# Patient Record
Sex: Female | Born: 1965
Health system: Southern US, Community
[De-identification: ages and names within clinical notes are randomized; demographics above are authoritative.]

## PROBLEM LIST (undated history)

## (undated) DIAGNOSIS — N1831 Chronic kidney disease, stage 3a: Secondary | ICD-10-CM

## (undated) DIAGNOSIS — I10 Essential (primary) hypertension: Secondary | ICD-10-CM

## (undated) DIAGNOSIS — R112 Nausea with vomiting, unspecified: Secondary | ICD-10-CM

## (undated) DIAGNOSIS — I480 Paroxysmal atrial fibrillation: Secondary | ICD-10-CM

## (undated) DIAGNOSIS — T8859XA Other complications of anesthesia, initial encounter: Secondary | ICD-10-CM

## (undated) DIAGNOSIS — J45909 Unspecified asthma, uncomplicated: Secondary | ICD-10-CM

## (undated) DIAGNOSIS — R809 Proteinuria, unspecified: Secondary | ICD-10-CM

## (undated) DIAGNOSIS — T4145XA Adverse effect of unspecified anesthetic, initial encounter: Secondary | ICD-10-CM

## (undated) DIAGNOSIS — E1129 Type 2 diabetes mellitus with other diabetic kidney complication: Secondary | ICD-10-CM

## (undated) DIAGNOSIS — E119 Type 2 diabetes mellitus without complications: Secondary | ICD-10-CM

## (undated) HISTORY — DX: Type 2 diabetes mellitus with other diabetic kidney complication: R80.9

## (undated) HISTORY — PX: EYE SURGERY: SHX253

## (undated) HISTORY — DX: Type 2 diabetes mellitus with other diabetic kidney complication: E11.29

## (undated) HISTORY — PX: KNEE ARTHROSCOPY: SUR90

## (undated) HISTORY — DX: Chronic kidney disease, stage 3a: N18.31

---

## 1999-02-26 ENCOUNTER — Emergency Department (HOSPITAL_COMMUNITY): Admission: EM | Admit: 1999-02-26 | Discharge: 1999-02-26 | Payer: Self-pay | Admitting: Emergency Medicine

## 1999-08-05 ENCOUNTER — Encounter: Payer: Self-pay | Admitting: Emergency Medicine

## 1999-08-05 ENCOUNTER — Emergency Department (HOSPITAL_COMMUNITY): Admission: EM | Admit: 1999-08-05 | Discharge: 1999-08-05 | Payer: Self-pay | Admitting: Emergency Medicine

## 1999-11-09 ENCOUNTER — Encounter: Payer: Self-pay | Admitting: Internal Medicine

## 1999-11-09 ENCOUNTER — Inpatient Hospital Stay (HOSPITAL_COMMUNITY): Admission: EM | Admit: 1999-11-09 | Discharge: 1999-11-10 | Payer: Self-pay | Admitting: Emergency Medicine

## 1999-11-09 ENCOUNTER — Encounter: Payer: Self-pay | Admitting: Emergency Medicine

## 2000-09-23 ENCOUNTER — Other Ambulatory Visit: Admission: RE | Admit: 2000-09-23 | Discharge: 2000-09-23 | Payer: Self-pay | Admitting: Obstetrics and Gynecology

## 2001-02-21 ENCOUNTER — Inpatient Hospital Stay (HOSPITAL_COMMUNITY): Admission: AD | Admit: 2001-02-21 | Discharge: 2001-02-21 | Payer: Self-pay | Admitting: Obstetrics and Gynecology

## 2001-04-06 HISTORY — PX: TUBAL LIGATION: SHX77

## 2001-06-22 ENCOUNTER — Inpatient Hospital Stay (HOSPITAL_COMMUNITY): Admission: AD | Admit: 2001-06-22 | Discharge: 2001-06-25 | Payer: Self-pay | Admitting: Obstetrics and Gynecology

## 2001-06-23 ENCOUNTER — Encounter: Payer: Self-pay | Admitting: Obstetrics and Gynecology

## 2001-07-12 ENCOUNTER — Encounter: Payer: Self-pay | Admitting: Obstetrics and Gynecology

## 2001-07-12 ENCOUNTER — Encounter (HOSPITAL_COMMUNITY): Admission: RE | Admit: 2001-07-12 | Discharge: 2001-07-12 | Payer: Self-pay | Admitting: Obstetrics and Gynecology

## 2001-07-19 ENCOUNTER — Inpatient Hospital Stay (HOSPITAL_COMMUNITY): Admission: AD | Admit: 2001-07-19 | Discharge: 2001-07-22 | Payer: Self-pay | Admitting: Obstetrics and Gynecology

## 2001-08-25 ENCOUNTER — Other Ambulatory Visit: Admission: RE | Admit: 2001-08-25 | Discharge: 2001-08-25 | Payer: Self-pay | Admitting: Obstetrics and Gynecology

## 2007-04-22 ENCOUNTER — Ambulatory Visit (HOSPITAL_BASED_OUTPATIENT_CLINIC_OR_DEPARTMENT_OTHER): Admission: RE | Admit: 2007-04-22 | Discharge: 2007-04-22 | Payer: Self-pay | Admitting: Orthopedic Surgery

## 2009-05-28 ENCOUNTER — Emergency Department (HOSPITAL_COMMUNITY): Admission: EM | Admit: 2009-05-28 | Discharge: 2009-05-28 | Payer: Self-pay | Admitting: Emergency Medicine

## 2010-06-25 LAB — POCT CARDIAC MARKERS
CKMB, poc: <1 ng/mL — ABNORMAL LOW (ref 1.0–8.0)
CKMB, poc: <1 ng/mL — ABNORMAL LOW (ref 1.0–8.0)
Myoglobin, poc: 24.3 ng/mL (ref 12–200)
Myoglobin, poc: 41.3 ng/mL (ref 12–200)
Troponin i, poc: <0.05 ng/mL (ref 0.00–0.09)
Troponin i, poc: <0.05 ng/mL (ref 0.00–0.09)

## 2010-06-25 LAB — D-DIMER, QUANTITATIVE: D-Dimer, Quant: <0.22 ug/mL-FEU (ref 0.00–0.48)

## 2010-08-19 NOTE — Op Note (Signed)
Dawn Braun, Dawn Braun           ACCOUNT NO.:  [id number]   MEDICAL RECORD NO.:  [id number]          PATIENT TYPE:  AMB   LOCATION:  DSC                          FACILITY:  [facility]   PHYSICIAN:  Harvie Junior, M.D.   DATE OF BIRTH:  01-Nov-1965   DATE OF PROCEDURE:  04/22/2007  DATE OF DISCHARGE:                               OPERATIVE REPORT   PREOPERATIVE DIAGNOSIS:  Medial meniscal tear with suspected tight  lateral retinaculum.   POSTOPERATIVE DIAGNOSES:  1. Medial meniscal tear with suspected tight lateral retinaculum.  2. Chondromalacia, lateral patellar facet.  3. Medial plica.   PROCEDURE:  1. Partial posterior horn medial meniscectomy with corresponding      debridement in the medial femoral compartment.  2. Chondroplasty of the lateral patellar facet.  3. Lateral retinacular release.  4. Debridement of medial shelf plica.   SURGEON:  Harvie Junior, M.D.   ASSISTANT:  Marshia Ly, P.A.   ANESTHESIA:  General.   BRIEF HISTORY:  Ms. Corkern is a 45 year old female with a long history  of having had significant right knee pain.  We had treated her  conservatively for a prolonged period of time because of failure of all  conservative care.  She is ultimately taken to the operating room for  operative knee arthroscopy, debridement of meniscal tear, and other as  needed.   PROCEDURE:  Patient was taken to the operating room.  After adequate  anesthesia was obtained with general anesthetic, the patient was placed  supine on the operating room table.  The right leg was prepped and  draped in the usual sterile fashion.  Following that, routine  arthroscopic examination of the knee revealed there was an obvious  posterior horn medial meniscal tear which was debrided back to a smooth  and stable rim.  Attention was turned to the medial femoral condyle,  where there were grade 2 changes in the medial femoral condyle, which  were debrided in that compartment.  Attention  was turned up to the  patellofemoral joint.  There was definitely lateral patellar tracking,  but the mid portion of the patella looked pretty good.  The lateral  retinaculum was tight, and we did a lateral retinacular release.  This  gave a little better access to the lateral patellar facet, which did  have chondromalacia, which was debrided.  There was a large medial shelf  plica which was debrided in the medial compartment, and the lateral  compartment was pristine, ACL pristine.  At this point, the knee was  copiously and thoroughly irrigated and suctioned dry.  The arthroscopic  portals were closed to the bandage.  Sterile compressive dressing was  applied.  The patient was taken to the recovery room where she noted to  be in satisfactory condition.  During the procedure, a lateral  retinacular release was performed with the arthroscopic Bovie from three  finger breadths proximal to the patella down to the joint line.  Again,  as described, the knee was thoroughly irrigated and  suctioned dry.  The arthroscopic portals were closed with a bandage, and  sterile compressive dressing was  applied.  Patient was taken to the  recovery room and noted to be in satisfactory condition.  Estimated  blood loss for the procedure was __________ .      Harvie Junior, M.D.  Electronically Signed     JLG/MEDQ  D:  04/22/2007  T:  04/22/2007  Job:  [id number]

## 2010-12-25 LAB — BASIC METABOLIC PANEL
BUN: 9
CO2: 22
Calcium: 8.7
Chloride: 104
Creatinine, Ser: 0.73
GFR calc Af Amer: >60
GFR calc non Af Amer: >60
Glucose, Bld: 207 — ABNORMAL HIGH
Potassium: 4.2
Sodium: 134 — ABNORMAL LOW

## 2010-12-25 LAB — POCT HEMOGLOBIN-HEMACUE: Hemoglobin: 12.4

## 2015-05-09 ENCOUNTER — Emergency Department (HOSPITAL_COMMUNITY): Payer: 59

## 2015-05-09 ENCOUNTER — Other Ambulatory Visit: Payer: Self-pay

## 2015-05-09 ENCOUNTER — Encounter (HOSPITAL_COMMUNITY): Payer: Self-pay | Admitting: Emergency Medicine

## 2015-05-09 ENCOUNTER — Emergency Department (HOSPITAL_COMMUNITY)
Admission: EM | Admit: 2015-05-09 | Discharge: 2015-05-09 | Disposition: A | Discharge status: Home / Self Care | Payer: 59 | Attending: Emergency Medicine | Admitting: Emergency Medicine

## 2015-05-09 DIAGNOSIS — R0602 Shortness of breath: Secondary | ICD-10-CM | POA: Diagnosis present

## 2015-05-09 DIAGNOSIS — I4891 Unspecified atrial fibrillation: Secondary | ICD-10-CM | POA: Diagnosis not present

## 2015-05-09 DIAGNOSIS — R55 Syncope and collapse: Secondary | ICD-10-CM | POA: Diagnosis not present

## 2015-05-09 DIAGNOSIS — E119 Type 2 diabetes mellitus without complications: Secondary | ICD-10-CM | POA: Diagnosis not present

## 2015-05-09 DIAGNOSIS — Z7984 Long term (current) use of oral hypoglycemic drugs: Secondary | ICD-10-CM | POA: Insufficient documentation

## 2015-05-09 DIAGNOSIS — J45901 Unspecified asthma with (acute) exacerbation: Secondary | ICD-10-CM | POA: Insufficient documentation

## 2015-05-09 HISTORY — DX: Unspecified asthma, uncomplicated: J45.909

## 2015-05-09 LAB — CBC
HCT: 38.5 % (ref 36.0–46.0)
Hemoglobin: 13.1 g/dL (ref 12.0–15.0)
MCH: 29.2 pg (ref 26.0–34.0)
MCHC: 34 g/dL (ref 30.0–36.0)
MCV: 85.7 fL (ref 78.0–100.0)
Platelets: 330 10*3/uL (ref 150–400)
RBC: 4.49 MIL/uL (ref 3.87–5.11)
RDW: 12.9 % (ref 11.5–15.5)
WBC: 8.6 10*3/uL (ref 4.0–10.5)

## 2015-05-09 LAB — BASIC METABOLIC PANEL
Anion gap: 9 (ref 5–15)
BUN: 19 mg/dL (ref 6–20)
CO2: 20 mmol/L — ABNORMAL LOW (ref 22–32)
Calcium: 8.7 mg/dL — ABNORMAL LOW (ref 8.9–10.3)
Chloride: 110 mmol/L (ref 101–111)
Creatinine, Ser: 0.69 mg/dL (ref 0.44–1.00)
GFR calc Af Amer: >60 mL/min (ref >60)
GFR calc non Af Amer: >60 mL/min (ref >60)
Glucose, Bld: 210 mg/dL — ABNORMAL HIGH (ref 65–99)
Potassium: 4.1 mmol/L (ref 3.5–5.1)
Sodium: 139 mmol/L (ref 135–145)

## 2015-05-09 LAB — I-STAT TROPONIN, ED: Troponin i, poc: 0.03 ng/mL (ref 0.00–0.08)

## 2015-05-09 LAB — PROTIME-INR
INR: 1.01 (ref 0.00–1.49)
Prothrombin Time: 13.5 seconds (ref 11.6–15.2)

## 2015-05-09 LAB — MAGNESIUM: Magnesium: 1.7 mg/dL (ref 1.7–2.4)

## 2015-05-09 MED ORDER — DILTIAZEM HCL ER COATED BEADS 120 MG PO CP24: 120.0000 mg | ORAL_CAPSULE | Freq: Every day | ORAL | Status: DC

## 2015-05-09 MED ORDER — DILTIAZEM HCL 25 MG/5ML IV SOLN
10.0000 mg | Freq: Once | INTRAVENOUS | Status: AC
  Administered 2015-05-09: 10 mg via INTRAVENOUS
  Filled 2015-05-09: qty 5

## 2015-05-09 MED ORDER — APIXABAN 5 MG PO TABS: 5.0000 mg | ORAL_TABLET | Freq: Two times a day (BID) | ORAL | Status: DC

## 2015-05-09 NOTE — ED Provider Notes (Signed)
CSN: [id number]     Arrival date & time 05/09/15  4098 History   First MD Initiated Contact with Patient 05/09/15 [phone]     Chief Complaint  Patient presents with  . Shortness of Breath  . Near Syncope    HPI  patient presents to the emergency room for evaluation of palpitations and shortness of breath.   Patient states she noted the symptoms this morning. It woke her up from sleep. He felt like her heart was racing and was irregular. She also was feeling lightheaded and short of breath.   She had a similar event in the past that resolved on its own so she never was evaluated for it.   Patient denies any trouble with fevers or coughing. No history of heart or lung problems. Past Medical History  Diagnosis Date  . Diabetes mellitus without complication (HCC)     type 2  . Asthma    History reviewed. No pertinent past surgical history. History reviewed. No pertinent family history. Social History  Substance Use Topics  . Smoking status: Never Smoker   . Smokeless tobacco: None  . Alcohol Use: Yes     Comment: occasional   OB History    No data available     Review of Systems  All other systems reviewed and are negative.     Allergies  Review of patient's allergies indicates no known allergies.  Home Medications   Prior to Admission medications   Medication Sig Start Date End Date Taking? Authorizing Provider  sitaGLIPtin-metformin (JANUMET) 50-500 MG tablet Take 1 tablet by mouth daily.   Yes Historical Provider, MD   BP 116/69 mmHg  Pulse 116  Temp(Src) 97.4 F (36.3 C) (Oral)  Resp 18  SpO2 100%  LMP 04/25/2015 Physical Exam  Constitutional: She appears well-developed and well-nourished. No distress.  HENT:  Head: Normocephalic and atraumatic.  Right Ear: External ear normal.  Left Ear: External ear normal.  Eyes: Conjunctivae are normal. Right eye exhibits no discharge. Left eye exhibits no discharge. No scleral icterus.  Neck: Neck supple. No tracheal  deviation present.  Cardiovascular: Intact distal pulses.  An irregularly irregular rhythm present. Tachycardia present.   Pulmonary/Chest: Effort normal and breath sounds normal. No stridor. No respiratory distress. She has no wheezes. She has no rales.  Abdominal: Soft. Bowel sounds are normal. She exhibits no distension. There is no tenderness. There is no rebound and no guarding.  Musculoskeletal: She exhibits no edema or tenderness.  Neurological: She is alert. She has normal strength. No cranial nerve deficit (no facial droop, extraocular movements intact, no slurred speech) or sensory deficit. She exhibits normal muscle tone. She displays no seizure activity. Coordination normal.  Skin: Skin is warm and dry. No rash noted.  Psychiatric: She has a normal mood and affect.  Nursing note and vitals reviewed.   ED Course  Procedures (including critical care time) Labs Review Labs Reviewed  BASIC METABOLIC PANEL - Abnormal; Notable for the following:    CO2 20 (*)    Glucose, Bld 210 (*)    Calcium 8.7 (*)    All other components within normal limits  CBC  PROTIME-INR  MAGNESIUM  I-STAT TROPOININ, ED    Imaging Review Dg Chest 2 View  05/09/2015  CLINICAL DATA:  New onset dizziness. Shortness of breath. Cardiac palpitations. EXAM: CHEST  2 VIEW COMPARISON:  05/28/2009 FINDINGS: Mild enlargement of the cardiopericardial silhouette with cardiothoracic index 55%. No edema. Airway thickening is present, suggesting bronchitis  or reactive airways disease. No airspace opacity or pleural effusion. IMPRESSION: 1. Airway thickening is present, suggesting bronchitis or reactive airways disease. 2. Mild enlargement of the cardiopericardial silhouette, without edema. Electronically Signed   By: Gaylyn Rong M.D.   On: 05/09/2015 09:43   I have personally reviewed and evaluated these images and lab results as part of my medical decision-making.   EKG Interpretation   Date/Time:  Thursday  May 09 2015 09:25:48 EST Ventricular Rate:  127 PR Interval:    QRS Duration: 82 QT Interval:  324 QTC Calculation: 471 R Axis:   114 Text Interpretation:  Atrial fibrillation Ventricular premature complex  Right axis deviation Low voltage, precordial leads atrial fibrillation is  new from prior tracing Confirmed by Coury Grieger  MD-J, Mareon Robinette ([id number]) on 05/09/2015  10:37:15 AM     Medications  diltiazem (CARDIZEM) injection 10 mg (10 mg Intravenous Given 05/09/15 1057)   Filed Vitals:   05/09/15 1130 05/09/15 1200  BP: 116/98 118/93  Pulse: 66 81  Temp:    Resp: 7 14    MDM   Final diagnoses:  Atrial fibrillation, unspecified type (HCC)    Pt has new onset a fib.  Chads2Vasc2 = 2. D/w Dr Eldridge Dace.  OK for outpatient follow in a fib clinic.   Start cardizem cd 120 mg.  Start eliquis 5 mg BID   discussed findings and plan with the patient. She would follow up at the [facility] clinic.    Linwood Dibbles, MD 05/09/15 [phone]

## 2015-05-09 NOTE — Discharge Instructions (Signed)
Atrial Fibrillation °Atrial fibrillation is a type of heartbeat that is irregular or fast (rapid). If you have this condition, your heart keeps quivering in a weird (chaotic) way. This condition can make it so your heart cannot pump blood normally. Having this condition gives a person more risk for stroke, heart failure, and other heart problems. There are different types of atrial fibrillation. Talk with your doctor to learn about the type that you have. °HOME CARE °· Take over-the-counter and prescription medicines only as told by your doctor. °· If your doctor prescribed a blood-thinning medicine, take it exactly as told. Taking too much of it can cause bleeding. If you do not take enough of it, you will not have the protection that you need against stroke and other problems. °· Do not use any tobacco products. These include cigarettes, chewing tobacco, and e-cigarettes. If you need help quitting, ask your doctor. °· If you have apnea (obstructive sleep apnea), manage it as told by your doctor. °· Do not drink alcohol. °· Do not drink beverages that have caffeine. These include coffee, soda, and tea. °· Maintain a healthy weight. Do not use diet pills unless your doctor says they are safe for you. Diet pills may make heart problems worse. °· Follow diet instructions as told by your doctor. °· Exercise regularly as told by your doctor. °· Keep all follow-up visits as told by your doctor. This is important. °GET HELP IF: °· You notice a change in the speed, rhythm, or strength of your heartbeat. °· You are taking a blood-thinning medicine and you notice more bruising. °· You get tired more easily when you move or exercise. °GET HELP RIGHT AWAY IF: °· You have pain in your chest or your belly (abdomen). °· You have sweating or weakness. °· You feel sick to your stomach (nauseous). °· You notice blood in your throw up (vomit), poop (stool), or pee (urine). °· You are short of breath. °· You suddenly have swollen feet  and ankles. °· You feel dizzy. °· Your suddenly get weak or numb in your face, arms, or legs, especially if it happens on one side of your body. °· You have trouble talking, trouble understanding, or both. °· Your face or your eyelid droops on one side. °These symptoms may be an emergency. Do not wait to see if the symptoms will go away. Get medical help right away. Call your local emergency services (911 in the [place] Do not drive yourself to the hospital. °  °This information is not intended to replace advice given to you by your health care provider. Make sure you discuss any questions you have with your health care provider. °  °Document Released: 12/31/2007 Document Revised: 12/12/2014 Document Reviewed: 07/18/2014 °Elsevier Interactive Patient Education ©2016 [employer] ° °

## 2015-05-09 NOTE — ED Notes (Signed)
Pt c/o dizziness, SOB, chest palpitations onset this morning. Pt is laboring to breath. Lung sounds clear.

## 2015-05-09 NOTE — ED Notes (Signed)
Patient transported to X-ray 

## 2015-05-10 ENCOUNTER — Telehealth (HOSPITAL_COMMUNITY): Payer: Self-pay | Admitting: *Deleted

## 2015-05-10 NOTE — Telephone Encounter (Signed)
Multiple attempts made to contact patient regarding follow up appointment from ER. No voicemail available to leave message. Will continue to try and contact patient for appropriate follow up.

## 2015-05-14 ENCOUNTER — Ambulatory Visit (HOSPITAL_COMMUNITY)
Admission: RE | Admit: 2015-05-14 | Discharge: 2015-05-14 | Disposition: A | Discharge status: Home / Self Care | Payer: 59 | Source: Ambulatory Visit | Attending: Nurse Practitioner | Admitting: Nurse Practitioner

## 2015-05-14 VITALS — BP 120/80 | HR 72

## 2015-05-14 DIAGNOSIS — E119 Type 2 diabetes mellitus without complications: Secondary | ICD-10-CM | POA: Insufficient documentation

## 2015-05-14 DIAGNOSIS — Z7901 Long term (current) use of anticoagulants: Secondary | ICD-10-CM | POA: Insufficient documentation

## 2015-05-14 DIAGNOSIS — I480 Paroxysmal atrial fibrillation: Secondary | ICD-10-CM | POA: Diagnosis not present

## 2015-05-14 DIAGNOSIS — J45909 Unspecified asthma, uncomplicated: Secondary | ICD-10-CM | POA: Diagnosis not present

## 2015-05-14 MED ORDER — APIXABAN 5 MG PO TABS: 5.0000 mg | ORAL_TABLET | Freq: Two times a day (BID) | ORAL | Status: DC

## 2015-05-14 MED ORDER — DILTIAZEM HCL ER COATED BEADS 120 MG PO CP24: 120.0000 mg | ORAL_CAPSULE | Freq: Every day | ORAL | Status: DC

## 2015-05-15 ENCOUNTER — Encounter (HOSPITAL_COMMUNITY): Payer: Self-pay | Admitting: Nurse Practitioner

## 2015-05-15 NOTE — Progress Notes (Signed)
Patient ID: Dawn Braun, female   DOB: [birth year], 50 y.o.   MRN: [id number]     Primary Care Physician: No primary care provider on file. Referring Physician: ER f/u   Dawn Braun is a 50 y.o. female with a h/o PAF that newly diagnosed in the ER 2/2. She has had two previous epiosed of fast heart bet associated with shortness of breath and fatigue over the last few months. She converted with IV cardizem and d/c home on 120 mg cardizem daily and  apixaban for chadsvasc score of 2(female and DM).  She has not had reoccurrence of afib. She is being complinat with cardizem and apixaban. She does not drink alcohol, cutting down on caffeine use, no tobacco, recent weight gain from diabetes meds around 6 lbs, but DM meds just adjusted. She does snore and wakes up with morning headaches and has daytime somnolence so will order sleep study. No regular exercise program.  Today, she denies symptoms of palpitations, chest pain, shortness of breath, orthopnea, PND, lower extremity edema, dizziness, presyncope, syncope, or neurologic sequela. The patient is tolerating medications without difficulties and is otherwise without complaint today.   Past Medical History  Diagnosis Date  . Diabetes mellitus without complication (HCC)     type 2  . Asthma    No past surgical history on file.  Current Outpatient Prescriptions  Medication Sig Dispense Refill  . apixaban (ELIQUIS) 5 MG TABS tablet Take 1 tablet (5 mg total) by mouth 2 (two) times daily. 60 tablet 6  . diltiazem (CARDIZEM CD) 120 MG 24 hr capsule Take 1 capsule (120 mg total) by mouth daily. 30 capsule 6  . glimepiride (AMARYL) 2 MG tablet Take 2 mg by mouth daily with breakfast.    . linagliptin (TRADJENTA) 5 MG TABS tablet Take 5 mg by mouth daily.     No current facility-administered medications for this encounter.    No Known Allergies  Social History   Social History  . Marital Status: Single    Spouse Name: N/A  .  Number of Children: N/A  . Years of Education: N/A   Occupational History  . Not on file.   Social History Main Topics  . Smoking status: Never Smoker   . Smokeless tobacco: Not on file  . Alcohol Use: Yes     Comment: occasional  . Drug Use: No  . Sexual Activity: Not on file   Other Topics Concern  . Not on file   Social History Narrative    No family history on file.  ROS- All systems are reviewed and negative except as per the HPI above  Physical Exam: Filed Vitals:   05/14/15 0930 05/15/15 0714  BP: 120/80   Pulse: 72 72    GEN- The patient is well appearing, alert and oriented x 3 today.   Head- normocephalic, atraumatic Eyes-  Sclera clear, conjunctiva pink Ears- hearing intact Oropharynx- clear Neck- supple, no JVP Lymph- no cervical lymphadenopathy Lungs- Clear to ausculation bilaterally, normal work of breathing Heart- Regular rate and rhythm, no murmurs, rubs or gallops, PMI not laterally displaced GI- soft, NT, ND, + BS Extremities- no clubbing, cyanosis, or edema MS- no significant deformity or atrophy Skin- no rash or lesion Psych- euthymic mood, full affect Neuro- strength and sensation are intact  EKG- NSR at 72 bpm, Pr int 156 ms, Qrs int 76 ms, Qtc 429 ms Epic records reviewed  Assessment and Plan: 1. PAF Continue cardizem Continue apixaban  2. Lifestyle risk for afib Snoring-Sleep study ordered Develop regular exercise program Aim for 5 to 10% weight loss  F/u in one month Consider obtaining echo on recheck  Lupita Leash [facility] [address] [place], [place] [address] [phone]

## 2015-05-20 ENCOUNTER — Encounter: Payer: Self-pay | Admitting: *Deleted

## 2015-05-20 ENCOUNTER — Telehealth: Payer: Self-pay | Admitting: *Deleted

## 2015-05-20 NOTE — Telephone Encounter (Signed)
Precert Started for Sleep Study    Ref # [id number]

## 2015-06-03 ENCOUNTER — Other Ambulatory Visit: Payer: Self-pay | Admitting: *Deleted

## 2015-06-03 ENCOUNTER — Other Ambulatory Visit (HOSPITAL_COMMUNITY): Payer: Self-pay | Admitting: *Deleted

## 2015-06-03 DIAGNOSIS — I480 Paroxysmal atrial fibrillation: Secondary | ICD-10-CM

## 2015-06-03 DIAGNOSIS — R0683 Snoring: Secondary | ICD-10-CM

## 2015-06-03 MED ORDER — DILTIAZEM HCL ER COATED BEADS 120 MG PO CP24: 120.0000 mg | ORAL_CAPSULE | Freq: Every day | ORAL | Status: DC

## 2015-06-03 NOTE — Telephone Encounter (Signed)
Pt called in for refills on cardizem because she was about out and mentioned that she had plenty of eliquis because she was only taking it once a day due to feeling "floaty". Educated patient on correct directions of taking Eliquis otherwise she is not being protected from stroke if only taking once a day.  Instructed patient to try and take cardizem at bedtime instead of morning and see if that helps with her symptoms but if they continue to call back and we may need to make adjustments to medications. Patient verbalized understanding.

## 2015-06-10 ENCOUNTER — Inpatient Hospital Stay (HOSPITAL_COMMUNITY): Admission: RE | Admit: 2015-06-10 | Payer: 59 | Source: Ambulatory Visit | Admitting: Nurse Practitioner

## 2015-06-13 ENCOUNTER — Ambulatory Visit (HOSPITAL_COMMUNITY)
Admission: RE | Admit: 2015-06-13 | Discharge: 2015-06-13 | Disposition: A | Discharge status: Home / Self Care | Payer: 59 | Source: Ambulatory Visit | Attending: Nurse Practitioner | Admitting: Nurse Practitioner

## 2015-06-13 VITALS — BP 126/72 | HR 75 | Ht 63.0 in | Wt 192.2 lb

## 2015-06-13 DIAGNOSIS — Z7901 Long term (current) use of anticoagulants: Secondary | ICD-10-CM | POA: Diagnosis not present

## 2015-06-13 DIAGNOSIS — Z79899 Other long term (current) drug therapy: Secondary | ICD-10-CM | POA: Insufficient documentation

## 2015-06-13 DIAGNOSIS — I480 Paroxysmal atrial fibrillation: Secondary | ICD-10-CM

## 2015-06-13 DIAGNOSIS — E119 Type 2 diabetes mellitus without complications: Secondary | ICD-10-CM | POA: Insufficient documentation

## 2015-06-13 DIAGNOSIS — Z7984 Long term (current) use of oral hypoglycemic drugs: Secondary | ICD-10-CM | POA: Diagnosis not present

## 2015-06-13 DIAGNOSIS — R0683 Snoring: Secondary | ICD-10-CM | POA: Insufficient documentation

## 2015-06-13 DIAGNOSIS — I4891 Unspecified atrial fibrillation: Secondary | ICD-10-CM | POA: Diagnosis present

## 2015-06-13 NOTE — Progress Notes (Signed)
Patient ID: Dawn Braun, female   DOB: 06-26-1965, 50 y.o.   MRN: [id number]     Primary Care Physician: No primary care provider on file. Referring Physician: ER f/u   Dawn Braun is a 50 y.o. female with a h/o PAF that newly diagnosed in the ER 2/2. She has had two previous episodes of fast heart beat associated with shortness of breath and fatigue over the last few months. She converted with IV cardizem and d/c home on 120 mg cardizem daily and  apixaban for chadsvasc score of 2(female and DM).  She has not had reoccurrence of afib. She is being complinat with cardizem and apixaban. She does not drink alcohol, cutting down on caffeine use, no tobacco, recent weight gain from diabetes meds around 6 lbs, but DM meds just adjusted. She does snore and wakes up with morning headaches and has daytime somnolence so will order sleep study. No regular exercise program.  Returns 3/9. She is doing well. No further afib. She is tolerating blood thinner, no bleeding issues, has noted that she is more cold now on blood thinner.Sleep study is pending early May for snoring history.   Today, she denies symptoms of palpitations, chest pain, shortness of breath, orthopnea, PND, lower extremity edema, dizziness, presyncope, syncope, or neurologic sequela. The patient is tolerating medications without difficulties and is otherwise without complaint today.   Past Medical History  Diagnosis Date  . Diabetes mellitus without complication (HCC)     type 2  . Asthma    No past surgical history on file.  Current Outpatient Prescriptions  Medication Sig Dispense Refill  . apixaban (ELIQUIS) 5 MG TABS tablet Take 1 tablet (5 mg total) by mouth 2 (two) times daily. 60 tablet 6  . diltiazem (CARDIZEM CD) 120 MG 24 hr capsule Take 1 capsule (120 mg total) by mouth daily. 30 capsule 6  . glimepiride (AMARYL) 2 MG tablet Take 2 mg by mouth daily with breakfast.    . linagliptin (TRADJENTA) 5 MG TABS  tablet Take 5 mg by mouth daily.     No current facility-administered medications for this encounter.    No Known Allergies  Social History   Social History  . Marital Status: Single    Spouse Name: N/A  . Number of Children: N/A  . Years of Education: N/A   Occupational History  . Not on file.   Social History Main Topics  . Smoking status: Never Smoker   . Smokeless tobacco: Not on file  . Alcohol Use: Yes     Comment: occasional  . Drug Use: No  . Sexual Activity: Not on file   Other Topics Concern  . Not on file   Social History Narrative    No family history on file.  ROS- All systems are reviewed and negative except as per the HPI above  Physical Exam: Filed Vitals:   06/13/15 0848  BP: 126/72  Pulse: 75  Height: 5\' 3"  (1.6 m)  Weight: 192 lb 3.2 oz (87.181 kg)    GEN- The patient is well appearing, alert and oriented x 3 today.   Head- normocephalic, atraumatic Eyes-  Sclera clear, conjunctiva pink Ears- hearing intact Oropharynx- clear Neck- supple, no JVP Lymph- no cervical lymphadenopathy Lungs- Clear to ausculation bilaterally, normal work of breathing Heart- Regular rate and rhythm, no murmurs, rubs or gallops, PMI not laterally displaced GI- soft, NT, ND, + BS Extremities- no clubbing, cyanosis, or edema MS- no significant deformity or  atrophy Skin- no rash or lesion Psych- euthymic mood, full affect Neuro- strength and sensation are intact  EKG- NSR with rare PVC, LAFB,  pr int 162 ms, qrs int 78 ms, qtc 437 ms Epic records reviewed  Assessment and Plan: 1. PAF Continue cardizem Continue apixaban  2. Lifestyle risk for afib Snoring-Sleep study ordered Develop regular exercise program Aim for 5 to 10% weight loss  Will determine f/u when sleep study results are reviewed  Lupita Leash [facility] [address] [place], [place] [address] [phone]

## 2015-07-01 ENCOUNTER — Ambulatory Visit (HOSPITAL_COMMUNITY)
Admission: RE | Admit: 2015-07-01 | Discharge: 2015-07-01 | Disposition: A | Discharge status: Home / Self Care | Payer: 59 | Source: Ambulatory Visit | Attending: Nurse Practitioner | Admitting: Nurse Practitioner

## 2015-07-01 ENCOUNTER — Encounter (HOSPITAL_COMMUNITY): Payer: Self-pay | Admitting: Nurse Practitioner

## 2015-07-01 ENCOUNTER — Telehealth (HOSPITAL_COMMUNITY): Payer: Self-pay | Admitting: *Deleted

## 2015-07-01 VITALS — BP 100/62 | HR 89

## 2015-07-01 DIAGNOSIS — Z79899 Other long term (current) drug therapy: Secondary | ICD-10-CM | POA: Diagnosis not present

## 2015-07-01 DIAGNOSIS — Z7901 Long term (current) use of anticoagulants: Secondary | ICD-10-CM | POA: Diagnosis not present

## 2015-07-01 DIAGNOSIS — I480 Paroxysmal atrial fibrillation: Secondary | ICD-10-CM

## 2015-07-01 DIAGNOSIS — Z7984 Long term (current) use of oral hypoglycemic drugs: Secondary | ICD-10-CM | POA: Diagnosis not present

## 2015-07-01 DIAGNOSIS — R0683 Snoring: Secondary | ICD-10-CM | POA: Insufficient documentation

## 2015-07-01 DIAGNOSIS — J45909 Unspecified asthma, uncomplicated: Secondary | ICD-10-CM | POA: Diagnosis not present

## 2015-07-01 DIAGNOSIS — E119 Type 2 diabetes mellitus without complications: Secondary | ICD-10-CM | POA: Diagnosis not present

## 2015-07-01 LAB — COMPREHENSIVE METABOLIC PANEL
ALT: 19 U/L (ref 14–54)
AST: 20 U/L (ref 15–41)
Albumin: 3.1 g/dL — ABNORMAL LOW (ref 3.5–5.0)
Alkaline Phosphatase: 88 U/L (ref 38–126)
Anion gap: 7 (ref 5–15)
BUN: 24 mg/dL — ABNORMAL HIGH (ref 6–20)
CO2: 24 mmol/L (ref 22–32)
Calcium: 9.2 mg/dL (ref 8.9–10.3)
Chloride: 109 mmol/L (ref 101–111)
Creatinine, Ser: 0.77 mg/dL (ref 0.44–1.00)
GFR calc Af Amer: >60 mL/min (ref >60)
GFR calc non Af Amer: >60 mL/min (ref >60)
Glucose, Bld: 190 mg/dL — ABNORMAL HIGH (ref 65–99)
Potassium: 4.1 mmol/L (ref 3.5–5.1)
Sodium: 140 mmol/L (ref 135–145)
Total Bilirubin: 0.5 mg/dL (ref 0.3–1.2)
Total Protein: 7.1 g/dL (ref 6.5–8.1)

## 2015-07-01 LAB — MAGNESIUM: Magnesium: 1.8 mg/dL (ref 1.7–2.4)

## 2015-07-01 LAB — TSH: TSH: 0.863 u[IU]/mL (ref 0.350–4.500)

## 2015-07-01 MED ORDER — DILTIAZEM HCL 30 MG PO TABS: ORAL_TABLET | ORAL | Status: DC

## 2015-07-01 NOTE — Patient Instructions (Signed)
Your physician has recommended you make the following change in your medication:  Cardizem 30mg  -- take 1 tablet by mouth every 4 hours AS NEEDED for heart rate >100 as long as blood pressure >100.   Kennyth ArnoldStacy will call to schedule echocardiogram and follow up appt

## 2015-07-01 NOTE — Progress Notes (Signed)
Patient ID: Dawn ComaMichelle L Brostrom, female   DOB: 07/26/1965, 50 y.o.   MRN: [id number]     Primary Care Physician: No primary care provider on file. Referring Physician: ER f/u   Dawn Braun is a 50 y.o. female with a h/o PAF that newly diagnosed in the ER 2/2. She has had two previous epiosed of fast heart bet associated with shortness of breath and fatigue over the last few months. She converted with IV cardizem and d/c home on 120 mg cardizem daily and  apixaban for chadsvasc score of 2(female and DM).  She has not had reoccurrence of afib. She is being complinat with cardizem and apixaban. She does not drink alcohol, cutting down on caffeine use, no tobacco, recent weight gain from diabetes meds around 6 lbs, but DM meds just adjusted. She does snore and wakes up with morning headaches and has daytime somnolence so will order sleep study. No regular exercise program.  She called the office 3/27 and reported irregular heart beat that was present when she woke up.  It had been going on around 3-4 hours when she called the office and was instructed to take another cardizem 120 mg. She was given an appointment with afib clinic at 2:30pm, but she came in around 11:30 am because she felt bad. She had converted to SR by the time she arrived, but felt weak and washed out. BP around 100 systolic. She can not identify a trigger but is pending a sleep study in May with probable sleep apnea.  Today, she denies symptoms of palpitations, chest pain, shortness of breath, orthopnea, PND, lower extremity edema, dizziness, presyncope, syncope, or neurologic sequela. The patient is tolerating medications without difficulties and is otherwise without complaint today.   Past Medical History  Diagnosis Date  . Diabetes mellitus without complication (HCC)     type 2  . Asthma    No past surgical history on file.  Current Outpatient Prescriptions  Medication Sig Dispense Refill  . apixaban (ELIQUIS) 5 MG  TABS tablet Take 1 tablet (5 mg total) by mouth 2 (two) times daily. 60 tablet 6  . diltiazem (CARDIZEM CD) 120 MG 24 hr capsule Take 1 capsule (120 mg total) by mouth daily. 30 capsule 6  . glimepiride (AMARYL) 2 MG tablet Take 2 mg by mouth daily with breakfast.    . linagliptin (TRADJENTA) 5 MG TABS tablet Take 5 mg by mouth daily.    [name]. diltiazem (CARDIZEM) 30 MG tablet Cardizem 30mg  -- take 1 tablet by mouth every 4 hours AS NEEDED for heart rate >100 as long as blood pressure >100. 45 tablet 3   No current facility-administered medications for this encounter.    No Known Allergies  Social History   Social History  . Marital Status: Single    Spouse Name: N/A  . Number of Children: N/A  . Years of Education: N/A   Occupational History  . Not on file.   Social History Main Topics  . Smoking status: Never Smoker   . Smokeless tobacco: Not on file  . Alcohol Use: Yes     Comment: occasional  . Drug Use: No  . Sexual Activity: Not on file   Other Topics Concern  . Not on file   Social History Narrative    No family history on file.  ROS- All systems are reviewed and negative except as per the HPI above  Physical Exam: Filed Vitals:   07/01/15 1153  BP: 100/62  Pulse: 89    GEN- The patient is well appearing, alert and oriented x 3 today.   Head- normocephalic, atraumatic Eyes-  Sclera clear, conjunctiva pink Ears- hearing intact Oropharynx- clear Neck- supple, no JVP Lymph- no cervical lymphadenopathy Lungs- Clear to ausculation bilaterally, normal work of breathing Heart- Regular rate and rhythm, no murmurs, rubs or gallops, PMI not laterally displaced GI- soft, NT, ND, + BS Extremities- no clubbing, cyanosis, or edema MS- no significant deformity or atrophy Skin- no rash or lesion Psych- euthymic mood, full affect Neuro- strength and sensation are intact  EKG- NSR at 72 bpm, Pr int 156 ms, Qrs int 76 ms, Qtc 429 ms Epic records reviewed  Assessment  and Plan: 1. PAF Continue cardizem 120 mg qd Will give 30 mg cardizem to use as needed for breakthrough afib so hopefully will not drop her BP as much and cause weakness Echo Continue apixaban Cmet, tsh mag May have to consider antiarrythmic's   2. Lifestyle risk for afib Snoring-Sleep study ordered Develop regular exercise program Weight loss advised  F/u in 2 weeks  Lupita Leash C. [facility] [address] [place], [place] [address] [phone]

## 2015-07-01 NOTE — Telephone Encounter (Signed)
Pt called in stating she can feel her heart rate racing and just not feeling well. She took her daily cardizem dose before 5am thinking that would help but it did not.  Instructed her to take an extra cardizem now and drink extra glass of water since she does not know her blood pressure (she is at work does not have cuff). Offered pt appt she will try to come at 230 today. Will call if extra cardizem reduces heart rate. Pt verbalized understanding of instructions.

## 2015-07-11 ENCOUNTER — Ambulatory Visit (HOSPITAL_COMMUNITY)
Admission: RE | Admit: 2015-07-11 | Discharge: 2015-07-11 | Disposition: A | Discharge status: Home / Self Care | Payer: 59 | Source: Ambulatory Visit | Attending: Nurse Practitioner | Admitting: Nurse Practitioner

## 2015-07-11 ENCOUNTER — Ambulatory Visit (HOSPITAL_BASED_OUTPATIENT_CLINIC_OR_DEPARTMENT_OTHER)
Admission: RE | Admit: 2015-07-11 | Discharge: 2015-07-11 | Disposition: A | Discharge status: Home / Self Care | Payer: 59 | Source: Ambulatory Visit | Attending: Nurse Practitioner | Admitting: Nurse Practitioner

## 2015-07-11 ENCOUNTER — Encounter (HOSPITAL_COMMUNITY): Payer: Self-pay | Admitting: Nurse Practitioner

## 2015-07-11 VITALS — BP 150/92 | HR 72 | Ht 63.0 in | Wt 196.6 lb

## 2015-07-11 DIAGNOSIS — J45909 Unspecified asthma, uncomplicated: Secondary | ICD-10-CM | POA: Insufficient documentation

## 2015-07-11 DIAGNOSIS — E119 Type 2 diabetes mellitus without complications: Secondary | ICD-10-CM | POA: Insufficient documentation

## 2015-07-11 DIAGNOSIS — R0683 Snoring: Secondary | ICD-10-CM | POA: Diagnosis not present

## 2015-07-11 DIAGNOSIS — Z7984 Long term (current) use of oral hypoglycemic drugs: Secondary | ICD-10-CM | POA: Insufficient documentation

## 2015-07-11 DIAGNOSIS — Z79899 Other long term (current) drug therapy: Secondary | ICD-10-CM | POA: Insufficient documentation

## 2015-07-11 DIAGNOSIS — I480 Paroxysmal atrial fibrillation: Secondary | ICD-10-CM

## 2015-07-11 DIAGNOSIS — Z7901 Long term (current) use of anticoagulants: Secondary | ICD-10-CM | POA: Insufficient documentation

## 2015-07-11 MED ORDER — APIXABAN 5 MG PO TABS: 5.0000 mg | ORAL_TABLET | Freq: Two times a day (BID) | ORAL | Status: DC

## 2015-07-11 NOTE — Progress Notes (Signed)
Patient ID: Dawn Braun, female   DOB: [birth year], 50 y.o.   MRN: [id number]     Primary Care Physician: No primary care provider on file. Referring Physician: ER f/u   Dawn ComaMichelle L Dasch is a 50 y.o. female with a h/o PAF that newly diagnosed in the ER 2/2. She has had two previous epiosodes of fast heart bet associated with shortness of breath and fatigue over the last few months. She converted with IV cardizem and d/c home on 120 mg cardizem daily and  apixaban for chadsvasc score of 2(female and DM).   She is being compliant with cardizem and apixaban. She does not drink alcohol, cutting down on caffeine use, no tobacco, recent weight gain from diabetes meds around 6 lbs, but DM meds just adjusted. She does snore and wakes up with morning headaches and has daytime somnolence so will order sleep study. No regular exercise program.  She called the office 3/27 and reported irregular heart beat that was present when she woke up.  It had been going on around 3-4 hours when she called the office and was instructed to take another cardizem 120 mg. She was given an appointment with afib clinic at 2:30pm, but she came in around 11:30 am because she felt bad. She had converted to SR by the time she arrived, but felt weak and washed out. BP around 100 systolic. She can not identify a trigger but is pending a sleep study in May with probable sleep apnea.  Returns 4/6. No further afib. Echo this am which showed basically normal structure heart. We discuss antiarrythmic's but she would like to wait to start, but if she gets another quick reoccurrence she will consider. Sleep study pending 5/4.   Today, she denies symptoms of palpitations, chest pain, shortness of breath, orthopnea, PND, lower extremity edema, dizziness, presyncope, syncope, or neurologic sequela. The patient is tolerating medications without difficulties and is otherwise without complaint today.   Past Medical History  Diagnosis  Date  . Diabetes mellitus without complication (HCC)     type 2  . Asthma    No past surgical history on file.  Current Outpatient Prescriptions  Medication Sig Dispense Refill  . apixaban (ELIQUIS) 5 MG TABS tablet Take 1 tablet (5 mg total) by mouth 2 (two) times daily. 60 tablet 11  . diltiazem (CARDIZEM CD) 120 MG 24 hr capsule Take 1 capsule (120 mg total) by mouth daily. 30 capsule 6  . diltiazem (CARDIZEM) 30 MG tablet Cardizem 30mg  -- take 1 tablet by mouth every 4 hours AS NEEDED for heart rate >100 as long as blood pressure >100. 45 tablet 3  . glimepiride (AMARYL) 2 MG tablet Take 2 mg by mouth daily with breakfast.    . linagliptin (TRADJENTA) 5 MG TABS tablet Take 5 mg by mouth daily.     No current facility-administered medications for this encounter.    No Known Allergies  Social History   Social History  . Marital Status: Single    Spouse Name: N/A  . Number of Children: N/A  . Years of Education: N/A   Occupational History  . Not on file.   Social History Main Topics  . Smoking status: Never Smoker   . Smokeless tobacco: Not on file  . Alcohol Use: Yes     Comment: occasional  . Drug Use: No  . Sexual Activity: Not on file   Other Topics Concern  . Not on file   Social History Narrative  No family history on file.  ROS- All systems are reviewed and negative except as per the HPI above  Physical Exam: Filed Vitals:   07/11/15 0946  BP: 150/92  Pulse: 72  Height:  (1.6 m)  Weight: 196 lb 9.6 oz (89.177 kg)    GEN- The patient is well appearing, alert and oriented x 3 today.   Head- normocephalic, atraumatic Eyes-  Sclera clear, conjunctiva pink Ears- hearing intact Oropharynx- clear Neck- supple, no JVP Lymph- no cervical lymphadenopathy Lungs- Clear to ausculation bilaterally, normal work of breathing Heart- Regular rate and rhythm, no murmurs, rubs or gallops, PMI not laterally displaced GI- soft, NT, ND, + BS Extremities- no  clubbing, cyanosis, or edema MS- no significant deformity or atrophy Skin- no rash or lesion Psych- euthymic mood, full affect Neuro- strength and sensation are intact  EKG- NSR at 72 bpm, Pr int 164 ms, Qrs int 78 ms, Qtc 435 ms Epic records reviewed  Assessment and Plan: 1. PAF Continue cardizem 120 mg qd Will give 30 mg cardizem to use as needed for breakthrough afib instead of 120 mg tab so hopefully will not drop her BP as much and cause weakness Continue apixaban, $10 co pay card given Discussed antiarrythmic's, pt not ready to do so   2. Lifestyle risk for afib Snoring-Sleep study pending 5/2 Develop regular exercise program Weight loss advised  F/u in 2 months  Lupita Leash C. [facility] [address] [place], [place] [address] [phone]

## 2015-07-11 NOTE — Progress Notes (Signed)
  Echocardiogram 2D Echocardiogram has been performed.  Arvil ChacoFoster, Kemyah Buser 07/11/2015, 9:37 AM

## 2015-08-06 ENCOUNTER — Ambulatory Visit (HOSPITAL_BASED_OUTPATIENT_CLINIC_OR_DEPARTMENT_OTHER): Payer: 59 | Attending: Nurse Practitioner | Admitting: Cardiology

## 2015-08-06 DIAGNOSIS — I480 Paroxysmal atrial fibrillation: Secondary | ICD-10-CM | POA: Insufficient documentation

## 2015-08-06 DIAGNOSIS — R0683 Snoring: Secondary | ICD-10-CM | POA: Insufficient documentation

## 2015-08-06 HISTORY — PX: SPLIT NIGHT STUDY: SLE1000

## 2015-08-17 ENCOUNTER — Encounter (HOSPITAL_BASED_OUTPATIENT_CLINIC_OR_DEPARTMENT_OTHER): Payer: Self-pay | Admitting: Cardiology

## 2015-08-17 ENCOUNTER — Telehealth: Payer: Self-pay | Admitting: Cardiology

## 2015-08-17 NOTE — Telephone Encounter (Signed)
Please let patient know that sleep study showed no significant sleep apnea.    

## 2015-08-17 NOTE — Procedures (Signed)
   Patient Name: Dawn Braun, Dawn Braun MRN: [id number] Study Date: 08/06/2015 Gender: Female D.O.B: 1966-04-01 Age (years): [age] Referring Provider: Newman Niponna C Carroll Interpreting Physician: Armanda Magicraci Turner MD, ABSM RPSGT: Wylie Hailavis, Rico  Weight (lbs): 190 BMI: 34 Height (inches): 63 Neck Size: 15.50  CLINICAL INFORMATION Sleep Study Type: NPSG Indication for sleep study: N/A Epworth Sleepiness Score: 9  SLEEP STUDY TECHNIQUE As per the AASM Manual for the Scoring of Sleep and Associated Events v2.3 (April 2016) with a hypopnea requiring 4% desaturations. The channels recorded and monitored were frontal, central and occipital EEG, electrooculogram (EOG), submentalis EMG (chin), nasal and oral airflow, thoracic and abdominal wall motion, anterior tibialis EMG, snore microphone, electrocardiogram, and pulse oximetry.  MEDICATIONS Patient's medications reviewed in the chart. Medications self-administered by patient during sleep study : No sleep medicine administered.  SLEEP ARCHITECTURE The study was initiated at 10:01:17 PM and ended at 4:27:57 AM. Sleep onset time was 13.0 minutes and the sleep efficiency was 92.5%. The total sleep time was 357.6 minutes. Stage REM latency was 88.5 minutes. The patient spent 5.17% of the night in stage N1 sleep, 78.33% in stage N2 sleep, 0.00% in stage N3 and 16.50% in REM. Alpha intrusion was absent. Supine sleep was 50.19%.  RESPIRATORY PARAMETERS The overall apnea/hypopnea index (AHI) was 0.0 per hour. There were 0 total apneas, including 0 obstructive, 0 central and 0 mixed apneas. There were 0 hypopneas and 1 RERAs. The AHI during Stage REM sleep was 0.0 per hour. AHI while supine was 0.0 per hour. The mean oxygen saturation was 96.52%. The minimum SpO2 during sleep was 91.00%. Moderate snoring was noted during this study.  CARDIAC DATA The 2 lead EKG demonstrated sinus rhythm. The mean heart rate was 79.98 beats per minute. Other EKG findings  include: None.  LEG MOVEMENT DATA The total PLMS were 0 with a resulting PLMS index of 0.00. Associated arousal with leg movement index was 0.0 .  IMPRESSIONS - No significant obstructive sleep apnea occurred during this study (AHI = 0.0/h). - No significant central sleep apnea occurred during this study (CAI = 0.0/h). - The patient had minimal or no oxygen desaturation during the study (Min O2 = 91.00%) - The patient snored with Moderate snoring volume. - No cardiac abnormalities were noted during this study. - Clinically significant periodic limb movements did not occur during sleep. No significant associated arousals.  DIAGNOSIS - Paroxysmal atrial fibrillation  RECOMMENDATIONS - Avoid alcohol, sedatives and other CNS depressants that may result in sleep apnea and disrupt normal sleep architecture. - Sleep hygiene should be reviewed to assess factors that may improve sleep quality. - Weight management and regular exercise should be initiated or continued if appropriate.      Armanda Magicraci Turner Diplomate, American Board of Sleep Medicine  ELECTRONICALLY SIGNED ON:  08/17/2015, 12:07 PM [facility] PH: [phone]   FX: [phone] ACCREDITED BY THE AMERICAN ACADEMY OF SLEEP MEDICINE

## 2015-08-19 NOTE — Telephone Encounter (Signed)
Patient informed of information.  Stated verbal understanding  

## 2015-09-09 ENCOUNTER — Inpatient Hospital Stay (HOSPITAL_COMMUNITY)
Admission: RE | Admit: 2015-09-09 | Discharge: 2015-09-09 | Disposition: A | Discharge status: Home / Self Care | Payer: 59 | Source: Ambulatory Visit | Attending: Nurse Practitioner | Admitting: Nurse Practitioner

## 2015-09-11 ENCOUNTER — Emergency Department (HOSPITAL_COMMUNITY)
Admission: EM | Admit: 2015-09-11 | Discharge: 2015-09-11 | Disposition: A | Discharge status: Home / Self Care | Payer: 59 | Attending: Emergency Medicine | Admitting: Emergency Medicine

## 2015-09-11 ENCOUNTER — Other Ambulatory Visit: Payer: Self-pay | Admitting: Physician Assistant

## 2015-09-11 ENCOUNTER — Emergency Department (HOSPITAL_COMMUNITY): Payer: 59

## 2015-09-11 ENCOUNTER — Encounter (HOSPITAL_COMMUNITY): Payer: Self-pay | Admitting: *Deleted

## 2015-09-11 DIAGNOSIS — I480 Paroxysmal atrial fibrillation: Secondary | ICD-10-CM

## 2015-09-11 DIAGNOSIS — E119 Type 2 diabetes mellitus without complications: Secondary | ICD-10-CM | POA: Diagnosis not present

## 2015-09-11 DIAGNOSIS — J45909 Unspecified asthma, uncomplicated: Secondary | ICD-10-CM | POA: Diagnosis not present

## 2015-09-11 DIAGNOSIS — Z7901 Long term (current) use of anticoagulants: Secondary | ICD-10-CM | POA: Diagnosis not present

## 2015-09-11 DIAGNOSIS — I4891 Unspecified atrial fibrillation: Secondary | ICD-10-CM

## 2015-09-11 DIAGNOSIS — Z7984 Long term (current) use of oral hypoglycemic drugs: Secondary | ICD-10-CM | POA: Diagnosis not present

## 2015-09-11 LAB — CBC
HCT: 38.8 % (ref 36.0–46.0)
Hemoglobin: 12.9 g/dL (ref 12.0–15.0)
MCH: 27.4 pg (ref 26.0–34.0)
MCHC: 33.2 g/dL (ref 30.0–36.0)
MCV: 82.4 fL (ref 78.0–100.0)
Platelets: 365 10*3/uL (ref 150–400)
RBC: 4.71 MIL/uL (ref 3.87–5.11)
RDW: 12.8 % (ref 11.5–15.5)
WBC: 7.5 10*3/uL (ref 4.0–10.5)

## 2015-09-11 LAB — BASIC METABOLIC PANEL
Anion gap: 12 (ref 5–15)
BUN: 11 mg/dL (ref 6–20)
CO2: 19 mmol/L — ABNORMAL LOW (ref 22–32)
Calcium: 9.1 mg/dL (ref 8.9–10.3)
Chloride: 107 mmol/L (ref 101–111)
Creatinine, Ser: 0.87 mg/dL (ref 0.44–1.00)
GFR calc Af Amer: >60 mL/min (ref >60)
GFR calc non Af Amer: >60 mL/min (ref >60)
Glucose, Bld: 100 mg/dL — ABNORMAL HIGH (ref 65–99)
Potassium: 3.5 mmol/L (ref 3.5–5.1)
Sodium: 138 mmol/L (ref 135–145)

## 2015-09-11 LAB — I-STAT TROPONIN, ED: Troponin i, poc: 0.05 ng/mL (ref 0.00–0.08)

## 2015-09-11 LAB — PROTIME-INR
INR: 1.2 (ref 0.00–1.49)
Prothrombin Time: 15.4 seconds — ABNORMAL HIGH (ref 11.6–15.2)

## 2015-09-11 LAB — MAGNESIUM: Magnesium: 1.6 mg/dL — ABNORMAL LOW (ref 1.7–2.4)

## 2015-09-11 MED ORDER — APIXABAN 5 MG PO TABS
5.0000 mg | ORAL_TABLET | Freq: Two times a day (BID) | ORAL | Status: DC
  Administered 2015-09-11: 5 mg via ORAL
  Filled 2015-09-11: qty 1

## 2015-09-11 MED ORDER — DILTIAZEM HCL 25 MG/5ML IV SOLN
20.0000 mg | Freq: Once | INTRAVENOUS | Status: AC
  Administered 2015-09-11: 20 mg via INTRAVENOUS
  Filled 2015-09-11: qty 5

## 2015-09-11 MED ORDER — APIXABAN 5 MG PO TABS: 5.0000 mg | ORAL_TABLET | Freq: Two times a day (BID) | ORAL | Status: DC

## 2015-09-11 MED ORDER — FLECAINIDE ACETATE 50 MG PO TABS: 50.0000 mg | ORAL_TABLET | Freq: Two times a day (BID) | ORAL | Status: DC

## 2015-09-11 NOTE — ED Notes (Addendum)
Pt placed on bedpan by NT,  Pt converted to NSR-ST,  Cards at bedside.  EKG completed,  Given to cards and MD Pfeiffer.

## 2015-09-11 NOTE — Consult Note (Signed)
ELECTROPHYSIOLOGY CONSULT NOTE    Patient ID: Dawn Braun MRN: [id number], DOB/AGE: November 12, 1965 50 y.o.  Admit date: 09/11/2015 Date of Consult: 09/11/2015  Primary Physician: No primary care provider on file. Primary Cardiologist: (none) Requesting MD:   Reason for Consultation: PAFib  HPI: Dawn Braun is a 50 y.o. female PAFib diagnosed Feb this year, DM, asthma as a child rare exacerbations as an adult.  The patient comes with recurrent AFib, she woke feeling weak and with palpitations, as her day/morning went felt worse, could feel her heart beating "out of my chest" and worsening generalized weakness.  She took her PRN 30mg  dose of cardizem without improvement, in fact felt worse.  She had no syncope, no CP, no SOB, but did feel nauseous with a single episode of vomiting.  She arrived in AFib, 110 and received a single dose of IV cardizem bolus of 20mg .    She thinks in hind-sight she has probably been having AF episodes since about Jan, this correlates with her last normal menstrual cycle, she has been having hot flashes and suspects she is perimenopausal, not yet seeing her PMD or GYN regarding this. ( She has hx of tubal ligation.)  Labs: BUN/Creat 11/0.87 K+ 3.5, Mag 1.6 H/H 12/38, plts 365 Trop 0.05  07/01/15 TSH 0.863  AFib hx: Initial diagnosis, Feb 2017 started on Cardizem and Eliquis  follows at AF clinic Recurrent symptoms of AF 07/01/15 was instructed to take an extra cardizem 120mg  arrived in SR at AF clinic same day, Rx a PRN 30mg  diltiazem 08/17/15: sleep study negative for any significant aleep apnea No hx of AAD therapy  Past Medical History  Diagnosis Date  . Diabetes mellitus without complication (HCC)     type 2  . Asthma   . A-fib (HCC)   . Hx of tubal ligation      Surgical History:  Past Surgical History  Procedure Laterality Date  . Split night study  08/06/2015  . Eye surgery Right       (Not in a hospital admission)  Inpatient  Medications:  . apixaban  5 mg Oral BID    Allergies: No Known Allergies  Social History   Social History  . Marital Status: Single    Spouse Name: N/A  . Number of Children: N/A  . Years of Education: N/A   Occupational History  . Not on file.   Social History Main Topics  . Smoking status: Never Smoker   . Smokeless tobacco: Not on file  . Alcohol Use: Yes     Comment: occasional  . Drug Use: No  . Sexual Activity: Not on file   Other Topics Concern  . Not on file   Social History Narrative     Family History  Problem Relation Age of Onset  . Epilepsy Father   . Diabetes Mother   . Kidney disease Mother   . Hypertension Mother   . Kidney disease Brother   . Coronary artery disease Brother   . Diabetes Brother      Review of Systems: All other systems reviewed and are otherwise negative except as noted above.  Physical Exam: Filed Vitals:   09/11/15 1252 09/11/15 1300 09/11/15 1308 09/11/15 1315  BP:  132/92  134/82  Pulse: 84  82 83  Temp:      TempSrc:      Resp: 15  15 21   Height:      Weight:      SpO2:  100%  100% 100%    GEN- The patient is well appearing, alert and oriented x 3 today.   HEENT: normocephalic, atraumatic; sclera clear, conjunctiva pink; hearing intact; oropharynx clear; neck supple, no JVP Lymph- no cervical lymphadenopathy Lungs- Clear to ausculation bilaterally, normal work of breathing.  No wheezes, rales, rhonchi Heart- Regular rate and rhythm, no murmurs, rubs or gallops, PMI not laterally displaced GI- soft, non-tender, non-distended, bowel sounds present Extremities- no clubbing, cyanosis, or edema MS- no significant deformity or atrophy Skin- warm and dry, no rash or lesion Psych- euthymic mood, full affect Neuro- no gross deficits observed  Labs:   Lab Results  Component Value Date   WBC 7.5 09/11/2015   HGB 12.9 09/11/2015   HCT 38.8 09/11/2015   MCV 82.4 09/11/2015   PLT 365 09/11/2015     Recent  Labs Lab 09/11/15 1108  NA 138  K 3.5  CL 107  CO2 19*  BUN 11  CREATININE 0.87  CALCIUM 9.1  GLUCOSE 100*      Radiology/Studies:  Dg Chest 2 View 09/11/2015  CLINICAL DATA:  Shortness of breath, atrial fibrillation, pounding in chest, diabetes mellitus, dizziness, vomiting, symptoms since 0200 hours today EXAM: CHEST  2 VIEW COMPARISON:  05/09/2015 FINDINGS: Upper normal heart size. Mediastinal contours and pulmonary vascularity normal. Lungs clear. No pleural effusion or pneumothorax. Bones unremarkable. IMPRESSION: No acute abnormalities. Electronically Signed   By: Ulyses Southward M.D.   On: 09/11/2015 10:49    EKG: Afib 110bpm, rightward axis, QRS 86ms TELEMETRY: SR 70's  07/11/15: Echocardiogram Study Conclusions - Left ventricle: The cavity size was normal. There was mild  concentric hypertrophy. Systolic function was normal. The  estimated ejection fraction was in the range of 60% to 65%. Wall  motion was normal; there were no regional wall motion  abnormalities. - Aortic valve: Trileaflet; mildly thickened, mildly calcified  leaflets. - Mitral valve: There was mild regurgitation. (LA 40mm)   Assessment and Plan:  1. PAFib     CHA2DS2Vasc is 2 on Eliquis since Feb, no missed doses, she has returned here in the ER to SR.      BP has been stable The patient estimates based on symptoms 5-6 episodes of AF in the last 6 months, usually occurring/starting overnight Dr. Graciela Husbands discussed at length therapy options with the patient, recommend adding Flecainide  PO BID and ok to discharge home, an out patient stress test and f/u with AF clinic have been arranged.   Signed, Francis Dowse, PA-C 09/11/2015 1:49 PM  The patient has paroxysmal atrial fibrillation which is quite symptomatic. She is appropriately anticoagulated with her CHADS-VASc score of 2. We discussed rate control versus rhythm control. Given the frequency and severity of her symptoms she would like to pursue  the latter. We discussed the proarrhythmic potential of antiarrhythmic therapy. We will start her on flecainide 50 mg twice daily. We will anticipate outpatient treadmill testing and exclusion of coronary artery disease by Myoview scanning. She may need further up titration.  It is not clear to me as to why this young lady has atrial fibrillation; she may have some diastolic dysfunction given her left atrial enlargement and a long-standing diabetes. Her sleep study recently was negative. It is noteworthy that her atrial fibrillation also always occurs at night suggesting a vasovagal mechanism. This being the case I would avoid beta blockers.  We also discussed the potential for referral for catheter ablation given her young age. She might be one in  home" for implanted and is to monitor for the need for long-term anticoagulation.

## 2015-09-11 NOTE — ED Notes (Signed)
MD at bedside. 

## 2015-09-11 NOTE — ED Provider Notes (Signed)
CSN: [id number]     Arrival date & time 09/11/15  [id number] History   First MD Initiated Contact with Patient 09/11/15 1011     Chief Complaint  Patient presents with  . Atrial Fibrillation     (Consider location/radiation/quality/duration/timing/severity/associated sxs/prior Treatment) HPI Patient has history of paroxysmal atrial fibrillation. This started in February. She has been taking eliquis. She does not normally work a night shift. 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 At approximately 3 AM she felt her heart start to race. She reports that she got weak feeling and nauseated. She tried to lie down and rest hoping it would resolve. She however went on to have several episodes of vomiting and diaphoresis with lightheadedness. She denies she had chest pain. She reports she could however feel her heart racing. This morning when the day crew came in, they were going to drive her to the emergency department but she was very weak and diaphoretic so she was brought by EMS. At this time she reports she does feel improved. She is not having chest pain. She does still perceive the racing of her heart. And she feels somewhat lightheaded with position change. She denies she's been ill recently she has not had vomiting or diarrheal illness. She has been compliant with medications. Past Medical History  Diagnosis Date  . Diabetes mellitus without complication (HCC)     type 2  . Asthma   . A-fib (HCC)   . Hx of tubal ligation    Past Surgical History  Procedure Laterality Date  . Split night study  08/06/2015  . Eye surgery Right    Family History  Problem Relation Age of Onset  . Epilepsy Father   . Diabetes Mother   . Kidney disease Mother   . Hypertension Mother   . Kidney disease Brother   . Coronary artery disease Brother   . Diabetes Brother    Social History  Substance Use Topics  . Smoking status: Never Smoker   . Smokeless tobacco: None  . Alcohol Use: Yes     Comment: occasional    OB History    No data available     Review of Systems 10 Systems reviewed and are negative for acute change except as noted in the HPI.    Allergies  Review of patient's allergies indicates no known allergies.  Home Medications   Prior to Admission medications   Medication Sig Start Date End Date Taking? Authorizing Provider  apixaban (ELIQUIS) 5 MG TABS tablet Take 1 tablet (5 mg total) by mouth 2 (two) times daily. 07/11/15  Yes Newman Niponna C Carroll, NP  diltiazem (CARDIZEM CD) 120 MG 24 hr capsule Take 1 capsule (120 mg total) by mouth daily. 06/03/15  Yes Newman Niponna C Carroll, NP  diltiazem (CARDIZEM) 30 MG tablet Cardizem 30mg  -- take 1 tablet by mouth every 4 hours AS NEEDED for heart rate >100 as long as blood pressure >100. 07/01/15  Yes Newman Niponna C Carroll, NP  glimepiride (AMARYL) 2 MG tablet Take 2 mg by mouth daily with breakfast.   Yes Historical Provider, MD  linagliptin (TRADJENTA) 5 MG TABS tablet Take 5 mg by mouth daily.   Yes Historical Provider, MD  Linagliptin-Metformin HCl (JENTADUETO) 2.08-998 MG TABS Take 0.5 tablets by mouth daily.    Yes Historical Provider, MD  VICTOZA 18 MG/3ML SOPN Inject 1.8 mg into the skin daily. 08/17/15  Yes Historical Provider, MD  flecainide (TAMBOCOR) 50 MG tablet Take 1 tablet (50 mg total) by  mouth 2 (two) times daily. 09/11/15   Arby Barrette, MD   BP 128/74 mmHg  Pulse 76  Temp(Src) 97.6 F (36.4 C) (Oral)  Resp 12  Ht 5\' 2"  (1.575 m)  Wt 191 lb (86.637 kg)  BMI 34.93 kg/m2  SpO2 100% Physical Exam  Constitutional: She is oriented to person, place, and time. She appears well-developed and well-nourished.  HENT:  Head: Normocephalic and atraumatic.  Nose: Nose normal.  Mouth/Throat: Oropharynx is clear and moist.  Eyes: EOM are normal. Pupils are equal, round, and reactive to light.  Neck: Neck supple.  Cardiovascular: Normal heart sounds and intact distal pulses.   Tachycardia, irregularly irregular.  Pulmonary/Chest: Effort normal  and breath sounds normal.  Abdominal: Soft. Bowel sounds are normal. She exhibits no distension. There is no tenderness.  Musculoskeletal: Normal range of motion. She exhibits no edema or tenderness.  Neurological: She is alert and oriented to person, place, and time. She has normal strength. Coordination normal. GCS eye subscore is 4. GCS verbal subscore is 5. GCS motor subscore is 6.  Skin: Skin is warm, dry and intact.  Psychiatric: She has a normal mood and affect.    ED Course  Procedures (including critical care time) CRITICAL CARE Performed by: Arby Barrette   Total critical care time:30  minutes  Critical care time was exclusive of separately billable procedures and treating other patients.  Critical care was necessary to treat or prevent imminent or life-threatening deterioration.  Critical care was time spent personally by me on the following activities: development of treatment plan with patient and/or surrogate as well as nursing, discussions with consultants, evaluation of patient's response to treatment, examination of patient, obtaining history from patient or surrogate, ordering and performing treatments and interventions, ordering and review of laboratory studies, ordering and review of radiographic studies, pulse oximetry and re-evaluation of patient's condition. Labs Review Labs Reviewed  BASIC METABOLIC PANEL - Abnormal; Notable for the following:    CO2 19 (*)    Glucose, Bld 100 (*)    All other components within normal limits  MAGNESIUM - Abnormal; Notable for the following:    Magnesium 1.6 (*)    All other components within normal limits  PROTIME-INR - Abnormal; Notable for the following:    Prothrombin Time 15.4 (*)    All other components within normal limits  CBC  I-STAT TROPOININ, ED    Imaging Review Dg Chest 2 View  09/11/2015  CLINICAL DATA:  Shortness of breath, atrial fibrillation, pounding in chest, diabetes mellitus, dizziness, vomiting,  symptoms since 0200 hours today EXAM: CHEST  2 VIEW COMPARISON:  05/09/2015 FINDINGS: Upper normal heart size. Mediastinal contours and pulmonary vascularity normal. Lungs clear. No pleural effusion or pneumothorax. Bones unremarkable. IMPRESSION: No acute abnormalities. Electronically Signed   By: Ulyses Southward M.D.   On: 09/11/2015 10:49   I have personally reviewed and evaluated these images and lab results as part of my medical decision-making.   EKG Interpretation   Date/Time:  Wednesday September 11 2015 13:03:40 EDT Ventricular Rate:  94 PR Interval:  146 QRS Duration: 87 QT Interval:  361 QTC Calculation: 451 R Axis:   125 Text Interpretation:  Sinus rhythm Right axis deviation agree. no ischemic  changes Confirmed by Donnald Garre, MD, Lebron Conners [phone]) on 09/11/2015 3:07:03 PM     First EKG interpreted by myself is for atrial fibrillation rate of 110 without acute ischemia.  Consult: Cardiology. MDM   Final diagnoses:  Paroxysmal atrial fibrillation (HCC)  Atrial fibrillation with rapid ventricular response [facility])   Patient has history of paroxysmal atrial fibrillation. She had an episode that started acutely during the night. Symptoms had worsened with associated nausea, vomiting or weakness. Patient was treated in the emergency department with Cardizem IV and administered her Eliquis dose. She ultimately spontaneously converted to sinus rhythm. At that time patient is asymptomatic with stable vital signs. This occurred just as cardiology was initiating their consultation. At this point now the patient is deemed safe for discharge. Recommendation for cardiology consultation is to initiate flecainide 50 mg twice a day and they have arranged for outpatient follow-up and stress testing.    Arby Barrette, MD 09/11/15 [phone]

## 2015-09-11 NOTE — Discharge Instructions (Signed)
You have an appointment set up with the Atrial Fibrillation Clinic.  Multiple studies have shown that being followed by a dedicated atrial fibrillation clinic in addition to the standard care you receive from your other physicians improves health. We believe that enrollment in the atrial fibrillation clinic will allow [place] to better care for you.   The phone number to the Atrial Fibrillation Clinic is [phone]. The clinic is staffed Monday through Friday from 8:30am to 5pm.  Parking Directions: The clinic is located in the Heart and Vascular Building connected to [facility]. 1)From [address] turn on to [address] and go to the 3rd entrance  (Heart and Vascular entrance) on the right. 2)Look to the right for Heart &Vascular Parking Garage. 3)A code for the entrance is 0020.   4)Take the elevators to the 1st floor. Registration is in the room with the glass walls at the end of the hallway.  If you have any trouble parking or locating the clinic, please dont hesitate to call [phone].   Atrial Fibrillation Atrial fibrillation is a type of irregular or rapid heartbeat (arrhythmia). In atrial fibrillation, the heart quivers continuously in a chaotic pattern. This occurs when parts of the heart receive disorganized signals that make the heart unable to pump blood normally. This can increase the risk for stroke, heart failure, and other heart-related conditions. There are different types of atrial fibrillation, including:  Paroxysmal atrial fibrillation. This type starts suddenly, and it usually stops on its own shortly after it starts.  Persistent atrial fibrillation. This type often lasts longer than a week. It may stop on its own or with treatment.  Long-lasting persistent atrial fibrillation. This type lasts longer than 12 months.  Permanent atrial fibrillation. This type does not go away. Talk with your health care provider to learn about the type of atrial fibrillation  that you have. CAUSES This condition is caused by some heart-related conditions or procedures, including:  A heart attack.  Coronary artery disease.  Heart failure.  Heart valve conditions.  High blood pressure.  Inflammation of the sac that surrounds the heart (pericarditis).  Heart surgery.  Certain heart rhythm disorders, such as Wolf-Parkinson-White syndrome. Other causes include:  Pneumonia.  Obstructive sleep apnea.  Blockage of an artery in the lungs (pulmonary embolism, or PE).  Lung cancer.  Chronic lung disease.  Thyroid problems, especially if the thyroid is overactive (hyperthyroidism).  Caffeine.  Excessive alcohol use or illegal drug use.  Use of some medicines, including certain decongestants and diet pills. Sometimes, the cause cannot be found. RISK FACTORS This condition is more likely to develop in:  People who are older in age.  People who smoke.  People who have diabetes mellitus.  People who are overweight (obese).  Athletes who exercise vigorously. SYMPTOMS Symptoms of this condition include:  A feeling that your heart is beating rapidly or irregularly.  A feeling of discomfort or pain in your chest.  Shortness of breath.  Sudden light-headedness or weakness.  Getting tired easily during exercise. In some cases, there are no symptoms. DIAGNOSIS Your health care provider may be able to detect atrial fibrillation when taking your pulse. If detected, this condition may be diagnosed with:  An electrocardiogram (ECG).  A Holter monitor test that records your heartbeat patterns over a 24-hour period.  Transthoracic echocardiogram (TTE) to evaluate how blood flows through your heart.  Transesophageal echocardiogram (TEE) to view more detailed images of your heart.  A stress test.  Imaging tests,  such as a CT scan or chest X-ray.  Blood tests. TREATMENT The main goals of treatment are to prevent blood clots from forming  and to keep your heart beating at a normal rate and rhythm. The type of treatment that you receive depends on many factors, such as your underlying medical conditions and how you feel when you are experiencing atrial fibrillation. This condition may be treated with:  Medicine to slow down the heart rate, bring the heart's rhythm back to normal, or prevent clots from forming.  Electrical cardioversion. This is a procedure that resets your heart's rhythm by delivering a controlled, low-energy shock to the heart through your skin.  Different types of ablation, such as catheter ablation, catheter ablation with pacemaker, or surgical ablation. These procedures destroy the heart tissues that send abnormal signals. When the pacemaker is used, it is placed under your skin to help your heart beat in a regular rhythm. HOME CARE INSTRUCTIONS  Take over-the counter and prescription medicines only as told by your health care provider.  If your health care provider prescribed a blood-thinning medicine (anticoagulant), take it exactly as told. Taking too much blood-thinning medicine can cause bleeding. If you do not take enough blood-thinning medicine, you will not have the protection that you need against stroke and other problems.  Do not use tobacco products, including cigarettes, chewing tobacco, and e-cigarettes. If you need help quitting, ask your health care provider.  If you have obstructive sleep apnea, manage your condition as told by your health care provider.  Do not drink alcohol.  Do not drink beverages that contain caffeine, such as coffee, soda, and tea.  Maintain a healthy weight. Do not use diet pills unless your health care provider approves. Diet pills may make heart problems worse.  Follow diet instructions as told by your health care provider.  Exercise regularly as told by your health care provider.  Keep all follow-up visits as told by your health care provider. This is  important. PREVENTION  Avoid drinking beverages that contain caffeine or alcohol.  Avoid certain medicines, especially medicines that are used for breathing problems.  Avoid certain herbs and herbal medicines, such as those that contain ephedra or ginseng.  Do not use illegal drugs, such as cocaine and amphetamines.  Do not smoke.  Manage your high blood pressure. SEEK MEDICAL CARE IF:  You notice a change in the rate, rhythm, or strength of your heartbeat.  You are taking an anticoagulant and you notice increased bruising.  You tire more easily when you exercise or exert yourself. SEEK IMMEDIATE MEDICAL CARE IF:  You have chest pain, abdominal pain, sweating, or weakness.  You feel nauseous.  You notice blood in your vomit, bowel movement, or urine.  You have shortness of breath.  You suddenly have swollen feet and ankles.  You feel dizzy.  You have sudden weakness or numbness of the face, arm, or leg, especially on one side of the body.  You have trouble speaking, trouble understanding, or both (aphasia).  Your face or your eyelid droops on one side. These symptoms may represent a serious problem that is an emergency. Do not wait to see if the symptoms will go away. Get medical help right away. Call your local emergency services (911 in the [place] Do not drive yourself to the hospital.   This information is not intended to replace advice given to you by your health care provider. Make sure you discuss any questions you have with your health  care provider.   Document Released: 03/23/2005 Document Revised: 12/12/2014 Document Reviewed: 07/18/2014 Elsevier Interactive Patient Education [employer]

## 2015-09-11 NOTE — ED Notes (Signed)
Pt presents via [facility] from work with c/o Afib.  [place], started feeling palpitations at 0200, went to work this AM.  On EMS arrival pt was cool, diaphoretic, HR 140-160 irreg.  Pt received 10 cardizem, HR decreased to 100s.  Pt SOB with speaking.  Pt hx: Afib, DM, on eliquis, cardizem, victoza and genaduteo.   Denies of DVT, PE.  BP-131/74, HR 100 Afib, CBG-108.  A x 4.

## 2015-09-16 ENCOUNTER — Ambulatory Visit (HOSPITAL_COMMUNITY): Payer: 59 | Admitting: Nurse Practitioner

## 2015-09-24 ENCOUNTER — Encounter (HOSPITAL_COMMUNITY): Payer: Self-pay | Admitting: Nurse Practitioner

## 2015-09-24 ENCOUNTER — Ambulatory Visit (HOSPITAL_COMMUNITY)
Admission: RE | Admit: 2015-09-24 | Discharge: 2015-09-24 | Disposition: A | Discharge status: Home / Self Care | Payer: 59 | Source: Ambulatory Visit | Attending: Nurse Practitioner | Admitting: Nurse Practitioner

## 2015-09-24 VITALS — BP 114/80 | HR 86 | Ht 62.0 in | Wt 187.8 lb

## 2015-09-24 DIAGNOSIS — I480 Paroxysmal atrial fibrillation: Secondary | ICD-10-CM | POA: Diagnosis present

## 2015-09-24 DIAGNOSIS — I445 Left posterior fascicular block: Secondary | ICD-10-CM | POA: Insufficient documentation

## 2015-09-24 DIAGNOSIS — I517 Cardiomegaly: Secondary | ICD-10-CM | POA: Insufficient documentation

## 2015-09-24 DIAGNOSIS — J45909 Unspecified asthma, uncomplicated: Secondary | ICD-10-CM | POA: Diagnosis not present

## 2015-09-24 DIAGNOSIS — R635 Abnormal weight gain: Secondary | ICD-10-CM | POA: Diagnosis not present

## 2015-09-24 DIAGNOSIS — E119 Type 2 diabetes mellitus without complications: Secondary | ICD-10-CM | POA: Diagnosis not present

## 2015-09-24 MED ORDER — FLECAINIDE ACETATE 50 MG PO TABS: 50.0000 mg | ORAL_TABLET | Freq: Two times a day (BID) | ORAL | Status: DC

## 2015-09-24 NOTE — Progress Notes (Signed)
Patient ID: Dawn Braun, female   DOB: 02/17/1966, 50 y.o.   MRN: [id number]     Primary Care Physician: No primary care provider on file. Referring Physician: ER f/u Cardiologist: None   Dawn Braun is a 50 y.o. female with a h/o PAF that newly diagnosed in the ER 2/2. She has had two previous epiosodes of fast heart bet associated with shortness of breath and fatigue over the last few months. She converted with IV cardizem and d/c home on 120 mg cardizem daily and  apixaban for chadsvasc score of 2(female and DM).   She is being compliant with cardizem and apixaban. She does not drink alcohol, cutting down on caffeine use, no tobacco, recent weight gain from diabetes meds around 6 lbs, but DM meds just adjusted. She does snore and wakes up with morning headaches and has daytime somnolence so will order sleep study. No regular exercise program.  She called the office 3/27 and reported irregular heart beat that was present when she woke up.  It had been going on around 3-4 hours when she called the office and was instructed to take another cardizem 120 mg. She was given an appointment with afib clinic at 2:30pm, but she came in around 11:30 am because she felt bad. She had converted to SR by the time she arrived, but felt weak and washed out. BP around 100 systolic. She can not identify a trigger but is pending a sleep study in May with probable sleep apnea.  Returns 4/6. No further afib. Echo this am which showed basically normal structure heart. We discuss antiarrythmic's but she would like to wait to start, but if she gets another quick reoccurrence she will consider. Sleep study done 5/4, and was negative.  She was working 3rd shift early morning of 6/7 and became aware of her heart rate becoming elevated. She took hr 120 mg am cardizem dose. After 30 mins, and no change in heart rate, she took 30 mg cardizem dose. After that she got weak and started vomiting x1. Co workers called  EMS when they arrived to work around 7:30 am and found pt feeling weak. She did convert in the ER and was d/c on flecainide 50 mg bid. Today in f/u, she is doing well on flecainide and no further breakthrough episodes.She is pending an ETT 7/5. She continues on flecainide.  Today, she denies symptoms of palpitations, chest pain, shortness of breath, orthopnea, PND, lower extremity edema, dizziness, presyncope, syncope, or neurologic sequela. The patient is tolerating medications without difficulties and is otherwise without complaint today.   Past Medical History  Diagnosis Date  . Diabetes mellitus without complication (HCC)     type 2  . Asthma   . A-fib (HCC)   . Hx of tubal ligation    Past Surgical History  Procedure Laterality Date  . Split night study  08/06/2015  . Eye surgery Right     Current Outpatient Prescriptions  Medication Sig Dispense Refill  . apixaban (ELIQUIS) 5 MG TABS tablet Take 1 tablet (5 mg total) by mouth 2 (two) times daily. 60 tablet 11  . diltiazem (CARDIZEM CD) 120 MG 24 hr capsule Take 1 capsule (120 mg total) by mouth daily. 30 capsule 6  . diltiazem (CARDIZEM) 30 MG tablet Cardizem  -- take 1 tablet by mouth every 4 hours AS NEEDED for heart rate >100 as long as blood pressure >100. 45 tablet 3  . flecainide (TAMBOCOR) 50 MG tablet Take  1 tablet (50 mg total) by mouth 2 (two) times daily. 60 tablet 6  . Linagliptin-Metformin HCl (JENTADUETO) 2.08-998 MG TABS Take 0.5 tablets by mouth daily.     [name]. VICTOZA 18 MG/3ML SOPN Inject 1.8 mg into the skin daily.  3   No current facility-administered medications for this encounter.    No Known Allergies  Social History   Social History  . Marital Status: Single    Spouse Name: N/A  . Number of Children: N/A  . Years of Education: N/A   Occupational History  . Not on file.   Social History Main Topics  . Smoking status: Never Smoker   . Smokeless tobacco: Not on file  . Alcohol Use: Yes      Comment: occasional  . Drug Use: No  . Sexual Activity: Not on file   Other Topics Concern  . Not on file   Social History Narrative    Family History  Problem Relation Age of Onset  . Epilepsy Father   . Diabetes Mother   . Kidney disease Mother   . Hypertension Mother   . Kidney disease Brother   . Coronary artery disease Brother   . Diabetes Brother     ROS- All systems are reviewed and negative except as per the HPI above  Physical Exam: Filed Vitals:   09/24/15 0846  BP: 114/80  Pulse: 86  Height: 5\' 2"  (1.575 m)  Weight: 187 lb 12.8 oz (85.186 kg)    GEN- The patient is well appearing, alert and oriented x 3 today.   Head- normocephalic, atraumatic Eyes-  Sclera clear, conjunctiva pink Ears- hearing intact Oropharynx- clear Neck- supple, no JVP Lymph- no cervical lymphadenopathy Lungs- Clear to ausculation bilaterally, normal work of breathing Heart- Regular rate and rhythm, no murmurs, rubs or gallops, PMI not laterally displaced GI- soft, NT, ND, + BS Extremities- no clubbing, cyanosis, or edema MS- no significant deformity or atrophy Skin- no rash or lesion Psych- euthymic mood, full affect Neuro- strength and sensation are intact  EKG- NSR at 86 bpm, Pr int 170 ms, Qrs int 86 ms, Qtc 459 ms Epic records reviewed  Assessment and Plan: 1. PAF Continue cardizem 120 mg qd Can use 30 mg cardizem if sys BP is over 100 sys, she has a tendency to drop her BP in afib Continue apixaban  Continue flecainide 50 mg bid ETT pending 7/5  2. Lifestyle risk for afib Sleep study negative Develop regular exercise program Weight loss advised  F/u in 6 weeks   Lupita LeashDonna C. Matthew Folksarroll, ANP-C Afib Clinic [facility] [address] [place], [place] [id number] [phone]

## 2015-09-25 ENCOUNTER — Ambulatory Visit (HOSPITAL_COMMUNITY): Payer: 59 | Admitting: Nurse Practitioner

## 2015-10-04 ENCOUNTER — Telehealth (HOSPITAL_COMMUNITY): Payer: Self-pay | Admitting: Physician Assistant

## 2015-10-09 NOTE — Telephone Encounter (Signed)
Close encounter 

## 2015-10-28 ENCOUNTER — Encounter (HOSPITAL_COMMUNITY): Payer: Self-pay | Admitting: *Deleted

## 2015-10-28 ENCOUNTER — Other Ambulatory Visit (HOSPITAL_COMMUNITY): Payer: Self-pay | Admitting: *Deleted

## 2015-10-28 ENCOUNTER — Inpatient Hospital Stay (HOSPITAL_COMMUNITY): Admission: RE | Admit: 2015-10-28 | Payer: 59 | Source: Ambulatory Visit | Admitting: Nurse Practitioner

## 2015-11-01 ENCOUNTER — Encounter (INDEPENDENT_AMBULATORY_CARE_PROVIDER_SITE_OTHER): Payer: 59

## 2015-11-01 DIAGNOSIS — R0989 Other specified symptoms and signs involving the circulatory and respiratory systems: Secondary | ICD-10-CM

## 2015-12-04 ENCOUNTER — Other Ambulatory Visit (HOSPITAL_COMMUNITY): Payer: Self-pay | Admitting: Nurse Practitioner

## 2016-01-01 ENCOUNTER — Emergency Department (HOSPITAL_COMMUNITY)
Admission: EM | Admit: 2016-01-01 | Discharge: 2016-01-01 | Disposition: A | Discharge status: Home / Self Care | Payer: 59 | Attending: Emergency Medicine | Admitting: Emergency Medicine

## 2016-01-01 ENCOUNTER — Emergency Department (HOSPITAL_COMMUNITY): Payer: 59

## 2016-01-01 ENCOUNTER — Encounter (HOSPITAL_COMMUNITY): Payer: Self-pay

## 2016-01-01 DIAGNOSIS — J45909 Unspecified asthma, uncomplicated: Secondary | ICD-10-CM | POA: Diagnosis not present

## 2016-01-01 DIAGNOSIS — Z79899 Other long term (current) drug therapy: Secondary | ICD-10-CM | POA: Diagnosis not present

## 2016-01-01 DIAGNOSIS — R05 Cough: Secondary | ICD-10-CM

## 2016-01-01 DIAGNOSIS — J069 Acute upper respiratory infection, unspecified: Secondary | ICD-10-CM

## 2016-01-01 DIAGNOSIS — E119 Type 2 diabetes mellitus without complications: Secondary | ICD-10-CM | POA: Insufficient documentation

## 2016-01-01 DIAGNOSIS — R059 Cough, unspecified: Secondary | ICD-10-CM

## 2016-01-01 LAB — TROPONIN I: Troponin I: <0.03 ng/mL (ref <0.03)

## 2016-01-01 LAB — CBC WITH DIFFERENTIAL/PLATELET
Basophils Absolute: 0 10*3/uL (ref 0.0–0.1)
Basophils Relative: 0 %
Eosinophils Absolute: 0.3 10*3/uL (ref 0.0–0.7)
Eosinophils Relative: 2 %
HCT: 35.2 % — ABNORMAL LOW (ref 36.0–46.0)
Hemoglobin: 11.6 g/dL — ABNORMAL LOW (ref 12.0–15.0)
Lymphocytes Relative: 30 %
Lymphs Abs: 3.2 10*3/uL (ref 0.7–4.0)
MCH: 28.4 pg (ref 26.0–34.0)
MCHC: 33 g/dL (ref 30.0–36.0)
MCV: 86.1 fL (ref 78.0–100.0)
Monocytes Absolute: 0.5 10*3/uL (ref 0.1–1.0)
Monocytes Relative: 5 %
Neutro Abs: 6.6 10*3/uL (ref 1.7–7.7)
Neutrophils Relative %: 63 %
Platelets: 382 10*3/uL (ref 150–400)
RBC: 4.09 MIL/uL (ref 3.87–5.11)
RDW: 13.6 % (ref 11.5–15.5)
WBC: 10.7 10*3/uL — ABNORMAL HIGH (ref 4.0–10.5)

## 2016-01-01 LAB — BASIC METABOLIC PANEL
Anion gap: 7 (ref 5–15)
BUN: 16 mg/dL (ref 6–20)
CO2: 23 mmol/L (ref 22–32)
Calcium: 9.1 mg/dL (ref 8.9–10.3)
Chloride: 108 mmol/L (ref 101–111)
Creatinine, Ser: 0.68 mg/dL (ref 0.44–1.00)
GFR calc Af Amer: >60 mL/min (ref >60)
GFR calc non Af Amer: >60 mL/min (ref >60)
Glucose, Bld: 126 mg/dL — ABNORMAL HIGH (ref 65–99)
Potassium: 4.3 mmol/L (ref 3.5–5.1)
Sodium: 138 mmol/L (ref 135–145)

## 2016-01-01 MED ORDER — GUAIFENESIN-CODEINE 100-10 MG/5ML PO SYRP: 5.0000 mL | ORAL_SOLUTION | Freq: Three times a day (TID) | ORAL | 0 refills | Status: DC | PRN

## 2016-01-01 MED ORDER — BENZONATATE 100 MG PO CAPS: 100.0000 mg | ORAL_CAPSULE | Freq: Three times a day (TID) | ORAL | 0 refills | Status: DC | PRN

## 2016-01-01 MED ORDER — ALBUTEROL SULFATE (2.5 MG/3ML) 0.083% IN NEBU: 2.5000 mg | INHALATION_SOLUTION | Freq: Four times a day (QID) | RESPIRATORY_TRACT | 0 refills | Status: DC | PRN

## 2016-01-01 MED ORDER — ALBUTEROL SULFATE HFA 108 (90 BASE) MCG/ACT IN AERS
2.0000 | INHALATION_SPRAY | Freq: Four times a day (QID) | RESPIRATORY_TRACT | Status: DC | PRN
  Administered 2016-01-01: 2 via RESPIRATORY_TRACT
  Filled 2016-01-01: qty 6.7

## 2016-01-01 MED ORDER — IPRATROPIUM-ALBUTEROL 0.5-2.5 (3) MG/3ML IN SOLN
3.0000 mL | Freq: Once | RESPIRATORY_TRACT | Status: AC
  Administered 2016-01-01: 3 mL via RESPIRATORY_TRACT
  Filled 2016-01-01: qty 3

## 2016-01-01 NOTE — ED Triage Notes (Signed)
She c/o cough/uri sx since Fri.--coughed so hard "I threw up". She is in no distress. She states hx of a fib. With anticoagulation.

## 2016-01-01 NOTE — ED Provider Notes (Signed)
[facility]Y DEPT Provider Note   CSN: [id number] Arrival date & time: 01/01/16  [id number]     History   Chief Complaint Chief Complaint  Patient presents with  . URI    HPI Dawn Braun is a 50 y.o. female.  Dawn Braun is a 50 y.o. female with h/o afib on anti-coagulation, T2DM, and asthma presents to ED with cough and shortness of breath. Patient reports symptoms started on Friday. Cough is "wet;" however, she is unable to cough anything up. She has associated rhinorrhea, nasal congestion, sore throat, SOB, chest wall soreness, chest tightness, post-tussive emesis. She denies fever, chills, diaphoresis, eye discharge, ear pain, sinus pressure, headache, chest pain, abdominal pain, nausea, leg pain/swelling, or palpitations. She denies any recent long distance travel/surgery/hospitalization, h/o blood clots, hemoptysis, h/o cancer, or OCP use. She has tried theraflu with minimal relief and a nebulizer treatment with improvement.      Past Medical History:  Diagnosis Date  . A-fib (HCC)   . Asthma   . Diabetes mellitus without complication (HCC)    type 2  . Hx of tubal ligation     There are no active problems to display for this patient.   Past Surgical History:  Procedure Laterality Date  . EYE SURGERY Right   . SPLIT NIGHT STUDY  08/06/2015    OB History    No data available       Home Medications    Prior to Admission medications   Medication Sig Start Date End Date Taking? Authorizing Provider  apixaban (ELIQUIS) 5 MG TABS tablet Take 1 tablet (5 mg total) by mouth 2 (two) times daily. 07/11/15  Yes Newman Niponna C Carroll, NP  CARTIA XT 120 MG 24 hr capsule TAKE 1 CAPSULE(120 MG) BY MOUTH DAILY 12/04/15  Yes Newman Niponna C Carroll, NP  diltiazem (CARDIZEM) 30 MG tablet Cardizem 30mg  -- take 1 tablet by mouth every 4 hours AS NEEDED for heart rate >100 as long as blood pressure >100. 07/01/15  Yes Newman Niponna C Carroll, NP  Diphenhydramine-PE-APAP (THERAFLU EXPRESSMAX)  12.5-5-325 MG/15ML LIQD Take 1 Dose by mouth 2 (two) times daily as needed (cold symptoms).   Yes Historical Provider, MD  flecainide (TAMBOCOR) 50 MG tablet Take 1 tablet (50 mg total) by mouth 2 (two) times daily. 09/24/15  Yes Newman Niponna C Carroll, NP  naproxen sodium (ANAPROX) 220 MG tablet Take 220 mg by mouth 2 (two) times daily as needed (pain).   Yes Historical Provider, MD  VICTOZA 18 MG/3ML SOPN Inject 1.8 mg into the skin daily. 08/17/15  Yes Historical Provider, MD  albuterol (PROVENTIL) (2.5 MG/3ML) 0.083% nebulizer solution Take 3 mLs (2.5 mg total) by nebulization every 6 (six) hours as needed for wheezing or shortness of breath. 01/01/16   Lona KettleAshley Laurel Selma Rodelo, PA-C  benzonatate (TESSALON) 100 MG capsule Take 1 capsule (100 mg total) by mouth 3 (three) times daily as needed for cough. 01/01/16   Lona KettleAshley Laurel Cherrie Franca, PA-C  guaiFENesin-codeine [facility](CHERATUSSIN AC) 100-10 MG/5ML syrup Take 5 mLs by mouth 3 (three) times daily as needed for cough. 01/01/16   Lona KettleAshley Laurel Cadance Raus, PA-C    Family History Family History  Problem Relation Age of Onset  . Epilepsy Father   . Diabetes Mother   . Kidney disease Mother   . Hypertension Mother   . Kidney disease Brother   . Coronary artery disease Brother   . Diabetes Brother     Social History Social History  Substance Use Topics  .  Smoking status: Never Smoker  . Smokeless tobacco: Not on file  . Alcohol use Yes     Comment: occasional     Allergies   Review of patient's allergies indicates no known allergies.   Review of Systems Review of Systems  Constitutional: Negative for fever.  HENT: Positive for congestion, rhinorrhea and sore throat. Negative for trouble swallowing.   Eyes: Negative for visual disturbance.  Respiratory: Positive for cough, chest tightness, shortness of breath and wheezing.   Cardiovascular: Negative for chest pain and leg swelling.  Gastrointestinal: Negative for abdominal pain, blood in stool, constipation,  diarrhea, nausea and vomiting.  Genitourinary: Negative for dysuria and hematuria.  Musculoskeletal: Positive for myalgias. Negative for neck pain.  Skin: Negative for rash.  Neurological: Negative for headaches.     Physical Exam Updated Vital Signs BP 144/85 (BP Location: Left Arm)   Pulse 98   Temp 98.5 F (36.9 C) (Oral)   Resp 15   Ht 5\' 2"  (1.575 m)   Wt 84.8 kg   LMP 12/24/2015 (Exact Date)   SpO2 98%   BMI 34.20 kg/m   Physical Exam  Constitutional: She appears well-developed and well-nourished. No distress.  HENT:  Head: Normocephalic and atraumatic.  Right Ear: Tympanic membrane, external ear and ear canal normal.  Left Ear: Tympanic membrane, external ear and ear canal normal.  Nose: Mucosal edema and rhinorrhea present.  Mouth/Throat: Uvula is midline, oropharynx is clear and moist and mucous membranes are normal. No trismus in the jaw. No oropharyngeal exudate. No tonsillar exudate.  Eyes: Conjunctivae and EOM are normal. Pupils are equal, round, and reactive to light. Right eye exhibits no discharge. Left eye exhibits no discharge. No scleral icterus.  Neck: Normal range of motion and phonation normal. Neck supple. No JVD present. No neck rigidity. Normal range of motion present.  Cardiovascular: Normal rate, regular rhythm, normal heart sounds and intact distal pulses.   No murmur heard. No lower extremity edema  Pulmonary/Chest: Effort normal. No stridor. No respiratory distress. She has wheezes. She has no rales.  Abdominal: Soft. Bowel sounds are normal. She exhibits no distension. There is no tenderness. There is no rigidity, no rebound, no guarding and no CVA tenderness.  Musculoskeletal: Normal range of motion. She exhibits no edema.  Lymphadenopathy:    She has no cervical adenopathy.  Neurological: She is alert. She is not disoriented. Coordination and gait normal. GCS eye subscore is 4. GCS verbal subscore is 5. GCS motor subscore is 6.  Skin: Skin is  warm and dry. She is not diaphoretic.  Psychiatric: She has a normal mood and affect. Her behavior is normal.     ED Treatments / Results  Labs (all labs ordered are listed, but only abnormal results are displayed) Labs Reviewed  BASIC METABOLIC PANEL - Abnormal; Notable for the following:       Result Value   Glucose, Bld 126 (*)    All other components within normal limits  CBC WITH DIFFERENTIAL/PLATELET - Abnormal; Notable for the following:    WBC 10.7 (*)    Hemoglobin 11.6 (*)    HCT 35.2 (*)    All other components within normal limits  TROPONIN I    EKG  EKG Interpretation  Date/Time:  Wednesday January 01 2016 09:53:29 EDT Ventricular Rate:  99 PR Interval:    QRS Duration: 89 QT Interval:  358 QTC Calculation: 460 R Axis:   129 Text Interpretation:  Sinus rhythm Right axis deviation Otherwise normal  ECG No significant change since last tracing Confirmed by KNOTT MD, Reuel Boom [phone]) on 01/01/2016 9:59:50 AM Also confirmed by KNOTT MD, DANIEL [phone]), editor Whitney Post, Cala Bradford [phone])  on 01/01/2016 10:58:20 AM       Radiology Dg Chest 2 View  Result Date: 01/01/2016 CLINICAL DATA:  Worsening cough chest congestion and shortness of breath over the past several days. History of atrial fibrillation, asthma, and diabetes. EXAM: CHEST  2 VIEW COMPARISON:  Chest x-ray of September 11, 2015 FINDINGS: The lungs are reasonably well inflated and clear. The heart and pulmonary vascularity are normal. The mediastinum is normal in width. There is no pleural effusion or pneumothorax. The bony thorax is unremarkable. IMPRESSION: There is no active cardiopulmonary disease. Electronically Signed   By: David  [place] M.D.   On: 01/01/2016 09:38    Procedures Procedures (including critical care time)  Medications Ordered in ED Medications  ipratropium-albuterol (DUONEB) 0.5-2.5 (3) MG/3ML nebulizer solution 3 mL (3 mLs Nebulization Given 01/01/16 0933)     Initial Impression / Assessment  and Plan / ED Course  I have reviewed the triage vital signs and the nursing notes.  Pertinent labs & imaging results that were available during my care of the patient were reviewed by me and considered in my medical decision making (see chart for details).  Clinical Course  Value Comment By Time  DG Chest 2 View Normal cardiac silhouette. No evidence of consolidation, effusion, or PTX. No free air under diaphragm.  Lona Kettle, PA-C 09/27 1000   On re-evaluation pt endorses improvement in breathing following breathing treatment. No wheezing heard on exam. Lona Kettle, PA-C 09/27 1030    Patient presents to ED with complaint of cough and SOB. Patient is afebrile and non-toxic appearing in NAD. VSS. Physical exam remarkable for wheezing, mild nasal mucosal edema and rhinorrhea. With complaint of SOB will check CXR to r/o PNA, EKG given h/o afib, and basic labs. Breathing treatment initiated.   EKG shows sinus rhythm with no significant change from previous. Troponin negative. CXR negative for PNA, pleural effusion, or PTX. BMP unremarkable. CBC shows slight bump in WBC, stable anemia. Pt endorses improvement in breathing following breathing treatment although still complains of cough. Suspect viral URI. Discussed results and plan with patient. Discussed symptomatic management to include tylenol/motrin for pain, staying well hydrated, rest, and hand hygiene. Rx tessalon perles, cheratussin, and nebulizer tx.  Follow up with PCP if sxs persist. Return precautions given. Pt voiced understanding and is agreeable.    Final Clinical Impressions(s) / ED Diagnoses   Final diagnoses:  URI (upper respiratory infection)  Cough    New Prescriptions Discharge Medication List as of 01/01/2016 12:25 PM    START taking these medications   Details  benzonatate (TESSALON) 100 MG capsule Take 1 capsule (100 mg total) by mouth 3 (three) times daily as needed for cough., Starting Wed 01/01/2016,  Print    guaiFENesin-codeine (CHERATUSSIN AC) 100-10 MG/5ML syrup Take 5 mLs by mouth 3 (three) times daily as needed for cough., Starting Wed 01/01/2016, Print         [place], [place] 01/02/16 9147    Lyndal Pulley, MD 01/02/16 [phone]

## 2016-01-01 NOTE — Discharge Instructions (Signed)
Read the information below.  You chest x-ray does not show any signs of pneumonia. Your labs are re-assuring.  I have prescribed tessalon perles for cough during the day. I have prescribed cheratussin (which has codeine) for night time cough relief.  I have also provided an inhaler for shortness of breath or wheezing.  You can take tylenol or motrin for pain relief. Stay well hydrated. Get adequate rest. Practice good hand hygeine Use the prescribed medication as directed.  Please discuss all new medications with your pharmacist.   It is important to follow up with a primary doctor, in your discharge paper is a number to call to help establish a doctor.  You may return to the Emergency Department at any time for worsening condition or any new symptoms that concern you. Return if you develop worsening symptoms or fever, shortness of breath chest pain, lower leg swelling, dizziness, lightheadedness.

## 2016-01-03 ENCOUNTER — Other Ambulatory Visit: Payer: Self-pay

## 2016-01-03 ENCOUNTER — Ambulatory Visit (HOSPITAL_BASED_OUTPATIENT_CLINIC_OR_DEPARTMENT_OTHER)
Admission: RE | Admit: 2016-01-03 | Discharge: 2016-01-03 | Disposition: A | Discharge status: Other Acute Inpatient Hospital | Payer: 59 | Source: Ambulatory Visit | Attending: Nurse Practitioner | Admitting: Nurse Practitioner

## 2016-01-03 ENCOUNTER — Emergency Department (HOSPITAL_COMMUNITY): Payer: 59

## 2016-01-03 ENCOUNTER — Emergency Department (HOSPITAL_COMMUNITY)
Admission: EM | Admit: 2016-01-03 | Discharge: 2016-01-03 | Disposition: A | Discharge status: Home / Self Care | Payer: 59 | Attending: Emergency Medicine | Admitting: Emergency Medicine

## 2016-01-03 ENCOUNTER — Encounter (HOSPITAL_COMMUNITY): Payer: Self-pay | Admitting: Vascular Surgery

## 2016-01-03 DIAGNOSIS — R0602 Shortness of breath: Secondary | ICD-10-CM | POA: Insufficient documentation

## 2016-01-03 DIAGNOSIS — Z7984 Long term (current) use of oral hypoglycemic drugs: Secondary | ICD-10-CM | POA: Insufficient documentation

## 2016-01-03 DIAGNOSIS — E119 Type 2 diabetes mellitus without complications: Secondary | ICD-10-CM | POA: Insufficient documentation

## 2016-01-03 DIAGNOSIS — Z7901 Long term (current) use of anticoagulants: Secondary | ICD-10-CM | POA: Insufficient documentation

## 2016-01-03 DIAGNOSIS — I480 Paroxysmal atrial fibrillation: Secondary | ICD-10-CM | POA: Diagnosis not present

## 2016-01-03 DIAGNOSIS — I481 Persistent atrial fibrillation: Secondary | ICD-10-CM | POA: Insufficient documentation

## 2016-01-03 DIAGNOSIS — R1110 Vomiting, unspecified: Secondary | ICD-10-CM | POA: Insufficient documentation

## 2016-01-03 DIAGNOSIS — I4891 Unspecified atrial fibrillation: Secondary | ICD-10-CM | POA: Diagnosis not present

## 2016-01-03 DIAGNOSIS — J45909 Unspecified asthma, uncomplicated: Secondary | ICD-10-CM | POA: Diagnosis not present

## 2016-01-03 DIAGNOSIS — I4819 Other persistent atrial fibrillation: Secondary | ICD-10-CM

## 2016-01-03 DIAGNOSIS — R531 Weakness: Secondary | ICD-10-CM | POA: Diagnosis present

## 2016-01-03 LAB — CBC
HCT: 37.3 % (ref 36.0–46.0)
Hemoglobin: 12.1 g/dL (ref 12.0–15.0)
MCH: 28.1 pg (ref 26.0–34.0)
MCHC: 32.4 g/dL (ref 30.0–36.0)
MCV: 86.5 fL (ref 78.0–100.0)
Platelets: 393 10*3/uL (ref 150–400)
RBC: 4.31 MIL/uL (ref 3.87–5.11)
RDW: 13.4 % (ref 11.5–15.5)
WBC: 10.2 10*3/uL (ref 4.0–10.5)

## 2016-01-03 LAB — MAGNESIUM: Magnesium: 1.9 mg/dL (ref 1.7–2.4)

## 2016-01-03 LAB — BRAIN NATRIURETIC PEPTIDE: B Natriuretic Peptide: 42.2 pg/mL (ref 0.0–100.0)

## 2016-01-03 LAB — APTT: aPTT: 31 seconds (ref 24–36)

## 2016-01-03 LAB — TROPONIN I: Troponin I: <0.03 ng/mL (ref <0.03)

## 2016-01-03 LAB — TSH: TSH: 0.936 u[IU]/mL (ref 0.350–4.500)

## 2016-01-03 MED ORDER — DILTIAZEM LOAD VIA INFUSION
20.0000 mg | Freq: Once | INTRAVENOUS | Status: AC
  Administered 2016-01-03: 20 mg via INTRAVENOUS
  Filled 2016-01-03: qty 20

## 2016-01-03 MED ORDER — ETOMIDATE 2 MG/ML IV SOLN
10.0000 mg | Freq: Once | INTRAVENOUS | Status: AC
  Administered 2016-01-03: 10 mg via INTRAVENOUS
  Filled 2016-01-03: qty 10

## 2016-01-03 MED ORDER — DILTIAZEM HCL-DEXTROSE 100-5 MG/100ML-% IV SOLN (PREMIX)
5.0000 mg/h | INTRAVENOUS | Status: DC
  Administered 2016-01-03: 5 mg/h via INTRAVENOUS
  Filled 2016-01-03: qty 100

## 2016-01-03 MED ORDER — FLECAINIDE ACETATE 100 MG PO TABS: 100.0000 mg | ORAL_TABLET | Freq: Two times a day (BID) | ORAL | 0 refills | Status: DC

## 2016-01-03 NOTE — Procedures (Signed)
   Procedure:   DCCV  Indication:  Symptomatic atrial fib.  Procedure Note:  The patient signed informed consent.  She has had had therapeutic anticoagulation with Eliquis greater than 3 weeks.  Anesthesia was administered by Dr. Jeraldine LootsLockwood.  Adequate airway was maintained throughout and vital followed per protocol.  She was cardioverted x 1 with 120J of biphasic synchronized energy.  He converted to NSR.  There were no apparent complications.  The patient had normal neuro status and respiratory status post procedure with vitals stable as recorded elsewhere.    Follow up:  We will arrange follow up with atrial fib clinic.   Increase the flecainide to 100 mg bid.

## 2016-01-03 NOTE — ED Provider Notes (Addendum)
[facility]PT Provider Note   CSN: [id number] Arrival date & time: 01/03/16  1057     History   Chief Complaint Chief Complaint  Patient presents with  . Weakness  . Atrial Fibrillation    HPI Dawn Braun is a 50 y.o. female.  HPI patient presents for second time in 2 days, now with concern for ongoing weakness, palpitations, discomfort. 2 days ago she was here primarily for cough, states this has somewhat improved, but she has her systems generalized weakness, palpitations, but she attributed to her persistent A. fib. She has chronic A. fib, currently takes diltiazem, flecainide, Eliquis. Today, with persistent generalized discomfort, weakness she went to the atrial fibrillation clinic. There she was found to have atrial fibrillation, rapid ventricular response, sent to the emergency department for evaluation. She denies confusion, disorientation, does acknowledge lightheadedness, denies syncope. Today, with persistent weakness she did take 1 additional dose of Cardizem, without change in her condition.    Past Medical History:  Diagnosis Date  . A-fib (HCC)   . Asthma   . Diabetes mellitus without complication (HCC)    type 2  . Hx of tubal ligation     There are no active problems to display for this patient.   Past Surgical History:  Procedure Laterality Date  . EYE SURGERY Right   . SPLIT NIGHT STUDY  08/06/2015    OB History    No data available       Home Medications    Prior to Admission medications   Medication Sig Start Date End Date Taking? Authorizing Provider  albuterol (PROVENTIL) (2.5 MG/3ML) 0.083% nebulizer solution Take 3 mLs (2.5 mg total) by nebulization every 6 (six) hours as needed for wheezing or shortness of breath. 01/01/16   Lona Kettle, PA-C  apixaban (ELIQUIS) 5 MG TABS tablet Take 1 tablet (5 mg total) by mouth 2 (two) times daily. 07/11/15   Newman Nip, NP  benzonatate (TESSALON) 100 MG capsule Take 1  capsule (100 mg total) by mouth 3 (three) times daily as needed for cough. 01/01/16   Lona Kettle, PA-C  CARTIA XT 120 MG 24 hr capsule TAKE 1 CAPSULE(120 MG) BY MOUTH DAILY 12/04/15   Newman Nip, NP  diltiazem (CARDIZEM) 30 MG tablet Cardizem 30mg  -- take 1 tablet by mouth every 4 hours AS NEEDED for heart rate >100 as long as blood pressure >100. 07/01/15   Newman Nip, NP  Diphenhydramine-PE-APAP [facility]) 12.5-5-325 MG/15ML LIQD Take 1 Dose by mouth 2 (two) times daily as needed (cold symptoms).    Historical Provider, MD  flecainide (TAMBOCOR) 50 MG tablet Take 1 tablet (50 mg total) by mouth 2 (two) times daily. 09/24/15   Newman Nip, NP  guaiFENesin-codeine (CHERATUSSIN AC) 100-10 MG/5ML syrup Take 5 mLs by mouth 3 (three) times daily as needed for cough. 01/01/16   Lona Kettle, PA-C  naproxen sodium (ANAPROX) 220 MG tablet Take 220 mg by mouth 2 (two) times daily as needed (pain).    Historical Provider, MD  VICTOZA 18 MG/3ML SOPN Inject 1.8 mg into the skin daily. 08/17/15   Historical Provider, MD    Family History Family History  Problem Relation Age of Onset  . Epilepsy Father   . Diabetes Mother   . Kidney disease Mother   . Hypertension Mother   . Kidney disease Brother   . Coronary artery disease Brother   . Diabetes Brother     Social History  Social History  Substance Use Topics  . Smoking status: Never Smoker  . Smokeless tobacco: Never Used  . Alcohol use Yes     Comment: occasional     Allergies   Review of patient's allergies indicates no known allergies.   Review of Systems Review of Systems  Constitutional:       Per HPI, otherwise negative  HENT:       Per HPI, otherwise negative  Respiratory:       Per HPI, otherwise negative  Cardiovascular:       Per HPI, otherwise negative  Gastrointestinal: Negative for vomiting.  Endocrine:       Negative aside from HPI  Genitourinary:       Neg aside from HPI    Musculoskeletal:       Per HPI, otherwise negative  Skin: Negative.   Neurological: Positive for weakness. Negative for syncope.     Physical Exam Updated Vital Signs BP 116/76   Pulse (!) 130   Temp 98.4 F (36.9 C) (Oral)   Resp 16   LMP 12/24/2015 (Exact Date)   SpO2 100%   Physical Exam  Constitutional: She is oriented to person, place, and time. She appears well-developed and well-nourished. No distress.  HENT:  Head: Normocephalic and atraumatic.  Eyes: Conjunctivae and EOM are normal.  Cardiovascular: An irregularly irregular rhythm present. Tachycardia present.   Pulmonary/Chest: Effort normal and breath sounds normal. No stridor. No respiratory distress.  Abdominal: She exhibits no distension.  Musculoskeletal: She exhibits no edema.  Neurological: She is alert and oriented to person, place, and time. No cranial nerve deficit.  Skin: Skin is warm and dry.  Psychiatric: She has a normal mood and affect.  Nursing note and vitals reviewed.    ED Treatments / Results  Labs (all labs ordered are listed, but only abnormal results are displayed) Labs Reviewed  APTT  CBC  TROPONIN I  MAGNESIUM  TSH  BRAIN NATRIURETIC PEPTIDE    EKG from cardiac clinic reviewed, A. fib spots, rate 142, abnormal  EKG #1 here A. fib, rapid ventricular response, 127, abnormal  After my initial evaluation I discussed her case with our cardiology, electrophysiology team, they recommend cardioversion here.    Radiology Dg Chest Port 1 View  Result Date: 01/03/2016 CLINICAL DATA:  Atrial fibrillation EXAM: PORTABLE CHEST 1 VIEW COMPARISON:  01/01/2016 FINDINGS: Cardiomegaly. Mild elevation of the right hemidiaphragm again noted. No acute infiltrate or pleural effusion. IMPRESSION: No active disease.  Cardiomegaly. Electronically Signed   By: Natasha MeadLiviu  Pop M.D.   On: 01/03/2016 11:51    Procedures .Sedation Date/Time: 01/03/2016 4:30 PM Performed by: Gerhard MunchLOCKWOOD, Riku Buttery Authorized  by: Gerhard MunchLOCKWOOD, Martavia Tye   Consent:    Consent obtained:  Written   Consent given by:  Patient   Risks discussed:  Inadequate sedation, nausea, vomiting, respiratory compromise necessitating ventilatory assistance and intubation and prolonged sedation necessitating reversal   Alternatives discussed:  Anxiolysis Indications:    Procedure performed:  Cardioversion   Procedure necessitating sedation performed by:  Different physician   Intended level of sedation:  Moderate (conscious sedation) Pre-sedation assessment:    Time since last food or drink:  >6hr   ASA classification: class 1 - normal, healthy patient     Neck mobility: normal     Mouth opening:  3 or more finger widths   Thyromental distance:  4 finger widths   Mallampati score:  II - soft palate, uvula, fauces visible   Pre-sedation assessments completed  and reviewed: airway patency, cardiovascular function, hydration status, mental status, nausea/vomiting, pain level, respiratory function and temperature     History of difficult intubation: no     Pre-sedation assessment completed:  01/03/2016 4:00 PM Immediate pre-procedure details:    Reassessment: Patient reassessed immediately prior to procedure     Reviewed: vital signs, relevant labs/tests and NPO status     Verified: bag valve mask available, emergency equipment available, intubation equipment available, IV patency confirmed, oxygen available and suction available   Procedure details (see MAR for exact dosages):    Sedation start time:  01/03/2016 4:20 PM   Preoxygenation:  Nasal cannula   Sedation:  Etomidate   Intra-procedure monitoring:  Blood pressure monitoring, cardiac monitor, continuous capnometry, continuous pulse oximetry, frequent LOC assessments and frequent vital sign checks   Intra-procedure events: none     Sedation end time:  01/03/2016 4:50 PM   Total sedation time (minutes):  25 Post-procedure details:    Post-sedation assessment completed:  01/03/2016  5:09 PM   Attendance: Constant attendance by certified staff until patient recovered     Recovery: Patient returned to pre-procedure baseline     Complications:  None   Post-sedation assessments completed and reviewed: airway patency, cardiovascular function, hydration status, mental status, nausea/vomiting, pain level, respiratory function and temperature     Specimens recovered:  None   Patient is stable for discharge or admission: yes     Patient tolerance:  Tolerated well, no immediate complications   (including critical care time)  Medications Ordered in ED Medications  diltiazem (CARDIZEM) 1 mg/mL load via infusion 20 mg (20 mg Intravenous Bolus from Bag 01/03/16 1139)    And  diltiazem (CARDIZEM) 100 mg in dextrose 5% (1 mg/mL) infusion (5 mg/hr Intravenous New Bag/Given 01/03/16 1139)     Initial Impression / Assessment and Plan / ED Course  I have reviewed the triage vital signs and the nursing notes.  Pertinent labs & imaging results that were available during my care of the patient were reviewed by me and considered in my medical decision making (see chart for details).  Clinical Course  Comment By Time  Cardizem 10, still at HR ~130 Gerhard Munch, MD 09/29 1223   1:46 PM Heart rate now 95, A. fib, abnormal, patient appears more calm. I discussed patient's case with our cardiology colleagues for evaluation.   Update: Patient has had ongoing Cardizem titration, now has heart rate in the 90s.  Cardiology now will assist w cardioversion  5:04 PM Patient has had successful cardioversion conducted with our cardiology colleagues.    EKG Interpretation #3  Date/Time:  Friday January 03 2016 16:28:06 EDT Ventricular Rate:  83 PR Interval:    QRS Duration: 86 QT Interval:  378 QTC Calculation: 445 R Axis:   101 Text Interpretation:  Sinus rhythm , new compared to prior Right axis deviation Borderline T abnormalities, anterior leads Abnormal ekg Confirmed by  Gerhard Munch  MD [phone]) on 01/03/2016 4:43:30 PM       Final Clinical Impressions(s) / ED Diagnoses  Patient presents from A. fib clinic, now with A. fib, RVR, and generalized complaints. Patient is already taking nodal blocker, as well as flecainide. Here the patient was started on a diltiazem drip after receiving initial bolus. Eventually, after titration, the patient had subsequent decline in heart rate into the 95, 100 range. Patient remained in atrial fibrillation. Patient's case discussed with our cardio to colleagues for further evaluation, management.   CRITICAL  CARE Performed by: Gerhard Munch Total critical care time: 45 minutes Critical care time was exclusive of separately billable procedures and treating other patients. Critical care was necessary to treat or prevent imminent or life-threatening deterioration. Critical care was time spent personally by me on the following activities: development of treatment plan with patient and/or surrogate as well as nursing, discussions with consultants, evaluation of patient's response to treatment, examination of patient, obtaining history from patient or surrogate, ordering and performing treatments and interventions, ordering and review of laboratory studies, ordering and review of radiographic studies, pulse oximetry and re-evaluation of patient's condition.     Gerhard Munch, MD 01/03/16 1347    Gerhard Munch, MD 01/03/16 1710

## 2016-01-03 NOTE — ED Notes (Addendum)
EDP, 2 RNs, RT, and cardiologist at bedside to perform cardioversion. Ambu bag, suction, and crash cart at bedside. Pt placed on CO2 monitor with 2 L of O2 via nasal cannula being administered. Pt on zoll monitor and full cardiac monitor. Consents signed. Family waiting in waiting room for the procedure. They are updated on the plan.

## 2016-01-03 NOTE — Progress Notes (Addendum)
Patient walked into afib clinic from bus barely able to stand stating not feeling well. Known pt to clinic. Performed EKG showed Afib RVR 144 BP 80/58. Pt vomited while in clinic. Discussed with Rudi Cocoonna Korinna Tat NP - will take to ER for further evaluation possible cardioversion since NPO today. Pt stated she took her normal dose of flecainide this AM and did take a "rescue" pill of cardizem. Pt taken over by ER.  Agree with above plan, pt is unstable with RVR and low BP to try to treat as a outpt.

## 2016-01-03 NOTE — Sedation Documentation (Signed)
120j shock delivered 

## 2016-01-03 NOTE — ED Triage Notes (Signed)
Pt reports to the ED for eval of tachycardia and SOB. Pt reports she is a patient of the A. Fib clinic and this am she had an episode of N/V x 2. Pt denies any nausea at this time. Pt denies any CP. Pt tachycardic in the 130s at this time. Pt also reports she has a URI that she was recently dx with. Pt states she did throw up all of her medications this am. Pt A&Ox4 and skin warm and dry. Some SOB evident with movement and speaking.

## 2016-01-03 NOTE — Progress Notes (Addendum)
CARDIOLOGY CONSULT NOTE  Patient ID: Dawn Braun MRN: [id number] DOB/AGE: [birth year] 50 y.o.  Admit date: 01/03/2016 Primary Physician Dr. Margaretmary Bayley Primary Cardiologist Afib Clinic Chief Complaint  Atrial Fib Requesting  Dr. Jeraldine Loots  HPI:  Dawn Braun is a 50 y.o. female with a h/o PAF that newly diagnosed in the ER 2/2.  She converted with IV cardizem and d/c home on 120 mg cardizem daily and  apixaban for chadsvasc score of 2(female and DM). On 3/27 she had palpitations but was back in sinus by the time she made it to the Afib Clinic.  6/7 she had recurrent symptoms and called EMS.  She converted to NSR in the ED.  She has been treated with flecainide.  She has been treated with Cardizem and Eliquis.  She has a normal EF on echo.  She walked into the EP clinic today with recurrent symptoms and was found to be hypotensive and was sent to the ED.    She was at [facility] ED a couple of days ago with URI.  She was treated symptomatically.  She developed her usual palpitations at 7:30.  She was weak.  She went to work but did not feel well.  She had N/V.  She had a co worker drive her to the office.  BP was 80/58 and she had N/V again.  Of note she did take her prn Cardizem dose today.  In the ED she has been treated with IV Cardizem.  She is being hydrated.  She reports compliant with her Eliquis and her other meds.  She had otherwise been doing OK except for URI like symptoms.    Past Medical History:  Diagnosis Date  . A-fib (HCC)   . Asthma   . Diabetes mellitus without complication (HCC)    type 2  . Hx of tubal ligation     Past Surgical History:  Procedure Laterality Date  . EYE SURGERY Right   . SPLIT NIGHT STUDY  08/06/2015    No Known Allergies   Prior to Admission medications   Medication Sig Start Date End Date Taking? Authorizing Provider  albuterol (PROVENTIL) (2.5 MG/3ML) 0.083% nebulizer solution Take 3 mLs (2.5 mg total) by nebulization every 6 (six)  hours as needed for wheezing or shortness of breath. 01/01/16  Yes Lona Kettle, PA-C  apixaban (ELIQUIS) 5 MG TABS tablet Take 1 tablet (5 mg total) by mouth 2 (two) times daily. 07/11/15  Yes Newman Nip, NP  benzonatate (TESSALON) 100 MG capsule Take 1 capsule (100 mg total) by mouth 3 (three) times daily as needed for cough. 01/01/16  Yes Ashley Laurel Meyer, PA-C  CARTIA XT 120 MG 24 hr capsule TAKE 1 CAPSULE(120 MG) BY MOUTH DAILY 12/04/15  Yes Newman Nip, NP  diltiazem (CARDIZEM) 30 MG tablet Cardizem 30mg  -- take 1 tablet by mouth every 4 hours AS NEEDED for heart rate >100 as long as blood pressure >100. 07/01/15  Yes Newman Nip, NP  Diphenhydramine-PE-APAP (THERAFLU EXPRESSMAX) 12.5-5-325 MG/15ML LIQD Take 1 Dose by mouth 2 (two) times daily as needed (cold symptoms).   Yes Historical Provider, MD  flecainide (TAMBOCOR) 50 MG tablet Take 1 tablet (50 mg total) by mouth 2 (two) times daily. 09/24/15  Yes Newman Nip, NP  guaiFENesin-codeine (CHERATUSSIN AC) 100-10 MG/5ML syrup Take 5 mLs by mouth 3 (three) times daily as needed for cough. 01/01/16  Yes Lona Kettle, PA-C  naproxen sodium (ANAPROX) 220 MG  tablet Take 220 mg by mouth 2 (two) times daily as needed (pain).   Yes Historical Provider, MD  VICTOZA 18 MG/3ML SOPN Inject 1.8 mg into the skin daily. 08/17/15  Yes Historical Provider, MD     (Not in a hospital admission) Family History  Problem Relation Age of Onset  . Epilepsy Father   . Diabetes Mother   . Kidney disease Mother   . Hypertension Mother   . Kidney disease Brother   . Coronary artery disease Brother   . Diabetes Brother     Social History   Social History  . Marital status: Single    Spouse name: N/A  . Number of children: N/A  . Years of education: N/A   Occupational History  . Not on file.   Social History Main Topics  . Smoking status: Never Smoker  . Smokeless tobacco: Never Used  . Alcohol use Yes     Comment:  occasional  . Drug use: No  . Sexual activity: Not on file   Other Topics Concern  . Not on file   Social History Narrative  . No narrative on file     ROS:  Cough.  As stated in the HPI and negative for all other systems.  Physical Exam: Blood pressure 121/85, pulse 90, temperature 98.4 F (36.9 C), temperature source Oral, resp. rate 18, last menstrual period 12/24/2015, SpO2 100 %.  GENERAL:  Well appearing HEENT:  Pupils equal round and reactive, fundi not visualized, oral mucosa unremarkable NECK:  No jugular venous distention, waveform within normal limits, carotid upstroke brisk and symmetric, no bruits, no thyromegaly LYMPHATICS:  No cervical, inguinal adenopathy LUNGS: mildly decreased breath sounds BACK:  No CVA tenderness CHEST:  Unremarkable HEART:  PMI not displaced or sustained,S1 and S2 within normal limits, no S3, no clicks, no rubs, no murmurs, irregular ABD:  Flat, positive bowel sounds normal in frequency in pitch, no bruits, no rebound, no guarding, no midline pulsatile mass, no hepatomegaly, no splenomegaly EXT:  2 plus pulses throughout, no edema, no cyanosis no clubbing SKIN:  No rashes no nodules NEURO:  Cranial nerves II through XII grossly intact, motor grossly intact throughout PSYCH:  Cognitively intact, oriented to person place and time  Labs: Lab Results  Component Value Date   BUN 16 01/01/2016   Lab Results  Component Value Date   CREATININE 0.68 01/01/2016   Lab Results  Component Value Date   NA 138 01/01/2016   K 4.3 01/01/2016   CL 108 01/01/2016   CO2 23 01/01/2016   Lab Results  Component Value Date   TROPONINI <0.03 01/03/2016   Lab Results  Component Value Date   WBC 10.2 01/03/2016   HGB 12.1 01/03/2016   HCT 37.3 01/03/2016   MCV 86.5 01/03/2016   PLT 393 01/03/2016   No results found for: CHOL, HDL, LDLCALC, LDLDIRECT, TRIG, CHOLHDL Lab Results  Component Value Date   ALT 19 07/01/2015   AST 20 07/01/2015    ALKPHOS 88 07/01/2015   BILITOT 0.5 07/01/2015     Lab Results  Component Value Date   TSH 0.936 01/03/2016    Radiology:   CXR: Cardiomegaly. Mild elevation of the right hemidiaphragm again noted. No acute infiltrate or pleural effusion.  ZOX:WRUEAVEKG:Atrial fib, rate 127, axis WNL, intervals WNL.  No acute ST T wave changes.  01/03/2016  ASSESSMENT AND PLAN:   ATRIAL FIB:  Plan cardioversion in the ED.  Increase flecainide.  Need to reschedule  POET (Plain Old Exercise Treadmill)  URI:  No infiltrate on CXR.    DM:  Last A1C by her report was 5.1.  Continue current therapy.   SignedRollene Rotunda 01/03/2016, 2:11 PM

## 2016-01-06 ENCOUNTER — Telehealth (HOSPITAL_COMMUNITY): Payer: Self-pay | Admitting: *Deleted

## 2016-01-06 NOTE — Telephone Encounter (Signed)
LMOM for pt to rtn call to sched f/u

## 2016-01-06 NOTE — Telephone Encounter (Signed)
-----   Message from Shona SimpsonStacy S Carter, RN sent at 01/06/2016  8:50 AM EDT ----- Regarding: FW: appt needed   ----- Message ----- From: Dewain PenningPatricia H Trent Sent: 01/03/2016   5:01 PM To: Newman Niponna C Carroll, NP Subject: appt needed                                    Dr Antoine PocheHochrein cardioverted this pt in the ER, he wants you to see them next week. Please have someone call patient with an appt.  Thanks Trisha

## 2016-01-14 ENCOUNTER — Ambulatory Visit (HOSPITAL_COMMUNITY)
Admission: RE | Admit: 2016-01-14 | Discharge: 2016-01-14 | Disposition: A | Discharge status: Home / Self Care | Payer: 59 | Source: Ambulatory Visit | Attending: Nurse Practitioner | Admitting: Nurse Practitioner

## 2016-01-14 ENCOUNTER — Encounter (HOSPITAL_COMMUNITY): Payer: Self-pay | Admitting: Nurse Practitioner

## 2016-01-14 DIAGNOSIS — I445 Left posterior fascicular block: Secondary | ICD-10-CM | POA: Diagnosis not present

## 2016-01-14 DIAGNOSIS — I480 Paroxysmal atrial fibrillation: Secondary | ICD-10-CM

## 2016-01-14 DIAGNOSIS — Z79899 Other long term (current) drug therapy: Secondary | ICD-10-CM | POA: Insufficient documentation

## 2016-01-14 DIAGNOSIS — Z7901 Long term (current) use of anticoagulants: Secondary | ICD-10-CM | POA: Insufficient documentation

## 2016-01-14 DIAGNOSIS — I4891 Unspecified atrial fibrillation: Secondary | ICD-10-CM | POA: Diagnosis present

## 2016-01-14 DIAGNOSIS — R9431 Abnormal electrocardiogram [ECG] [EKG]: Secondary | ICD-10-CM | POA: Insufficient documentation

## 2016-01-14 HISTORY — DX: Paroxysmal atrial fibrillation: I48.0

## 2016-01-14 NOTE — Progress Notes (Signed)
Primary Care Physician: No primary care provider on file. Referring : [facility] ER  Dawn Braun is a 50 y.o. female with a history of paroxysmal atrial fibrillation who presents for follow up in the [facility].  She was recently seen in the ER again on 01/03/16 and underwent repeat DCCV at that time for symptomatic atrial fibrillation.  Her Flecainide was increased to 100mg  twice daily and she presents today for follow up. Today, she denies symptoms of palpitations, chest pain, shortness of breath, orthopnea, PND, lower extremity edema, dizziness, presyncope, syncope, snoring, daytime somnolence, bleeding, or neurologic sequela. The patient is tolerating medications without difficulties and is otherwise without complaint today.    Atrial Fibrillation Risk Factors:  she does have symptoms of sleep apnea - sleep study negative 08/2015  she does not have a history of rheumatic fever.  she does not have a history of alcohol use.  she has a BMI of Body mass index is 34.17 kg/m.[name] Filed Weights   01/14/16 0840  Weight: 186 lb 12.8 oz (84.7 kg)    LA size: 40 (echo 07/2015)   Atrial Fibrillation Management history:  Previous antiarrhythmic drugs: Flecainide  Previous cardioversions: 05/2015; 09/2015; 12/2015; 12/2015  Previous ablations: none  CHADS2VASC score: 2  Anticoagulation history: Eliquis   Past Medical History:  Diagnosis Date  . Asthma   . Diabetes mellitus without complication (HCC)    type 2  . Paroxysmal atrial fibrillation [facility])    Past Surgical History:  Procedure Laterality Date  . EYE SURGERY Right   . KNEE ARTHROSCOPY Right   . SPLIT NIGHT STUDY  08/06/2015  . TUBAL LIGATION      Current Outpatient Prescriptions  Medication Sig Dispense Refill  . apixaban (ELIQUIS) 5 MG TABS tablet Take 1 tablet (5 mg total) by mouth 2 (two) times daily. 60 tablet 11  . benzonatate (TESSALON) 100 MG capsule Take 1 capsule (100 mg total) by  mouth 3 (three) times daily as needed for cough. 21 capsule 0  . CARTIA XT 120 MG 24 hr capsule TAKE 1 CAPSULE(120 MG) BY MOUTH DAILY 30 capsule 0  . diltiazem (CARDIZEM) 30 MG tablet Cardizem 30mg  -- take 1 tablet by mouth every 4 hours AS NEEDED for heart rate >100 as long as blood pressure >100. 45 tablet 3  . flecainide (TAMBOCOR) 100 MG tablet Take 1 tablet (100 mg total) by mouth 2 (two) times daily. 60 tablet 0  . naproxen sodium (ANAPROX) 220 MG tablet Take 220 mg by mouth 2 (two) times daily as needed (pain).    [name] VICTOZA 18 MG/3ML SOPN Inject 1.8 mg into the skin daily.  3  . albuterol (PROVENTIL) (2.5 MG/3ML) 0.083% nebulizer solution Take 3 mLs (2.5 mg total) by nebulization every 6 (six) hours as needed for wheezing or shortness of breath. 30 mL 0  . guaiFENesin-codeine (CHERATUSSIN AC) 100-10 MG/5ML syrup Take 5 mLs by mouth 3 (three) times daily as needed for cough. (Patient not taking: Reported on 01/14/2016) 100 mL 0   No current facility-administered medications for this encounter.     No Known Allergies  Social History   Social History  . Marital status: Single    Spouse name: N/A  . Number of children: 2  . Years of education: N/A   Occupational History  . [employer]    Social History Main Topics  . Smoking status: Never Smoker  . Smokeless tobacco: Never Used  . Alcohol use  Yes     Comment: occasional  . Drug use: No  . Sexual activity: Not on file   Other Topics Concern  . Not on file   Social History Narrative  . No narrative on file    Family History  Problem Relation Age of Onset  . Epilepsy Father   . Diabetes Mother   . Kidney disease Mother   . Hypertension Mother   . Kidney disease Brother   . Diabetic kidney disease Brother   . Diabetes Brother     ROS- All systems are reviewed and negative except as per the HPI above.  Physical Exam: Vitals:   01/14/16 0840  BP: 134/82  Pulse: 73  Weight: 186 lb 12.8 oz (84.7 kg)    Height: 5\' 2"  (1.575 m)    GEN- The patient is well appearing, alert and oriented x 3 today.   Head- normocephalic, atraumatic Eyes-  Sclera clear, conjunctiva pink Ears- hearing intact Oropharynx- clear Neck- supple  Lungs- Clear to ausculation bilaterally, normal work of breathing Heart- Regular rate and rhythm, no murmurs, rubs or gallops  GI- soft, NT, ND, + BS Extremities- no clubbing, cyanosis, or edema MS- no significant deformity or atrophy Skin- no rash or lesion Psych- euthymic mood, full affect Neuro- strength and sensation are intact  Wt Readings from Last 3 Encounters:  01/14/16 186 lb 12.8 oz (84.7 kg)  01/01/16 187 lb (84.8 kg)  09/24/15 187 lb 12.8 oz (85.2 kg)    EKG today demonstrates sinus rhythm, rate 73 Echo 07/2015 demonstrated EF 60-65%, no RWMA, mild MR  Epic records are reviewed at length today  Assessment and Plan:  1. Paroxysmal atrial fibrillation The patient has symptomatic paroxysmal atrial fibrillation. Flecainide increased to 100mg  twice daily at recent ER visit.  EKG stable today.  Continue Eliquis for CHADS2VASC of 2 Will schedule ETT Extensive discussion of lifestyle modification had today. Her job requires 12 hour shifts and she is limited in her ability to walk during the day. She will look for ways to increase activity.   Follow up with AF clinic 3 months    Gypsy BalsamAmber Shawon Denzer, NP 01/14/2016 8:52 AM

## 2016-01-24 ENCOUNTER — Encounter (HOSPITAL_COMMUNITY): Payer: 59

## 2016-01-30 ENCOUNTER — Other Ambulatory Visit (HOSPITAL_COMMUNITY): Payer: Self-pay | Admitting: Nurse Practitioner

## 2016-01-30 ENCOUNTER — Encounter: Payer: Self-pay | Admitting: Nurse Practitioner

## 2016-01-31 ENCOUNTER — Ambulatory Visit (INDEPENDENT_AMBULATORY_CARE_PROVIDER_SITE_OTHER): Payer: 59

## 2016-01-31 DIAGNOSIS — I480 Paroxysmal atrial fibrillation: Secondary | ICD-10-CM

## 2016-01-31 LAB — EXERCISE TOLERANCE TEST
Estimated workload: 8.5 METS
Exercise duration (min): 7 min
Exercise duration (sec): 0 s
MPHR: 169 {beats}/min
Peak HR: 150 {beats}/min
Percent HR: 89 %
Percent of predicted max HR: 88 %
RPE: 18
Rest HR: 80 {beats}/min
Stage 1 DBP: 83 mmHg
Stage 1 Grade: 0 %
Stage 1 HR: 84 {beats}/min
Stage 1 SBP: 140 mmHg
Stage 1 Speed: 0 mph
Stage 2 Grade: 0 %
Stage 2 HR: 85 {beats}/min
Stage 2 Speed: 1 mph
Stage 3 Grade: 0 %
Stage 3 HR: 86 {beats}/min
Stage 3 Speed: 1 mph
Stage 4 DBP: 79 mmHg
Stage 4 Grade: 10 %
Stage 4 HR: 127 {beats}/min
Stage 4 SBP: 155 mmHg
Stage 4 Speed: 1.7 mph
Stage 5 DBP: 77 mmHg
Stage 5 Grade: 12 %
Stage 5 HR: 136 {beats}/min
Stage 5 SBP: 191 mmHg
Stage 5 Speed: 2.5 mph
Stage 6 Grade: 14 %
Stage 6 HR: 150 {beats}/min
Stage 6 Speed: 3.4 mph
Stage 7 DBP: 74 mmHg
Stage 7 Grade: 0 %
Stage 7 HR: 130 {beats}/min
Stage 7 SBP: 168 mmHg
Stage 7 Speed: 0 mph
Stage 8 DBP: 67 mmHg
Stage 8 Grade: 0 %
Stage 8 HR: 100 {beats}/min
Stage 8 SBP: 142 mmHg
Stage 8 Speed: 0 mph

## 2016-03-03 ENCOUNTER — Other Ambulatory Visit (HOSPITAL_COMMUNITY): Payer: Self-pay | Admitting: *Deleted

## 2016-03-03 MED ORDER — FLECAINIDE ACETATE 100 MG PO TABS: 100.0000 mg | ORAL_TABLET | Freq: Two times a day (BID) | ORAL | 6 refills | Status: DC

## 2016-04-14 ENCOUNTER — Inpatient Hospital Stay (HOSPITAL_COMMUNITY): Admission: RE | Admit: 2016-04-14 | Payer: 59 | Source: Ambulatory Visit | Admitting: Nurse Practitioner

## 2016-04-14 ENCOUNTER — Encounter (HOSPITAL_COMMUNITY): Payer: Self-pay | Admitting: *Deleted

## 2016-05-01 DIAGNOSIS — N951 Menopausal and female climacteric states: Secondary | ICD-10-CM | POA: Insufficient documentation

## 2016-05-19 ENCOUNTER — Ambulatory Visit (HOSPITAL_COMMUNITY)
Admission: RE | Admit: 2016-05-19 | Discharge: 2016-05-19 | Disposition: A | Discharge status: Home / Self Care | Payer: 59 | Source: Ambulatory Visit | Attending: Nurse Practitioner | Admitting: Nurse Practitioner

## 2016-05-19 ENCOUNTER — Encounter (HOSPITAL_COMMUNITY): Payer: Self-pay | Admitting: Nurse Practitioner

## 2016-05-19 VITALS — BP 146/88 | HR 75 | Ht 62.0 in

## 2016-05-19 DIAGNOSIS — R5383 Other fatigue: Secondary | ICD-10-CM

## 2016-05-19 DIAGNOSIS — I480 Paroxysmal atrial fibrillation: Secondary | ICD-10-CM | POA: Diagnosis not present

## 2016-05-19 DIAGNOSIS — R5381 Other malaise: Secondary | ICD-10-CM | POA: Insufficient documentation

## 2016-05-19 DIAGNOSIS — E119 Type 2 diabetes mellitus without complications: Secondary | ICD-10-CM | POA: Diagnosis present

## 2016-05-19 DIAGNOSIS — Z79899 Other long term (current) drug therapy: Secondary | ICD-10-CM | POA: Diagnosis not present

## 2016-05-19 DIAGNOSIS — Z820 Family history of epilepsy and other diseases of the nervous system: Secondary | ICD-10-CM | POA: Insufficient documentation

## 2016-05-19 DIAGNOSIS — Z8249 Family history of ischemic heart disease and other diseases of the circulatory system: Secondary | ICD-10-CM | POA: Insufficient documentation

## 2016-05-19 DIAGNOSIS — Z7984 Long term (current) use of oral hypoglycemic drugs: Secondary | ICD-10-CM | POA: Diagnosis not present

## 2016-05-19 DIAGNOSIS — J45909 Unspecified asthma, uncomplicated: Secondary | ICD-10-CM | POA: Diagnosis not present

## 2016-05-19 DIAGNOSIS — Z833 Family history of diabetes mellitus: Secondary | ICD-10-CM | POA: Diagnosis not present

## 2016-05-19 DIAGNOSIS — Z7901 Long term (current) use of anticoagulants: Secondary | ICD-10-CM | POA: Insufficient documentation

## 2016-05-19 DIAGNOSIS — Z841 Family history of disorders of kidney and ureter: Secondary | ICD-10-CM | POA: Diagnosis not present

## 2016-05-19 LAB — CBC
HCT: 36.7 % (ref 36.0–46.0)
Hemoglobin: 12 g/dL (ref 12.0–15.0)
MCH: 28 pg (ref 26.0–34.0)
MCHC: 32.7 g/dL (ref 30.0–36.0)
MCV: 85.7 fL (ref 78.0–100.0)
Platelets: 319 10*3/uL (ref 150–400)
RBC: 4.28 MIL/uL (ref 3.87–5.11)
RDW: 13.3 % (ref 11.5–15.5)
WBC: 7.1 10*3/uL (ref 4.0–10.5)

## 2016-05-19 LAB — COMPREHENSIVE METABOLIC PANEL
ALT: 13 U/L — ABNORMAL LOW (ref 14–54)
AST: 19 U/L (ref 15–41)
Albumin: 3.4 g/dL — ABNORMAL LOW (ref 3.5–5.0)
Alkaline Phosphatase: 82 U/L (ref 38–126)
Anion gap: 8 (ref 5–15)
BUN: 13 mg/dL (ref 6–20)
CO2: 25 mmol/L (ref 22–32)
Calcium: 9 mg/dL (ref 8.9–10.3)
Chloride: 106 mmol/L (ref 101–111)
Creatinine, Ser: 0.68 mg/dL (ref 0.44–1.00)
GFR calc Af Amer: >60 mL/min (ref >60)
GFR calc non Af Amer: >60 mL/min (ref >60)
Glucose, Bld: 103 mg/dL — ABNORMAL HIGH (ref 65–99)
Potassium: 4 mmol/L (ref 3.5–5.1)
Sodium: 139 mmol/L (ref 135–145)
Total Bilirubin: 0.4 mg/dL (ref 0.3–1.2)
Total Protein: 6.6 g/dL (ref 6.5–8.1)

## 2016-05-19 LAB — TSH: TSH: 1.478 u[IU]/mL (ref 0.350–4.500)

## 2016-05-19 NOTE — Progress Notes (Signed)
Primary Care Physician: Laurena SlimmerLARK,PRESTON S, MD [facility] Phys[facility]   Dawn ComaMichelle L Braun is a 51 y.o. female with a h/o DM, paroxsymal afib that is on flecainide. Pt showed up in the lobby saying she did not feel well. Has been a no show for last two scheduled visits.EKG shows SR. She says that she has been working 16 hours shifts and has been exhausted since this weekend. No palpitations. No URI symptoms, no GI symptoms. No bleeding issues. No change in meds or general health condition. No afib burden. States her sugars have been well controlled.  Today, she denies symptoms of palpitations, chest pain, shortness of breath, orthopnea, PND, lower extremity edema, dizziness, presyncope, syncope, or neurologic sequela. The patient is tolerating medications without difficulties and is otherwise without complaint today.   Past Medical History:  Diagnosis Date  . Asthma   . Diabetes mellitus without complication (HCC)    type 2  . Paroxysmal atrial fibrillation [facility](HCC)    Past Surgical History:  Procedure Laterality Date  . EYE SURGERY Right   . KNEE ARTHROSCOPY Right   . SPLIT NIGHT STUDY  08/06/2015  . TUBAL LIGATION      Current Outpatient Prescriptions  Medication Sig Dispense Refill  . albuterol (PROVENTIL) (2.5 MG/3ML) 0.083% nebulizer solution Take 3 mLs (2.5 mg total) by nebulization every 6 (six) hours as needed for wheezing or shortness of breath. 30 mL 0  . apixaban (ELIQUIS) 5 MG TABS tablet Take 1 tablet (5 mg total) by mouth 2 (two) times daily. 60 tablet 11  . CARTIA XT 120 MG 24 hr capsule TAKE ONE CAPSULE BY MOUTH DAILY 30 capsule 3  . diltiazem (CARDIZEM) 30 MG tablet Cardizem 30mg  -- take 1 tablet by mouth every 4 hours AS NEEDED for heart rate >100 as long as blood pressure >100. 45 tablet 3  . flecainide (TAMBOCOR) 100 MG tablet Take 1 tablet (100 mg total) by mouth 2 (two) times daily. 60 tablet 6  . naproxen sodium (ANAPROX) 220 MG tablet Take 220 mg by mouth 2 (two)  times daily as needed (pain).    [name]. VICTOZA 18 MG/3ML SOPN Inject 1.8 mg into the skin daily.  3   No current facility-administered medications for this encounter.     No Known Allergies  Social History   Social History  . Marital status: Single    Spouse name: N/A  . Number of children: 2  . Years of education: N/A   Occupational History  . [employer]    Social History Main Topics  . Smoking status: Never Smoker  . Smokeless tobacco: Never Used  . Alcohol use Yes     Comment: occasional  . Drug use: No  . Sexual activity: Not on file   Other Topics Concern  . Not on file   Social History Narrative  . No narrative on file    Family History  Problem Relation Age of Onset  . Epilepsy Father   . Diabetes Mother   . Kidney disease Mother   . Hypertension Mother   . Kidney disease Brother   . Diabetic kidney disease Brother   . Diabetes Brother     ROS- All systems are reviewed and negative except as per the HPI above  Physical Exam: Vitals:   05/19/16 1324  BP: (!) 146/88  Pulse: 75  Height: 5\' 2"  (1.575 m)   Wt Readings from Last 3 Encounters:  01/14/16 186 lb 12.8 oz (84.7 kg)  01/01/16 187 lb (84.8 kg)  09/24/15 187 lb 12.8 oz (85.2 kg)    Labs: Lab Results  Component Value Date   NA 138 01/01/2016   K 4.3 01/01/2016   CL 108 01/01/2016   CO2 23 01/01/2016   GLUCOSE 126 (H) 01/01/2016   BUN 16 01/01/2016   CREATININE 0.68 01/01/2016   CALCIUM 9.1 01/01/2016   MG 1.9 01/03/2016   Lab Results  Component Value Date   INR 1.20 09/11/2015   No results found for: CHOL, HDL, LDLCALC, TRIG   GEN- The patient is well appearing, alert and oriented x 3 today.   Head- normocephalic, atraumatic Eyes-  Sclera clear, conjunctiva pink Ears- hearing intact Oropharynx- clear Neck- supple, no JVP Lymph- no cervical lymphadenopathy Lungs- Clear to ausculation bilaterally, normal work of breathing Heart- Regular rate and rhythm, no murmurs,  rubs or gallops, PMI not laterally displaced GI- soft, NT, ND, + BS Extremities- no clubbing, cyanosis, or edema MS- no significant deformity or atrophy Skin- no rash or lesion Psych- euthymic mood, full affect Neuro- strength and sensation are intact  EKG- SR at 75 bpm, LAFB, pr int 180 ms, qrs int 86 ms, qtc 442 ms Epic records reviewed    Assessment and Plan: 1. Afib No occurrence of afib Taking flecanide 100 mg bid  Continues on Eliquis for CHA2DS2VASc score of at least 2  2. Malaise  No obvious signs of illness Cmet,cbc, flecainide level, TSH Afternoon off work note given  F/u with PCP if does not improve  F/u here in 3 months  She was respectfully asked to call ahead for urgent appointments and to please show up for scheduled appointments.   Elvina Sidle Matthew Folks [facility] [address] [place], [place] [address] [phone]

## 2016-05-20 LAB — FLECAINIDE LEVEL: Flecainide: 0.28 ug/mL (ref 0.20–1.00)

## 2016-05-30 ENCOUNTER — Other Ambulatory Visit (HOSPITAL_COMMUNITY): Payer: Self-pay | Admitting: Nurse Practitioner

## 2016-06-02 ENCOUNTER — Other Ambulatory Visit (HOSPITAL_COMMUNITY): Payer: Self-pay | Admitting: Nurse Practitioner

## 2016-06-17 ENCOUNTER — Emergency Department (HOSPITAL_COMMUNITY)
Admission: EM | Admit: 2016-06-17 | Discharge: 2016-06-17 | Disposition: A | Discharge status: Home / Self Care | Payer: 59 | Attending: Emergency Medicine | Admitting: Emergency Medicine

## 2016-06-17 ENCOUNTER — Encounter (HOSPITAL_COMMUNITY): Payer: Self-pay | Admitting: Emergency Medicine

## 2016-06-17 ENCOUNTER — Emergency Department (HOSPITAL_COMMUNITY): Payer: 59

## 2016-06-17 DIAGNOSIS — N12 Tubulo-interstitial nephritis, not specified as acute or chronic: Secondary | ICD-10-CM | POA: Insufficient documentation

## 2016-06-17 DIAGNOSIS — R935 Abnormal findings on diagnostic imaging of other abdominal regions, including retroperitoneum: Secondary | ICD-10-CM | POA: Insufficient documentation

## 2016-06-17 DIAGNOSIS — Z79899 Other long term (current) drug therapy: Secondary | ICD-10-CM | POA: Insufficient documentation

## 2016-06-17 DIAGNOSIS — E119 Type 2 diabetes mellitus without complications: Secondary | ICD-10-CM | POA: Insufficient documentation

## 2016-06-17 DIAGNOSIS — J45909 Unspecified asthma, uncomplicated: Secondary | ICD-10-CM | POA: Insufficient documentation

## 2016-06-17 DIAGNOSIS — Z7901 Long term (current) use of anticoagulants: Secondary | ICD-10-CM | POA: Diagnosis not present

## 2016-06-17 DIAGNOSIS — R3129 Other microscopic hematuria: Secondary | ICD-10-CM

## 2016-06-17 DIAGNOSIS — M545 Low back pain: Secondary | ICD-10-CM | POA: Diagnosis present

## 2016-06-17 LAB — URINALYSIS, ROUTINE W REFLEX MICROSCOPIC
Bacteria, UA: NONE SEEN
Bilirubin Urine: NEGATIVE
Glucose, UA: 50 mg/dL — AB
Ketones, ur: NEGATIVE mg/dL
Leukocytes, UA: NEGATIVE
Nitrite: NEGATIVE
Protein, ur: 100 mg/dL — AB
Specific Gravity, Urine: 1.03 (ref 1.005–1.030)
pH: 6 (ref 5.0–8.0)

## 2016-06-17 MED ORDER — STERILE WATER FOR INJECTION IJ SOLN
INTRAMUSCULAR | Status: AC
  Administered 2016-06-17: 2.1 mL
  Filled 2016-06-17: qty 10

## 2016-06-17 MED ORDER — CEPHALEXIN 500 MG PO CAPS: 1000.0000 mg | ORAL_CAPSULE | Freq: Two times a day (BID) | ORAL | 0 refills | Status: DC

## 2016-06-17 MED ORDER — ONDANSETRON 4 MG PO TBDP
4.0000 mg | ORAL_TABLET | Freq: Once | ORAL | Status: AC
  Administered 2016-06-17: 4 mg via ORAL
  Filled 2016-06-17: qty 1

## 2016-06-17 MED ORDER — HYDROCODONE-ACETAMINOPHEN 5-325 MG PO TABS
2.0000 | ORAL_TABLET | Freq: Once | ORAL | Status: AC
  Administered 2016-06-17: 2 via ORAL
  Filled 2016-06-17: qty 2

## 2016-06-17 MED ORDER — CEFTRIAXONE SODIUM 1 G IJ SOLR
1.0000 g | Freq: Once | INTRAMUSCULAR | Status: AC
  Administered 2016-06-17: 1 g via INTRAMUSCULAR
  Filled 2016-06-17: qty 10

## 2016-06-17 MED ORDER — ONDANSETRON HCL 4 MG PO TABS: 4.0000 mg | ORAL_TABLET | Freq: Four times a day (QID) | ORAL | 0 refills | Status: DC

## 2016-06-17 MED ORDER — HYDROCODONE-ACETAMINOPHEN 5-325 MG PO TABS: 2.0000 | ORAL_TABLET | Freq: Four times a day (QID) | ORAL | 0 refills | Status: DC | PRN

## 2016-06-17 NOTE — ED Triage Notes (Signed)
Patient is complaining of generalized back pain starting Saturday. Denies any urinary symptoms, numbness/tingling, or any known injury to the area. Patient ambulated to triage with a stooped over gait. Reports pain increases with movement.

## 2016-06-17 NOTE — ED Provider Notes (Signed)
[facility]-EMERGENCY DEPT Provider Note   CSN: [id number] Arrival date & time: 06/17/16  7829     History   Chief Complaint Chief Complaint  Patient presents with  . Back Pain    HPI Dawn Braun is a 51 y.o. female.  HPI   51 year old female with history of paroxysmal atrial fibrillation currently on Eliquis, diabetes, asthma presenting for evaluation of back pain.Patient report gradual onset of pain to her left low back ongoing for the past 5 days. Her pain is described as an achy sharp pain worsening with movement or with palpation. She tries heating pad, Aleve with out adequate improvement. Her pain is nonradiating, no associated fever, chills, lightheadedness, dizziness, abdominal pain, lower extremity pain, bowel bladder incontinence, saddle anesthesia, or rash. No history of IV drug use or active cancer. No abnormal bleeding. No history of kidney stones and also denies dysuria or hematuria. 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 At home she sleep with her dog in the bed and sometimes sleep on one side of her body.      Past Medical History:  Diagnosis Date  . Asthma   . Diabetes mellitus without complication (HCC)    type 2  . Paroxysmal atrial fibrillation (HCC)     There are no active problems to display for this patient.   Past Surgical History:  Procedure Laterality Date  . EYE SURGERY Right   . KNEE ARTHROSCOPY Right   . SPLIT NIGHT STUDY  08/06/2015  . TUBAL LIGATION      OB History    No data available       Home Medications    Prior to Admission medications   Medication Sig Start Date End Date Taking? Authorizing Provider  albuterol (PROVENTIL) (2.5 MG/3ML) 0.083% nebulizer solution Take 3 mLs (2.5 mg total) by nebulization every 6 (six) hours as needed for wheezing or shortness of breath. 01/01/16   Lona Kettle, PA-C  apixaban (ELIQUIS) 5 MG TABS tablet Take 1 tablet (5 mg total) by mouth 2  (two) times daily. 07/11/15   Newman Nip, NP  CARTIA XT 120 MG 24 hr capsule TAKE ONE CAPSULE BY MOUTH DAILY 06/01/16   Newman Nip, NP  diltiazem (CARDIZEM) 30 MG tablet Cardizem 30mg  -- take 1 tablet by mouth every 4 hours AS NEEDED for heart rate >100 as long as blood pressure >100. 07/01/15   Newman Nip, NP  ELIQUIS 5 MG TABS tablet TAKE 1 TABLET(5 MG) BY MOUTH TWICE DAILY 06/02/16   Newman Nip, NP  flecainide (TAMBOCOR) 100 MG tablet Take 1 tablet (100 mg total) by mouth 2 (two) times daily. 03/03/16   Newman Nip, NP  naproxen sodium (ANAPROX) 220 MG tablet Take 220 mg by mouth 2 (two) times daily as needed (pain).    Historical Provider, MD  VICTOZA 18 MG/3ML SOPN Inject 1.8 mg into the skin daily. 08/17/15   Historical Provider, MD    Family History Family History  Problem Relation Age of Onset  . Epilepsy Father   . Diabetes Mother   . Kidney disease Mother   . Hypertension Mother   . Kidney disease Brother   . Diabetic kidney disease Brother   . Diabetes Brother     Social History Social History  Substance Use Topics  . Smoking status: Never Smoker  . Smokeless tobacco: Never Used  . Alcohol use Yes  Comment: occasional     Allergies   Patient has no known allergies.   Review of Systems Review of Systems  All other systems reviewed and are negative.    Physical Exam Updated Vital Signs BP 146/79 (BP Location: Left Arm)   Pulse 78   Temp 97.9 F (36.6 C) (Oral)   Resp 18   Ht 5\' 2"  (1.575 m)   Wt 87.5 kg   SpO2 99%   BMI 35.30 kg/m   Physical Exam  Constitutional: She appears well-developed and well-nourished. No distress.  Obese female sitting in the chair in no acute discomfort.  HENT:  Head: Atraumatic.  Eyes: Conjunctivae are normal.  Neck: Neck supple.  Cardiovascular: Normal rate, regular rhythm and intact distal pulses.   Pulmonary/Chest: Effort normal and breath sounds normal.  Abdominal: Soft. There is no tenderness.   Musculoskeletal: She exhibits tenderness (Tenderness along left lower back on palpation without any overlying skin changes. No significant midline spine tenderness, crepitus, step-off. Normal left hip range of motion.).  Neurological: She is alert.  Patellar deep tendon reflexes decreased bilaterally. Normal flexion and extensions of her feet. Dorsalis pedal pulse palpable.  Skin: No rash noted.  Psychiatric: She has a normal mood and affect.  Nursing note and vitals reviewed.    ED Treatments / Results  Labs (all labs ordered are listed, but only abnormal results are displayed) Labs Reviewed  URINALYSIS, ROUTINE W REFLEX MICROSCOPIC - Abnormal; Notable for the following:       Result Value   APPearance CLOUDY (*)    Glucose, UA 50 (*)    Hgb urine dipstick LARGE (*)    Protein, ur 100 (*)    Squamous Epithelial / LPF 6-30 (*)    All other components within normal limits  URINE CULTURE    EKG  EKG Interpretation None       Radiology Ct Renal Stone Study  Result Date: 06/17/2016 CLINICAL DATA:  Severe right back pain for 4 days EXAM: CT ABDOMEN AND PELVIS WITHOUT CONTRAST TECHNIQUE: Multidetector CT imaging of the abdomen and pelvis was performed following the standard protocol without IV contrast. COMPARISON:  None. FINDINGS: Lower chest: No acute abnormality. Hepatobiliary: No focal liver abnormality is seen. Multiple cholelithiasis without gallbladder wall thickening or biliary dilatation. Pancreas: Unremarkable. No pancreatic ductal dilatation or surrounding inflammatory changes. Spleen: Normal in size without focal abnormality. Adrenals/Urinary Tract: Normal adrenal glands. No hydronephrosis. No renal mass. Bilateral nonspecific perinephric stranding. Hyperdense material within the bladder concerning for hemorrhage. Stomach/Bowel: Stomach is within normal limits. Moderate amount of stool throughout the colon. Appendix appears normal. No evidence of bowel wall thickening,  distention, or inflammatory changes. Vascular/Lymphatic: No significant vascular findings are present. No enlarged abdominal or pelvic lymph nodes. Reproductive: Enlarged uterus with multiple masses consistent with a fibroid uterus. Other: No abdominal wall hernia or abnormality. No abdominopelvic ascites. Musculoskeletal: No acute or significant osseous findings. IMPRESSION: 1. Hyperdense material within the bladder concerning for hemorrhage. Bilateral nonspecific perinephric stranding. 2. No urolithiasis. 3. Cholelithiasis. 4. Fibroid uterus. Electronically Signed   By: Elige Ko   On: 06/17/2016 14:10    Procedures Procedures (including critical care time)  Medications Ordered in ED Medications  cefTRIAXone (ROCEPHIN) injection 1 g (1 g Intramuscular Given 06/17/16 1513)  HYDROcodone-acetaminophen (NORCO/VICODIN) 5-325 MG per tablet 2 tablet (2 tablets Oral Given 06/17/16 1513)  ondansetron (ZOFRAN-ODT) disintegrating tablet 4 mg (4 mg Oral Given 06/17/16 1513)  sterile water (preservative free) injection (2.1 mLs  Given  06/17/16 1514)     Initial Impression / Assessment and Plan / ED Course  I have reviewed the triage vital signs and the nursing notes.  Pertinent labs & imaging results that were available during my care of the patient were reviewed by me and considered in my medical decision making (see chart for details).     BP 159/91 (BP Location: Left Arm)   Pulse 78   Temp 97.9 F (36.6 C) (Oral)   Resp 18   Ht 5\' 2"  (1.575 m)   Wt 87.5 kg   SpO2 100%   BMI 35.30 kg/m    Final Clinical Impressions(s) / ED Diagnoses   Final diagnoses:  Other microscopic hematuria  Pyelonephritis    New Prescriptions New Prescriptions   CEPHALEXIN (KEFLEX) 500 MG CAPSULE    Take 2 capsules (1,000 mg total) by mouth 2 (two) times daily.   HYDROCODONE-ACETAMINOPHEN (NORCO/VICODIN) 5-325 MG TABLET    Take 2 tablets by mouth every 6 (six) hours as needed for moderate pain.   ONDANSETRON  (ZOFRAN) 4 MG TABLET    Take 1 tablet (4 mg total) by mouth every 6 (six) hours.   10:56 AM Patient presents with reproducible left lower back pain. No red flags. She is able to ambulate. She is neurovascularly intact. Symptoms does not suggest aortic dissection, spinal infection, any Bony pathology or kidney stones.  2:39 PM UA shows large amount of Hgb, 6-30 WBC.  CT scan demonstreatse hyperdense material within the bladder concerning for hemorrhage.  Bilateral nonspecific perinephris stranding.  No kidney stone.  I discussed this finding with Dr. Donnald GarrePfeiffer, we plan to treat pt for suspected pyelo with Keflex x 10 days.  Urine culture sent.  Pt currently on Eliquis, recommend close f/u with urology for further evaluation of her hematuria.       Fayrene HelperBowie Jameer Storie, PA-C 06/17/16 1534    Arby BarretteMarcy Pfeiffer, MD 06/18/16 [phone]

## 2016-06-17 NOTE — ED Notes (Signed)
Bed: WTR6 Expected date:  Expected time:  Means of arrival:  Comments: 

## 2016-06-17 NOTE — Discharge Instructions (Signed)
You have blood in your urine and your CT scan shows blood in your bladder and swelling to both of your ureter.  This could be due to a kidney infection.  Please take antibiotic as prescribed for the full duration.  However, please call and schedule an appointment with urology in 1 week for further evaluation of the bloody urine since you are currently on blood thinner medication.  Return to the ER if your condition worsen.  Take pain medication as needed but avoid driving or operate heavy machinery as it can cause drowsiness.

## 2016-06-18 LAB — URINE CULTURE: Culture: >=100000 — AB

## 2016-06-19 ENCOUNTER — Telehealth: Payer: Self-pay | Admitting: *Deleted

## 2016-06-19 NOTE — Telephone Encounter (Signed)
Post ED Visit - Positive Culture Follow-up  Culture report reviewed by antimicrobial stewardship pharmacist:  []  Enzo BiNathan Batchelder, Pharm.D. []  Celedonio MiyamotoJeremy Frens, Pharm.D., BCPS []  Garvin FilaMike Maccia, Pharm.D. []  Georgina PillionElizabeth Martin, Pharm.D., BCPS []  DallasMinh Pham, [address]Pharm[address]D., BCPS, AAHIVP [x]  Estella HuskMichelle Turner, Pharm.D., BCPS, AAHIVP []  Cassie Stewart, [address]Pharm[address]D. []  Sherle Poeob Vincent, [place]arm[place]D.  Positive urine culture Treated with Cephalexin, organism sensitive to the same and no further patient follow-up is required at this time.  Virl AxeRobertson, Filbert Craze [facility] 06/19/2016, 12:42 PM

## 2016-07-07 ENCOUNTER — Other Ambulatory Visit: Payer: Self-pay | Admitting: Internal Medicine

## 2016-07-07 DIAGNOSIS — E041 Nontoxic single thyroid nodule: Secondary | ICD-10-CM

## 2016-07-16 ENCOUNTER — Ambulatory Visit (HOSPITAL_COMMUNITY)
Admission: RE | Admit: 2016-07-16 | Discharge: 2016-07-16 | Disposition: A | Discharge status: Home / Self Care | Payer: 59 | Source: Ambulatory Visit | Attending: Internal Medicine | Admitting: Internal Medicine

## 2016-07-16 DIAGNOSIS — E041 Nontoxic single thyroid nodule: Secondary | ICD-10-CM | POA: Diagnosis not present

## 2016-07-31 ENCOUNTER — Other Ambulatory Visit: Payer: Self-pay | Admitting: Internal Medicine

## 2016-07-31 DIAGNOSIS — E041 Nontoxic single thyroid nodule: Secondary | ICD-10-CM

## 2016-08-03 ENCOUNTER — Encounter (HOSPITAL_COMMUNITY): Payer: Self-pay | Admitting: *Deleted

## 2016-08-18 ENCOUNTER — Other Ambulatory Visit (HOSPITAL_COMMUNITY)
Admission: RE | Admit: 2016-08-18 | Discharge: 2016-08-18 | Disposition: A | Discharge status: Home / Self Care | Payer: 59 | Source: Ambulatory Visit | Attending: Student | Admitting: Student

## 2016-08-18 ENCOUNTER — Ambulatory Visit
Admission: RE | Admit: 2016-08-18 | Discharge: 2016-08-18 | Disposition: A | Discharge status: Home / Self Care | Payer: 59 | Source: Ambulatory Visit | Attending: Internal Medicine | Admitting: Internal Medicine

## 2016-08-18 DIAGNOSIS — E0520 Thyrotoxicosis with toxic multinodular goiter without thyrotoxic crisis or storm: Secondary | ICD-10-CM | POA: Insufficient documentation

## 2016-08-18 DIAGNOSIS — E042 Nontoxic multinodular goiter: Secondary | ICD-10-CM | POA: Diagnosis present

## 2016-08-18 DIAGNOSIS — E041 Nontoxic single thyroid nodule: Secondary | ICD-10-CM

## 2016-08-19 ENCOUNTER — Ambulatory Visit (HOSPITAL_COMMUNITY): Payer: 59 | Admitting: Nurse Practitioner

## 2016-09-23 ENCOUNTER — Other Ambulatory Visit (HOSPITAL_COMMUNITY): Payer: Self-pay | Admitting: Nurse Practitioner

## 2016-11-02 ENCOUNTER — Other Ambulatory Visit (HOSPITAL_COMMUNITY): Payer: Self-pay | Admitting: Nurse Practitioner

## 2016-12-10 ENCOUNTER — Other Ambulatory Visit: Payer: Self-pay | Admitting: Gastroenterology

## 2016-12-10 DIAGNOSIS — R112 Nausea with vomiting, unspecified: Secondary | ICD-10-CM

## 2016-12-10 NOTE — Progress Notes (Signed)
Dawn Beeks MD 

## 2016-12-16 ENCOUNTER — Other Ambulatory Visit (HOSPITAL_COMMUNITY): Payer: Self-pay | Admitting: Nurse Practitioner

## 2016-12-17 ENCOUNTER — Encounter (HOSPITAL_COMMUNITY)
Admission: RE | Admit: 2016-12-17 | Discharge: 2016-12-17 | Disposition: A | Discharge status: Home / Self Care | Payer: 59 | Source: Ambulatory Visit | Attending: Gastroenterology | Admitting: Gastroenterology

## 2016-12-17 DIAGNOSIS — R112 Nausea with vomiting, unspecified: Secondary | ICD-10-CM | POA: Insufficient documentation

## 2016-12-22 ENCOUNTER — Inpatient Hospital Stay (HOSPITAL_COMMUNITY)
Admission: EM | Admit: 2016-12-22 | Discharge: 2016-12-29 | DRG: 417 | Disposition: A | Discharge status: Home / Self Care | Payer: 59 | Attending: Surgery | Admitting: Surgery

## 2016-12-22 ENCOUNTER — Encounter (HOSPITAL_COMMUNITY): Payer: Self-pay | Admitting: Emergency Medicine

## 2016-12-22 DIAGNOSIS — I480 Paroxysmal atrial fibrillation: Secondary | ICD-10-CM | POA: Diagnosis present

## 2016-12-22 DIAGNOSIS — Z820 Family history of epilepsy and other diseases of the nervous system: Secondary | ICD-10-CM

## 2016-12-22 DIAGNOSIS — E872 Acidosis: Secondary | ICD-10-CM | POA: Diagnosis present

## 2016-12-22 DIAGNOSIS — Z6837 Body mass index (BMI) 37.0-37.9, adult: Secondary | ICD-10-CM

## 2016-12-22 DIAGNOSIS — Z8249 Family history of ischemic heart disease and other diseases of the circulatory system: Secondary | ICD-10-CM

## 2016-12-22 DIAGNOSIS — N179 Acute kidney failure, unspecified: Secondary | ICD-10-CM | POA: Diagnosis present

## 2016-12-22 DIAGNOSIS — E119 Type 2 diabetes mellitus without complications: Secondary | ICD-10-CM | POA: Diagnosis present

## 2016-12-22 DIAGNOSIS — E1169 Type 2 diabetes mellitus with other specified complication: Secondary | ICD-10-CM

## 2016-12-22 DIAGNOSIS — K8010 Calculus of gallbladder with chronic cholecystitis without obstruction: Secondary | ICD-10-CM | POA: Diagnosis not present

## 2016-12-22 DIAGNOSIS — K8050 Calculus of bile duct without cholangitis or cholecystitis without obstruction: Secondary | ICD-10-CM | POA: Diagnosis present

## 2016-12-22 DIAGNOSIS — Z7901 Long term (current) use of anticoagulants: Secondary | ICD-10-CM

## 2016-12-22 DIAGNOSIS — Z833 Family history of diabetes mellitus: Secondary | ICD-10-CM

## 2016-12-22 DIAGNOSIS — Z841 Family history of disorders of kidney and ureter: Secondary | ICD-10-CM

## 2016-12-22 DIAGNOSIS — R1011 Right upper quadrant pain: Secondary | ICD-10-CM | POA: Diagnosis not present

## 2016-12-22 DIAGNOSIS — R112 Nausea with vomiting, unspecified: Secondary | ICD-10-CM

## 2016-12-22 DIAGNOSIS — I10 Essential (primary) hypertension: Secondary | ICD-10-CM | POA: Diagnosis present

## 2016-12-22 DIAGNOSIS — K660 Peritoneal adhesions (postprocedural) (postinfection): Secondary | ICD-10-CM | POA: Diagnosis present

## 2016-12-22 DIAGNOSIS — E669 Obesity, unspecified: Secondary | ICD-10-CM

## 2016-12-22 DIAGNOSIS — E860 Dehydration: Secondary | ICD-10-CM | POA: Diagnosis present

## 2016-12-22 DIAGNOSIS — K661 Hemoperitoneum: Secondary | ICD-10-CM | POA: Diagnosis not present

## 2016-12-22 DIAGNOSIS — J45909 Unspecified asthma, uncomplicated: Secondary | ICD-10-CM | POA: Diagnosis present

## 2016-12-22 DIAGNOSIS — R0683 Snoring: Secondary | ICD-10-CM | POA: Diagnosis present

## 2016-12-22 DIAGNOSIS — D649 Anemia, unspecified: Secondary | ICD-10-CM | POA: Diagnosis present

## 2016-12-22 LAB — COMPREHENSIVE METABOLIC PANEL
ALT: 20 U/L (ref 14–54)
AST: 29 U/L (ref 15–41)
Albumin: 4.1 g/dL (ref 3.5–5.0)
Alkaline Phosphatase: 106 U/L (ref 38–126)
Anion gap: 11 (ref 5–15)
BUN: 15 mg/dL (ref 6–20)
CO2: 23 mmol/L (ref 22–32)
Calcium: 9.5 mg/dL (ref 8.9–10.3)
Chloride: 106 mmol/L (ref 101–111)
Creatinine, Ser: 0.72 mg/dL (ref 0.44–1.00)
GFR calc Af Amer: >60 mL/min (ref >60)
GFR calc non Af Amer: >60 mL/min (ref >60)
Glucose, Bld: 139 mg/dL — ABNORMAL HIGH (ref 65–99)
Potassium: 3.6 mmol/L (ref 3.5–5.1)
Sodium: 140 mmol/L (ref 135–145)
Total Bilirubin: 0.4 mg/dL (ref 0.3–1.2)
Total Protein: 8.2 g/dL — ABNORMAL HIGH (ref 6.5–8.1)

## 2016-12-22 LAB — CBC
HCT: 39 % (ref 36.0–46.0)
Hemoglobin: 13.3 g/dL (ref 12.0–15.0)
MCH: 28.7 pg (ref 26.0–34.0)
MCHC: 34.1 g/dL (ref 30.0–36.0)
MCV: 84.2 fL (ref 78.0–100.0)
Platelets: 371 10*3/uL (ref 150–400)
RBC: 4.63 MIL/uL (ref 3.87–5.11)
RDW: 13.8 % (ref 11.5–15.5)
WBC: 10.9 10*3/uL — ABNORMAL HIGH (ref 4.0–10.5)

## 2016-12-22 LAB — LIPASE, BLOOD: Lipase: 19 U/L (ref 11–51)

## 2016-12-22 MED ORDER — SODIUM CHLORIDE 0.9 % IV BOLUS (SEPSIS)
1000.0000 mL | Freq: Once | INTRAVENOUS | Status: AC
  Administered 2016-12-23: 1000 mL via INTRAVENOUS

## 2016-12-22 MED ORDER — ONDANSETRON 4 MG PO TBDP
4.0000 mg | ORAL_TABLET | Freq: Once | ORAL | Status: AC | PRN
Start: 2016-12-22 — End: 2016-12-22
  Administered 2016-12-22: 4 mg via ORAL
  Filled 2016-12-22: qty 1

## 2016-12-22 MED ORDER — ONDANSETRON HCL 4 MG/2ML IJ SOLN
4.0000 mg | Freq: Once | INTRAMUSCULAR | Status: AC
  Administered 2016-12-23: 4 mg via INTRAVENOUS
  Filled 2016-12-22: qty 2

## 2016-12-22 MED ORDER — SODIUM CHLORIDE 0.9 % IV SOLN: INTRAVENOUS | Status: DC

## 2016-12-22 MED ORDER — HYDROMORPHONE HCL 1 MG/ML IJ SOLN
1.0000 mg | Freq: Once | INTRAMUSCULAR | Status: AC
  Administered 2016-12-23: 1 mg via INTRAVENOUS
  Filled 2016-12-22: qty 1

## 2016-12-22 NOTE — ED Provider Notes (Signed)
TIME SEEN: 11:37 PM  CHIEF COMPLAINT: abdominal pain  HPI: Pt is a 51 y.o. female with history of paroxysmal atrial fibrillation, diabetes, asthma who presents emergency department with right upper quadrant abdominal pain and intractable vomiting that started at 1 PM. She reports only thing she ate today was a piece of toast this morning. She had recent outpatient ultrasound that showed gallstones and possible chronic cholecystitis on September 13. This was ordered by her GI physician Dr. Loreta Ave. She denies any diarrhea. She thinks she may have had a low-grade fever at home but did not measure it. No chest pain or shortness of breath. She is hypertensive here but does appear very uncomfortable.  ROS: See HPI Constitutional: possible fever  Eyes: no drainage  ENT: no runny nose   Cardiovascular:  no chest pain  Resp: no SOB  GI:  vomiting GU: no dysuria Integumentary: no rash  Allergy: no hives  Musculoskeletal: no leg swelling  Neurological: no slurred speech ROS otherwise negative  PAST MEDICAL HISTORY/PAST SURGICAL HISTORY:  Past Medical History:  Diagnosis Date  . Asthma   . Diabetes mellitus without complication (HCC)    type 2  . Paroxysmal atrial fibrillation (HCC)     MEDICATIONS:  Prior to Admission medications   Medication Sig Start Date End Date Taking? Authorizing Provider  apixaban (ELIQUIS) 5 MG TABS tablet Take 1 tablet (5 mg total) by mouth 2 (two) times daily. 07/11/15  Yes Newman Nip, NP  CARTIA XT 120 MG 24 hr capsule TAKE 1 CAPSULE BY MOUTH ONCE DAILY. NEED APPT FOR FURTHER REFILLS 12/17/16  Yes Newman Nip, NP  flecainide (TAMBOCOR) 100 MG tablet Take 1 tablet (100 mg total) by mouth 2 (two) times daily. 03/03/16  Yes Newman Nip, NP  VICTOZA 18 MG/3ML SOPN Inject 1.8 mg into the skin daily. 08/17/15  Yes [provider]  albuterol (PROVENTIL) (2.5 MG/3ML) 0.083% nebulizer solution Take 3 mLs (2.5 mg total) by nebulization every 6 (six) hours  as needed for wheezing or shortness of breath. Patient not taking: Reported on 12/22/2016 01/01/16   Deborha Payment, PA-C  cephALEXin (KEFLEX) 500 MG capsule Take 2 capsules (1,000 mg total) by mouth 2 (two) times daily. Patient not taking: Reported on 12/22/2016 06/17/16   Fayrene Helper, PA-C  diltiazem (CARDIZEM) 30 MG tablet Cardizem  -- take 1 tablet by mouth every 4 hours AS NEEDED for heart rate >100 as long as blood pressure >100. 07/01/15   Newman Nip, NP  HYDROcodone-acetaminophen (NORCO/VICODIN) 5-325 MG tablet Take 2 tablets by mouth every 6 (six) hours as needed for moderate pain. Patient not taking: Reported on 12/22/2016 06/17/16   Fayrene Helper, PA-C  ondansetron (ZOFRAN) 4 MG tablet Take 1 tablet (4 mg total) by mouth every 6 (six) hours. Patient not taking: Reported on 12/22/2016 06/17/16   Fayrene Helper, PA-C    ALLERGIES:  No Known Allergies  SOCIAL HISTORY:  Social History  Substance Use Topics  . Smoking status: Never Smoker  . Smokeless tobacco: Never Used  . Alcohol use Yes     Comment: occasional    FAMILY HISTORY: Family History  Problem Relation Age of Onset  . Epilepsy Father   . Diabetes Mother   . Kidney disease Mother   . Hypertension Mother   . Kidney disease Brother   . Diabetic kidney disease Brother   . Diabetes Brother     EXAM: BP (!) 182/104   Pulse 66   Temp  98.3 F (36.8 C) (Oral)   Resp 20   SpO2 100%  CONSTITUTIONAL: Alert and oriented and responds appropriately to questions. Patient appears very uncomfortable, afebrile HEAD: Normocephalic EYES: Conjunctivae clear, pupils appear equal, EOMI ENT: normal nose; moist mucous membranes NECK: Supple, no meningismus, no nuchal rigidity, no LAD  CARD: RRR; S1 and S2 appreciated; no murmurs, no clicks, no rubs, no gallops RESP: Normal chest excursion without splinting or tachypnea; breath sounds clear and equal bilaterally; no wheezes, no rhonchi, no rales, no hypoxia or respiratory  distress, speaking full sentences ABD/GI: Normal bowel sounds; non-distended; soft, tender to palpation in the upper abdomen especially in the right upper quadrant with a positive Murphy sign, no rebound, no guarding, no peritoneal signs, no hepatosplenomegaly BACK:  The back appears normal and is non-tender to palpation, there is no CVA tenderness EXT: Normal ROM in all joints; non-tender to palpation; no edema; normal capillary refill; no cyanosis, no calf tenderness or swelling    SKIN: Normal color for age and race; warm; no rash NEURO: Moves all extremities equally PSYCH: The patient's mood and manner are appropriate. Grooming and personal hygiene are appropriate.  MEDICAL DECISION MAKING: patient here with intractable pain and vomiting. Recently diagnosed with cholelithiasis with chronic cholecystitis. She appears very uncomfortable here and is very hypertensive. I do not think this is a dissection. She has no chest pain or shortness of breath. Suspect her hypertension is from pain. We'll give Dilaudid, Zofran. She did take Phenergan at home without relief. Labs show mild leukocytosis but normal LFTs and lipase. We'll repeat ultrasound.    ED PROGRESS: Patient's ultrasound shows cholelithiasis without cholecystitis or biliary dilatation or signs of obstruction. Patient still very symptomatic. We'll give fentanyl, Zofran. We'll discuss with surgery. She agrees that she does not feel comfortable going home.    1:24 AM Discussed patient's case with general surgeon, Dr. Gerrit Friends.  I have recommended admission and patient (and family if present) agree with this plan. Admitting physician will place admission orders.   I reviewed all nursing notes, vitals, pertinent previous records, EKGs, lab and urine results, imaging (as available).       Monroe Qin, Layla Maw, DO 12/23/16 [phone]

## 2016-12-22 NOTE — ED Triage Notes (Signed)
Pt from home with c/o upper right abdominal pain and over 20 episodes of emesis. Pt states she was recently told she has gallstones. Pt is dry heaving at time of assessment.

## 2016-12-23 ENCOUNTER — Encounter (HOSPITAL_COMMUNITY): Admission: EM | Disposition: A | Discharge status: Home / Self Care | Payer: Self-pay | Source: Home / Self Care

## 2016-12-23 ENCOUNTER — Observation Stay (HOSPITAL_COMMUNITY): Payer: 59 | Admitting: Anesthesiology

## 2016-12-23 ENCOUNTER — Encounter (HOSPITAL_COMMUNITY): Payer: Self-pay | Admitting: Surgery

## 2016-12-23 ENCOUNTER — Emergency Department (HOSPITAL_COMMUNITY): Payer: 59

## 2016-12-23 DIAGNOSIS — K8050 Calculus of bile duct without cholangitis or cholecystitis without obstruction: Secondary | ICD-10-CM | POA: Diagnosis present

## 2016-12-23 DIAGNOSIS — E669 Obesity, unspecified: Secondary | ICD-10-CM

## 2016-12-23 DIAGNOSIS — E1169 Type 2 diabetes mellitus with other specified complication: Secondary | ICD-10-CM

## 2016-12-23 DIAGNOSIS — K8010 Calculus of gallbladder with chronic cholecystitis without obstruction: Secondary | ICD-10-CM | POA: Diagnosis present

## 2016-12-23 DIAGNOSIS — E119 Type 2 diabetes mellitus without complications: Secondary | ICD-10-CM

## 2016-12-23 HISTORY — PX: CHOLECYSTECTOMY: SHX55

## 2016-12-23 LAB — URINALYSIS, ROUTINE W REFLEX MICROSCOPIC
Bilirubin Urine: NEGATIVE
Glucose, UA: NEGATIVE mg/dL
Ketones, ur: 5 mg/dL — AB
Leukocytes, UA: NEGATIVE
Nitrite: NEGATIVE
Protein, ur: >=300 mg/dL — AB
Specific Gravity, Urine: 1.017 (ref 1.005–1.030)
pH: 8 (ref 5.0–8.0)

## 2016-12-23 LAB — GLUCOSE, CAPILLARY
Glucose-Capillary: 192 mg/dL — ABNORMAL HIGH (ref 65–99)
Glucose-Capillary: 157 mg/dL — ABNORMAL HIGH (ref 65–99)
Glucose-Capillary: 196 mg/dL — ABNORMAL HIGH (ref 65–99)
Glucose-Capillary: 157 mg/dL — ABNORMAL HIGH (ref 65–99)
Glucose-Capillary: 163 mg/dL — ABNORMAL HIGH (ref 65–99)
Glucose-Capillary: 204 mg/dL — ABNORMAL HIGH (ref 65–99)
Glucose-Capillary: 291 mg/dL — ABNORMAL HIGH (ref 65–99)

## 2016-12-23 LAB — SURGICAL PCR SCREEN
MRSA, PCR: NEGATIVE
Staphylococcus aureus: NEGATIVE

## 2016-12-23 LAB — CBG MONITORING, ED: Glucose-Capillary: 147 mg/dL — ABNORMAL HIGH (ref 65–99)

## 2016-12-23 LAB — HIV ANTIBODY (ROUTINE TESTING W REFLEX): HIV Screen 4th Generation wRfx: NONREACTIVE

## 2016-12-23 SURGERY — LAPAROSCOPIC CHOLECYSTECTOMY
Anesthesia: General | Site: Abdomen

## 2016-12-23 MED ORDER — LACTATED RINGERS IV SOLN
INTRAVENOUS | Status: DC | PRN
  Administered 2016-12-23 (×2): via INTRAVENOUS

## 2016-12-23 MED ORDER — HYDRALAZINE HCL 20 MG/ML IJ SOLN
5.0000 mg | Freq: Once | INTRAMUSCULAR | Status: AC
  Administered 2016-12-23: 5 mg via INTRAVENOUS

## 2016-12-23 MED ORDER — HYDRALAZINE HCL 20 MG/ML IJ SOLN
INTRAMUSCULAR | Status: AC
  Filled 2016-12-23: qty 1

## 2016-12-23 MED ORDER — LABETALOL HCL 5 MG/ML IV SOLN
INTRAVENOUS | Status: DC | PRN
Start: 2016-12-23 — End: 2016-12-23
  Administered 2016-12-23 (×2): 5 mg via INTRAVENOUS

## 2016-12-23 MED ORDER — LABETALOL HCL 5 MG/ML IV SOLN
5.0000 mg | Freq: Once | INTRAVENOUS | Status: AC
  Administered 2016-12-23: 10 mg via INTRAVENOUS

## 2016-12-23 MED ORDER — PROCHLORPERAZINE EDISYLATE 5 MG/ML IJ SOLN
10.0000 mg | Freq: Four times a day (QID) | INTRAMUSCULAR | Status: DC | PRN
  Administered 2016-12-23 – 2016-12-27 (×5): 10 mg via INTRAVENOUS
  Filled 2016-12-23 (×5): qty 2

## 2016-12-23 MED ORDER — ONDANSETRON 4 MG PO TBDP: 4.0000 mg | ORAL_TABLET | Freq: Four times a day (QID) | ORAL | Status: DC | PRN

## 2016-12-23 MED ORDER — INSULIN ASPART 100 UNIT/ML ~~LOC~~ SOLN
4.0000 [IU] | Freq: Once | SUBCUTANEOUS | Status: AC
  Administered 2016-12-23: 4 [IU] via SUBCUTANEOUS

## 2016-12-23 MED ORDER — MIDAZOLAM HCL 5 MG/5ML IJ SOLN
INTRAMUSCULAR | Status: DC | PRN
Start: 2016-12-23 — End: 2016-12-23
  Administered 2016-12-23: 2 mg via INTRAVENOUS

## 2016-12-23 MED ORDER — ONDANSETRON HCL 4 MG/2ML IJ SOLN
INTRAMUSCULAR | Status: AC
  Filled 2016-12-23: qty 2

## 2016-12-23 MED ORDER — DEXAMETHASONE SODIUM PHOSPHATE 10 MG/ML IJ SOLN
INTRAMUSCULAR | Status: AC
  Filled 2016-12-23: qty 1

## 2016-12-23 MED ORDER — PROPOFOL 10 MG/ML IV BOLUS
INTRAVENOUS | Status: AC
  Filled 2016-12-23: qty 20

## 2016-12-23 MED ORDER — ONDANSETRON HCL 4 MG/2ML IJ SOLN
4.0000 mg | Freq: Four times a day (QID) | INTRAMUSCULAR | Status: DC | PRN
  Administered 2016-12-23 – 2016-12-25 (×4): 4 mg via INTRAVENOUS
  Filled 2016-12-23 (×4): qty 2

## 2016-12-23 MED ORDER — METOCLOPRAMIDE HCL 5 MG/ML IJ SOLN: 10.0000 mg | Freq: Once | INTRAMUSCULAR | Status: DC | PRN

## 2016-12-23 MED ORDER — SUGAMMADEX SODIUM 200 MG/2ML IV SOLN
INTRAVENOUS | Status: DC | PRN
  Administered 2016-12-23: 200 mg via INTRAVENOUS

## 2016-12-23 MED ORDER — SCOPOLAMINE 1 MG/3DAYS TD PT72
1.0000 | MEDICATED_PATCH | Freq: Once | TRANSDERMAL | Status: DC
  Administered 2016-12-23: 1.5 mg via TRANSDERMAL

## 2016-12-23 MED ORDER — LIDOCAINE 2% (20 MG/ML) 5 ML SYRINGE
INTRAMUSCULAR | Status: AC
  Filled 2016-12-23: qty 5

## 2016-12-23 MED ORDER — ONDANSETRON HCL 4 MG/2ML IJ SOLN
4.0000 mg | Freq: Once | INTRAMUSCULAR | Status: AC
  Administered 2016-12-23: 4 mg via INTRAVENOUS
  Filled 2016-12-23: qty 2

## 2016-12-23 MED ORDER — ACETAMINOPHEN 650 MG RE SUPP: 650.0000 mg | Freq: Four times a day (QID) | RECTAL | Status: DC | PRN

## 2016-12-23 MED ORDER — HYDROMORPHONE HCL-NACL 0.5-0.9 MG/ML-% IV SOSY: 0.2500 mg | PREFILLED_SYRINGE | INTRAVENOUS | Status: DC | PRN

## 2016-12-23 MED ORDER — PROMETHAZINE HCL 25 MG/ML IJ SOLN
12.5000 mg | Freq: Four times a day (QID) | INTRAMUSCULAR | Status: DC | PRN
  Administered 2016-12-23 – 2016-12-25 (×2): 12.5 mg via INTRAVENOUS
  Filled 2016-12-23 (×2): qty 1

## 2016-12-23 MED ORDER — BUPIVACAINE-EPINEPHRINE (PF) 0.25% -1:200000 IJ SOLN
INTRAMUSCULAR | Status: AC
  Filled 2016-12-23: qty 30

## 2016-12-23 MED ORDER — HYDROMORPHONE HCL-NACL 0.5-0.9 MG/ML-% IV SOSY
1.0000 mg | PREFILLED_SYRINGE | INTRAVENOUS | Status: DC | PRN
  Administered 2016-12-24 (×2): 1 mg via INTRAVENOUS
  Filled 2016-12-23 (×2): qty 2

## 2016-12-23 MED ORDER — FENTANYL CITRATE (PF) 250 MCG/5ML IJ SOLN
INTRAMUSCULAR | Status: AC
  Filled 2016-12-23: qty 5

## 2016-12-23 MED ORDER — SUCCINYLCHOLINE CHLORIDE 20 MG/ML IJ SOLN
INTRAMUSCULAR | Status: DC | PRN
  Administered 2016-12-23: 120 mg via INTRAVENOUS

## 2016-12-23 MED ORDER — ROCURONIUM BROMIDE 100 MG/10ML IV SOLN
INTRAVENOUS | Status: DC | PRN
  Administered 2016-12-23: 30 mg via INTRAVENOUS

## 2016-12-23 MED ORDER — LACTATED RINGERS IV SOLN
INTRAVENOUS | Status: DC
  Administered 2016-12-23: 16:00:00 via INTRAVENOUS

## 2016-12-23 MED ORDER — PROPOFOL 10 MG/ML IV BOLUS
INTRAVENOUS | Status: DC | PRN
Start: 2016-12-23 — End: 2016-12-23
  Administered 2016-12-23: 180 mg via INTRAVENOUS

## 2016-12-23 MED ORDER — ACETAMINOPHEN 325 MG PO TABS: 650.0000 mg | ORAL_TABLET | Freq: Four times a day (QID) | ORAL | Status: DC | PRN

## 2016-12-23 MED ORDER — MIDAZOLAM HCL 2 MG/2ML IJ SOLN
INTRAMUSCULAR | Status: AC
  Filled 2016-12-23: qty 2

## 2016-12-23 MED ORDER — LACTATED RINGERS IR SOLN
Status: DC | PRN
  Administered 2016-12-23: 1000 mL

## 2016-12-23 MED ORDER — FENTANYL CITRATE (PF) 100 MCG/2ML IJ SOLN
50.0000 ug | Freq: Once | INTRAMUSCULAR | Status: AC
  Administered 2016-12-23: 50 ug via INTRAVENOUS
  Filled 2016-12-23: qty 2

## 2016-12-23 MED ORDER — MEPERIDINE HCL 50 MG/ML IJ SOLN
6.2500 mg | INTRAMUSCULAR | Status: DC | PRN
Start: 2016-12-23 — End: 2016-12-23

## 2016-12-23 MED ORDER — KCL IN DEXTROSE-NACL 20-5-0.45 MEQ/L-%-% IV SOLN
INTRAVENOUS | Status: DC
  Administered 2016-12-23 – 2016-12-24 (×3): via INTRAVENOUS
  Filled 2016-12-23 (×5): qty 1000

## 2016-12-23 MED ORDER — LIDOCAINE HCL (CARDIAC) 20 MG/ML IV SOLN
INTRAVENOUS | Status: DC | PRN
  Administered 2016-12-23: 80 mg via INTRAVENOUS

## 2016-12-23 MED ORDER — DEXTROSE 5 % IV SOLN
2.0000 g | INTRAVENOUS | Status: DC
  Administered 2016-12-23 – 2016-12-24 (×2): 2 g via INTRAVENOUS
  Filled 2016-12-23 (×2): qty 2

## 2016-12-23 MED ORDER — INSULIN ASPART 100 UNIT/ML ~~LOC~~ SOLN
0.0000 [IU] | SUBCUTANEOUS | Status: DC
  Administered 2016-12-23: 5 [IU] via SUBCUTANEOUS
  Administered 2016-12-23 (×2): 3 [IU] via SUBCUTANEOUS
  Administered 2016-12-24: 8 [IU] via SUBCUTANEOUS
  Administered 2016-12-24: 3 [IU] via SUBCUTANEOUS
  Administered 2016-12-24: 11 [IU] via SUBCUTANEOUS
  Administered 2016-12-24: 5 [IU] via SUBCUTANEOUS
  Administered 2016-12-24 (×2): 8 [IU] via SUBCUTANEOUS
  Administered 2016-12-25 (×3): 3 [IU] via SUBCUTANEOUS
  Administered 2016-12-25: 2 [IU] via SUBCUTANEOUS
  Administered 2016-12-25: 3 [IU] via SUBCUTANEOUS
  Administered 2016-12-25 – 2016-12-26 (×7): 2 [IU] via SUBCUTANEOUS
  Administered 2016-12-27: 3 [IU] via SUBCUTANEOUS
  Administered 2016-12-27 (×5): 2 [IU] via SUBCUTANEOUS
  Administered 2016-12-28: 3 [IU] via SUBCUTANEOUS
  Administered 2016-12-28: 2 [IU] via SUBCUTANEOUS

## 2016-12-23 MED ORDER — HYDROCODONE-ACETAMINOPHEN 5-325 MG PO TABS: 1.0000 | ORAL_TABLET | ORAL | Status: DC | PRN

## 2016-12-23 MED ORDER — BUPIVACAINE-EPINEPHRINE 0.25% -1:200000 IJ SOLN
INTRAMUSCULAR | Status: DC | PRN
  Administered 2016-12-23: 30 mL

## 2016-12-23 MED ORDER — SCOPOLAMINE 1 MG/3DAYS TD PT72
MEDICATED_PATCH | TRANSDERMAL | Status: AC
  Administered 2016-12-23: 1.5 mg via TRANSDERMAL
  Filled 2016-12-23: qty 1

## 2016-12-23 MED ORDER — HYDROMORPHONE HCL 1 MG/ML IJ SOLN: 1.0000 mg | INTRAMUSCULAR | Status: DC | PRN

## 2016-12-23 MED ORDER — DEXAMETHASONE SODIUM PHOSPHATE 10 MG/ML IJ SOLN
INTRAMUSCULAR | Status: DC | PRN
  Administered 2016-12-23: 10 mg via INTRAVENOUS

## 2016-12-23 MED ORDER — PHENYLEPHRINE HCL 10 MG/ML IJ SOLN
INTRAMUSCULAR | Status: DC | PRN
  Administered 2016-12-23 (×2): 80 ug via INTRAVENOUS

## 2016-12-23 MED ORDER — INSULIN ASPART 100 UNIT/ML ~~LOC~~ SOLN
SUBCUTANEOUS | Status: AC
  Administered 2016-12-23: 4 [IU] via SUBCUTANEOUS
  Filled 2016-12-23: qty 1

## 2016-12-23 MED ORDER — FENTANYL CITRATE (PF) 100 MCG/2ML IJ SOLN
INTRAMUSCULAR | Status: DC | PRN
Start: 2016-12-23 — End: 2016-12-23
  Administered 2016-12-23: 150 ug via INTRAVENOUS
  Administered 2016-12-23: 50 ug via INTRAVENOUS

## 2016-12-23 MED ORDER — SUGAMMADEX SODIUM 200 MG/2ML IV SOLN
INTRAVENOUS | Status: AC
  Filled 2016-12-23: qty 2

## 2016-12-23 SURGICAL SUPPLY — 43 items
APL SKNCLS STERI-STRIP NONHPOA (GAUZE/BANDAGES/DRESSINGS)
APPLIER CLIP ROT 10 11.4 M/L (STAPLE)
APR CLP MED LRG 11.4X10 (STAPLE)
BAG SPEC RTRVL 10 TROC 200 (ENDOMECHANICALS) ×1
BANDAGE ADH SHEER 1  50/CT (GAUZE/BANDAGES/DRESSINGS) ×5 IMPLANT
BENZOIN TINCTURE PRP APPL 2/3 (GAUZE/BANDAGES/DRESSINGS) ×1 IMPLANT
CABLE HIGH FREQUENCY MONO STRZ (ELECTRODE) ×2 IMPLANT
CATH CHOLANG 76X19 KUMAR (CATHETERS) IMPLANT
CHLORAPREP W/TINT 26ML (MISCELLANEOUS) ×2 IMPLANT
CLIP APPLIE ROT 10 11.4 M/L (STAPLE) IMPLANT
CLIP VESOLOCK LG 6/CT PURPLE (CLIP) IMPLANT
CLIP VESOLOCK XL 6/CT (CLIP) IMPLANT
COVER MAYO STAND STRL (DRAPES) IMPLANT
COVER SURGICAL LIGHT HANDLE (MISCELLANEOUS) ×2 IMPLANT
DECANTER SPIKE VIAL GLASS SM (MISCELLANEOUS) ×2 IMPLANT
DRAIN CHANNEL 19F RND (DRAIN) IMPLANT
DRAPE C-ARM 42X120 X-RAY (DRAPES) IMPLANT
EVACUATOR SILICONE 100CC (DRAIN) IMPLANT
GLOVE BIOGEL PI IND STRL 7.0 (GLOVE) ×1 IMPLANT
GLOVE BIOGEL PI INDICATOR 7.0 (GLOVE) ×1
GLOVE SURG SS PI 7.0 STRL IVOR (GLOVE) ×2 IMPLANT
GOWN STRL REUS W/TWL LRG LVL3 (GOWN DISPOSABLE) ×2 IMPLANT
GOWN STRL REUS W/TWL XL LVL3 (GOWN DISPOSABLE) ×4 IMPLANT
GRASPER SUT TROCAR 14GX15 (MISCELLANEOUS) ×2 IMPLANT
IRRIG SUCT STRYKERFLOW 2 WTIP (MISCELLANEOUS) ×2
IRRIGATION SUCT STRKRFLW 2 WTP (MISCELLANEOUS) ×1 IMPLANT
KIT BASIN OR (CUSTOM PROCEDURE TRAY) ×2 IMPLANT
POUCH RETRIEVAL ECOSAC 10 (ENDOMECHANICALS) ×1 IMPLANT
POUCH RETRIEVAL ECOSAC 10MM (ENDOMECHANICALS) ×1
SCISSORS LAP 5X35 DISP (ENDOMECHANICALS) ×2 IMPLANT
SHEARS HARMONIC ACE PLUS 36CM (ENDOMECHANICALS) IMPLANT
SLEEVE XCEL OPT CAN 5 100 (ENDOMECHANICALS) ×4 IMPLANT
STOPCOCK 4 WAY LG BORE MALE ST (IV SETS) ×1 IMPLANT
STRIP CLOSURE SKIN 1/2X4 (GAUZE/BANDAGES/DRESSINGS) ×2 IMPLANT
SUT ETHILON 2 0 PS N (SUTURE) IMPLANT
SUT MNCRL AB 4-0 PS2 18 (SUTURE) ×2 IMPLANT
SUT VICRYL 0 ENDOLOOP (SUTURE) IMPLANT
TOWEL OR 17X26 10 PK STRL BLUE (TOWEL DISPOSABLE) ×2 IMPLANT
TOWEL OR NON WOVEN STRL DISP B (DISPOSABLE) ×2 IMPLANT
TRAY LAPAROSCOPIC (CUSTOM PROCEDURE TRAY) ×2 IMPLANT
TROCAR BLADELESS OPT 5 100 (ENDOMECHANICALS) ×2 IMPLANT
TROCAR XCEL 12X100 BLDLESS (ENDOMECHANICALS) ×2 IMPLANT
TUBING INSUF HEATED (TUBING) ×2 IMPLANT

## 2016-12-23 NOTE — Progress Notes (Signed)
  Progress Note: General Surgery Service   Assessment/Plan: Patient Active Problem List   Diagnosis Date Noted  . Cholelithiasis with chronic cholecystitis 12/23/2016  . Biliary colic 12/23/2016  . Diabetes mellitus (HCC) 12/23/2016   Acute cholecystitis -add additional antiemetic -We discussed the etiology of her pain, we discussed treatment options and recommended surgery. We discussed details of surgery including general anesthesia, laparoscopic approach, identification of cystic duct and common bile duct. Ligation of cystic duct and cystic artery. Possible need for intraoperative cholangiogram or open procedure. Possible risks of common bile duct injury, liver injury, cystic duct leak, bleeding, infection, post-cholecystectomy syndrome. The patient showed good understanding and all questions were answered    LOS: 0 days  Chief Complaint/Subjective: Continued nausea and vomiting, minimal pain  Objective: Vital signs in last 24 hours: Temp:  [97.7 F (36.5 C)-98.3 F (36.8 C)] 97.7 F (36.5 C) (09/19 0500) Pulse Rate:  [66-89] 75 (09/19 0500) Resp:  [15-20] 18 (09/19 0500) BP: (139-209)/(69-104) 152/88 (09/19 0400) SpO2:  [96 %-100 %] 100 % (09/19 0500) Last BM Date: 12/22/16  Intake/Output from previous day: 09/18 0701 - 09/19 0700 In: 850 [IV Piggyback:850] Out: 100 [Emesis/NG output:100] Intake/Output this shift: No intake/output data recorded.  Lungs: CTAb  Cardiovascular: RRR  Abd: soft, TTP epigastrium, neg murphy's  Extremities: no edema  Neuro: AOx4  Lab Results: CBC   Recent Labs  12/22/16 2044  WBC 10.9*  HGB 13.3  HCT 39.0  PLT 371   BMET  Recent Labs  12/22/16 2044  NA 140  K 3.6  CL 106  CO2 23  GLUCOSE 139*  BUN 15  CREATININE 0.72  CALCIUM 9.5   PT/INR No results for input(s): LABPROT, INR in the last 72 hours. ABG No results for input(s): PHART, HCO3 in the last 72 hours.  Invalid input(s): PCO2,  PO2  Studies/Results:  Anti-infectives: Anti-infectives    Start     Dose/Rate Route Frequency Ordered Stop   12/23/16 0200  cefTRIAXone (ROCEPHIN) 2 g in dextrose 5 % 50 mL IVPB     2 g 100 mL/hr over 30 Minutes Intravenous Every 24 hours 12/23/16 0148        Medications: Scheduled Meds: . insulin aspart  0-15 Units Subcutaneous Q4H   Continuous Infusions: . cefTRIAXone (ROCEPHIN)  IV Stopped (12/23/16 0238)  . dextrose 5 % and 0.45 % NaCl with KCl 20 mEq/L 150 mL/hr at 12/23/16 0207   PRN Meds:.acetaminophen **OR** acetaminophen, HYDROcodone-acetaminophen, HYDROmorphone (DILAUDID) injection, ondansetron **OR** ondansetron (ZOFRAN) IV, prochlorperazine, promethazine  Rodman Pickle, MD Pg# [phone] [facility]

## 2016-12-23 NOTE — Anesthesia Postprocedure Evaluation (Signed)
Anesthesia Post Note  Patient: Dawn Braun  Procedure(s) Performed: Procedure(s) (LRB): LAPAROSCOPIC CHOLECYSTECTOMY (N/A)     Patient location during evaluation: PACU Anesthesia Type: General Level of consciousness: awake and alert Pain management: pain level controlled Vital Signs Assessment: post-procedure vital signs reviewed and stable Respiratory status: spontaneous breathing, nonlabored ventilation, respiratory function stable and patient connected to nasal cannula oxygen Cardiovascular status: blood pressure returned to baseline and stable Postop Assessment: no apparent nausea or vomiting Anesthetic complications: no    Last Vitals:  Vitals:   12/23/16 1800 12/23/16 1810  BP: (!) 188/100 (!) 170/96  Pulse: 84 88  Resp: 16 17  Temp:    SpO2: 100% 100%    Last Pain:  Vitals:   12/23/16 1745  TempSrc:   PainSc: Asleep                 Dallis Czaja,W. EDMOND

## 2016-12-23 NOTE — Anesthesia Procedure Notes (Signed)
Procedure Name: Intubation Date/Time: 12/23/2016 4:10 PM Performed by: West Pugh Pre-anesthesia Checklist: Patient identified, Emergency Drugs available, Suction available, Patient being monitored and Timeout performed Patient Re-evaluated:Patient Re-evaluated prior to induction Oxygen Delivery Method: Circle system utilized Preoxygenation: Pre-oxygenation with 100% oxygen Induction Type: IV induction, Rapid sequence and Cricoid Pressure applied Laryngoscope Size: Charlyne Petrin and 2 Grade View: Grade II Tube type: Oral Tube size: 7.5 mm Airway Equipment and Method: Stylet Placement Confirmation: ETT inserted through vocal cords under direct vision,  positive ETCO2,  CO2 detector and breath sounds checked- equal and bilateral Tube secured with: Tape Dental Injury: Teeth and Oropharynx as per pre-operative assessment

## 2016-12-23 NOTE — Anesthesia Preprocedure Evaluation (Addendum)
Anesthesia Evaluation  Patient identified by MRN, date of birth, ID band Patient awake    Reviewed: Allergy & Precautions, NPO status , Patient's Chart, lab work & pertinent test results  Airway Mallampati: III  TM Distance: >3 FB Neck ROM: Full    Dental no notable dental hx. (+) Teeth Intact   Pulmonary asthma ,    Pulmonary exam normal breath sounds clear to auscultation       Cardiovascular negative cardio ROS Normal cardiovascular exam Rhythm:Regular Rate:Normal     Neuro/Psych negative neurological ROS  negative psych ROS   GI/Hepatic Neg liver ROS, Cholelithiasis with acute cholecystitis   Endo/Other  diabetes, Poorly Controlled, Type 2, Oral Hypoglycemic AgentsObesity Hyperlipidemia  Renal/GU negative Renal ROS  negative genitourinary   Musculoskeletal negative musculoskeletal ROS (+)   Abdominal (+) + obese,   Peds  Hematology   Anesthesia Other Findings   Reproductive/Obstetrics                             Anesthesia Physical Anesthesia Plan  ASA: III  Anesthesia Plan: General   Post-op Pain Management:    Induction: Intravenous, Cricoid pressure planned and Rapid sequence  PONV Risk Score and Plan: 4 or greater and Ondansetron, Dexamethasone, Midazolam, Scopolamine patch - Pre-op, Propofol infusion and Metaclopromide  Airway Management Planned: Oral ETT  Additional Equipment:   Intra-op Plan:   Post-operative Plan: Extubation in OR  Informed Consent: I have reviewed the patients History and Physical, chart, labs and discussed the procedure including the risks, benefits and alternatives for the proposed anesthesia with the patient or authorized representative who has indicated his/her understanding and acceptance.   Dental advisory given  Plan Discussed with: CRNA, Anesthesiologist and Surgeon  Anesthesia Plan Comments:         Anesthesia Quick  Evaluation

## 2016-12-23 NOTE — Transfer of Care (Signed)
Immediate Anesthesia Transfer of Care Note  Patient: Dawn Braun  Procedure(s) Performed: Procedure(s): LAPAROSCOPIC CHOLECYSTECTOMY (N/A)  Patient Location: PACU  Anesthesia Type:General  Level of Consciousness:  sedated, patient cooperative and responds to stimulation  Airway & Oxygen Therapy:Patient Spontanous Breathing and Patient connected to face mask oxgen  Post-op Assessment:  Report given to PACU RN and Post -op Vital signs reviewed and stable  Post vital signs:  Reviewed and stable  Last Vitals:  Vitals:   12/23/16 0500 12/23/16 1715  BP:  (!) 188/95  Pulse: 75 89  Resp: 18 19  Temp: 36.5 C 36.6 C  SpO2: 100% 100%    Complications: No apparent anesthesia complications

## 2016-12-23 NOTE — H&P (Signed)
Dawn Braun is an 51 y.o. female.    General Surgery [facility]  Chief Complaint: abdominal pain, unrelenting biliary colic, nausea, emesis  HPI: patient is a 51 yo BF with DM-2, who presents to ER with unrelenting RUQ abdominal pain, nausea, and emesis.  Patient has known gallstones and has had episodes of transient biliary colic.  Patient has seen GI (Dr. Collene Mares) and been referred to surgery for evaluation.  On 9/18 patient developed epigastric abd pain which has been unrelenting.  Onset of nausea and emesis.  Presented to ER for evaluation.  WBC 10.9.  LFT's normal.  USN with cholelithiasis without acute changes.  General surgery now called for evaluation and management.  Previous abd surgery includes C-section and BTL.  Med hx notable for DM-2 managed by Dr. Jeanann Lewandowsky.  Past Medical History:  Diagnosis Date  . Asthma   . Diabetes mellitus without complication ([place])    type 2  . Paroxysmal atrial fibrillation [facility])     Past Surgical History:  Procedure Laterality Date  . EYE SURGERY Right   . KNEE ARTHROSCOPY Right   . SPLIT NIGHT STUDY  08/06/2015  . TUBAL LIGATION      Family History  Problem Relation Age of Onset  . Epilepsy Father   . Diabetes Mother   . Kidney disease Mother   . Hypertension Mother   . Kidney disease Brother   . Diabetic kidney disease Brother   . Diabetes Brother    Social History:  reports that she has never smoked. She has never used smokeless tobacco. She reports that she drinks alcohol. She reports that she does not use drugs.  Allergies: No Known Allergies   (Not in a hospital admission)  Results for orders placed or performed during the hospital encounter of 12/22/16 (from the past 48 hour(s))  Lipase, blood     Status: None   Collection Time: 12/22/16  8:44 PM  Result Value Ref Range   Lipase 19 11 - 51 U/L  Comprehensive metabolic panel     Status: Abnormal   Collection Time: 12/22/16  8:44 PM  Result  Value Ref Range   Sodium 140 135 - 145 mmol/L   Potassium 3.6 3.5 - 5.1 mmol/L   Chloride 106 101 - 111 mmol/L   CO2 23 22 - 32 mmol/L   Glucose, Bld 139 (H) 65 - 99 mg/dL   BUN 15 6 - 20 mg/dL   Creatinine, Ser 0.72 0.44 - 1.00 mg/dL   Calcium 9.5 8.9 - 10.3 mg/dL   Total Protein 8.2 (H) 6.5 - 8.1 g/dL   Albumin 4.1 3.5 - 5.0 g/dL   AST 29 15 - 41 U/L   ALT 20 14 - 54 U/L   Alkaline Phosphatase 106 38 - 126 U/L   Total Bilirubin 0.4 0.3 - 1.2 mg/dL   GFR calc non Af Amer >60 >60 mL/min   GFR calc Af Amer >60 >60 mL/min    Comment: (NOTE) The eGFR has been calculated using the CKD EPI equation. This calculation has not been validated in all clinical situations. eGFR's persistently <60 mL/min signify possible Chronic Kidney Disease.    Anion gap 11 5 - 15  CBC     Status: Abnormal   Collection Time: 12/22/16  8:44 PM  Result Value Ref Range   WBC 10.9 (H) 4.0 - 10.5 K/uL   RBC 4.63 3.87 - 5.11 MIL/uL   Hemoglobin 13.3 12.0 - 15.0 g/dL  HCT 39.0 36.0 - 46.0 %   MCV 84.2 78.0 - 100.0 fL   MCH 28.7 26.0 - 34.0 pg   MCHC 34.1 30.0 - 36.0 g/dL   RDW 13.8 11.5 - 15.5 %   Platelets 371 150 - 400 K/uL  Urinalysis, Routine w reflex microscopic     Status: Abnormal   Collection Time: 12/23/16 12:29 AM  Result Value Ref Range   Color, Urine YELLOW YELLOW   APPearance HAZY (A) CLEAR   Specific Gravity, Urine 1.017 1.005 - 1.030   pH 8.0 5.0 - 8.0   Glucose, UA NEGATIVE NEGATIVE mg/dL   Hgb urine dipstick SMALL (A) NEGATIVE   Bilirubin Urine NEGATIVE NEGATIVE   Ketones, ur 5 (A) NEGATIVE mg/dL   Protein, ur >=300 (A) NEGATIVE mg/dL   Nitrite NEGATIVE NEGATIVE   Leukocytes, UA NEGATIVE NEGATIVE   RBC / HPF 6-30 0 - 5 RBC/hpf   WBC, UA 0-5 0 - 5 WBC/hpf   Bacteria, UA RARE (A) NONE SEEN   Squamous Epithelial / LPF 0-5 (A) NONE SEEN   Mucus PRESENT    US Abdomen Limited Ruq  Result Date: 12/23/2016 CLINICAL DATA:  Nausea and vomiting for 3 weeks EXAM: ULTRASOUND ABDOMEN  LIMITED RIGHT UPPER QUADRANT COMPARISON:  Ultrasound 12/17/2016 FINDINGS: Gallbladder: Shadowing stones, measuring up to 1.8 cm. Negative sonographic Murphy. Normal wall thickness. Common bile duct: Diameter: 4.4 mm Liver: No focal lesion identified. Within normal limits in parenchymal echogenicity. Portal vein is patent on color Doppler imaging with normal direction of blood flow towards the liver. IMPRESSION: Cholelithiasis without sonographic evidence for acute cholecystitis or biliary dilatation. Electronically Signed   By: Donavan Foil M.D.   On: 12/23/2016 00:31    Review of Systems  Constitutional: Positive for diaphoresis.  HENT: Negative.   Eyes: Negative.   Respiratory: Negative.   Cardiovascular: Negative.   Gastrointestinal: Positive for abdominal pain, nausea and vomiting.  Genitourinary: Negative.   Musculoskeletal: Negative.   Skin: Negative.   Neurological: Positive for weakness.  Endo/Heme/Allergies: Negative.   Psychiatric/Behavioral: Negative.     Blood pressure (!) 144/69, pulse 89, temperature 98.3 F (36.8 C), temperature source Oral, resp. rate 18, SpO2 100 %. Physical Exam  Constitutional: She is oriented to person, place, and time. She appears well-developed and well-nourished. She appears distressed.  HENT:  Head: Normocephalic and atraumatic.  Right Ear: External ear normal.  Left Ear: External ear normal.  Eyes: Pupils are equal, round, and reactive to light. Conjunctivae are normal. No scleral icterus.  Neck: Normal range of motion. Neck supple. No tracheal deviation present. No thyromegaly present.  Cardiovascular: Normal rate, regular rhythm and normal heart sounds.   No murmur heard. Respiratory: Effort normal and breath sounds normal. No respiratory distress. She has no wheezes.  GI: Soft. Bowel sounds are normal. She exhibits no distension and no mass. There is tenderness (RUQ). There is guarding (voluntary). There is no rebound.  Musculoskeletal:  Normal range of motion. She exhibits no edema or deformity.  Neurological: She is alert and oriented to person, place, and time.  Skin: Skin is warm and dry. She is not diaphoretic.  Psychiatric: She has a normal mood and affect. Her behavior is normal.     Assessment/Plan Chronic cholecystitis, cholelithiasis, unrelenting biliary colic with nausea and emesis Type II DM Obesity  Admit to general surgery service for cholecystectomy  IV hydration  Empiric abx IV  Rx pain, nausea  SSI for DM control  Anticipate OR later today for  cholecystectomy - discussed with patient and family  The risks and benefits of the procedure have been discussed at length with the patient.  The patient understands the proposed procedure, potential alternative treatments, and the course of recovery to be expected.  All of the patient's questions have been answered at this time.  The patient wishes to proceed with surgery.  Earnstine Regal, MD, [facility] Office: [place], MD 12/23/2016, 1:37 AM

## 2016-12-23 NOTE — Op Note (Signed)
PATIENT:  Dawn Braun  51 y.o. female  PRE-OPERATIVE DIAGNOSIS:  cholelithiasis  POST-OPERATIVE DIAGNOSIS:  cholelithiasis  PROCEDURE:  Procedure(s): LAPAROSCOPIC CHOLECYSTECTOMY   SURGEON:  Surgeon(s): Keyuana Wank, De Blanch, MD  ASSISTANT: none  ANESTHESIA:   local and general  Indications for procedure: Dawn Braun is a 51 y.o. female with symptoms of Abdominal pain and Nausea and vomiting consistent with gallbladder disease, Confirmed by Ultrasound.  Description of procedure: The patient was brought into the operative suite, placed supine. Anesthesia was administered with endotracheal tube. Patient was strapped in place and foot board was secured. All pressure points were offloaded by foam padding. The patient was prepped and draped in the usual sterile fashion.  A small incision was made to the right of the umbilicus. A 5mm trocar was inserted into the peritoneal cavity with optical entry. Pneumoperitoneum was applied with high flow low pressure. 2 5mm trocars were placed in the RUQ. A 12mm trocar was placed in the subxiphoid space. All trocars sites were first anesthesized with 0.25% marcaine with epinephrine in the subcutaneous and preperitoneal layers. Next the patient was placed in reverse trendelenberg. Multiple adhesions of the liver to the diaphragm were present and were taken down with cautery. The gallbladder was dilated with some chronic inflammatory changes.  The gallbladder was retracted cephalad and lateral. The peritoneum was reflected off the infundibulum working lateral to medial. The cystic duct and cystic artery were identified and further dissection revealed a critical view.  The cystic duct and cystic artery were doubly clipped and ligated.   The gallbladder was removed off the liver bed with cautery. The Gallbladder was placed in a specimen bag. The gallbladder fossa was irrigated and hemostasis was applied with cautery. The gallbladder was removed  via the 12mm trocar. The fascial defect was closed with interrupted 0 vicryl suture via laparoscopic trans-fascial suture passer. Pneumoperitoneum was removed, all trocar were removed. All incisions were closed with 4-0 monocryl subcuticular stitch. The patient woke from anesthesia and was brought to PACU in stable condition. All counts were correct  Findings: inflamed gallbladder with 2 large stones  Specimen: gallbladder  Blood loss: Total I/O In: 2932.5 [I.V.:2932.5] Out: -  ml  Local anesthesia: 30 ml 0.5% marcaine  Complications: none  PLAN OF CARE: Admit to inpatient   PATIENT DISPOSITION:  PACU - hemodynamically stable.  Feliciana Rossetti, M.D. General, Bariatric, & Minimally Invasive Surgery [facility]

## 2016-12-23 NOTE — Care Management Note (Signed)
Case Management Note  Patient Details  Name: Dawn Braun MRN: [id number] Date of Birth: 12-01-1965  Subjective/Objective:                  Acute Cholecytits  Action/Plan: Date:  December 23, 2016 Chart reviewed for concurrent status and case management needs.  Will continue to follow patient progress.  Discharge Planning: following for needs  Expected discharge date: [id number]  Marcelle Smiling, BSN, [place], [place]   [phone]  Expected Discharge Date:                  Expected Discharge Plan:  Home/Self Care  In-House Referral:     Discharge planning Services  CM Consult  Post Acute Care Choice:    Choice offered to:     DME Arranged:    DME Agency:     HH Arranged:    HH Agency:     Status of Service:  In process, will continue to follow  If discussed at Long Length of Stay Meetings, dates discussed:    Additional Comments:  Golda Acre, RN 12/23/2016, 9:22 AM

## 2016-12-24 ENCOUNTER — Ambulatory Visit (HOSPITAL_COMMUNITY): Payer: 59

## 2016-12-24 DIAGNOSIS — E118 Type 2 diabetes mellitus with unspecified complications: Secondary | ICD-10-CM | POA: Diagnosis not present

## 2016-12-24 DIAGNOSIS — K8010 Calculus of gallbladder with chronic cholecystitis without obstruction: Secondary | ICD-10-CM | POA: Diagnosis not present

## 2016-12-24 DIAGNOSIS — R1011 Right upper quadrant pain: Secondary | ICD-10-CM

## 2016-12-24 LAB — BASIC METABOLIC PANEL
Anion gap: 8 (ref 5–15)
BUN: 25 mg/dL — ABNORMAL HIGH (ref 6–20)
CO2: 23 mmol/L (ref 22–32)
Calcium: 7.8 mg/dL — ABNORMAL LOW (ref 8.9–10.3)
Chloride: 103 mmol/L (ref 101–111)
Creatinine, Ser: 1.67 mg/dL — ABNORMAL HIGH (ref 0.44–1.00)
GFR calc Af Amer: 40 mL/min — ABNORMAL LOW (ref >60)
GFR calc non Af Amer: 34 mL/min — ABNORMAL LOW (ref >60)
Glucose, Bld: 232 mg/dL — ABNORMAL HIGH (ref 65–99)
Potassium: 4.3 mmol/L (ref 3.5–5.1)
Sodium: 134 mmol/L — ABNORMAL LOW (ref 135–145)

## 2016-12-24 LAB — URINALYSIS, ROUTINE W REFLEX MICROSCOPIC
Bilirubin Urine: NEGATIVE
Glucose, UA: 50 mg/dL — AB
Ketones, ur: NEGATIVE mg/dL
Nitrite: NEGATIVE
Protein, ur: >=300 mg/dL — AB
Specific Gravity, Urine: 1.02 (ref 1.005–1.030)
pH: 6 (ref 5.0–8.0)

## 2016-12-24 LAB — HEPATIC FUNCTION PANEL
ALT: 30 U/L (ref 14–54)
AST: 45 U/L — ABNORMAL HIGH (ref 15–41)
Albumin: 3.2 g/dL — ABNORMAL LOW (ref 3.5–5.0)
Alkaline Phosphatase: 79 U/L (ref 38–126)
Bilirubin, Direct: <0.1 mg/dL — ABNORMAL LOW (ref 0.1–0.5)
Total Bilirubin: 0.3 mg/dL (ref 0.3–1.2)
Total Protein: 6.5 g/dL (ref 6.5–8.1)

## 2016-12-24 LAB — GLUCOSE, CAPILLARY
Glucose-Capillary: 313 mg/dL — ABNORMAL HIGH (ref 65–99)
Glucose-Capillary: 294 mg/dL — ABNORMAL HIGH (ref 65–99)
Glucose-Capillary: 280 mg/dL — ABNORMAL HIGH (ref 65–99)
Glucose-Capillary: 214 mg/dL — ABNORMAL HIGH (ref 65–99)
Glucose-Capillary: 159 mg/dL — ABNORMAL HIGH (ref 65–99)
Glucose-Capillary: 141 mg/dL — ABNORMAL HIGH (ref 65–99)

## 2016-12-24 LAB — LACTIC ACID, PLASMA
Lactic Acid, Venous: 2.7 mmol/L (ref 0.5–1.9)
Lactic Acid, Venous: 2.3 mmol/L (ref 0.5–1.9)

## 2016-12-24 LAB — CBC
HCT: 30.1 % — ABNORMAL LOW (ref 36.0–46.0)
Hemoglobin: 10.4 g/dL — ABNORMAL LOW (ref 12.0–15.0)
MCH: 28.8 pg (ref 26.0–34.0)
MCHC: 34.6 g/dL (ref 30.0–36.0)
MCV: 83.4 fL (ref 78.0–100.0)
Platelets: 285 10*3/uL (ref 150–400)
RBC: 3.61 MIL/uL — ABNORMAL LOW (ref 3.87–5.11)
RDW: 14.1 % (ref 11.5–15.5)
WBC: 21.9 10*3/uL — ABNORMAL HIGH (ref 4.0–10.5)

## 2016-12-24 LAB — HEMOGLOBIN A1C
Hgb A1c MFr Bld: 6.3 % — ABNORMAL HIGH (ref 4.8–5.6)
Mean Plasma Glucose: 134.11 mg/dL

## 2016-12-24 LAB — LIPASE, BLOOD: Lipase: 16 U/L (ref 11–51)

## 2016-12-24 MED ORDER — FLECAINIDE ACETATE 100 MG PO TABS
100.0000 mg | ORAL_TABLET | Freq: Two times a day (BID) | ORAL | Status: DC
  Administered 2016-12-24 – 2016-12-29 (×10): 100 mg via ORAL
  Filled 2016-12-24 (×13): qty 1

## 2016-12-24 MED ORDER — LACTATED RINGERS IV SOLN
INTRAVENOUS | Status: DC
  Administered 2016-12-24 – 2016-12-25 (×4): via INTRAVENOUS

## 2016-12-24 MED ORDER — LACTATED RINGERS IV BOLUS (SEPSIS)
1000.0000 mL | Freq: Once | INTRAVENOUS | Status: AC
  Administered 2016-12-24: 1000 mL via INTRAVENOUS

## 2016-12-24 MED ORDER — MORPHINE SULFATE (PF) 4 MG/ML IV SOLN
1.0000 mg | INTRAVENOUS | Status: DC | PRN
  Administered 2016-12-25 – 2016-12-28 (×4): 2 mg via INTRAVENOUS
  Filled 2016-12-24 (×4): qty 1

## 2016-12-24 MED ORDER — INSULIN GLARGINE 100 UNIT/ML ~~LOC~~ SOLN: 5.0000 [IU] | Freq: Every day | SUBCUTANEOUS | Status: DC

## 2016-12-24 MED ORDER — LIRAGLUTIDE 18 MG/3ML ~~LOC~~ SOPN: 1.8000 mg | PEN_INJECTOR | Freq: Every day | SUBCUTANEOUS | Status: DC

## 2016-12-24 MED ORDER — HEPARIN SODIUM (PORCINE) 5000 UNIT/ML IJ SOLN
5000.0000 [IU] | Freq: Three times a day (TID) | INTRAMUSCULAR | Status: DC
  Administered 2016-12-24 – 2016-12-25 (×2): 5000 [IU] via SUBCUTANEOUS
  Filled 2016-12-24 (×2): qty 1

## 2016-12-24 NOTE — Consult Note (Addendum)
Medical Consultation   Dawn Braun  ZOX:[id number]  DOB: 13-Jan-1966  DOA: 12/22/2016  PCP: Laurena Slimmer, MD   Requesting physician: Sherrie George, PA-C  Reason for consultation: Persistent Nausea, vomiting; Acute kidney injury   History of Present Illness: Dawn Braun is an 51 y.o. female with a history of afib on eliquis, T2DM on victoza who presented to the ED on 9/18 with intractable N/V and RUQ abdominal pain that stated at 1 PM that day.  She'd had outpatient RUQ [place] that showed gallstones and possible chronic cholecystitis on September 13. She notes that she's had this pain, nausea and vomiting intermittently for the past 3 weeks. She travels pain as sharp, located in the right upper quadrant, coming and going. She didn't note any association with pain to me.    She was admitted to the general surgery for cholecystectomy for symptomatic cholelithiasis.  She had cholecystectomy on 9/19 with findings notable for an inflamed gallbladder with 2 large stones.  Since the surgery, the patient notes that her symptoms essentially have not changed. She has been able unable to eat anything due to persistent nausea, vomiting, abdominal pain. Her pain is described as the same as it was before. She notes 4 episodes of brown emesis today without blood. She denies any fevers, chest pain, diarrhea, dysuria. She does note that she feels slightly short of breath because of the pain. She's had a normal bowel movement post op.  Review of Systems:  Review of Systems  Constitutional: Negative for fever.  Cardiovascular: Negative for chest pain.  Gastrointestinal: Positive for abdominal pain, nausea and vomiting. Negative for diarrhea.  Genitourinary: Negative for dysuria.  Skin: Negative for rash.   As per HPI otherwise 10 point review of systems negative.     Past Medical History: Past Medical History:  Diagnosis Date  . Asthma   . Diabetes mellitus without  complication (HCC)    type 2  . Paroxysmal atrial fibrillation [facility])     Past Surgical History: Past Surgical History:  Procedure Laterality Date  . CHOLECYSTECTOMY N/A 12/23/2016   Procedure: LAPAROSCOPIC CHOLECYSTECTOMY;  Surgeon: Kinsinger, De Blanch, MD;  Location: [facility] ORS;  Service: General;  Laterality: N/A;  . EYE SURGERY Right   . KNEE ARTHROSCOPY Right   . SPLIT NIGHT STUDY  08/06/2015  . TUBAL LIGATION       Allergies:  No Known Allergies   Social History:  reports that she has never smoked. She has never used smokeless tobacco. She reports that she drinks alcohol. She reports that she does not use drugs.   Family History: Family History  Problem Relation Age of Onset  . Epilepsy Father   . Diabetes Mother   . Kidney disease Mother   . Hypertension Mother   . Kidney disease Brother   . Diabetic kidney disease Brother   . Diabetes Brother     Physical Exam: Vitals:   12/23/16 1947 12/23/16 2314 12/24/16 0414 12/24/16 1440  BP: (!) 179/93 (!) 151/92 (!) 158/91 (!) 173/96  Pulse: 100 (!) 103 97 88  Resp: Temp: 98.9 F (37.2 C) 98.5 F (36.9 C) 98.7 F (37.1 C) 99.3 F (37.4 C)  TempSrc: Oral Oral Oral Oral  SpO2: 100% 100% 100% 100%    Constitutional: Appears uncomfortable, tired,  Alert and awake, oriented x3 Eyes: PERLA, EOMI, irises appear normal, anicteric sclera,  ENMT: external ears and nose appear normal, normal hearing            Lips appears normal, oropharynx mucosa, tongue, posterior pharynx appear normal  Neck: neck appears normal, no masses, normal ROM, no thyromegaly, no JVD  CVS: S1-S2 clear, no murmur rubs or gallops, no LE edema, normal pedal pulses  Respiratory:  clear to auscultation bilaterally, no wheezing, rales or rhonchi. Respiratory effort normal. No accessory muscle use.  Abdomen: laparoscopic incisions intact.  TTP most in RUQ, but also mildly to RLQ.  soft, nondistended, normal bowel sounds, no  hepatosplenomegaly. Musculoskeletal: : no cyanosis, clubbing or edema noted bilaterally                       No cva tenderness Neuro: Cranial nerves II-XII intact, strength, sensation, reflexes Psych: judgement and insight appear normal, stable mood and affect, mental status Skin: no rashes or lesions or ulcers, no induration or nodules   Data reviewed:  I have personally reviewed following labs and imaging studies Labs:  CBC:  Recent Labs Lab 12/22/16 2044 12/24/16 1438  WBC 10.9* 21.9*  HGB 13.3 10.4*  HCT 39.0 30.1*  MCV 84.2 83.4  PLT 371 285    Basic Metabolic Panel:  Recent Labs Lab 12/22/16 2044 12/24/16 1438  NA 140 134*  K 3.6 4.3  CL 106 103  CO2 23 23  GLUCOSE 139* 232*  BUN 15 25*  CREATININE 0.72 1.67*  CALCIUM 9.5 7.8*   GFR CrCl cannot be calculated (Unknown ideal weight.). Liver Function Tests:  Recent Labs Lab 12/22/16 2044 12/24/16 1438  AST 29 45*  ALT 20 30  ALKPHOS 106 79  BILITOT 0.4 0.3  PROT 8.2* 6.5  ALBUMIN 4.1 3.2*    Recent Labs Lab 12/22/16 2044  LIPASE 19   No results for input(s): AMMONIA in the last 168 hours. Coagulation profile No results for input(s): INR, PROTIME in the last 168 hours.  Cardiac Enzymes: No results for input(s): CKTOTAL, CKMB, CKMBINDEX, TROPONINI in the last 168 hours. BNP: Invalid input(s): POCBNP CBG:  Recent Labs Lab 12/23/16 2317 12/24/16 0416 12/24/16 0739 12/24/16 1143 12/24/16 1626  GLUCAP 291* 313* 294* 280* 214*   D-Dimer No results for input(s): DDIMER in the last 72 hours. Hgb A1c No results for input(s): HGBA1C in the last 72 hours. Lipid Profile No results for input(s): CHOL, HDL, LDLCALC, TRIG, CHOLHDL, LDLDIRECT in the last 72 hours. Thyroid function studies No results for input(s): TSH, T4TOTAL, T3FREE, THYROIDAB in the last 72 hours.  Invalid input(s): FREET3 Anemia work up No results for input(s): VITAMINB12, FOLATE, FERRITIN, TIBC, IRON, RETICCTPCT in the  last 72 hours. Urinalysis    Component Value Date/Time   COLORURINE YELLOW 12/23/2016 0029   APPEARANCEUR HAZY (A) 12/23/2016 0029   LABSPEC 1.017 12/23/2016 0029   PHURINE 8.0 12/23/2016 0029   GLUCOSEU NEGATIVE 12/23/2016 0029   HGBUR SMALL (A) 12/23/2016 0029   BILIRUBINUR NEGATIVE 12/23/2016 0029   KETONESUR 5 (A) 12/23/2016 0029   PROTEINUR >=300 (A) 12/23/2016 0029   NITRITE NEGATIVE 12/23/2016 0029   LEUKOCYTESUR NEGATIVE 12/23/2016 0029     Microbiology Recent Results (from the past 240 hour(s))  Surgical PCR screen     Status: None   Collection Time: 12/23/16  3:20 PM  Result Value Ref Range Status   MRSA, PCR NEGATIVE NEGATIVE Final   Staphylococcus aureus NEGATIVE NEGATIVE Final    Comment: (NOTE) The Xpert SA Assay (FDA approved for  NASAL specimens in patients 63 years of age and older), is one component of a comprehensive surveillance program. It is not intended to diagnose infection nor to guide or monitor treatment.        Inpatient Medications:   Scheduled Meds: . flecainide  100 mg Oral BID  . insulin aspart  0-15 Units Subcutaneous Q4H   Continuous Infusions: . lactated ringers    . lactated ringers 100 mL/hr at 12/24/16 1543     Radiological Exams on Admission: US Abdomen Limited Ruq  Result Date: 12/23/2016 CLINICAL DATA:  Nausea and vomiting for 3 weeks EXAM: ULTRASOUND ABDOMEN LIMITED RIGHT UPPER QUADRANT COMPARISON:  Ultrasound 12/17/2016 FINDINGS: Gallbladder: Shadowing stones, measuring up to 1.8 cm. Negative sonographic Murphy. Normal wall thickness. Common bile duct: Diameter: 4.4 mm Liver: No focal lesion identified. Within normal limits in parenchymal echogenicity. Portal vein is patent on color Doppler imaging with normal direction of blood flow towards the liver. IMPRESSION: Cholelithiasis without sonographic evidence for acute cholecystitis or biliary dilatation. Electronically Signed   By: Jasmine Pang M.D.   On: 12/23/2016 00:31     Impression/Recommendations Principal Problem:   Cholelithiasis with chronic cholecystitis Active Problems:   Biliary colic   Diabetes mellitus (HCC)  Nausea, vomiting, abdominal pain  Cholelithiasis s/p cholecystectomy:  Persistent post op, without notable improvement.  Will follow up CMP, CBC, lipase (at this point only notable for mildly elevated AST), UA.  If symptoms are persistent would recommend imaging with CT abdomen/pelvis tomorrow.  - continue antiemetics as ordered (zofran, compazine, phenergan) - will check EKG to evaluate QT with above - morphine for pain as ordered  Acute Kidney Injury (1.67, baseline .68) : I suspect this is most likely due to dehydration with her nausea/vomiting that has been persistent post op.  Notably she has proteinuria and hematuria in previous UA's from 9/19 and 3/18 (looks like cx from 3/14 positive for GBS) - although, she's on her period, which could explain this.     - bolus IVF - f/u UA, urine sodium and creatinine - protein/creatinine ratio - may need GN labs if persistent hematuria/proteinuria (possible nephrology vs urology? - but maybe due to period as above)  - f/u AM creatinine, if no improvement f/u with renal US   Leukocytosis:  No fevers or tachycardia, but with leukocytosis postop.  She does not appear septic.  Suspect that this is most likely due to her recent surgery, but follow closely.  - daily CBC - lactate   Atrial fibrillation: Chadvasc 2 for F/DM.  - continue flecainide and diltiazem - holding eliquis for now with AKI and acute issues, will start heparin for DVT ppx  Type 2 diabetes:  Suspect hyperglycemia related to D5 fluids which have been d/c'd.  Will hold victoza, continue SSI.  Will hold off on basal insulin for now with A1c 6.3, ctm CBG's.   Thank you for this consultation.  Our [facility] hospitalist team will follow the patient with you.   Time Spent: 1 hour   Lacretia Nicks M.D. [facility] 12/24/2016,  5:57 PM

## 2016-12-24 NOTE — Progress Notes (Signed)
Labs reviewed, she has a an anion gap, creatinine has doubled.  I have ask Medicine to see and Dr. Lowell Guitar will see her for [place].

## 2016-12-24 NOTE — Progress Notes (Signed)
  Progress Note: General Surgery Service   Assessment/Plan: Patient Active Problem List   Diagnosis Date Noted  . Cholelithiasis with chronic cholecystitis 12/23/2016  . Biliary colic 12/23/2016  . Diabetes mellitus (HCC) 12/23/2016   s/p Procedure(s): LAPAROSCOPIC CHOLECYSTECTOMY 12/23/2016 Pain and nausea improved, still some nausea -recheck later today, possible dc -encourage ambulation    LOS: 0 days  Chief Complaint/Subjective: Nausea and pain improved, vomited 1x overnight  Objective: Vital signs in last 24 hours: Temp:  [97.9 F (36.6 C)-98.9 F (37.2 C)] 98.7 F (37.1 C) (09/20 0414) Pulse Rate:  [84-103] 97 (09/20 0414) Resp:  [14-20] 18 (09/20 0414) BP: (151-188)/(91-109) 158/91 (09/20 0414) SpO2:  [100 %] 100 % (09/20 0414) Last BM Date: 12/22/16  Intake/Output from previous day: 09/19 0701 - 09/20 0700 In: 3332.5 [I.V.:3332.5] Out: -  Intake/Output this shift: No intake/output data recorded.  Lungs: CTAb  Cardiovascular: RRR  Abd: soft, ATTP, incisions c/d/i  Extremities: no edema  Neuro: AOx4  Lab Results: CBC   Recent Labs  12/22/16 2044  WBC 10.9*  HGB 13.3  HCT 39.0  PLT 371   BMET  Recent Labs  12/22/16 2044  NA 140  K 3.6  CL 106  CO2 23  GLUCOSE 139*  BUN 15  CREATININE 0.72  CALCIUM 9.5   PT/INR No results for input(s): LABPROT, INR in the last 72 hours. ABG No results for input(s): PHART, HCO3 in the last 72 hours.  Invalid input(s): PCO2, PO2  Studies/Results:  Anti-infectives: Anti-infectives    Start     Dose/Rate Route Frequency Ordered Stop   12/23/16 0200  cefTRIAXone (ROCEPHIN) 2 g in dextrose 5 % 50 mL IVPB     2 g 100 mL/hr over 30 Minutes Intravenous Every 24 hours 12/23/16 0148        Medications: Scheduled Meds: . insulin aspart  0-15 Units Subcutaneous Q4H   Continuous Infusions: . cefTRIAXone (ROCEPHIN)  IV 2 g (12/24/16 0247)  . dextrose 5 % and 0.45 % NaCl with KCl 20 mEq/L 150 mL/hr  at 12/24/16 0726   PRN Meds:.acetaminophen **OR** acetaminophen, HYDROcodone-acetaminophen, HYDROmorphone (DILAUDID) injection, ondansetron **OR** ondansetron (ZOFRAN) IV, prochlorperazine, promethazine  Rodman Pickle, MD Pg# [phone] [facility]

## 2016-12-24 NOTE — Progress Notes (Signed)
1 Day Post-Op    CC:RUQ pain, unrelenting biliary colic, nausea and emesis  Subjective: Still unable to eat, she vomited earlier this afternoon with some clears Vomited x 4 yesterday and at least once this AM. No labs since admit  Objective: Vital signs in last 24 hours: Temp:  [97.9 F (36.6 C)-98.9 F (37.2 C)] 98.7 F (37.1 C) (09/20 0414) Pulse Rate:  [84-103] 97 (09/20 0414) Resp:  [14-20] 18 (09/20 0414) BP: (151-188)/(91-109) 158/91 (09/20 0414) SpO2:  [100 %] 100 % (09/20 0414) Last BM Date: 12/22/16 BMx 1 Emesis x 5 IV fluids 3300 yesterday Afebrile, BP is up sats good on Room air  Glucose in the 290 up to 313 range since yesterday PM No IOC NO labs today - since 12/22/16 Admit UA is negative   Intake/Output from previous day: 09/19 0701 - 09/20 0700 In: 3332.5 [I.V.:3332.5] Out: -  Intake/Output this shift: No intake/output data recorded.  General appearance: alert, cooperative and afraid to eat ongoing nausea, no appetite.   GI: soft, few BS, port sites look fine.  Continues to complain of pain  Lab Results:   Recent Labs  12/22/16 2044  WBC 10.9*  HGB 13.3  HCT 39.0  PLT 371    BMET  Recent Labs  12/22/16 2044  NA 140  K 3.6  CL 106  CO2 23  GLUCOSE 139*  BUN 15  CREATININE 0.72  CALCIUM 9.5   PT/INR No results for input(s): LABPROT, INR in the last 72 hours.   Recent Labs Lab 12/22/16 2044  AST 29  ALT 20  ALKPHOS 106  BILITOT 0.4  PROT 8.2*  ALBUMIN 4.1     Lipase     Component Value Date/Time   LIPASE 19 12/22/2016 2044     Prior to Admission medications   Medication Sig Start Date End Date Taking? Authorizing Provider  apixaban (ELIQUIS) 5 MG TABS tablet Take 1 tablet (5 mg total) by mouth 2 (two) times daily. 07/11/15  Yes Newman Nip, NP  CARTIA XT 120 MG 24 hr capsule TAKE 1 CAPSULE BY MOUTH ONCE DAILY. NEED APPT FOR FURTHER REFILLS 12/17/16  Yes Newman Nip, NP  flecainide (TAMBOCOR) 100 MG tablet  Take 1 tablet (100 mg total) by mouth 2 (two) times daily. 03/03/16  Yes Newman Nip, NP  VICTOZA 18 MG/3ML SOPN Inject 1.8 mg into the skin daily. 08/17/15  Yes [provider]  albuterol (PROVENTIL) (2.5 MG/3ML) 0.083% nebulizer solution Take 3 mLs (2.5 mg total) by nebulization every 6 (six) hours as needed for wheezing or shortness of breath. Patient not taking: Reported on 12/22/2016 01/01/16   Deborha Payment, PA-C  cephALEXin (KEFLEX) 500 MG capsule Take 2 capsules (1,000 mg total) by mouth 2 (two) times daily. Patient not taking: Reported on 12/22/2016 06/17/16   Fayrene Helper, PA-C  diltiazem (CARDIZEM) 30 MG tablet Cardizem  -- take 1 tablet by mouth every 4 hours AS NEEDED for heart rate >100 as long as blood pressure >100. 07/01/15   Newman Nip, NP  HYDROcodone-acetaminophen (NORCO/VICODIN) 5-325 MG tablet Take 2 tablets by mouth every 6 (six) hours as needed for moderate pain. Patient not taking: Reported on 12/22/2016 06/17/16   Fayrene Helper, PA-C  ondansetron (ZOFRAN) 4 MG tablet Take 1 tablet (4 mg total) by mouth every 6 (six) hours. Patient not taking: Reported on 12/22/2016 06/17/16   Fayrene Helper, PA-C   Real  Meds:     Medications: . flecainide  100 mg Oral BID  . insulin aspart  0-15 Units Subcutaneous Q4H  . [START ON 12/25/2016] liraglutide  1.8 mg Subcutaneous Daily    Assessment/Plan Cholelithiasis S/p laparoscopic cholecystectomy, 12/23/16, Dr. Feliciana Rossetti.   Type II diabetes - uncertain of home control Atrial fibrillation on Flecanide and Cartia Chronic anticoagulation on  Eliquis Obesity Snoring  Plan:  I am going to stop the Dilaudid, check her labs and may get a Medicine consult if labs are showing signs of DKA. I was going to restart the Victoza and give her some Toradol for pain to try and get her up, but will wait for the labs and see where we are.           LOS: 0 days    Adriell Polansky 12/24/2016 [phone]

## 2016-12-25 ENCOUNTER — Observation Stay (HOSPITAL_COMMUNITY): Payer: 59

## 2016-12-25 DIAGNOSIS — J45909 Unspecified asthma, uncomplicated: Secondary | ICD-10-CM | POA: Diagnosis present

## 2016-12-25 DIAGNOSIS — I480 Paroxysmal atrial fibrillation: Secondary | ICD-10-CM | POA: Diagnosis present

## 2016-12-25 DIAGNOSIS — D649 Anemia, unspecified: Secondary | ICD-10-CM | POA: Diagnosis present

## 2016-12-25 DIAGNOSIS — Z6837 Body mass index (BMI) 37.0-37.9, adult: Secondary | ICD-10-CM | POA: Diagnosis not present

## 2016-12-25 DIAGNOSIS — Z7901 Long term (current) use of anticoagulants: Secondary | ICD-10-CM | POA: Diagnosis not present

## 2016-12-25 DIAGNOSIS — N179 Acute kidney failure, unspecified: Secondary | ICD-10-CM | POA: Diagnosis present

## 2016-12-25 DIAGNOSIS — Z820 Family history of epilepsy and other diseases of the nervous system: Secondary | ICD-10-CM | POA: Diagnosis not present

## 2016-12-25 DIAGNOSIS — K8010 Calculus of gallbladder with chronic cholecystitis without obstruction: Secondary | ICD-10-CM | POA: Diagnosis present

## 2016-12-25 DIAGNOSIS — E872 Acidosis: Secondary | ICD-10-CM | POA: Diagnosis present

## 2016-12-25 DIAGNOSIS — R1011 Right upper quadrant pain: Secondary | ICD-10-CM | POA: Diagnosis present

## 2016-12-25 DIAGNOSIS — R0683 Snoring: Secondary | ICD-10-CM | POA: Diagnosis present

## 2016-12-25 DIAGNOSIS — I10 Essential (primary) hypertension: Secondary | ICD-10-CM | POA: Diagnosis present

## 2016-12-25 DIAGNOSIS — K660 Peritoneal adhesions (postprocedural) (postinfection): Secondary | ICD-10-CM | POA: Diagnosis present

## 2016-12-25 DIAGNOSIS — Z833 Family history of diabetes mellitus: Secondary | ICD-10-CM | POA: Diagnosis not present

## 2016-12-25 DIAGNOSIS — K661 Hemoperitoneum: Secondary | ICD-10-CM | POA: Diagnosis not present

## 2016-12-25 DIAGNOSIS — E860 Dehydration: Secondary | ICD-10-CM | POA: Diagnosis present

## 2016-12-25 DIAGNOSIS — E669 Obesity, unspecified: Secondary | ICD-10-CM | POA: Diagnosis present

## 2016-12-25 DIAGNOSIS — Z8249 Family history of ischemic heart disease and other diseases of the circulatory system: Secondary | ICD-10-CM | POA: Diagnosis not present

## 2016-12-25 DIAGNOSIS — Z841 Family history of disorders of kidney and ureter: Secondary | ICD-10-CM | POA: Diagnosis not present

## 2016-12-25 DIAGNOSIS — E119 Type 2 diabetes mellitus without complications: Secondary | ICD-10-CM | POA: Diagnosis present

## 2016-12-25 LAB — CBC
HCT: 26.1 % — ABNORMAL LOW (ref 36.0–46.0)
Hemoglobin: 8.9 g/dL — ABNORMAL LOW (ref 12.0–15.0)
MCH: 28.7 pg (ref 26.0–34.0)
MCHC: 34.1 g/dL (ref 30.0–36.0)
MCV: 84.2 fL (ref 78.0–100.0)
Platelets: 296 10*3/uL (ref 150–400)
RBC: 3.1 MIL/uL — ABNORMAL LOW (ref 3.87–5.11)
RDW: 14.2 % (ref 11.5–15.5)
WBC: 20.9 10*3/uL — ABNORMAL HIGH (ref 4.0–10.5)

## 2016-12-25 LAB — COMPREHENSIVE METABOLIC PANEL
ALT: 51 U/L (ref 14–54)
AST: 59 U/L — ABNORMAL HIGH (ref 15–41)
Albumin: 2.8 g/dL — ABNORMAL LOW (ref 3.5–5.0)
Alkaline Phosphatase: 74 U/L (ref 38–126)
Anion gap: 10 (ref 5–15)
BUN: 23 mg/dL — ABNORMAL HIGH (ref 6–20)
CO2: 23 mmol/L (ref 22–32)
Calcium: 7.7 mg/dL — ABNORMAL LOW (ref 8.9–10.3)
Chloride: 103 mmol/L (ref 101–111)
Creatinine, Ser: 1.16 mg/dL — ABNORMAL HIGH (ref 0.44–1.00)
GFR calc Af Amer: >60 mL/min (ref >60)
GFR calc non Af Amer: 53 mL/min — ABNORMAL LOW (ref >60)
Glucose, Bld: 171 mg/dL — ABNORMAL HIGH (ref 65–99)
Potassium: 4 mmol/L (ref 3.5–5.1)
Sodium: 136 mmol/L (ref 135–145)
Total Bilirubin: 0.5 mg/dL (ref 0.3–1.2)
Total Protein: 6 g/dL — ABNORMAL LOW (ref 6.5–8.1)

## 2016-12-25 LAB — GLUCOSE, CAPILLARY
Glucose-Capillary: 168 mg/dL — ABNORMAL HIGH (ref 65–99)
Glucose-Capillary: 169 mg/dL — ABNORMAL HIGH (ref 65–99)
Glucose-Capillary: 141 mg/dL — ABNORMAL HIGH (ref 65–99)
Glucose-Capillary: 144 mg/dL — ABNORMAL HIGH (ref 65–99)
Glucose-Capillary: 161 mg/dL — ABNORMAL HIGH (ref 65–99)

## 2016-12-25 LAB — PROTEIN / CREATININE RATIO, URINE
Creatinine, Urine: 277.32 mg/dL
Protein Creatinine Ratio: 0.69 mg/mg{Cre} — ABNORMAL HIGH (ref 0.00–0.15)
Total Protein, Urine: 190 mg/dL

## 2016-12-25 LAB — CREATININE, URINE, RANDOM: Creatinine, Urine: 278.42 mg/dL

## 2016-12-25 LAB — LACTIC ACID, PLASMA: Lactic Acid, Venous: 1.7 mmol/L (ref 0.5–1.9)

## 2016-12-25 LAB — SODIUM, URINE, RANDOM: Sodium, Ur: 61 mmol/L

## 2016-12-25 MED ORDER — METOPROLOL TARTRATE 5 MG/5ML IV SOLN
5.0000 mg | Freq: Once | INTRAVENOUS | Status: AC
  Administered 2016-12-25: 5 mg via INTRAVENOUS
  Filled 2016-12-25: qty 5

## 2016-12-25 MED ORDER — IOPAMIDOL (ISOVUE-300) INJECTION 61%: 30.0000 mL | Freq: Once | INTRAVENOUS | Status: DC | PRN

## 2016-12-25 MED ORDER — IOPAMIDOL (ISOVUE-300) INJECTION 61%
INTRAVENOUS | Status: AC
  Administered 2016-12-25: 15 mL
  Filled 2016-12-25: qty 30

## 2016-12-25 MED ORDER — HYDRALAZINE HCL 20 MG/ML IJ SOLN
10.0000 mg | Freq: Four times a day (QID) | INTRAMUSCULAR | Status: DC | PRN
  Administered 2016-12-26: 10 mg via INTRAVENOUS
  Filled 2016-12-25: qty 1

## 2016-12-25 MED ORDER — METOPROLOL TARTRATE 25 MG PO TABS
25.0000 mg | ORAL_TABLET | Freq: Two times a day (BID) | ORAL | Status: DC
  Administered 2016-12-25 – 2016-12-29 (×9): 25 mg via ORAL
  Filled 2016-12-25 (×9): qty 1

## 2016-12-25 NOTE — Progress Notes (Signed)
PROGRESS NOTE    Dawn Braun  ZOX:[id number] DOB: 08-12-1965 DOA: 12/22/2016 PCP: Laurena Slimmer, MD   Brief Narrative: This is a 51 year old African-American female who presented with nausea, vomiting, abdominal pain and was noted to have cholelithiasis with cholecystitis for which she has undergone cholecystectomy on 9/19. She has developed some acute kidney injury since her operation for which we are consulted. Of note, she has atrial fibrillation on chronic anticoagulation with Eliquis and also is a type II diabetic.  This morning her creatinine appears to have improved on lab work, but her leukocytosis has slightly worsened and she has had a very mild drop in her hemoglobin. She complains of some ongoing right upper quadrant pain and nausea especially worsened with movement. No other acute overnight events have been noted. I have discussed the case with her surgeon who plans to repeat CT of the abdomen.   Assessment & Plan:   Principal Problem:   Cholelithiasis with chronic cholecystitis Active Problems:   Biliary colic   Diabetes mellitus (HCC)   Nausea, vomiting, abdominal pain  Cholelithiasis s/p cholecystectomy 9/19:  Persistent post op, without notable improvement - continue antiemetics as ordered (zofran, compazine, phenergan) - EKG with QTc 456 - morphine for pain as ordered -CT abdomen per primary team for further evaluation -NPO for now  Acute Kidney Injury (1.67>>1.16, baseline .68) likely prerenal :  - IVF to continue -AM renal panel  Leukocytosis-stable; likely related to recent surgery: - daily CBC to monitor  Atrial fibrillation with RVR: Chadvasc 2 for F/DM.  - continue flecainide and diltiazem - holding eliquis for now with AKI and acute issues -metoprolol IV x one dose and then bid oral for rate control -SCDs instead of heparin for DVT proph due to some worsening anemia -repeat EKG today  Type 2 diabetes (A1c 6.3%):  -Continue with SSI  for now and adjust if BG readings>250mg /dL  Hypertension-poor control -Hydralazine prn added   DVT prophylaxis: Heparin to SCDs today Code Status: Full Family Communication: w/ patient Disposition Plan: Home in 1-2 days    Procedures: s/p cholecystectomy on 9/19   Antimicrobials: None  Objective: Vitals:   12/24/16 1440 12/24/16 1948 12/25/16 0410 12/25/16 1015  BP: (!) 173/96 (!) 167/92 (!) 176/83 (!) 184/91  Pulse: 88 (!) 110 (!) 126 (!) 123  Resp:  Temp: 99.3 F (37.4 C) 98.9 F (37.2 C) 98.4 F (36.9 C)   TempSrc: Oral Oral Oral   SpO2: 100% 98% 97% 98%    Intake/Output Summary (Last 24 hours) at 12/25/16 1142 Last data filed at 12/25/16 1040  Gross per 24 hour  Intake          1740.83 ml  Output              900 ml  Net           840.83 ml   There were no vitals filed for this visit.  Examination:  General exam: Appears calm and comfortable  Respiratory system: Clear to auscultation. Respiratory effort normal. Cardiovascular system: S1 & S2 heard, RRR. No JVD, murmurs, rubs, gallops or clicks. No pedal edema. Gastrointestinal system: Abdomen is nondistended, soft, tender throughout all 4 quadrants. No organomegaly or masses felt. Normal bowel sounds heard. Central nervous system: Alert and oriented. No focal neurological deficits. Extremities: Symmetric 5 x 5 power. Skin: No rashes, lesions or ulcers Psychiatry: Judgement and insight appear normal. Mood & affect appropriate.     Data Reviewed:  I have personally reviewed following labs and imaging studies  CBC:  Recent Labs Lab 12/22/16 2044 12/24/16 1438 12/25/16 0603  WBC 10.9* 21.9* 20.9*  HGB 13.3 10.4* 8.9*  HCT 39.0 30.1* 26.1*  MCV 84.2 83.4 84.2  PLT 371 285 296   Basic Metabolic Panel:  Recent Labs Lab 12/22/16 2044 12/24/16 1438 12/25/16 0603  NA 140 134* 136  K 3.6 4.3 4.0  CL 106 103 103  CO2 GLUCOSE 139* 232* 171*  BUN 15 25* 23*  CREATININE 0.72  1.67* 1.16*  CALCIUM 9.5 7.8* 7.7*   GFR: CrCl cannot be calculated (Unknown ideal weight.). Liver Function Tests:  Recent Labs Lab 12/22/16 2044 12/24/16 1438 12/25/16 0603  AST 29 45* 59*  ALT 20 30 51  ALKPHOS 106 79 74  BILITOT 0.4 0.3 0.5  PROT 8.2* 6.5 6.0*  ALBUMIN 4.1 3.2* 2.8*    Recent Labs Lab 12/22/16 2044 12/24/16 1829  LIPASE 19 16   No results for input(s): AMMONIA in the last 168 hours. Coagulation Profile: No results for input(s): INR, PROTIME in the last 168 hours. Cardiac Enzymes: No results for input(s): CKTOTAL, CKMB, CKMBINDEX, TROPONINI in the last 168 hours. BNP (last 3 results) No results for input(s): PROBNP in the last 8760 hours. HbA1C:  Recent Labs  12/24/16 1438  HGBA1C 6.3*   CBG:  Recent Labs Lab 12/24/16 1626 12/24/16 2044 12/24/16 2331 12/25/16 0408 12/25/16 0755  GLUCAP 214* 159* 141* 168* 169*   Lipid Profile: No results for input(s): CHOL, HDL, LDLCALC, TRIG, CHOLHDL, LDLDIRECT in the last 72 hours. Thyroid Function Tests: No results for input(s): TSH, T4TOTAL, FREET4, T3FREE, THYROIDAB in the last 72 hours. Anemia Panel: No results for input(s): VITAMINB12, FOLATE, FERRITIN, TIBC, IRON, RETICCTPCT in the last 72 hours. Sepsis Labs:  Recent Labs Lab 12/24/16 1829 12/24/16 2057 12/24/16 2339  LATICACIDVEN 2.7* 2.3* 1.7    Recent Results (from the past 240 hour(s))  Surgical PCR screen     Status: None   Collection Time: 12/23/16  3:20 PM  Result Value Ref Range Status   MRSA, PCR NEGATIVE NEGATIVE Final   Staphylococcus aureus NEGATIVE NEGATIVE Final    Comment: (NOTE) The Xpert SA Assay (FDA approved for NASAL specimens in patients 54 years of age and older), is one component of a comprehensive surveillance program. It is not intended to diagnose infection nor to guide or monitor treatment.       Radiology Studies: No results found.    Scheduled Meds: . flecainide  100 mg Oral BID  . insulin  aspart  0-15 Units Subcutaneous Q4H  . metoprolol tartrate  25 mg Oral BID   Continuous Infusions: . lactated ringers 125 mL/hr at 12/24/16 1930    LOS: 2 days   Time spent: 30 minutes    Thaxton Pelley Hoover Brunette, MD [facility] Pager [phone]  If 7PM-7AM, please contact night-coverage [email or web address] Password [facility] 12/25/2016, 11:42 AM

## 2016-12-25 NOTE — Progress Notes (Signed)
  Progress Note: General Surgery Service   Assessment/Plan: Patient Active Problem List   Diagnosis Date Noted  . Cholelithiasis with chronic cholecystitis 12/23/2016  . Biliary colic 12/23/2016  . Diabetes mellitus (HCC) 12/23/2016   s/p Procedure(s): LAPAROSCOPIC CHOLECYSTECTOMY 12/23/2016 -continued nausea, -Cr improved with fluid, lactic acidosis resolved -CT scan today to evaluate for n/v    LOS: 0 days  Chief Complaint/Subjective: Continued nausea and vomiting especially with movement. Moderate pain in RUQ  Objective: Vital signs in last 24 hours: Temp:  [98.4 F (36.9 C)-99.3 F (37.4 C)] 98.4 F (36.9 C) (09/21 0410) Pulse Rate:  [88-126] 126 (09/21 0410) Resp:  [18] 18 (09/21 0410) BP: (167-176)/(83-96) 176/83 (09/21 0410) SpO2:  [97 %-100 %] 97 % (09/21 0410) Last BM Date: 12/24/16  Intake/Output from previous day: 09/20 0701 - 09/21 0700 In: 1740.8 [I.V.:1690.8; IV Piggyback:50] Out: 200 [Urine:200] Intake/Output this shift: Total I/O In: -  Out: 350 [Urine:350]  Lungs: CTAB  Cardiovascular: RRR  Abd: soft, TTP RUQ, incisions c/d/i  Extremities: no edema  Neuro: AOx4  Lab Results: CBC   Recent Labs  12/24/16 1438 12/25/16 0603  WBC 21.9* 20.9*  HGB 10.4* 8.9*  HCT 30.1* 26.1*  PLT 285 296   BMET  Recent Labs  12/24/16 1438 12/25/16 0603  NA 134* 136  K 4.3 4.0  CL 103 103  CO2 23 23  GLUCOSE 232* 171*  BUN 25* 23*  CREATININE 1.67* 1.16*  CALCIUM 7.8* 7.7*   PT/INR No results for input(s): LABPROT, INR in the last 72 hours. ABG No results for input(s): PHART, HCO3 in the last 72 hours.  Invalid input(s): PCO2, PO2  Studies/Results:  Anti-infectives: Anti-infectives    Start     Dose/Rate Route Frequency Ordered Stop   12/23/16 0200  cefTRIAXone (ROCEPHIN) 2 g in dextrose 5 % 50 mL IVPB  Status:  Discontinued     2 g 100 mL/hr over 30 Minutes Intravenous Every 24 hours 12/23/16 0148 12/24/16 1643       Medications: Scheduled Meds: . flecainide  100 mg Oral BID  . insulin aspart  0-15 Units Subcutaneous Q4H  . metoprolol tartrate  5 mg Intravenous Once  . metoprolol tartrate  25 mg Oral BID   Continuous Infusions: . lactated ringers 125 mL/hr at 12/24/16 1930   PRN Meds:.morphine injection, ondansetron **OR** ondansetron (ZOFRAN) IV, prochlorperazine, promethazine  Rodman Pickle, MD Pg# [phone] [facility]

## 2016-12-26 LAB — GLUCOSE, CAPILLARY
Glucose-Capillary: 124 mg/dL — ABNORMAL HIGH (ref 65–99)
Glucose-Capillary: 131 mg/dL — ABNORMAL HIGH (ref 65–99)
Glucose-Capillary: 142 mg/dL — ABNORMAL HIGH (ref 65–99)
Glucose-Capillary: 147 mg/dL — ABNORMAL HIGH (ref 65–99)
Glucose-Capillary: 147 mg/dL — ABNORMAL HIGH (ref 65–99)
Glucose-Capillary: 149 mg/dL — ABNORMAL HIGH (ref 65–99)

## 2016-12-26 LAB — COMPREHENSIVE METABOLIC PANEL
ALT: 38 U/L (ref 14–54)
AST: 34 U/L (ref 15–41)
Albumin: 2.9 g/dL — ABNORMAL LOW (ref 3.5–5.0)
Alkaline Phosphatase: 79 U/L (ref 38–126)
Anion gap: 10 (ref 5–15)
BUN: 14 mg/dL (ref 6–20)
CO2: 24 mmol/L (ref 22–32)
Calcium: 8.5 mg/dL — ABNORMAL LOW (ref 8.9–10.3)
Chloride: 103 mmol/L (ref 101–111)
Creatinine, Ser: 0.98 mg/dL (ref 0.44–1.00)
GFR calc Af Amer: >60 mL/min (ref >60)
GFR calc non Af Amer: >60 mL/min (ref >60)
Glucose, Bld: 146 mg/dL — ABNORMAL HIGH (ref 65–99)
Potassium: 4 mmol/L (ref 3.5–5.1)
Sodium: 137 mmol/L (ref 135–145)
Total Bilirubin: 0.7 mg/dL (ref 0.3–1.2)
Total Protein: 6.4 g/dL — ABNORMAL LOW (ref 6.5–8.1)

## 2016-12-26 LAB — CBC
HCT: 26.5 % — ABNORMAL LOW (ref 36.0–46.0)
Hemoglobin: 9 g/dL — ABNORMAL LOW (ref 12.0–15.0)
MCH: 28.8 pg (ref 26.0–34.0)
MCHC: 34 g/dL (ref 30.0–36.0)
MCV: 84.9 fL (ref 78.0–100.0)
Platelets: 286 10*3/uL (ref 150–400)
RBC: 3.12 MIL/uL — ABNORMAL LOW (ref 3.87–5.11)
RDW: 14.1 % (ref 11.5–15.5)
WBC: 17 10*3/uL — ABNORMAL HIGH (ref 4.0–10.5)

## 2016-12-26 NOTE — Progress Notes (Signed)
PROGRESS NOTE    Dawn Braun  ZOX:[id number] DOB: May 18, 1965 DOA: 12/22/2016 PCP: Laurena Slimmer, MD   Brief Narrative: This is a 51 year old African-American female who presented with nausea, vomiting, abdominal pain and was noted to have cholelithiasis with cholecystitis for which she has undergone cholecystectomy on 9/19. She has developed some acute kidney injury since her operation for which we are consulted. Of note, she has atrial fibrillation on chronic anticoagulation with Eliquis and also is a type II diabetic. She had some persistent nausea with movement along with worsening anemia for which CT of the abdomen and pelvis was ordered with noted moderate hemoperitoneum.  This morning her creatinine appears to have improved even further as well as her leukocytosis. Hemoglobin and hematocrit remained stable with stable vital signs. She continues to complain of significant nausea and cannot tolerate any oral intake. She is quite uncomfortable with any movement. Her last bowel movement was approximately 2-3 days prior.  Assessment & Plan:   Principal Problem:   Cholelithiasis with chronic cholecystitis Active Problems:   Biliary colic   Diabetes mellitus (HCC)   Nausea, vomiting, abdominal pain  Cholelithiasis s/p cholecystectomy 9/19 now with moderate hemoperitoneum noted on CT:   Persistent post op, without notable improvement - continue antiemetics as ordered (zofran, compazine, phenergan) - EKG with QTc 459 on repeat - morphine for pain as ordered -Carb consistent diet per primary team -Further recommendations per primary team  Acute Kidney Injury (1.67>>0.98, baseline .68) likely prerenal :  - Hold IVF based on high fluid balance and elevated BP -Now on diet -AM renal panel  Leukocytosis-improving; likely related to recent surgery: - daily CBC to monitor  Atrial fibrillation history; currently in sinus tachycardia: - continue flecainide and diltiazem -  holding eliquis for now with AKI and acute issues -SCDs instead of heparin for DVT proph due to hemoperitoneum  Type 2 diabetes (A1c 6.3%):  -Continue with SSI for now and adjust if BG readings>250mg /dL  Hypertension-poor control -Hydralazine prn added -IVF DC   DVT prophylaxis: Heparin to SCDs today Code Status: Full Family Communication: w/ patient Disposition Plan: Home in 1-2 days    Procedures: s/p cholecystectomy on 9/19   Antimicrobials: None  Objective: Vitals:   12/26/16 0548 12/26/16 0642 12/26/16 0900 12/26/16 1000  BP: (!) 182/79 (!) 157/72 (!) 172/87   Pulse: 85  99   Resp: 18     Temp: 99.9 F (37.7 C)  98.9 F (37.2 C)   TempSrc: Oral  Oral   SpO2: 97%  100%   Weight:    95 kg (209 lb 6.4 oz)  Height:     (1.6 m)    Intake/Output Summary (Last 24 hours) at 12/26/16 1153 Last data filed at 12/26/16 0600  Gross per 24 hour  Intake             2375 ml  Output                0 ml  Net             2375 ml   Filed Weights   12/26/16 1000  Weight: 95 kg (209 lb 6.4 oz)    Examination:  General exam: Appears calm and comfortable  Respiratory system: Clear to auscultation. Respiratory effort normal. Cardiovascular system: S1 & S2 heard, RRR. No JVD, murmurs, rubs, gallops or clicks. No pedal edema. Gastrointestinal system: Abdomen is nondistended, soft, tender throughout all 4 quadrants. No organomegaly or masses felt. Normal  bowel sounds heard. Central nervous system: Alert and oriented. No focal neurological deficits. Extremities: Symmetric 5 x 5 power. Skin: No rashes, lesions or ulcers Psychiatry: Judgement and insight appear normal. Mood & affect appropriate.     Data Reviewed: I have personally reviewed following labs and imaging studies  CBC:  Recent Labs Lab 12/22/16 2044 12/24/16 1438 12/25/16 0603 12/26/16 0620  WBC 10.9* 21.9* 20.9* 17.0*  HGB 13.3 10.4* 8.9* 9.0*  HCT 39.0 30.1* 26.1* 26.5*  MCV 84.2 83.4 84.2 84.9    PLT 371 285 296 286   Basic Metabolic Panel:  Recent Labs Lab 12/22/16 2044 12/24/16 1438 12/25/16 0603 12/26/16 0620  NA 140 134* 136 137  K 3.6 4.3 4.0 4.0  CL 106 103 103 103  CO2 GLUCOSE 139* 232* 171* 146*  BUN 15 25* 23* 14  CREATININE 0.72 1.67* 1.16* 0.98  CALCIUM 9.5 7.8* 7.7* 8.5*   GFR: Estimated Creatinine Clearance: 73.6 mL/min (by C-G formula based on SCr of 0.98 mg/dL). Liver Function Tests:  Recent Labs Lab 12/22/16 2044 12/24/16 1438 12/25/16 0603 12/26/16 0620  AST 29 45* 59* 34  ALT 20 30 51 38  ALKPHOS 106 79 74 79  BILITOT 0.4 0.3 0.5 0.7  PROT 8.2* 6.5 6.0* 6.4*  ALBUMIN 4.1 3.2* 2.8* 2.9*    Recent Labs Lab 12/22/16 2044 12/24/16 1829  LIPASE 19 16   No results for input(s): AMMONIA in the last 168 hours. Coagulation Profile: No results for input(s): INR, PROTIME in the last 168 hours. Cardiac Enzymes: No results for input(s): CKTOTAL, CKMB, CKMBINDEX, TROPONINI in the last 168 hours. BNP (last 3 results) No results for input(s): PROBNP in the last 8760 hours. HbA1C:  Recent Labs  12/24/16 1438  HGBA1C 6.3*   CBG:  Recent Labs Lab 12/25/16 1636 12/25/16 2159 12/26/16 0010 12/26/16 0353 12/26/16 0759  GLUCAP 144* 161* 142* 147* 149*   Lipid Profile: No results for input(s): CHOL, HDL, LDLCALC, TRIG, CHOLHDL, LDLDIRECT in the last 72 hours. Thyroid Function Tests: No results for input(s): TSH, T4TOTAL, FREET4, T3FREE, THYROIDAB in the last 72 hours. Anemia Panel: No results for input(s): VITAMINB12, FOLATE, FERRITIN, TIBC, IRON, RETICCTPCT in the last 72 hours. Sepsis Labs:  Recent Labs Lab 12/24/16 1829 12/24/16 2057 12/24/16 2339  LATICACIDVEN 2.7* 2.3* 1.7    Recent Results (from the past 240 hour(s))  Surgical PCR screen     Status: None   Collection Time: 12/23/16  3:20 PM  Result Value Ref Range Status   MRSA, PCR NEGATIVE NEGATIVE Final   Staphylococcus aureus NEGATIVE NEGATIVE Final     Comment: (NOTE) The Xpert SA Assay (FDA approved for NASAL specimens in patients 32 years of age and older), is one component of a comprehensive surveillance program. It is not intended to diagnose infection nor to guide or monitor treatment.       Radiology Studies: Ct Abdomen Pelvis Wo Contrast  Result Date: 12/25/2016 CLINICAL DATA:  Nausea and vomiting. Laparoscopic cholecystectomy 12/23/2016 EXAM: CT ABDOMEN AND PELVIS WITHOUT CONTRAST TECHNIQUE: Multidetector CT imaging of the abdomen and pelvis was performed following the standard protocol without IV contrast. COMPARISON:  06/17/2016 abdominal CT.  Ultrasound 12/23/2016 FINDINGS: Lower chest: Trace right pleural effusion. Lower lobe atelectasis. Minimal anterior pericardial fluid. Hepatobiliary: Gas within the right lobe of the liver could be pneumobilia from cholangiography, or pneumoperitoneum in deep fissures. No masslike findings. Cholecystectomy. There is moderate hemorrhage in the gallbladder fossa tracking in the  interloop space and right pericolic gutter, with small volume hemorrhage in the pelvis. The hemorrhage in the gallbladder fossa region measures up to 6 cm in maximal dimension. Pneumoperitoneum, expected postoperatively. Pancreas: Unremarkable. Spleen: Unremarkable. Adrenals/Urinary Tract: Negative adrenals. No hydronephrosis or stone. Moderately distended bladder, otherwise unremarkable. Stomach/Bowel:  No obstruction. No appendicitis. Vascular/Lymphatic: No acute vascular abnormality. No mass or adenopathy. Reproductive:Fibroid uterus. Calcified fibroid on the right is the best seen and measures 3 cm. Other:   Nonspecific body wall edema. Musculoskeletal: No acute abnormalities. These results were called by telephone at the time of interpretation on 12/25/2016 at 2:59 pm to Dr. Feliciana Rossetti , who verbally acknowledged these results. IMPRESSION: 1. Moderate hemoperitoneum. The majority of clot is in the gallbladder fossa and  pelvic recesses. 2. No low-density collection to suggest biloma. 3. Atelectasis and trace right pleural effusion. 4. Fibroid uterus. Electronically Signed   By: Marnee Spring M.D.   On: 12/25/2016 15:00      Scheduled Meds: . flecainide  100 mg Oral BID  . insulin aspart  0-15 Units Subcutaneous Q4H  . metoprolol tartrate  25 mg Oral BID   Continuous Infusions: . lactated ringers 125 mL/hr at 12/25/16 2151    LOS: 2 days   Time spent: 30 minutes    Laney Louderback Hoover Brunette, MD [facility] Pager [phone]  If 7PM-7AM, please contact night-coverage [email or web address] Password [facility] 12/26/2016, 11:53 AM

## 2016-12-26 NOTE — Progress Notes (Signed)
Assessment Principal Problem:   Cholelithiasis with chronic cholecystitis and unrelenting biliary colic s/p lap chole 12/23/16-had postop bleeding in gallbladder fossa tracking down right gutter; some ileus Active Problems:      Diabetes mellitus (HCC)-cbgs under good control   Plan:  Back to full liquids.  Recheck CBC tomorrow.   LOS: 1 day     3 Days Post-Op  Chief Complaint/Subjective: Not tolerating solid diet-vomited.  No BM.  Objective: Vital signs in last 24 hours: Temp:  [98.8 F (37.1 C)-99.9 F (37.7 C)] 98.9 F (37.2 C) (09/22 0900) Pulse Rate:  [85-105] 99 (09/22 0900) Resp:  [18-20] 18 (09/22 0548) BP: (157-182)/(72-98) 172/87 (09/22 0900) SpO2:  [97 %-100 %] 100 % (09/22 0900) Weight:  [95 kg (209 lb 6.4 oz)] 95 kg (209 lb 6.4 oz) (09/22 1000) Last BM Date: 12/23/16  Intake/Output from previous day: 09/21 0701 - 09/22 0700 In: 2875 [I.V.:2875] Out: 700 [Urine:700] Intake/Output this shift: No intake/output data recorded.  PE: General- In NAD.  Awake and alert. Abdomen-soft, not tender, incisions are clean and intact  Lab Results:   Recent Labs  12/25/16 0603 12/26/16 0620  WBC 20.9* 17.0*  HGB 8.9* 9.0*  HCT 26.1* 26.5*  PLT 296 286   BMET  Recent Labs  12/25/16 0603 12/26/16 0620  NA 136 137  K 4.0 4.0  CL 103 103  CO2 23 24  GLUCOSE 171* 146*  BUN 23* 14  CREATININE 1.16* 0.98  CALCIUM 7.7* 8.5*   PT/INR No results for input(s): LABPROT, INR in the last 72 hours. Comprehensive Metabolic Panel:    Component Value Date/Time   NA 137 12/26/2016 0620   NA 136 12/25/2016 0603   K 4.0 12/26/2016 0620   K 4.0 12/25/2016 0603   CL 103 12/26/2016 0620   CL 103 12/25/2016 0603   CO2 24 12/26/2016 0620   CO2 23 12/25/2016 0603   BUN 14 12/26/2016 0620   BUN 23 (H) 12/25/2016 0603   CREATININE 0.98 12/26/2016 0620   CREATININE 1.16 (H) 12/25/2016 0603   GLUCOSE 146 (H) 12/26/2016 0620   GLUCOSE 171 (H) 12/25/2016 0603   CALCIUM  8.5 (L) 12/26/2016 0620   CALCIUM 7.7 (L) 12/25/2016 0603   AST 34 12/26/2016 0620   AST 59 (H) 12/25/2016 0603   ALT 38 12/26/2016 0620   ALT 51 12/25/2016 0603   ALKPHOS 79 12/26/2016 0620   ALKPHOS 74 12/25/2016 0603   BILITOT 0.7 12/26/2016 0620   BILITOT 0.5 12/25/2016 0603   PROT 6.4 (L) 12/26/2016 0620   PROT 6.0 (L) 12/25/2016 0603   ALBUMIN 2.9 (L) 12/26/2016 0620   ALBUMIN 2.8 (L) 12/25/2016 0603     Studies/Results: Ct Abdomen Pelvis Wo Contrast  Result Date: 12/25/2016 CLINICAL DATA:  Nausea and vomiting. Laparoscopic cholecystectomy 12/23/2016 EXAM: CT ABDOMEN AND PELVIS WITHOUT CONTRAST TECHNIQUE: Multidetector CT imaging of the abdomen and pelvis was performed following the standard protocol without IV contrast. COMPARISON:  06/17/2016 abdominal CT.  Ultrasound 12/23/2016 FINDINGS: Lower chest: Trace right pleural effusion. Lower lobe atelectasis. Minimal anterior pericardial fluid. Hepatobiliary: Gas within the right lobe of the liver could be pneumobilia from cholangiography, or pneumoperitoneum in deep fissures. No masslike findings. Cholecystectomy. There is moderate hemorrhage in the gallbladder fossa tracking in the interloop space and right pericolic gutter, with small volume hemorrhage in the pelvis. The hemorrhage in the gallbladder fossa region measures up to 6 cm in maximal dimension. Pneumoperitoneum, expected postoperatively. Pancreas: Unremarkable. Spleen: Unremarkable. Adrenals/Urinary Tract:  Negative adrenals. No hydronephrosis or stone. Moderately distended bladder, otherwise unremarkable. Stomach/Bowel:  No obstruction. No appendicitis. Vascular/Lymphatic: No acute vascular abnormality. No mass or adenopathy. Reproductive:Fibroid uterus. Calcified fibroid on the right is the best seen and measures 3 cm. Other:   Nonspecific body wall edema. Musculoskeletal: No acute abnormalities. These results were called by telephone at the time of interpretation on 12/25/2016 at  2:59 pm to Dr. Feliciana Rossetti , who verbally acknowledged these results. IMPRESSION: 1. Moderate hemoperitoneum. The majority of clot is in the gallbladder fossa and pelvic recesses. 2. No low-density collection to suggest biloma. 3. Atelectasis and trace right pleural effusion. 4. Fibroid uterus. Electronically Signed   By: Marnee Spring M.D.   On: 12/25/2016 15:00    Anti-infectives: Anti-infectives    Start     Dose/Rate Route Frequency Ordered Stop   12/23/16 0200  cefTRIAXone (ROCEPHIN) 2 g in dextrose 5 % 50 mL IVPB  Status:  Discontinued     2 g 100 mL/hr over 30 Minutes Intravenous Every 24 hours 12/23/16 0148 12/24/16 1643       Avanti Jetter J 12/26/2016

## 2016-12-27 LAB — BASIC METABOLIC PANEL
Anion gap: 11 (ref 5–15)
BUN: 15 mg/dL (ref 6–20)
CO2: 24 mmol/L (ref 22–32)
Calcium: 8.4 mg/dL — ABNORMAL LOW (ref 8.9–10.3)
Chloride: 102 mmol/L (ref 101–111)
Creatinine, Ser: 0.94 mg/dL (ref 0.44–1.00)
GFR calc Af Amer: >60 mL/min (ref >60)
GFR calc non Af Amer: >60 mL/min (ref >60)
Glucose, Bld: 120 mg/dL — ABNORMAL HIGH (ref 65–99)
Potassium: 3.7 mmol/L (ref 3.5–5.1)
Sodium: 137 mmol/L (ref 135–145)

## 2016-12-27 LAB — GLUCOSE, CAPILLARY
Glucose-Capillary: 121 mg/dL — ABNORMAL HIGH (ref 65–99)
Glucose-Capillary: 126 mg/dL — ABNORMAL HIGH (ref 65–99)
Glucose-Capillary: 126 mg/dL — ABNORMAL HIGH (ref 65–99)
Glucose-Capillary: 135 mg/dL — ABNORMAL HIGH (ref 65–99)
Glucose-Capillary: 145 mg/dL — ABNORMAL HIGH (ref 65–99)
Glucose-Capillary: 159 mg/dL — ABNORMAL HIGH (ref 65–99)

## 2016-12-27 LAB — CBC
HCT: 27.5 % — ABNORMAL LOW (ref 36.0–46.0)
Hemoglobin: 9.2 g/dL — ABNORMAL LOW (ref 12.0–15.0)
MCH: 28.4 pg (ref 26.0–34.0)
MCHC: 33.5 g/dL (ref 30.0–36.0)
MCV: 84.9 fL (ref 78.0–100.0)
Platelets: 333 10*3/uL (ref 150–400)
RBC: 3.24 MIL/uL — ABNORMAL LOW (ref 3.87–5.11)
RDW: 14 % (ref 11.5–15.5)
WBC: 16.5 10*3/uL — ABNORMAL HIGH (ref 4.0–10.5)

## 2016-12-27 MED ORDER — AMLODIPINE BESYLATE 5 MG PO TABS
5.0000 mg | ORAL_TABLET | Freq: Every day | ORAL | Status: DC
  Administered 2016-12-27 – 2016-12-28 (×2): 5 mg via ORAL
  Filled 2016-12-27 (×2): qty 1

## 2016-12-27 NOTE — Progress Notes (Signed)
[facility]:  [phone] General Surgery Progress Note   LOS: 2 days  POD -  4 Days Post-Op  Chief Complaint: Abdominal pain  Assessment and Plan: 1.  LAPAROSCOPIC CHOLECYSTECTOMY - 12/23/2016  Post op hematoma in gall bladder bed - seen on CT scan 12/25/2016  WBC - 16,500 - 12/27/2016  Modest po intake - not taking enough to go home.  Will keep on full liquids.  She needs to move more and get in the hall and walk.  Also get IS.  2.  A fib 3.  Has been on Eliquis for heart  This is on hold for how. 4.  DVT prophylaxis - on hold 5.  Anemia  Hgb - 9.2 - 12/27/2016 5.  DM   Principal Problem:   Cholelithiasis with chronic cholecystitis Active Problems:   Biliary colic   Diabetes mellitus (HCC)  Subjective:  Modest po intake, though better than yesterday.  Objective:   Vitals:   12/26/16 2051 12/27/16 0540  BP: (!) 151/76 (!) 166/82  Pulse: 86 87  Resp: 20 20  Temp: 99.4 F (37.4 C) 98.7 F (37.1 C)  SpO2: 100% 99%     Intake/Output from previous day:  09/22 0701 - 09/23 0700 In: 610 [P.O.:610] Out: -   Intake/Output this shift:  No intake/output data recorded.   Physical Exam:   General: [name] who is alert and oriented.    HEENT: Normal. Pupils equal.   Heart:  RRR - does not appear to be in A Fib now .   Lungs: Clear   Abdomen: Soft.  Has BS.   Wound: Clean   Lab Results:    Recent Labs  12/26/16 0620 12/27/16 0604  WBC 17.0* 16.5*  HGB 9.0* 9.2*  HCT 26.5* 27.5*  PLT 286 333    BMET   Recent Labs  12/26/16 0620 12/27/16 0604  NA 137 137  K 4.0 3.7  CL 103 102  CO2 24 24  GLUCOSE 146* 120*  BUN 14 15  CREATININE 0.98 0.94  CALCIUM 8.5* 8.4*    PT/INR  No results for input(s): LABPROT, INR in the last 72 hours.  ABG  No results for input(s): PHART, HCO3 in the last 72 hours.  Invalid input(s): PCO2, PO2   Studies/Results:  Ct Abdomen Pelvis Wo Contrast  Result Date: 12/25/2016 CLINICAL DATA:  Nausea  and vomiting. Laparoscopic cholecystectomy 12/23/2016 EXAM: CT ABDOMEN AND PELVIS WITHOUT CONTRAST TECHNIQUE: Multidetector CT imaging of the abdomen and pelvis was performed following the standard protocol without IV contrast. COMPARISON:  06/17/2016 abdominal CT.  Ultrasound 12/23/2016 FINDINGS: Lower chest: Trace right pleural effusion. Lower lobe atelectasis. Minimal anterior pericardial fluid. Hepatobiliary: Gas within the right lobe of the liver could be pneumobilia from cholangiography, or pneumoperitoneum in deep fissures. No masslike findings. Cholecystectomy. There is moderate hemorrhage in the gallbladder fossa tracking in the interloop space and right pericolic gutter, with small volume hemorrhage in the pelvis. The hemorrhage in the gallbladder fossa region measures up to 6 cm in maximal dimension. Pneumoperitoneum, expected postoperatively. Pancreas: Unremarkable. Spleen: Unremarkable. Adrenals/Urinary Tract: Negative adrenals. No hydronephrosis or stone. Moderately distended bladder, otherwise unremarkable. Stomach/Bowel:  No obstruction. No appendicitis. Vascular/Lymphatic: No acute vascular abnormality. No mass or adenopathy. Reproductive:Fibroid uterus. Calcified fibroid on the right is the best seen and measures 3 cm. Other:   Nonspecific body wall edema. Musculoskeletal: No acute abnormalities. These results were called by telephone at the time of interpretation on 12/25/2016 at 2:59  pm to Dr. Feliciana Rossetti , who verbally acknowledged these results. IMPRESSION: 1. Moderate hemoperitoneum. The majority of clot is in the gallbladder fossa and pelvic recesses. 2. No low-density collection to suggest biloma. 3. Atelectasis and trace right pleural effusion. 4. Fibroid uterus. Electronically Signed   By: Marnee Spring M.D.   On: 12/25/2016 15:00     Anti-infectives:   Anti-infectives    Start     Dose/Rate Route Frequency Ordered Stop   12/23/16 0200  cefTRIAXone (ROCEPHIN) 2 g in dextrose 5 %  50 mL IVPB  Status:  Discontinued     2 g 100 mL/hr over 30 Minutes Intravenous Every 24 hours 12/23/16 0148 12/24/16 1643      Dawn Kin, MD, FACS Pager: [phone] [facility]: [phone] 12/27/2016

## 2016-12-27 NOTE — Progress Notes (Signed)
PROGRESS NOTE    Dawn Braun  ZOX:[id number] DOB: [birth year] DOA: 12/22/2016 PCP: Laurena Slimmer, MD   Brief Narrative: This is a 51 year old African-American female who presented with nausea, vomiting, abdominal pain and was noted to have cholelithiasis with cholecystitis for which she has undergone cholecystectomy on 9/19. She has developed some acute kidney injury since her operation for which we are consulted. Of note, she has atrial fibrillation on chronic anticoagulation with Eliquis and also is a type II diabetic. She had some persistent nausea with movement along with worsening anemia for which CT of the abdomen and pelvis was ordered with noted moderate hemoperitoneum.  This morning her lab work appears stable. Her creatinine has improved. She states that she is having less nausea but continues to have diffuse abdominal pain as before. Her blood pressures have been noted to be elevated.  Assessment & Plan:   Principal Problem:   Cholelithiasis with chronic cholecystitis Active Problems:   Biliary colic   Diabetes mellitus (HCC)   Nausea, vomiting, abdominal pain  Cholelithiasis s/p cholecystectomy 9/19 now with moderate hemoperitoneum noted on CT:   - continue antiemetics as ordered (zofran, compazine, phenergan) prn - EKG with QTc 459 on repeat - morphine for pain as ordered -Full liquid diet per primary team -Further recommendations per primary team with ambulation and IS noted  Acute Kidney Injury (1.67>>0.94, baseline .68) likely prerenal :  -AM renal panel to monitor; no IVF for now due to HTN and high fluid balance  Leukocytosis-improving; likely related to recent surgery: - daily CBC to monitor  Atrial fibrillation history; currently in sinus tachycardia: - continue flecainide and diltiazem - holding eliquis for now with hemoperitoneum; will need discussion with surgery on DC for when this could be resumed; likely will need repeat CT to ensure  stability before this could be considered -SCDs instead of heparin for DVT proph due to hemoperitoneum  Type 2 diabetes (A1c 6.3%)-good control noted:  -Continue with SSI for now and adjust if BG readings>250mg /dL  Hypertension-poor control -Hydralazine prn  -Amlodipine  PO daily added this am   DVT prophylaxis: SCDs Code Status: Full Family Communication: w/ patient Disposition Plan: Home in 1-2 days    Procedures: s/p cholecystectomy on 9/19   Antimicrobials: None  Objective: Vitals:   12/26/16 1000 12/26/16 1433 12/26/16 2051 12/27/16 0540  BP:  136/80 (!) 151/76 (!) 166/82  Pulse:  93 86 87  Resp:  Temp:  99.3 F (37.4 C) 99.4 F (37.4 C) 98.7 F (37.1 C)  TempSrc:  Oral Oral Oral  SpO2:  99% 100% 99%  Weight: 95 kg (209 lb 6.4 oz)     Height:  (1.6 m)       Intake/Output Summary (Last 24 hours) at 12/27/16 1155 Last data filed at 12/26/16 1800  Gross per 24 hour  Intake              510 ml  Output                0 ml  Net              510 ml   Filed Weights   12/26/16 1000  Weight: 95 kg (209 lb 6.4 oz)    Examination:  General exam: Appears calm and comfortable  Respiratory system: Clear to auscultation. Respiratory effort normal. Cardiovascular system: S1 & S2 heard, RRR. No JVD, murmurs, rubs, gallops or clicks. No pedal edema. Gastrointestinal system: Abdomen  is nondistended, soft, tender throughout all 4 quadrants. No organomegaly or masses felt. Normal bowel sounds heard. Central nervous system: Alert and oriented. No focal neurological deficits. Extremities: Symmetric 5 x 5 power. Skin: No rashes, lesions or ulcers Psychiatry: Judgement and insight appear normal. Mood & affect appropriate.     Data Reviewed: I have personally reviewed following labs and imaging studies  CBC:  Recent Labs Lab 12/22/16 2044 12/24/16 1438 12/25/16 0603 12/26/16 0620 12/27/16 0604  WBC 10.9* 21.9* 20.9* 17.0* 16.5*  HGB 13.3 10.4*  8.9* 9.0* 9.2*  HCT 39.0 30.1* 26.1* 26.5* 27.5*  MCV 84.2 83.4 84.2 84.9 84.9  PLT 371 285 296 286 333   Basic Metabolic Panel:  Recent Labs Lab 12/22/16 2044 12/24/16 1438 12/25/16 0603 12/26/16 0620 12/27/16 0604  NA 140 134* 136 137 137  K 3.6 4.3 4.0 4.0 3.7  CL 106 103 103 103 102  CO2 GLUCOSE 139* 232* 171* 146* 120*  BUN 15 25* 23* 14 15  CREATININE 0.72 1.67* 1.16* 0.98 0.94  CALCIUM 9.5 7.8* 7.7* 8.5* 8.4*   GFR: Estimated Creatinine Clearance: 76.7 mL/min (by C-G formula based on SCr of 0.94 mg/dL). Liver Function Tests:  Recent Labs Lab 12/22/16 2044 12/24/16 1438 12/25/16 0603 12/26/16 0620  AST 29 45* 59* 34  ALT 20 30 51 38  ALKPHOS 106 79 74 79  BILITOT 0.4 0.3 0.5 0.7  PROT 8.2* 6.5 6.0* 6.4*  ALBUMIN 4.1 3.2* 2.8* 2.9*    Recent Labs Lab 12/22/16 2044 12/24/16 1829  LIPASE 19 16   No results for input(s): AMMONIA in the last 168 hours. Coagulation Profile: No results for input(s): INR, PROTIME in the last 168 hours. Cardiac Enzymes: No results for input(s): CKTOTAL, CKMB, CKMBINDEX, TROPONINI in the last 168 hours. BNP (last 3 results) No results for input(s): PROBNP in the last 8760 hours. HbA1C:  Recent Labs  12/24/16 1438  HGBA1C 6.3*   CBG:  Recent Labs Lab 12/26/16 1656 12/26/16 2053 12/27/16 0041 12/27/16 0453 12/27/16 0729  GLUCAP 131* 124* 126* 126* 121*   Lipid Profile: No results for input(s): CHOL, HDL, LDLCALC, TRIG, CHOLHDL, LDLDIRECT in the last 72 hours. Thyroid Function Tests: No results for input(s): TSH, T4TOTAL, FREET4, T3FREE, THYROIDAB in the last 72 hours. Anemia Panel: No results for input(s): VITAMINB12, FOLATE, FERRITIN, TIBC, IRON, RETICCTPCT in the last 72 hours. Sepsis Labs:  Recent Labs Lab 12/24/16 1829 12/24/16 2057 12/24/16 2339  LATICACIDVEN 2.7* 2.3* 1.7    Recent Results (from the past 240 hour(s))  Surgical PCR screen     Status: None   Collection Time:  12/23/16  3:20 PM  Result Value Ref Range Status   MRSA, PCR NEGATIVE NEGATIVE Final   Staphylococcus aureus NEGATIVE NEGATIVE Final    Comment: (NOTE) The Xpert SA Assay (FDA approved for NASAL specimens in patients 8 years of age and older), is one component of a comprehensive surveillance program. It is not intended to diagnose infection nor to guide or monitor treatment.       Radiology Studies: Ct Abdomen Pelvis Wo Contrast  Result Date: 12/25/2016 CLINICAL DATA:  Nausea and vomiting. Laparoscopic cholecystectomy 12/23/2016 EXAM: CT ABDOMEN AND PELVIS WITHOUT CONTRAST TECHNIQUE: Multidetector CT imaging of the abdomen and pelvis was performed following the standard protocol without IV contrast. COMPARISON:  06/17/2016 abdominal CT.  Ultrasound 12/23/2016 FINDINGS: Lower chest: Trace right pleural effusion. Lower lobe atelectasis. Minimal anterior pericardial fluid. Hepatobiliary: Gas within the  right lobe of the liver could be pneumobilia from cholangiography, or pneumoperitoneum in deep fissures. No masslike findings. Cholecystectomy. There is moderate hemorrhage in the gallbladder fossa tracking in the interloop space and right pericolic gutter, with small volume hemorrhage in the pelvis. The hemorrhage in the gallbladder fossa region measures up to 6 cm in maximal dimension. Pneumoperitoneum, expected postoperatively. Pancreas: Unremarkable. Spleen: Unremarkable. Adrenals/Urinary Tract: Negative adrenals. No hydronephrosis or stone. Moderately distended bladder, otherwise unremarkable. Stomach/Bowel:  No obstruction. No appendicitis. Vascular/Lymphatic: No acute vascular abnormality. No mass or adenopathy. Reproductive:Fibroid uterus. Calcified fibroid on the right is the best seen and measures 3 cm. Other:   Nonspecific body wall edema. Musculoskeletal: No acute abnormalities. These results were called by telephone at the time of interpretation on 12/25/2016 at 2:59 pm to Dr. Feliciana Rossetti  , who verbally acknowledged these results. IMPRESSION: 1. Moderate hemoperitoneum. The majority of clot is in the gallbladder fossa and pelvic recesses. 2. No low-density collection to suggest biloma. 3. Atelectasis and trace right pleural effusion. 4. Fibroid uterus. Electronically Signed   By: Marnee Spring M.D.   On: 12/25/2016 15:00      Scheduled Meds: . amLODipine  5 mg Oral Daily  . flecainide  100 mg Oral BID  . insulin aspart  0-15 Units Subcutaneous Q4H  . metoprolol tartrate  25 mg Oral BID   Continuous Infusions:   LOS: 2 days   Time spent: 30 minutes    Mary Secord Hoover Brunette, MD [facility] Pager [phone]  If 7PM-7AM, please contact night-coverage [email or web address] Password [facility] 12/27/2016, 11:55 AM

## 2016-12-28 DIAGNOSIS — I10 Essential (primary) hypertension: Secondary | ICD-10-CM

## 2016-12-28 LAB — BASIC METABOLIC PANEL
Anion gap: 8 (ref 5–15)
BUN: 16 mg/dL (ref 6–20)
CO2: 22 mmol/L (ref 22–32)
Calcium: 8 mg/dL — ABNORMAL LOW (ref 8.9–10.3)
Chloride: 104 mmol/L (ref 101–111)
Creatinine, Ser: 1.01 mg/dL — ABNORMAL HIGH (ref 0.44–1.00)
GFR calc Af Amer: >60 mL/min (ref >60)
GFR calc non Af Amer: >60 mL/min (ref >60)
Glucose, Bld: 157 mg/dL — ABNORMAL HIGH (ref 65–99)
Potassium: 3.7 mmol/L (ref 3.5–5.1)
Sodium: 134 mmol/L — ABNORMAL LOW (ref 135–145)

## 2016-12-28 LAB — CBC
HCT: 26.7 % — ABNORMAL LOW (ref 36.0–46.0)
Hemoglobin: 9.1 g/dL — ABNORMAL LOW (ref 12.0–15.0)
MCH: 29.2 pg (ref 26.0–34.0)
MCHC: 34.1 g/dL (ref 30.0–36.0)
MCV: 85.6 fL (ref 78.0–100.0)
Platelets: 302 10*3/uL (ref 150–400)
RBC: 3.12 MIL/uL — ABNORMAL LOW (ref 3.87–5.11)
RDW: 14.2 % (ref 11.5–15.5)
WBC: 12.4 10*3/uL — ABNORMAL HIGH (ref 4.0–10.5)

## 2016-12-28 LAB — GLUCOSE, CAPILLARY
Glucose-Capillary: 174 mg/dL — ABNORMAL HIGH (ref 65–99)
Glucose-Capillary: 142 mg/dL — ABNORMAL HIGH (ref 65–99)
Glucose-Capillary: 151 mg/dL — ABNORMAL HIGH (ref 65–99)
Glucose-Capillary: 152 mg/dL — ABNORMAL HIGH (ref 65–99)
Glucose-Capillary: 144 mg/dL — ABNORMAL HIGH (ref 65–99)

## 2016-12-28 MED ORDER — INSULIN ASPART 100 UNIT/ML ~~LOC~~ SOLN
0.0000 [IU] | Freq: Three times a day (TID) | SUBCUTANEOUS | Status: DC
  Administered 2016-12-28 – 2016-12-29 (×3): 3 [IU] via SUBCUTANEOUS

## 2016-12-28 MED ORDER — DOCUSATE SODIUM 100 MG PO CAPS
100.0000 mg | ORAL_CAPSULE | Freq: Two times a day (BID) | ORAL | Status: DC
  Administered 2016-12-28 – 2016-12-29 (×3): 100 mg via ORAL
  Filled 2016-12-28 (×3): qty 1

## 2016-12-28 MED ORDER — ACETAMINOPHEN 500 MG PO TABS
1000.0000 mg | ORAL_TABLET | Freq: Three times a day (TID) | ORAL | Status: DC
  Administered 2016-12-28 – 2016-12-29 (×3): 1000 mg via ORAL
  Filled 2016-12-28 (×3): qty 2

## 2016-12-28 MED ORDER — ACETAMINOPHEN 325 MG PO TABS: 650.0000 mg | ORAL_TABLET | Freq: Four times a day (QID) | ORAL | Status: DC | PRN

## 2016-12-28 MED ORDER — AMLODIPINE BESYLATE 5 MG PO TABS
7.5000 mg | ORAL_TABLET | Freq: Every day | ORAL | Status: DC
  Administered 2016-12-29: 7.5 mg via ORAL
  Filled 2016-12-28: qty 2

## 2016-12-28 MED ORDER — OXYCODONE HCL 5 MG PO TABS: 5.0000 mg | ORAL_TABLET | ORAL | Status: DC | PRN

## 2016-12-28 NOTE — Progress Notes (Signed)
[facility] PROGRESS NOTE  Dawn Braun ZOX:[id number] DOB: December 18, 1965 DOA: 12/22/2016 PCP: Laurena Slimmer, MD  Brief Narrative: This is a 51 year old African-American female who presented with nausea, vomiting, abdominal pain and was noted to have cholelithiasis with cholecystitis for which she has undergone cholecystectomy on 9/19. She has developed some acute kidney injury since her operation for which we are consulted. Of note, she has atrial fibrillation on chronic anticoagulation with Eliquis and also is a type II diabetic. She had some persistent nausea with movement along with worsening anemia for which CT of the abdomen and pelvis was ordered with noted moderate hemoperitoneum.  This morning her lab work appears stable. Her creatinine has improved. She states that she is having less nausea but continues to have diffuse abdominal pain as before. Her blood pressures have been noted to be elevated.  Assessment & Plan:   Principal Problem:   Cholelithiasis with chronic cholecystitis Active Problems:   Biliary colic   Diabetes mellitus (HCC)   Nausea, vomiting, abdominal pain  Cholelithiasis s/p cholecystectomy 9/19 now with moderate hemoperitoneum noted on CT:  - continue antiemetics as ordered (zofran, compazine, phenergan) prn - EKG with QTc 459 on repeat - morphine for pain as ordered -diet per primary team -Further recommendations per primary team with ambulation and IS noted  Acute Kidney Injury (1.67>>0.94, baseline .68) likely prerenal : -AM renal panel to monitor; no IVF for now due to HTN and high fluid balance  Leukocytosis-improving; likely related to recent surgery: - daily CBC to monitor  Atrial fibrillation history; currently in sinus tachycardia: - continue flecainide,  -holding eliquis for now with hemoperitoneum; will need discussion with surgery on DC for when this could be resumed; likely will need repeat CT to ensure stability before  this could be considered -SCDs instead of heparin for DVT proph due to hemoperitoneum  Type 2 diabetes (A1c 6.3%)-good control noted:  -Continue with SSI for now and adjust if BG readings>250mg /dL  Hypertension-poor control -Hydralazine prn  -started on amlodipine  PO, increased to 7.5  Daily on 9/24. Cont BB. Titrate as needed   DVT prophylaxis: SCDs Code Status: Full Family Communication: w/ patient Disposition Plan: Home in 1-2 days    Procedures: s/p cholecystectomy on 9/19   Antimicrobials: None  HPI/Subjective: No distress. Asking to take a shower.   Objective: Vitals:   12/27/16 2145 12/28/16 0501  BP: (!) 156/83 (!) 161/77  Pulse: 82 78  Resp: 17 18  Temp: 99.6 F (37.6 C) 99.6 F (37.6 C)  SpO2: 100% 100%    Intake/Output Summary (Last 24 hours) at 12/28/16 1213 Last data filed at 12/28/16 0854  Gross per 24 hour  Intake              700 ml  Output                0 ml  Net              700 ml   Filed Weights   12/26/16 1000  Weight: 95 kg (209 lb 6.4 oz)    Exam:   General:  No distress   Cardiovascular: s1,s2 rrr  Respiratory: CTA BL   Abdomen: soft, nt  Musculoskeletal: no leg edema    Data Reviewed: Basic Metabolic Panel:  Recent Labs Lab 12/24/16 1438 12/25/16 0603 12/26/16 0620 12/27/16 0604 12/28/16 0823  NA 134* 136 137 137 134*  K 4.3 4.0 4.0 3.7 3.7  CL 103 103 103 102  104  CO2 GLUCOSE 232* 171* 146* 120* 157*  BUN 25* 23* CREATININE 1.67* 1.16* 0.98 0.94 1.01*  CALCIUM 7.8* 7.7* 8.5* 8.4* 8.0*   Liver Function Tests:  Recent Labs Lab 12/22/16 2044 12/24/16 1438 12/25/16 0603 12/26/16 0620  AST 29 45* 59* 34  ALT 20 30 51 38  ALKPHOS 106 79 74 79  BILITOT 0.4 0.3 0.5 0.7  PROT 8.2* 6.5 6.0* 6.4*  ALBUMIN 4.1 3.2* 2.8* 2.9*    Recent Labs Lab 12/22/16 2044 12/24/16 1829  LIPASE 19 16   No results for input(s): AMMONIA in the last 168 hours. CBC:  Recent  Labs Lab 12/24/16 1438 12/25/16 0603 12/26/16 0620 12/27/16 0604 12/28/16 0823  WBC 21.9* 20.9* 17.0* 16.5* 12.4*  HGB 10.4* 8.9* 9.0* 9.2* 9.1*  HCT 30.1* 26.1* 26.5* 27.5* 26.7*  MCV 83.4 84.2 84.9 84.9 85.6  PLT 285 296 286 333 302   Cardiac Enzymes: No results for input(s): CKTOTAL, CKMB, CKMBINDEX, TROPONINI in the last 168 hours. BNP (last 3 results)  Recent Labs  01/03/16 1125  BNP 42.2    ProBNP (last 3 results) No results for input(s): PROBNP in the last 8760 hours.  CBG:  Recent Labs Lab 12/27/16 1612 12/27/16 2143 12/28/16 0434 12/28/16 0740 12/28/16 1138  GLUCAP 145* 159* 174* 142* 151*    Recent Results (from the past 240 hour(s))  Surgical PCR screen     Status: None   Collection Time: 12/23/16  3:20 PM  Result Value Ref Range Status   MRSA, PCR NEGATIVE NEGATIVE Final   Staphylococcus aureus NEGATIVE NEGATIVE Final    Comment: (NOTE) The Xpert SA Assay (FDA approved for NASAL specimens in patients 31 years of age and older), is one component of a comprehensive surveillance program. It is not intended to diagnose infection nor to guide or monitor treatment.      Studies: No results found.  Scheduled Meds: . amLODipine  5 mg Oral Daily  . docusate sodium  100 mg Oral BID  . flecainide  100 mg Oral BID  . insulin aspart  0-15 Units Subcutaneous TID WC  . metoprolol tartrate  25 mg Oral BID   Continuous Infusions:  Principal Problem:   Cholelithiasis with chronic cholecystitis Active Problems:   Biliary colic   Diabetes mellitus (HCC)    Time spent: >35 minutes     Esperanza Sheets  [facility] Pager [phone]. If 7PM-7AM, please contact night-coverage at [email or web address], password [facility] 12/28/2016, 12:13 PM  LOS: 3 days

## 2016-12-28 NOTE — Progress Notes (Signed)
[facility]:  [phone] General Surgery Progress Note   LOS: 3 days  POD -  5 Days Post-Op  Chief Complaint: Abdominal pain  Assessment and Plan: 1.  LAPAROSCOPIC CHOLECYSTECTOMY - 12/23/2016  Post op hematoma in gall bladder bed - seen on CT scan 12/25/2016  WBC - 16,500 - 12/27/2016  She finished her breakfast tray. Will advance to carb counting diet.  She needs to move more and get in the hall and walk.  Also get IS.  2.  A fib 3.  Has been on Eliquis for heart  This is on hold for how. 4.  DVT prophylaxis - on hold 5.  Anemia  Hgb - 9.2 - 12/27/2016 5.  DM   Principal Problem:   Cholelithiasis with chronic cholecystitis Active Problems:   Biliary colic   Diabetes mellitus (HCC)  Subjective:  PO intake and nausea improving. .  Objective:   Vitals:   12/27/16 2145 12/28/16 0501  BP: (!) 156/83 (!) 161/77  Pulse: 82 78  Resp: 17 18  Temp: 99.6 F (37.6 C) 99.6 F (37.6 C)  SpO2: 100% 100%     Intake/Output from previous day:  09/23 0701 - 09/24 0700 In: 560 [P.O.:560] Out: -   Intake/Output this shift:  No intake/output data recorded.   Physical Exam:   General: [name] who is alert and oriented.    HEENT: Normal. Pupils equal.   Heart:  RRR - does not appear to be in A Fib now .   Lungs: Clear   Abdomen: Soft. Tender centrally.  Has BS.   Wound: Clean   Lab Results:     Recent Labs  12/26/16 0620 12/27/16 0604  WBC 17.0* 16.5*  HGB 9.0* 9.2*  HCT 26.5* 27.5*  PLT 286 333    BMET    Recent Labs  12/26/16 0620 12/27/16 0604  NA 137 137  K 4.0 3.7  CL 103 102  CO2 24 24  GLUCOSE 146* 120*  BUN 14 15  CREATININE 0.98 0.94  CALCIUM 8.5* 8.4*    PT/INR  No results for input(s): LABPROT, INR in the last 72 hours.  ABG  No results for input(s): PHART, HCO3 in the last 72 hours.  Invalid input(s): PCO2, PO2   Studies/Results:  No results found.   Anti-infectives:   Anti-infectives    Start      Dose/Rate Route Frequency Ordered Stop   12/23/16 0200  cefTRIAXone (ROCEPHIN) 2 g in dextrose 5 % 50 mL IVPB  Status:  Discontinued     2 g 100 mL/hr over 30 Minutes Intravenous Every 24 hours 12/23/16 0148 12/24/16 1643      Berna Bue MD [facility] Office: [phone] 12/28/2016

## 2016-12-28 NOTE — Care Management Note (Signed)
Case Management Note  Patient Details  Name: SHUKRI NISTLER MRN: [id number] Date of Birth: [birth year]  Subjective/Objective:                   LAPAROSCOPIC CHOLECYSTECTOMY - 12/23/2016             Post op hematoma in gall bladder bed - seen on CT scan 12/25/2016             WBC - 16,500 - 12/27/2016             She finished her breakfast tray. Will advance to carb counting diet.  She needs to move more and get in the hall and walk.  Also get IS.  2.  A fib 3.  Has been on Eliquis for heart             This is on hold for how. 4.  DVT prophylaxis - on hold 5.  Anemia             Hgb - 9.2 - 12/27/2016 5.  DM  Action/Plan: Date:  December 28, 2016 Chart reviewed for concurrent status and case management needs.  Will continue to follow patient progress.  Discharge Planning: following for needs  Expected discharge date: [id number]  Marcelle Smiling, BSN, [place], [place]   [phone]    Expected Discharge Date:                  Expected Discharge Plan:  Home/Self Care  In-House Referral:     Discharge planning Services  CM Consult  Post Acute Care Choice:    Choice offered to:     DME Arranged:    DME Agency:     HH Arranged:    HH Agency:     Status of Service:  In process, will continue to follow  If discussed at Long Length of Stay Meetings, dates discussed:    Additional Comments:  Golda Acre, RN 12/28/2016, 10:17 AM

## 2016-12-29 LAB — CBC
HCT: 27.1 % — ABNORMAL LOW (ref 36.0–46.0)
Hemoglobin: 9.2 g/dL — ABNORMAL LOW (ref 12.0–15.0)
MCH: 28.9 pg (ref 26.0–34.0)
MCHC: 33.9 g/dL (ref 30.0–36.0)
MCV: 85.2 fL (ref 78.0–100.0)
Platelets: 355 10*3/uL (ref 150–400)
RBC: 3.18 MIL/uL — ABNORMAL LOW (ref 3.87–5.11)
RDW: 14 % (ref 11.5–15.5)
WBC: 13.2 10*3/uL — ABNORMAL HIGH (ref 4.0–10.5)

## 2016-12-29 LAB — BASIC METABOLIC PANEL
Anion gap: 10 (ref 5–15)
BUN: 14 mg/dL (ref 6–20)
CO2: 23 mmol/L (ref 22–32)
Calcium: 8.2 mg/dL — ABNORMAL LOW (ref 8.9–10.3)
Chloride: 104 mmol/L (ref 101–111)
Creatinine, Ser: 0.93 mg/dL (ref 0.44–1.00)
GFR calc Af Amer: >60 mL/min (ref >60)
GFR calc non Af Amer: >60 mL/min (ref >60)
Glucose, Bld: 185 mg/dL — ABNORMAL HIGH (ref 65–99)
Potassium: 3.7 mmol/L (ref 3.5–5.1)
Sodium: 137 mmol/L (ref 135–145)

## 2016-12-29 LAB — GLUCOSE, CAPILLARY: Glucose-Capillary: 178 mg/dL — ABNORMAL HIGH (ref 65–99)

## 2016-12-29 MED ORDER — DOCUSATE SODIUM 100 MG PO CAPS: 100.0000 mg | ORAL_CAPSULE | Freq: Two times a day (BID) | ORAL | 0 refills | Status: DC

## 2016-12-29 MED ORDER — OXYCODONE HCL 5 MG PO TABS: 5.0000 mg | ORAL_TABLET | ORAL | 0 refills | Status: DC | PRN

## 2016-12-29 MED ORDER — METOPROLOL TARTRATE 25 MG PO TABS: 25.0000 mg | ORAL_TABLET | Freq: Two times a day (BID) | ORAL | 0 refills | Status: DC

## 2016-12-29 NOTE — Discharge Instructions (Signed)
[facility] ______[facility] LAPAROSCOPIC SURGERY: POST OP INSTRUCTIONS Always review your discharge instruction sheet given to you by the facility where your surgery was performed. IF YOU HAVE DISABILITY OR FAMILY LEAVE FORMS, YOU MUST BRING THEM TO THE OFFICE FOR PROCESSING.   DO NOT GIVE THEM TO YOUR DOCTOR.  1. A prescription for pain medication may be given to you upon discharge.  Take your pain medication as prescribed, if needed.  If narcotic pain medicine is not needed, then you may take acetaminophen (Tylenol) or ibuprofen (Advil) as needed. 2. Take your usually prescribed medications unless otherwise directed. 3. If you need a refill on your pain medication, please contact your pharmacy.  They will contact our office to request authorization. Prescriptions will not be filled after 5pm or on week-ends. 4. You should follow a light diet the first few days after arrival home, such as soup and crackers, etc.  Be sure to include lots of fluids daily. 5. Most patients will experience some swelling and bruising in the area of the incisions.  Ice packs will help.  Swelling and bruising can take several days to resolve.  6. It is common to experience some constipation if taking pain medication after surgery.  Increasing fluid intake and taking a stool softener (such as Colace) will usually help or prevent this problem from occurring.  A mild laxative (Milk of Magnesia or Miralax) should be taken according to package instructions if there are no bowel movements after 48 hours. 7. Unless discharge instructions indicate otherwise, you may remove your bandages 24-48 hours after surgery, and you may shower at that time.  You may have steri-strips (small skin tapes) in place directly over the incision.  These strips should be left on the skin for 7-10 days.  If your surgeon used skin glue on the incision, you may shower in 24 hours.  The glue will flake off over the next 2-3 weeks.  Any sutures or  staples will be removed at the office during your follow-up visit. 8. ACTIVITIES:  You may resume regular (light) daily activities beginning the next day--such as daily self-care, walking, climbing stairs--gradually increasing activities as tolerated.  You may have sexual intercourse when it is comfortable.  Refrain from any heavy lifting or straining until approved by your doctor. a. You may drive when you are no longer taking prescription pain medication, you can comfortably wear a seatbelt, and you can safely maneuver your car and apply brakes. b. RETURN TO WORK:  ____One week after discharge______________________________________________________ 9. You should see your doctor in the office for a follow-up appointment approximately 2-3 weeks after your surgery.  Make sure that you call for this appointment within a day or two after you arrive home to insure a convenient appointment time. 10. OTHER INSTRUCTIONS: __________________________________________________________________________________________________________________________ __________________________________________________________________________________________________________________________ WHEN TO CALL YOUR DOCTOR: 1. Fever over 101.0 2. Inability to urinate 3. Continued bleeding from incision. 4. Increased pain, redness, or drainage from the incision. 5. Increasing abdominal pain  The clinic staff is available to answer your questions during regular business hours.  Please dont hesitate to call and ask to speak to one of the nurses for clinical concerns.  If you have a medical emergency, go to the nearest emergency room or call 911.  A surgeon from [facility] is always on call at the hospital. [address], [place], [place]  [address] ? [address] [address], [place], [place]   [address] [phone] ? [phone] ? [phone] Web site: [email or web address]   Laparoscopic Cholecystectomy,  Care After This sheet gives you information about how to care for yourself after your procedure. Your health care provider may also give you more specific instructions. If you have problems or questions, contact your health care provider. What can I expect after the procedure? After the procedure, it is common to have:  Pain at your incision sites. You will be given medicines to control this pain.  Mild nausea or vomiting.  Bloating and possible shoulder pain from the air-like gas that was used during the procedure.  Follow these instructions at home: Incision care   Follow instructions from your health care provider about how to take care of your incisions. Make sure you: ? Wash your hands with soap and water before you change your bandage (dressing). If soap and water are not available, use hand sanitizer. ? Change your dressing as told by your health care provider. ? Leave stitches (sutures), skin glue, or adhesive strips in place. These skin closures may need to be in place for 2 weeks or longer. If adhesive strip edges start to loosen and curl up, you may trim the loose edges. Do not remove adhesive strips completely unless your health care provider tells you to do that.  Do not take baths, swim, or use a hot tub until your health care provider approves. Ask your health care provider if you can take showers. You may only be allowed to take sponge baths for bathing.  Check your incision area every day for signs of infection. Check for: ? More redness, swelling, or pain. ? More fluid or blood. ? Warmth. ? Pus or a bad smell. Activity  Do not drive or use heavy machinery while taking prescription pain medicine.  Do not lift anything that is heavier than 10 lb (4.5 kg) until your health care provider approves.  Do not play contact sports until your health care provider approves.  Do not drive for 24 hours if you were given a medicine to help you relax (sedative).  Rest as  needed. Do not return to work or school until your health care provider approves. General instructions  Take over-the-counter and prescription medicines only as told by your health care provider.  To prevent or treat constipation while you are taking prescription pain medicine, your health care provider may recommend that you: ? Drink enough fluid to keep your urine clear or pale yellow. ? Take over-the-counter or prescription medicines. ? Eat foods that are high in fiber, such as fresh fruits and vegetables, whole grains, and beans. ? Limit foods that are high in fat and processed sugars, such as fried and sweet foods. Contact a health care provider if:  You develop a rash.  You have more redness, swelling, or pain around your incisions.  You have more fluid or blood coming from your incisions.  Your incisions feel warm to the touch.  You have pus or a bad smell coming from your incisions.  You have a fever.  One or more of your incisions breaks open. Get help right away if:  You have trouble breathing.  You have chest pain.  You have increasing pain in your shoulders.  You faint or feel dizzy when you stand.  You have severe pain in your abdomen.  You have nausea or vomiting that lasts for more than one day.  You have leg pain. This information is not intended to replace advice given to you by your health care provider.  Make sure you discuss any questions you have with your health care provider. Document Released: 03/23/2005 Document Revised: 10/12/2015 Document Reviewed: 09/09/2015 Elsevier Interactive Patient Education  2017 [employer].

## 2016-12-29 NOTE — Progress Notes (Signed)
December 29, 2016 Chart and discharge orders researched for Case Management needs. None found and patient discharged to appropriate level of care. Patient and family have no further questions. Rhonda Davis, BSN, RN3, CCM [phone]. 

## 2016-12-29 NOTE — Discharge Summary (Signed)
Physician Discharge Summary  Patient ID: Dawn Braun MRN: [id number] DOB/AGE: 52-May-1967 52 y.o.  Admit date: 12/22/2016 Discharge date: 12/29/2016  Admission Diagnoses:  Discharge Diagnoses:  Active Problems:   Diabetes mellitus [facility])   Discharged Condition: good  Hospital Course: Underwent lap chole 9/19. Post op course c/b nausea, hematoma in gallbladder bed. Medicine was consulted for management of diabetes and hypertension, AKI.   Consults: internal medicine  Significant Diagnostic Studies: see epic  Treatments: surgery: laparoscopic cholecystectomy  Discharge Exam: Blood pressure 133/74, pulse 75, temperature 98.5 F (36.9 C), temperature source Oral, resp. rate 18, height  (1.6 m), weight 95 kg (209 lb 6.4 oz), SpO2 96 %. General appearance: alert and cooperative Resp: clear to auscultation bilaterally Gsoft, nontendersoft, nontender Incision/Wound: c/d/i with dermabond  Disposition: 01-Home or Self Care  Discharge Instructions    Diet - low sodium heart healthy    Complete by:  As directed    Increase activity slowly    Complete by:  As directed      Allergies as of 12/29/2016   No Known Allergies     Medication List    STOP taking these medications   albuterol (2.5 MG/3ML) 0.083% nebulizer solution Commonly known as:  PROVENTIL   cephALEXin 500 MG capsule Commonly known as:  KEFLEX   HYDROcodone-acetaminophen 5-325 MG tablet Commonly known as:  NORCO/VICODIN   ondansetron 4 MG tablet Commonly known as:  ZOFRAN     TAKE these medications   apixaban 5 MG Tabs tablet Commonly known as:  ELIQUIS Take 1 tablet (5 mg total) by mouth 2 (two) times daily.   CARTIA XT 120 MG 24 hr capsule Generic drug:  diltiazem TAKE 1 CAPSULE BY MOUTH ONCE DAILY. NEED APPT FOR FURTHER REFILLS   diltiazem 30 MG tablet Commonly known as:  CARDIZEM Cardizem  -- take 1 tablet by mouth every 4 hours AS NEEDED for heart rate >100 as long as blood  pressure >100.   docusate sodium 100 MG capsule Commonly known as:  COLACE Take 1 capsule (100 mg total) by mouth 2 (two) times daily.   flecainide 100 MG tablet Commonly known as:  TAMBOCOR Take 1 tablet (100 mg total) by mouth 2 (two) times daily.   metoprolol tartrate 25 MG tablet Commonly known as:  LOPRESSOR Take 1 tablet (25 mg total) by mouth 2 (two) times daily.   oxyCODONE 5 MG immediate release tablet Commonly known as:  Oxy IR/ROXICODONE Take 1 tablet (5 mg total) by mouth every 4 (four) hours as needed for severe pain or breakthrough pain.   VICTOZA 18 MG/3ML Sopn Generic drug:  liraglutide Inject 1.8 mg into the skin daily.            Discharge Care Instructions        Start     Ordered   12/29/16 0000  docusate sodium (COLACE) 100 MG capsule  2 times daily     12/29/16 0936   12/29/16 0000  metoprolol tartrate (LOPRESSOR) 25 MG tablet  2 times daily     12/29/16 0936   12/29/16 0000  oxyCODONE (OXY IR/ROXICODONE) 5 MG immediate release tablet  Every 4 hours PRN     12/29/16 0936   12/29/16 0000  Diet - low sodium heart healthy     12/29/16 0936   12/29/16 0000  Increase activity slowly     12/29/16 0936     Follow-up Information    [facility] up on 01/07/2017.  Specialty:  General Surgery Why:  Your appointment is at 3 PM.. Be at the office 30 minutes early for check-in. Bring Photo ID and insurance information. Contact information: [address] [place] [address] [phone]        [facility].   Specialty:  General Surgery Contact information: [address] [place] [address] [phone]           Signed: Berna Bue 12/29/2016, 9:37 AM

## 2016-12-31 ENCOUNTER — Encounter (HOSPITAL_COMMUNITY): Payer: Self-pay

## 2016-12-31 ENCOUNTER — Observation Stay (HOSPITAL_COMMUNITY)
Admission: EM | Admit: 2016-12-31 | Discharge: 2017-01-03 | DRG: 199 | Disposition: A | Discharge status: Home / Self Care | Payer: 59 | Attending: Internal Medicine | Admitting: Internal Medicine

## 2016-12-31 ENCOUNTER — Emergency Department (HOSPITAL_COMMUNITY): Payer: 59

## 2016-12-31 DIAGNOSIS — I951 Orthostatic hypotension: Secondary | ICD-10-CM | POA: Diagnosis present

## 2016-12-31 DIAGNOSIS — R824 Acetonuria: Secondary | ICD-10-CM

## 2016-12-31 DIAGNOSIS — E119 Type 2 diabetes mellitus without complications: Secondary | ICD-10-CM | POA: Diagnosis not present

## 2016-12-31 DIAGNOSIS — Z9049 Acquired absence of other specified parts of digestive tract: Secondary | ICD-10-CM | POA: Diagnosis not present

## 2016-12-31 DIAGNOSIS — R112 Nausea with vomiting, unspecified: Secondary | ICD-10-CM | POA: Diagnosis not present

## 2016-12-31 DIAGNOSIS — E876 Hypokalemia: Secondary | ICD-10-CM

## 2016-12-31 DIAGNOSIS — E118 Type 2 diabetes mellitus with unspecified complications: Secondary | ICD-10-CM | POA: Diagnosis not present

## 2016-12-31 DIAGNOSIS — K661 Hemoperitoneum: Secondary | ICD-10-CM | POA: Diagnosis not present

## 2016-12-31 DIAGNOSIS — J45909 Unspecified asthma, uncomplicated: Secondary | ICD-10-CM | POA: Diagnosis present

## 2016-12-31 DIAGNOSIS — Z7901 Long term (current) use of anticoagulants: Secondary | ICD-10-CM

## 2016-12-31 DIAGNOSIS — K9189 Other postprocedural complications and disorders of digestive system: Secondary | ICD-10-CM | POA: Diagnosis not present

## 2016-12-31 DIAGNOSIS — A419 Sepsis, unspecified organism: Secondary | ICD-10-CM

## 2016-12-31 DIAGNOSIS — Z7984 Long term (current) use of oral hypoglycemic drugs: Secondary | ICD-10-CM | POA: Diagnosis not present

## 2016-12-31 DIAGNOSIS — Y838 Other surgical procedures as the cause of abnormal reaction of the patient, or of later complication, without mention of misadventure at the time of the procedure: Secondary | ICD-10-CM | POA: Diagnosis not present

## 2016-12-31 DIAGNOSIS — N179 Acute kidney failure, unspecified: Secondary | ICD-10-CM | POA: Diagnosis not present

## 2016-12-31 DIAGNOSIS — E860 Dehydration: Secondary | ICD-10-CM | POA: Diagnosis not present

## 2016-12-31 DIAGNOSIS — R6510 Systemic inflammatory response syndrome (SIRS) of non-infectious origin without acute organ dysfunction: Secondary | ICD-10-CM | POA: Diagnosis present

## 2016-12-31 DIAGNOSIS — Z79899 Other long term (current) drug therapy: Secondary | ICD-10-CM | POA: Diagnosis not present

## 2016-12-31 DIAGNOSIS — D72829 Elevated white blood cell count, unspecified: Secondary | ICD-10-CM | POA: Diagnosis not present

## 2016-12-31 DIAGNOSIS — R42 Dizziness and giddiness: Secondary | ICD-10-CM

## 2016-12-31 DIAGNOSIS — T797XXA Traumatic subcutaneous emphysema, initial encounter: Principal | ICD-10-CM | POA: Diagnosis present

## 2016-12-31 DIAGNOSIS — K5900 Constipation, unspecified: Secondary | ICD-10-CM | POA: Diagnosis not present

## 2016-12-31 DIAGNOSIS — E669 Obesity, unspecified: Secondary | ICD-10-CM

## 2016-12-31 DIAGNOSIS — I480 Paroxysmal atrial fibrillation: Secondary | ICD-10-CM | POA: Diagnosis present

## 2016-12-31 DIAGNOSIS — K567 Ileus, unspecified: Secondary | ICD-10-CM | POA: Diagnosis present

## 2016-12-31 DIAGNOSIS — E1169 Type 2 diabetes mellitus with other specified complication: Secondary | ICD-10-CM

## 2016-12-31 LAB — URINALYSIS, ROUTINE W REFLEX MICROSCOPIC
Cellular Cast, UA: 3
Glucose, UA: NEGATIVE mg/dL
Ketones, ur: 80 mg/dL — AB
Leukocytes, UA: NEGATIVE
Nitrite: NEGATIVE
Protein, ur: >=300 mg/dL — AB
Specific Gravity, Urine: 1.026 (ref 1.005–1.030)
pH: 5 (ref 5.0–8.0)

## 2016-12-31 LAB — COMPREHENSIVE METABOLIC PANEL
ALT: 23 U/L (ref 14–54)
AST: 30 U/L (ref 15–41)
Albumin: 3.4 g/dL — ABNORMAL LOW (ref 3.5–5.0)
Alkaline Phosphatase: 96 U/L (ref 38–126)
Anion gap: 14 (ref 5–15)
BUN: 23 mg/dL — ABNORMAL HIGH (ref 6–20)
CO2: 22 mmol/L (ref 22–32)
Calcium: 8.7 mg/dL — ABNORMAL LOW (ref 8.9–10.3)
Chloride: 104 mmol/L (ref 101–111)
Creatinine, Ser: 1.12 mg/dL — ABNORMAL HIGH (ref 0.44–1.00)
GFR calc Af Amer: >60 mL/min (ref >60)
GFR calc non Af Amer: 55 mL/min — ABNORMAL LOW (ref >60)
Glucose, Bld: 130 mg/dL — ABNORMAL HIGH (ref 65–99)
Potassium: 3.7 mmol/L (ref 3.5–5.1)
Sodium: 140 mmol/L (ref 135–145)
Total Bilirubin: 1.2 mg/dL (ref 0.3–1.2)
Total Protein: 7.2 g/dL (ref 6.5–8.1)

## 2016-12-31 LAB — CBC
HCT: 29.7 % — ABNORMAL LOW (ref 36.0–46.0)
Hemoglobin: 10.1 g/dL — ABNORMAL LOW (ref 12.0–15.0)
MCH: 28.5 pg (ref 26.0–34.0)
MCHC: 34 g/dL (ref 30.0–36.0)
MCV: 83.7 fL (ref 78.0–100.0)
Platelets: 462 10*3/uL — ABNORMAL HIGH (ref 150–400)
RBC: 3.55 MIL/uL — ABNORMAL LOW (ref 3.87–5.11)
RDW: 14.3 % (ref 11.5–15.5)
WBC: 14.5 10*3/uL — ABNORMAL HIGH (ref 4.0–10.5)

## 2016-12-31 LAB — LACTIC ACID, PLASMA: Lactic Acid, Venous: 1.2 mmol/L (ref 0.5–1.9)

## 2016-12-31 LAB — CBG MONITORING, ED: Glucose-Capillary: 118 mg/dL — ABNORMAL HIGH (ref 65–99)

## 2016-12-31 LAB — LIPASE, BLOOD: Lipase: 28 U/L (ref 11–51)

## 2016-12-31 LAB — PROTIME-INR
INR: 1.1
Prothrombin Time: 14.1 seconds (ref 11.4–15.2)

## 2016-12-31 LAB — APTT: aPTT: 27 seconds (ref 24–36)

## 2016-12-31 LAB — PROCALCITONIN: Procalcitonin: <0.1 ng/mL

## 2016-12-31 MED ORDER — IOPAMIDOL (ISOVUE-300) INJECTION 61%
100.0000 mL | Freq: Once | INTRAVENOUS | Status: AC | PRN
  Administered 2016-12-31: 100 mL via INTRAVENOUS

## 2016-12-31 MED ORDER — ZOLPIDEM TARTRATE 5 MG PO TABS: 5.0000 mg | ORAL_TABLET | Freq: Every evening | ORAL | Status: DC | PRN

## 2016-12-31 MED ORDER — INSULIN ASPART 100 UNIT/ML ~~LOC~~ SOLN
0.0000 [IU] | Freq: Three times a day (TID) | SUBCUTANEOUS | Status: DC
  Administered 2017-01-01 (×2): 1 [IU] via SUBCUTANEOUS
  Administered 2017-01-02 (×2): 2 [IU] via SUBCUTANEOUS
  Administered 2017-01-03: 1 [IU] via SUBCUTANEOUS

## 2016-12-31 MED ORDER — FAMOTIDINE IN NACL 20-0.9 MG/50ML-% IV SOLN
20.0000 mg | Freq: Two times a day (BID) | INTRAVENOUS | Status: DC
  Administered 2017-01-01 – 2017-01-02 (×5): 20 mg via INTRAVENOUS
  Filled 2016-12-31 (×6): qty 50

## 2016-12-31 MED ORDER — ACETAMINOPHEN 325 MG PO TABS: 650.0000 mg | ORAL_TABLET | Freq: Four times a day (QID) | ORAL | Status: DC | PRN

## 2016-12-31 MED ORDER — METOCLOPRAMIDE HCL 5 MG/ML IJ SOLN
10.0000 mg | INTRAMUSCULAR | Status: AC
  Administered 2016-12-31: 10 mg via INTRAVENOUS
  Filled 2016-12-31: qty 2

## 2016-12-31 MED ORDER — ONDANSETRON HCL 4 MG/2ML IJ SOLN
4.0000 mg | Freq: Four times a day (QID) | INTRAMUSCULAR | Status: DC | PRN
  Administered 2016-12-31: 4 mg via INTRAVENOUS
  Filled 2016-12-31: qty 2

## 2016-12-31 MED ORDER — ONDANSETRON 4 MG PO TBDP: 4.0000 mg | ORAL_TABLET | Freq: Once | ORAL | Status: DC | PRN

## 2016-12-31 MED ORDER — ALBUTEROL SULFATE (2.5 MG/3ML) 0.083% IN NEBU: 2.5000 mg | INHALATION_SOLUTION | RESPIRATORY_TRACT | Status: DC | PRN

## 2016-12-31 MED ORDER — IOPAMIDOL (ISOVUE-300) INJECTION 61%
INTRAVENOUS | Status: AC
  Filled 2016-12-31: qty 100

## 2016-12-31 MED ORDER — INSULIN ASPART 100 UNIT/ML ~~LOC~~ SOLN
0.0000 [IU] | Freq: Every day | SUBCUTANEOUS | Status: DC
  Administered 2017-01-02: 2 [IU] via SUBCUTANEOUS

## 2016-12-31 MED ORDER — POLYETHYLENE GLYCOL 3350 17 G PO PACK: 17.0000 g | PACK | Freq: Every day | ORAL | Status: DC | PRN

## 2016-12-31 MED ORDER — OXYCODONE HCL 5 MG PO TABS: 5.0000 mg | ORAL_TABLET | ORAL | Status: DC | PRN

## 2016-12-31 MED ORDER — DILTIAZEM HCL 30 MG PO TABS: 30.0000 mg | ORAL_TABLET | Freq: Four times a day (QID) | ORAL | Status: DC | PRN

## 2016-12-31 MED ORDER — HYDRALAZINE HCL 20 MG/ML IJ SOLN: 5.0000 mg | INTRAMUSCULAR | Status: DC | PRN

## 2016-12-31 MED ORDER — DOCUSATE SODIUM 100 MG PO CAPS
100.0000 mg | ORAL_CAPSULE | Freq: Two times a day (BID) | ORAL | Status: DC
  Administered 2017-01-01 (×2): 100 mg via ORAL
  Filled 2016-12-31 (×5): qty 1

## 2016-12-31 MED ORDER — FLECAINIDE ACETATE 100 MG PO TABS
100.0000 mg | ORAL_TABLET | Freq: Two times a day (BID) | ORAL | Status: DC
  Administered 2017-01-01 – 2017-01-03 (×5): 100 mg via ORAL
  Filled 2016-12-31 (×7): qty 1

## 2016-12-31 MED ORDER — LACTATED RINGERS IV BOLUS (SEPSIS)
1000.0000 mL | Freq: Once | INTRAVENOUS | Status: AC
  Administered 2016-12-31: 1000 mL via INTRAVENOUS

## 2016-12-31 MED ORDER — METOPROLOL TARTRATE 25 MG PO TABS
25.0000 mg | ORAL_TABLET | Freq: Two times a day (BID) | ORAL | Status: DC
  Administered 2017-01-01 (×3): 25 mg via ORAL
  Filled 2016-12-31 (×5): qty 1

## 2016-12-31 MED ORDER — SODIUM CHLORIDE 0.9 % IV SOLN
Freq: Once | INTRAVENOUS | Status: AC
  Administered 2016-12-31: 19:00:00 via INTRAVENOUS

## 2016-12-31 MED ORDER — SODIUM CHLORIDE 0.9 % IV SOLN
INTRAVENOUS | Status: DC
  Administered 2016-12-31 – 2017-01-02 (×4): via INTRAVENOUS

## 2016-12-31 MED ORDER — SODIUM CHLORIDE 0.9 % IV BOLUS (SEPSIS)
2000.0000 mL | Freq: Once | INTRAVENOUS | Status: AC
  Administered 2016-12-31: 2000 mL via INTRAVENOUS

## 2016-12-31 MED ORDER — ACETAMINOPHEN 650 MG RE SUPP: 650.0000 mg | Freq: Four times a day (QID) | RECTAL | Status: DC | PRN

## 2016-12-31 MED ORDER — METOPROLOL TARTRATE 5 MG/5ML IV SOLN: 2.5000 mg | INTRAVENOUS | Status: DC | PRN

## 2016-12-31 MED ORDER — DILTIAZEM HCL ER COATED BEADS 120 MG PO CP24
120.0000 mg | ORAL_CAPSULE | Freq: Every day | ORAL | Status: DC
  Administered 2017-01-01 – 2017-01-03 (×3): 120 mg via ORAL
  Filled 2016-12-31 (×3): qty 1

## 2016-12-31 MED ORDER — MORPHINE SULFATE (PF) 2 MG/ML IV SOLN: 2.0000 mg | INTRAVENOUS | Status: DC | PRN

## 2016-12-31 NOTE — ED Notes (Signed)
Patient transported to CT 

## 2016-12-31 NOTE — ED Notes (Signed)
Blood draw attempt for 2nd set of blood cultures was unsuccessful

## 2016-12-31 NOTE — ED Notes (Signed)
Pt. Attempted to sit on side of bed to go to the bathroom. Pt. vomited a large amount.

## 2016-12-31 NOTE — H&P (Signed)
History and Physical    Dawn Braun ZOX:[id number] DOB: 27-Mar-1966 DOA: 12/31/2016  Referring MD/NP/PA:   PCP: Laurena Slimmer, MD   Patient coming from:  The patient is coming from home.  At baseline, pt is independent for most of ADL.   Chief Complaint: Nausea and vomiting   HPI: Dawn Braun is a 51 y.o. female with medical history significant of diabetes mellitus, asthma, atrial fibrillation on Eliquis, who presents with nausea and vomiting.  Pt underwent lap chole on 12/23/16. She was discharged on 12/29/16 at stable condition. Per Dr. Fredricka Bonine of Gen. surgeon's note, patient had hematoma in gallbladder bed. Pt states that she has been unable to keep anything down over the past 2 days. She has nausea and vomited several times without blood in the vomitus. She denies abdominal pain. No subjective fever or chills. Patient's surgical sites are healing well, no pain. Patient denies chest pain, SOB, cough, symptoms of UTI or unilateral weakness. She hasn't had a BM since 12/22/16. She doesn't think she is passing flatus.  ED Course: pt was found to have WBC 14.5, lipase 28, negative urinalysis, mild acute renal injury with creatinine 1.12, tachycardia, tachypnea, oxygen saturation 100% on room air. Patient is admitted to telemetry bed as inpatient. Gen. surgeon was consulted by EDP.  # CT 1. Persistent complex fluid is seen tracking from the gallbladder fossa into the pelvis along the subhepatic space and right paracolicgutter consistent with known hemoperitoneum. This fluid is better visualized on today's study given administration of intravenouscontrast medium. Allowing for differences in technique, no significant progression in size is appreciated of this fluid. No enhancing collections are noted to suggest an abscess. 2. Mild fluid-filled distention of left upper quadrant small bowel loops query enteritis or possibly localized mild small bowel ileus. 3. Interval clearing of  right effusion, pneumobilia and most of the subcutaneous emphysema since prior exam. Residual periumbilical soft tissue emphysema persists. 4. Fibroid uterus. 5. Subcentimeter right renal cyst too small to further characterize.   Review of Systems:   General: no fevers, chills, no body weight gain, has poor appetite, has fatigue HEENT: no blurry vision, hearing changes or sore throat Respiratory: no dyspnea, coughing, wheezing CV: no chest pain, no palpitations GI: has nausea, vomiting, no abdominal pain, diarrhea, has constipation GU: no dysuria, burning on urination, increased urinary frequency, hematuria  Ext: no leg edema Neuro: no unilateral weakness, numbness, or tingling, no vision change or hearing loss Skin: no rash, no skin tear. MSK: No muscle spasm, no deformity, no limitation of range of movement in spin Heme: No easy bruising.  Travel history: No recent long distant travel.  Allergy: No Known Allergies  Past Medical History:  Diagnosis Date  . Asthma   . Diabetes mellitus without complication (HCC)    type 2  . Paroxysmal atrial fibrillation [facility])     Past Surgical History:  Procedure Laterality Date  . CHOLECYSTECTOMY N/A 12/23/2016   Procedure: LAPAROSCOPIC CHOLECYSTECTOMY;  Surgeon: Kinsinger, De Blanch, MD;  Location: [facility];  Service: General;  Laterality: N/A;  . EYE SURGERY Right   . KNEE ARTHROSCOPY Right   . SPLIT NIGHT STUDY  08/06/2015  . TUBAL LIGATION      Social History:  reports that she has never smoked. She has never used smokeless tobacco. She reports that she drinks alcohol. She reports that she does not use drugs.  Family History:  Family History  Problem Relation Age of Onset  . Epilepsy Father   .  Diabetes Mother   . Kidney disease Mother   . Hypertension Mother   . Kidney disease Brother   . Diabetic kidney disease Brother   . Diabetes Brother      Prior to Admission medications   Medication Sig Start Date End Date Taking?  Authorizing Provider  apixaban (ELIQUIS) 5 MG TABS tablet Take 1 tablet (5 mg total) by mouth 2 (two) times daily. 07/11/15  Yes Newman Nip, NP  CARTIA XT 120 MG 24 hr capsule TAKE 1 CAPSULE BY MOUTH ONCE DAILY. NEED APPT FOR FURTHER REFILLS 12/17/16  Yes Newman Nip, NP  diltiazem (CARDIZEM) 30 MG tablet Cardizem  -- take 1 tablet by mouth every 4 hours AS NEEDED for heart rate >100 as long as blood pressure >100. 07/01/15  Yes Newman Nip, NP  docusate sodium (COLACE) 100 MG capsule Take 1 capsule (100 mg total) by mouth 2 (two) times daily. 12/29/16  Yes Berna Bue, MD  flecainide (TAMBOCOR) 100 MG tablet Take 1 tablet (100 mg total) by mouth 2 (two) times daily. 03/03/16  Yes Newman Nip, NP  metoprolol tartrate (LOPRESSOR) 25 MG tablet Take 1 tablet (25 mg total) by mouth 2 (two) times daily. 12/29/16  Yes Berna Bue, MD  oxyCODONE (OXY IR/ROXICODONE) 5 MG immediate release tablet Take 1 tablet (5 mg total) by mouth every 4 (four) hours as needed for severe pain or breakthrough pain. 12/29/16  Yes Berna Bue, MD  polyethylene glycol (MIRALAX / GLYCOLAX) packet Take 17 g by mouth daily as needed for mild constipation.   Yes [provider]  VICTOZA 18 MG/3ML SOPN Inject 1.8 mg into the skin daily. 08/17/15  Yes [provider]    Physical Exam: Vitals:   01/01/17 0059 01/01/17 0100 01/01/17 0143 01/01/17 0423  BP: (!) 151/82 (!) 161/78 (!) 151/84 127/71  Pulse: (!) 109 (!) 108 (!) 108 (!) 107  Resp: (!) Temp:   98.4 F (36.9 C) 98.4 F (36.9 C)  TempSrc:   Oral Oral  SpO2: 100% 100% 100% 100%  Weight:   91 kg (200 lb 9.6 oz)   Height:    (1.575 m)    General: Not in acute distress HEENT:       Eyes: PERRL, EOMI, no scleral icterus.       ENT: No discharge from the ears and nose, no pharynx injection, no tonsillar enlargement.        Neck: No JVD, no bruit, no mass felt. Heme: No neck lymph node  enlargement. Cardiac: S1/S2, RRR, No murmurs, No gallops or rubs. Respiratory: No rales, wheezing, rhonchi or rubs. GI: Soft, nondistended, has mild tenderness in RUQ, no rebound pain, no organomegaly, BS present. GU: No hematuria Ext: No pitting leg edema bilaterally. 2+DP/PT pulse bilaterally. Musculoskeletal: No joint deformities, No joint redness or warmth, no limitation of ROM in spin. Skin: No rashes.  Neuro: Alert, oriented X3, cranial nerves II-XII grossly intact, moves all extremities normally.  Psych: Patient is not psychotic, no suicidal or hemocidal ideation.  Labs on Admission: I have personally reviewed following labs and imaging studies  CBC:  Recent Labs Lab 12/27/16 0604 12/28/16 0823 12/29/16 0757 12/31/16 1251 01/01/17 0425  WBC 16.5* 12.4* 13.2* 14.5* 14.0*  HGB 9.2* 9.1* 9.2* 10.1* 9.3*  HCT 27.5* 26.7* 27.1* 29.7* 28.0*  MCV 84.9 85.6 85.2 83.7 85.1  PLT 333 302 355 462* 439*   Basic Metabolic Panel:  Recent Labs Lab 12/27/16 0604 12/28/16 0823 12/29/16 0757 12/31/16 1251 01/01/17 0425  NA 137 134* 137 140 141  K 3.7 3.7 3.7 3.7 3.5  CL 102 104 104 104 109  CO2 24 22 23 22 22   GLUCOSE 120* 157* 185* 130* 128*  BUN 15 16 14  23* 18  CREATININE 0.94 1.01* 0.93 1.12* 0.86  CALCIUM 8.4* 8.0* 8.2* 8.7* 8.0*   GFR: Estimated Creatinine Clearance: 80.3 mL/min (by C-G formula based on SCr of 0.86 mg/dL). Liver Function Tests:  Recent Labs Lab 12/26/16 0620 12/31/16 1251  AST 34 30  ALT 38 23  ALKPHOS 79 96  BILITOT 0.7 1.2  PROT 6.4* 7.2  ALBUMIN 2.9* 3.4*    Recent Labs Lab 12/31/16 1251  LIPASE 28   No results for input(s): AMMONIA in the last 168 hours. Coagulation Profile:  Recent Labs Lab 12/31/16 2215  INR 1.10   Cardiac Enzymes: No results for input(s): CKTOTAL, CKMB, CKMBINDEX, TROPONINI in the last 168 hours. BNP (last 3 results) No results for input(s): PROBNP in the last 8760 hours. HbA1C: No results for input(s):  HGBA1C in the last 72 hours. CBG:  Recent Labs Lab 12/28/16 1654 12/28/16 2229 12/29/16 0742 12/31/16 1238 01/01/17 0028  GLUCAP 152* 144* 178* 118* 108*   Lipid Profile: No results for input(s): CHOL, HDL, LDLCALC, TRIG, CHOLHDL, LDLDIRECT in the last 72 hours. Thyroid Function Tests: No results for input(s): TSH, T4TOTAL, FREET4, T3FREE, THYROIDAB in the last 72 hours. Anemia Panel: No results for input(s): VITAMINB12, FOLATE, FERRITIN, TIBC, IRON, RETICCTPCT in the last 72 hours. Urine analysis:    Component Value Date/Time   COLORURINE AMBER (A) 12/31/2016 1300   APPEARANCEUR HAZY (A) 12/31/2016 1300   LABSPEC 1.026 12/31/2016 1300   PHURINE 5.0 12/31/2016 1300   GLUCOSEU NEGATIVE 12/31/2016 1300   HGBUR MODERATE (A) 12/31/2016 1300   BILIRUBINUR SMALL (A) 12/31/2016 1300   KETONESUR 80 (A) 12/31/2016 1300   PROTEINUR >=300 (A) 12/31/2016 1300   NITRITE NEGATIVE 12/31/2016 1300   LEUKOCYTESUR NEGATIVE 12/31/2016 1300   Sepsis Labs: @LABRCNTIP (procalcitonin:4,lacticidven:4) ) Recent Results (from the past 240 hour(s))  Surgical PCR screen     Status: None   Collection Time: 12/23/16  3:20 PM  Result Value Ref Range Status   MRSA, PCR NEGATIVE NEGATIVE Final   Staphylococcus aureus NEGATIVE NEGATIVE Final    Comment: (NOTE) The Xpert SA Assay (FDA approved for NASAL specimens in patients 16 years of age and older), is one component of a comprehensive surveillance program. It is not intended to diagnose infection nor to guide or monitor treatment.      Radiological Exams on Admission: Ct Abdomen Pelvis W Contrast  Result Date: 12/31/2016 CLINICAL DATA:  Nausea and emesis.  Cholecystectomy 12/23/2016. EXAM: CT ABDOMEN AND PELVIS WITH CONTRAST TECHNIQUE: Multidetector CT imaging of the abdomen and pelvis was performed using the standard protocol following bolus administration of intravenous contrast. CONTRAST:  100 cc ISOVUE-300 IOPAMIDOL (ISOVUE-300) INJECTION 61%  COMPARISON:  12/25/2016 CT FINDINGS: Lower chest: Clearing of small right effusion. Normal sized heart. No acute pulmonary abnormality. Hepatobiliary: Status post cholecystectomy. Complex hemorrhagic fluid is again seen tracking from the gallbladder fossa into the subhepatic space and right paracolic gutter with small volume again noted in the pelvis. Given administration of IV contrast, this fluid is better visualized on today's study and does not appear to have significantly progressed. No abscess is noted. Resolution of subdiaphragmatic fluid since prior as well as pneumobilia. Pancreas: Normal Spleen:  Normal Adrenals/Urinary Tract: Normal bilateral adrenal glands. No obstructive uropathy or renal calculi. 6 mm hypodensity in the interpolar right kidney is noted statistically consistent with a cyst but too small to further characterize. The urinary bladder is unremarkable. Stomach/Bowel: Fluid-filled distention of the stomach. Mild fluid-filled distention of jejunal loops in the left upper quadrant, query mild enteritis. No mechanical bowel obstruction. The appendix and large intestine is unremarkable. Vascular/Lymphatic: No significant vascular findings are present. No enlarged abdominal or pelvic lymph nodes. Reproductive: Fibroid uterus. Largest calcified uterine fibroid is seen on the right estimated at 2.4 cm in maximum dimension currently. No adnexal mass. Other: Postop induration at sites of laparoscopic ports in the lower abdomen and umbilicus with subcutaneous emphysema adjacent to the umbilicus and within the umbilicus. Musculoskeletal: No acute or significant osseous findings. IMPRESSION: 1. Persistent complex fluid is seen tracking from the gallbladder fossa into the pelvis along the subhepatic space and right paracolic gutter consistent with known hemoperitoneum. This fluid is better visualized on today's study given administration of intravenous contrast medium. Allowing for differences in  technique, no significant progression in size is appreciated of this fluid. No enhancing collections are noted to suggest an abscess. 2. Mild fluid-filled distention of left upper quadrant small bowel loops query enteritis or possibly localized mild small bowel ileus. 3. Interval clearing of right effusion, pneumobilia and most of the subcutaneous emphysema since prior exam. Residual periumbilical soft tissue emphysema persists. 4. Fibroid uterus. 5. Subcentimeter right renal cyst too small to further characterize. Electronically Signed   By: Tollie Eth M.D.   On: 12/31/2016 20:33   Dg Abd Acute W/chest  Result Date: 12/31/2016 CLINICAL DATA:  Emesis since cholecystectomy 12/29/2016. Shortness of breath. EXAM: DG ABDOMEN ACUTE W/ 1V CHEST COMPARISON:  Chest x-ray 01/03/2016 and CT 12/25/2016 FINDINGS: Lungs are hypoinflated and otherwise clear. Cardiomediastinal silhouette and remainder of the chest is within normal. Abdominopelvic images demonstrate a nonobstructive bowel gas pattern. No evidence of free peritoneal air. Evidence of known calcified uterine fibroid. Few pelvic phleboliths. Remaining bony structures are normal. IMPRESSION: Negative abdominal radiographs.  No acute cardiopulmonary disease. Electronically Signed   By: Elberta Fortis M.D.   On: 12/31/2016 16:36     EKG: Not done in ED, will get one.   Assessment/Plan Principal Problem:   Nausea & vomiting Active Problems:   Diabetes mellitus without complication (HCC)   Paroxysmal atrial fibrillation (HCC)   Asthma   SIRS (systemic inflammatory response syndrome) (HCC)   Hemoperitoneum   AKI (acute kidney injury) (HCC)   Nausea & vomiting: Etiology is not clear. Patient is s/p of lap chole on 9/19. Lipase is normal. CT  Scan showed hemoperitoneum, but no significant progression in size is appreciated of this fluid and also no enhancing collections are noted to suggest an abscess. CT scan also showed possible enteritis vs mild small  bowel ileus. Patient has leukocytosis, but no fever, will not start Abx now.   -will admit to tele bed as inpt -When necessary Zofran for nausea and morphine for pain -IVF: Patient received 1 L of ringer solution, will give 2 L of NS bolus and then 100 cc/h of NS -start IV pepcid  SIRS vs. Sepsis: Patient meets criteria for sepsis with leukocytosis, tachycardia and tachypnea, but no fever. Clinically more likely SIRS, rather than sepsis.  -will get Procalcitonin and trend lactic acid levels per sepsis protocol. -IVF:  As above -f/u Bx  Diabetes mellitus without complication: Last A1c 6.9 on 12/24/16, well  controled. Patient is taking victoza at home -SSI  Paroxysmal atrial fibrillation [facility]): CHA2DS2-VASc Score is 2, needs oral anticoagulation. Patient is on Eliquis at home. -hold Eliquis in case pt gets worse and need procedure and also because of hemoperitoneum -continue metoprolol  Asthma: stable -prn albuterol nebs  Mild AKI: Cre 1.12. Likely due to prerenal secondary to dehydration - IVF as above - Follow up renal function by BMP   DVT ppx: SCD Code Status: Full code Family Communication: Yes, patient's daughter at bed side Disposition Plan:  Anticipate discharge back to previous home environment Consults called: Gen. surgeon was consulted by EDP. Admission status:  Inpatient/tele  Date of Service 01/01/2017    Lorretta Harp [facility] Pager [phone]  If 7PM-7AM, please contact night-coverage [email or web address] Password [email or web address] 01/01/2017, 6:10 AM

## 2016-12-31 NOTE — ED Provider Notes (Signed)
[facility]-EMERGENCY DEPT Provider Note   CSN: [id number] Arrival date & time: 12/31/16  1209     History   Chief Complaint Chief Complaint  Patient presents with  . Emesis    HPI Dawn Braun is a 51 y.o. female.  HPI Pt comes in with cc of nausea and emesis. Pt has hx of DM, PAF and is s/p cholecystectomy (9/19). PT was discharged 2 days ago. Pt reports that she has been unable to keep anything down over the past 2 days and is dizzy when she gets up. Pt has no abd pain. She hasn't had a BM since the surgery and now doesn't think she is passing flatus.   Past Medical History:  Diagnosis Date  . Asthma   . Diabetes mellitus without complication (HCC)    type 2  . Paroxysmal atrial fibrillation [facility])     Patient Active Problem List   Diagnosis Date Noted  . Diabetes mellitus (HCC) 12/23/2016    Past Surgical History:  Procedure Laterality Date  . CHOLECYSTECTOMY N/A 12/23/2016   Procedure: LAPAROSCOPIC CHOLECYSTECTOMY;  Surgeon: Kinsinger, De Blanch, MD;  Location: [facility] ORS;  Service: General;  Laterality: N/A;  . EYE SURGERY Right   . KNEE ARTHROSCOPY Right   . SPLIT NIGHT STUDY  08/06/2015  . TUBAL LIGATION      OB History    No data available       Home Medications    Prior to Admission medications   Medication Sig Start Date End Date Taking? Authorizing Provider  apixaban (ELIQUIS) 5 MG TABS tablet Take 1 tablet (5 mg total) by mouth 2 (two) times daily. 07/11/15  Yes Newman Nip, NP  CARTIA XT 120 MG 24 hr capsule TAKE 1 CAPSULE BY MOUTH ONCE DAILY. NEED APPT FOR FURTHER REFILLS 12/17/16  Yes Newman Nip, NP  diltiazem (CARDIZEM) 30 MG tablet Cardizem  -- take 1 tablet by mouth every 4 hours AS NEEDED for heart rate >100 as long as blood pressure >100. 07/01/15  Yes Newman Nip, NP  docusate sodium (COLACE) 100 MG capsule Take 1 capsule (100 mg total) by mouth 2 (two) times daily. 12/29/16  Yes Berna Bue, MD  flecainide (TAMBOCOR) 100  MG tablet Take 1 tablet (100 mg total) by mouth 2 (two) times daily. 03/03/16  Yes Newman Nip, NP  metoprolol tartrate (LOPRESSOR) 25 MG tablet Take 1 tablet (25 mg total) by mouth 2 (two) times daily. 12/29/16  Yes Berna Bue, MD  oxyCODONE (OXY IR/ROXICODONE) 5 MG immediate release tablet Take 1 tablet (5 mg total) by mouth every 4 (four) hours as needed for severe pain or breakthrough pain. 12/29/16  Yes Berna Bue, MD  polyethylene glycol (MIRALAX / GLYCOLAX) packet Take 17 g by mouth daily as needed for mild constipation.   Yes [provider]  VICTOZA 18 MG/3ML SOPN Inject 1.8 mg into the skin daily. 08/17/15  Yes [provider]    Family History Family History  Problem Relation Age of Onset  . Epilepsy Father   . Diabetes Mother   . Kidney disease Mother   . Hypertension Mother   . Kidney disease Brother   . Diabetic kidney disease Brother   . Diabetes Brother     Social History Social History  Substance Use Topics  . Smoking status: Never Smoker  . Smokeless tobacco: Never Used  . Alcohol use Yes     Comment: occasional     Allergies  Patient has no known allergies.   Review of Systems Review of Systems  Constitutional: Positive for activity change.  Gastrointestinal: Positive for abdominal pain.  All other systems reviewed and are negative.    Physical Exam Updated Vital Signs BP (!) 163/74   Pulse (!) 105   Resp 16   SpO2 100%   Physical Exam  Constitutional: She is oriented to person, place, and time. She appears well-developed.  HENT:  Head: Normocephalic and atraumatic.  Eyes: EOM are normal.  Neck: Normal range of motion. Neck supple.  Cardiovascular: Normal rate.   Pulmonary/Chest: Effort normal.  Abdominal: Soft. There is no tenderness.  No bowel sounds auscultated over 30 seconds  Neurological: She is alert and oriented to person, place, and time.  Skin: Skin is warm and dry.  Nursing note and vitals  reviewed.    ED Treatments / Results  Labs (all labs ordered are listed, but only abnormal results are displayed) Labs Reviewed  COMPREHENSIVE METABOLIC PANEL - Abnormal; Notable for the following:       Result Value   Glucose, Bld 130 (*)    BUN 23 (*)    Creatinine, Ser 1.12 (*)    Calcium 8.7 (*)    Albumin 3.4 (*)    GFR calc non Af Amer 55 (*)    All other components within normal limits  CBC - Abnormal; Notable for the following:    WBC 14.5 (*)    RBC 3.55 (*)    Hemoglobin 10.1 (*)    HCT 29.7 (*)    Platelets 462 (*)    All other components within normal limits  URINALYSIS, ROUTINE W REFLEX MICROSCOPIC - Abnormal; Notable for the following:    Color, Urine AMBER (*)    APPearance HAZY (*)    Hgb urine dipstick MODERATE (*)    Bilirubin Urine SMALL (*)    Ketones, ur 80 (*)    Protein, ur >=300 (*)    Bacteria, UA RARE (*)    Squamous Epithelial / LPF 6-30 (*)    All other components within normal limits  CBG MONITORING, ED - Abnormal; Notable for the following:    Glucose-Capillary 118 (*)    All other components within normal limits  LIPASE, BLOOD    EKG  EKG Interpretation None       Radiology Dg Abd Acute W/chest  Result Date: 12/31/2016 CLINICAL DATA:  Emesis since cholecystectomy 12/29/2016. Shortness of breath. EXAM: DG ABDOMEN ACUTE W/ 1V CHEST COMPARISON:  Chest x-ray 01/03/2016 and CT 12/25/2016 FINDINGS: Lungs are hypoinflated and otherwise clear. Cardiomediastinal silhouette and remainder of the chest is within normal. Abdominopelvic images demonstrate a nonobstructive bowel gas pattern. No evidence of free peritoneal air. Evidence of known calcified uterine fibroid. Few pelvic phleboliths. Remaining bony structures are normal. IMPRESSION: Negative abdominal radiographs.  No acute cardiopulmonary disease. Electronically Signed   By: Elberta Fortis M.D.   On: 12/31/2016 16:36    Procedures Procedures (including critical care time)  Medications  Ordered in ED Medications  ondansetron (ZOFRAN-ODT) disintegrating tablet 4 mg (4 mg Oral Refused 12/31/16 1235)  0.9 %  sodium chloride infusion ( Intravenous Hold 12/31/16 1545)  ondansetron (ZOFRAN) injection 4 mg (4 mg Intravenous Given 12/31/16 1545)  lactated ringers bolus 1,000 mL (1,000 mLs Intravenous New Bag/Given 12/31/16 1545)     Initial Impression / Assessment and Plan / ED Course  I have reviewed the triage vital signs and the nursing notes.  Pertinent labs & imaging  results that were available during my care of the patient were reviewed by me and considered in my medical decision making (see chart for details).    Pt comes in with cc of nausea and emesis. She is s/p cholecystectomy and had minor complication post op. Pt has no abd pain and the exam is benign. Pt is certainly orthostatic and appears dehydrated. We will ensure the e;lytes are normal. Concerns also for ileus and SBO. Pt has no abd pain, so biliary drainage or expanding hematoma is low on the ddx. I still spoke with Surgery to see if we should get CT scan, Dr. Fredricka Bonine who discharged the patient 2 days ago and she recommends AAS for now, and if neg and pt continues to be pain free then consider medicine admission if indicated.   6:00 PM PO challenge started. Dr. Clarene Duke to f.u. Results from the ER workup discussed with the patient face to face and all questions answered to the best of my ability.  If pt needs admission, we will call surgery first.  Final Clinical Impressions(s) / ED Diagnoses   Final diagnoses:  Orthostatic hypotension  Dizziness  Severe dehydration  Ketonuria    New Prescriptions New Prescriptions   No medications on file     Derwood Kaplan, MD 12/31/16 1801

## 2016-12-31 NOTE — ED Triage Notes (Addendum)
Pt c/o emesis and inability to keep anything down since being discharged from [facility] on 9/25 for a cholecystectomy. Pt reports not experiencing a bowel movement since 9/18. A/O  X4. Pt currently vomiting while being triaged. Denies pain, and refuses zofran tablet.

## 2017-01-01 DIAGNOSIS — R112 Nausea with vomiting, unspecified: Secondary | ICD-10-CM | POA: Diagnosis not present

## 2017-01-01 LAB — BASIC METABOLIC PANEL
Anion gap: 10 (ref 5–15)
BUN: 18 mg/dL (ref 6–20)
CO2: 22 mmol/L (ref 22–32)
Calcium: 8 mg/dL — ABNORMAL LOW (ref 8.9–10.3)
Chloride: 109 mmol/L (ref 101–111)
Creatinine, Ser: 0.86 mg/dL (ref 0.44–1.00)
GFR calc Af Amer: >60 mL/min (ref >60)
GFR calc non Af Amer: >60 mL/min (ref >60)
Glucose, Bld: 128 mg/dL — ABNORMAL HIGH (ref 65–99)
Potassium: 3.5 mmol/L (ref 3.5–5.1)
Sodium: 141 mmol/L (ref 135–145)

## 2017-01-01 LAB — GLUCOSE, CAPILLARY
Glucose-Capillary: 130 mg/dL — ABNORMAL HIGH (ref 65–99)
Glucose-Capillary: 99 mg/dL (ref 65–99)
Glucose-Capillary: 130 mg/dL — ABNORMAL HIGH (ref 65–99)
Glucose-Capillary: 150 mg/dL — ABNORMAL HIGH (ref 65–99)

## 2017-01-01 LAB — CBC
HCT: 28 % — ABNORMAL LOW (ref 36.0–46.0)
Hemoglobin: 9.3 g/dL — ABNORMAL LOW (ref 12.0–15.0)
MCH: 28.3 pg (ref 26.0–34.0)
MCHC: 33.2 g/dL (ref 30.0–36.0)
MCV: 85.1 fL (ref 78.0–100.0)
Platelets: 439 10*3/uL — ABNORMAL HIGH (ref 150–400)
RBC: 3.29 MIL/uL — ABNORMAL LOW (ref 3.87–5.11)
RDW: 14.5 % (ref 11.5–15.5)
WBC: 14 10*3/uL — ABNORMAL HIGH (ref 4.0–10.5)

## 2017-01-01 LAB — CBG MONITORING, ED: Glucose-Capillary: 108 mg/dL — ABNORMAL HIGH (ref 65–99)

## 2017-01-01 MED ORDER — SORBITOL 70 % SOLN
960.0000 mL | Freq: Once | Status: AC
Start: 2017-01-01 — End: 2017-01-01
  Administered 2017-01-01: 960 mL via RECTAL
  Filled 2017-01-01: qty 240

## 2017-01-01 MED ORDER — ONDANSETRON HCL 4 MG/2ML IJ SOLN
4.0000 mg | Freq: Four times a day (QID) | INTRAMUSCULAR | Status: DC | PRN
  Administered 2017-01-01 – 2017-01-02 (×2): 4 mg via INTRAVENOUS
  Filled 2017-01-01 (×2): qty 2

## 2017-01-01 MED ORDER — MORPHINE SULFATE (PF) 4 MG/ML IV SOLN: 2.0000 mg | INTRAVENOUS | Status: DC | PRN

## 2017-01-01 NOTE — Progress Notes (Addendum)
Patient ID: Dawn Braun, female   DOB: 07-22-1965, 51 y.o.   MRN: [id number]  PROGRESS NOTE    Dawn Braun  WUJ:[id number] DOB: 06/13/1965 DOA: 12/31/2016  PCP: Laurena Slimmer, MD   Brief Narrative:  51 year old female with DM, asthma, atrial fibrillation on Eliquis, underwent lap chole 12/23/2016, has had hematoma in gallbladder bed. For past 2 days, she was not able to keep anything down and even with ice she has nausea and vomiting. CT abd showed persistent complex fluid seen tracking from the gallbladder fossa into the pelvis along the subhepatic space and right paracolic gutter consistent with known hemoperitoneum. Mild fluid-filled distention of left upper quadrant small bowel loops query enteritis or possibly localized mild small bowel ileus. Interval clearing of right effusion, pneumobilia and most of the subcutaneous emphysema since prior exam.  Surgery has seen the pt in consultation.   Assessment & Plan:   Principal Problem:   Nausea & vomiting - S/P recent lap chole - Per surgery suspect local irration from small amount of hemoperitoneum - Surgery recommend enema and continue supportive care   Active Problems:   Leukocytosis - Likely reactive from recent surgery     Acute renal failure - Due to GI losses - Resolved with IV fluids     Diabetes mellitus without complication (HCC) - On SSI    Paroxysmal atrial fibrillation (HCC) - CHA2DS2-VASc Score2 - Continue apixaban - Continue Cardizem and metoprolol  - Continue flecainide   DVT prophylaxis: On Apixaban Code Status: full code  Family Communication: no family at the bedside Disposition Plan: home once tolerates regular diet    Consultants:   Surgery   Procedures:   None   Antimicrobials:   None    Subjective: No overnight events.  Objective: Vitals:   01/01/17 0143 01/01/17 0423 01/01/17 1120 01/01/17 1321  BP: (!) 151/84 127/71 (!) 144/67 (!) 174/66  Pulse: (!) 108 (!) 107  99 94  Resp: Temp: 98.4 F (36.9 C) 98.4 F (36.9 C)  97.8 F (36.6 C)  TempSrc: Oral Oral  Oral  SpO2: 100% 100%  94%  Weight: 91 kg (200 lb 9.6 oz)     Height:  (1.575 m)       Intake/Output Summary (Last 24 hours) at 01/01/17 1455 Last data filed at 01/01/17 0600  Gross per 24 hour  Intake             1875 ml  Output                1 ml  Net             1874 ml   Filed Weights   01/01/17 0143  Weight: 91 kg (200 lb 9.6 oz)    Examination:  General exam: Appears calm and comfortable  Respiratory system: Clear to auscultation. Respiratory effort normal. Cardiovascular system: S1 & S2 heard, RRR. No JVD, murmurs, rubs, gallops or clicks. No pedal edema. Gastrointestinal system: Abdomen is nondistended, soft. Tender in mid abdomen  Central nervous system: Alert and oriented. No focal neurological deficits. Extremities: Symmetric 5 x 5 power. Skin: No rashes, lesions or ulcers Psychiatry: Judgement and insight appear normal. Mood & affect appropriate.   Data Reviewed: I have personally reviewed following labs and imaging studies  CBC:  Recent Labs Lab 12/27/16 0604 12/28/16 0823 12/29/16 0757 12/31/16 1251 01/01/17 0425  WBC 16.5* 12.4* 13.2* 14.5* 14.0*  HGB 9.2* 9.1* 9.2*  10.1* 9.3*  HCT 27.5* 26.7* 27.1* 29.7* 28.0*  MCV 84.9 85.6 85.2 83.7 85.1  PLT 333 302 355 462* 439*   Basic Metabolic Panel:  Recent Labs Lab 12/27/16 0604 12/28/16 0823 12/29/16 0757 12/31/16 1251 01/01/17 0425  NA 137 134* 137 140 141  K 3.7 3.7 3.7 3.7 3.5  CL 102 104 104 104 109  CO2 GLUCOSE 120* 157* 185* 130* 128*  BUN 23* 18  CREATININE 0.94 1.01* 0.93 1.12* 0.86  CALCIUM 8.4* 8.0* 8.2* 8.7* 8.0*   GFR: Estimated Creatinine Clearance: 80.3 mL/min (by C-G formula based on SCr of 0.86 mg/dL). Liver Function Tests:  Recent Labs Lab 12/26/16 0620 12/31/16 1251  AST 34 30  ALT 38 23  ALKPHOS 79 96  BILITOT 0.7 1.2  PROT  6.4* 7.2  ALBUMIN 2.9* 3.4*    Recent Labs Lab 12/31/16 1251  LIPASE 28   No results for input(s): AMMONIA in the last 168 hours. Coagulation Profile:  Recent Labs Lab 12/31/16 2215  INR 1.10   Cardiac Enzymes: No results for input(s): CKTOTAL, CKMB, CKMBINDEX, TROPONINI in the last 168 hours. BNP (last 3 results) No results for input(s): PROBNP in the last 8760 hours. HbA1C: No results for input(s): HGBA1C in the last 72 hours. CBG:  Recent Labs Lab 12/29/16 0742 12/31/16 1238 01/01/17 0028 01/01/17 0728 01/01/17 1118  GLUCAP 178* 118* 108* 130* 99   Lipid Profile: No results for input(s): CHOL, HDL, LDLCALC, TRIG, CHOLHDL, LDLDIRECT in the last 72 hours. Thyroid Function Tests: No results for input(s): TSH, T4TOTAL, FREET4, T3FREE, THYROIDAB in the last 72 hours. Anemia Panel: No results for input(s): VITAMINB12, FOLATE, FERRITIN, TIBC, IRON, RETICCTPCT in the last 72 hours. Urine analysis:    Component Value Date/Time   COLORURINE AMBER (A) 12/31/2016 1300   APPEARANCEUR HAZY (A) 12/31/2016 1300   LABSPEC 1.026 12/31/2016 1300   PHURINE 5.0 12/31/2016 1300   GLUCOSEU NEGATIVE 12/31/2016 1300   HGBUR MODERATE (A) 12/31/2016 1300   BILIRUBINUR SMALL (A) 12/31/2016 1300   KETONESUR 80 (A) 12/31/2016 1300   PROTEINUR >=300 (A) 12/31/2016 1300   NITRITE NEGATIVE 12/31/2016 1300   LEUKOCYTESUR NEGATIVE 12/31/2016 1300   Sepsis Labs: (procalcitonin:4,lacticidven:4)   ) Recent Results (from the past 240 hour(s))  Surgical PCR screen     Status: None   Collection Time: 12/23/16  3:20 PM  Result Value Ref Range Status   MRSA, PCR NEGATIVE NEGATIVE Final   Staphylococcus aureus NEGATIVE NEGATIVE Final    Comment: (NOTE) The Xpert SA Assay (FDA approved for NASAL specimens in patients 36 years of age and older), is one component of a comprehensive surveillance program. It is not intended to diagnose infection nor to guide or monitor treatment.         Radiology Studies: Ct Abdomen Pelvis W Contrast  Result Date: 12/31/2016 CLINICAL DATA:  Nausea and emesis.  Cholecystectomy 12/23/2016. EXAM: CT ABDOMEN AND PELVIS WITH CONTRAST TECHNIQUE: Multidetector CT imaging of the abdomen and pelvis was performed using the standard protocol following bolus administration of intravenous contrast. CONTRAST:  100 cc ISOVUE-300 IOPAMIDOL (ISOVUE-300) INJECTION 61% COMPARISON:  12/25/2016 CT FINDINGS: Lower chest: Clearing of small right effusion. Normal sized heart. No acute pulmonary abnormality. Hepatobiliary: Status post cholecystectomy. Complex hemorrhagic fluid is again seen tracking from the gallbladder fossa into the subhepatic space and right paracolic gutter with small volume again noted in the pelvis. Given administration of IV contrast, this fluid  is better visualized on today's study and does not appear to have significantly progressed. No abscess is noted. Resolution of subdiaphragmatic fluid since prior as well as pneumobilia. Pancreas: Normal Spleen: Normal Adrenals/Urinary Tract: Normal bilateral adrenal glands. No obstructive uropathy or renal calculi. 6 mm hypodensity in the interpolar right kidney is noted statistically consistent with a cyst but too small to further characterize. The urinary bladder is unremarkable. Stomach/Bowel: Fluid-filled distention of the stomach. Mild fluid-filled distention of jejunal loops in the left upper quadrant, query mild enteritis. No mechanical bowel obstruction. The appendix and large intestine is unremarkable. Vascular/Lymphatic: No significant vascular findings are present. No enlarged abdominal or pelvic lymph nodes. Reproductive: Fibroid uterus. Largest calcified uterine fibroid is seen on the right estimated at 2.4 cm in maximum dimension currently. No adnexal mass. Other: Postop induration at sites of laparoscopic ports in the lower abdomen and umbilicus with subcutaneous emphysema adjacent to the umbilicus  and within the umbilicus. Musculoskeletal: No acute or significant osseous findings. IMPRESSION: 1. Persistent complex fluid is seen tracking from the gallbladder fossa into the pelvis along the subhepatic space and right paracolic gutter consistent with known hemoperitoneum. This fluid is better visualized on today's study given administration of intravenous contrast medium. Allowing for differences in technique, no significant progression in size is appreciated of this fluid. No enhancing collections are noted to suggest an abscess. 2. Mild fluid-filled distention of left upper quadrant small bowel loops query enteritis or possibly localized mild small bowel ileus. 3. Interval clearing of right effusion, pneumobilia and most of the subcutaneous emphysema since prior exam. Residual periumbilical soft tissue emphysema persists. 4. Fibroid uterus. 5. Subcentimeter right renal cyst too small to further characterize. Electronically Signed   By: Tollie Eth M.D.   On: 12/31/2016 20:33   Dg Abd Acute W/chest  Result Date: 12/31/2016 CLINICAL DATA:  Emesis since cholecystectomy 12/29/2016. Shortness of breath. EXAM: DG ABDOMEN ACUTE W/ 1V CHEST COMPARISON:  Chest x-ray 01/03/2016 and CT 12/25/2016 FINDINGS: Lungs are hypoinflated and otherwise clear. Cardiomediastinal silhouette and remainder of the chest is within normal. Abdominopelvic images demonstrate a nonobstructive bowel gas pattern. No evidence of free peritoneal air. Evidence of known calcified uterine fibroid. Few pelvic phleboliths. Remaining bony structures are normal. IMPRESSION: Negative abdominal radiographs.  No acute cardiopulmonary disease. Electronically Signed   By: Elberta Fortis M.D.   On: 12/31/2016 16:36        Scheduled Meds: . diltiazem  120 mg Oral Daily  . docusate sodium  100 mg Oral BID  . flecainide  100 mg Oral BID  . insulin aspart  0-5 Units Subcutaneous QHS  . insulin aspart  0-9 Units Subcutaneous TID WC  . metoprolol  tartrate  25 mg Oral BID   Continuous Infusions: . sodium chloride 100 mL/hr at 01/01/17 1350  . famotidine (PEPCID) IV Stopped (01/01/17 0945)     LOS: 1 day    Time spent: 25 minutes  Greater than 50% of the time spent on counseling and coordinating the care.   Manson Passey, MD [facility] Pager [phone]  If 7PM-7AM, please contact night-coverage [email or web address] Password [facility] 01/01/2017, 2:55 PM

## 2017-01-01 NOTE — Progress Notes (Signed)
Subjective/Chief Complaint: Unfortunately she returned with recurrent nausea and vomiting. Has not had a bowel movement in over a week.    Objective: Vital signs in last 24 hours: Temp:  [98 F (36.7 C)-98.4 F (36.9 C)] 98.4 F (36.9 C) (09/28 0423) Pulse Rate:  [89-120] 107 (09/28 0423) Resp:  [8-21] 20 (09/28 0423) BP: (127-188)/(71-112) 127/71 (09/28 0423) SpO2:  [99 %-100 %] 100 % (09/28 0423) Weight:  [91 kg (200 lb 9.6 oz)] 91 kg (200 lb 9.6 oz) (09/28 0143) Last BM Date: 12/22/16  Intake/Output from previous day: 09/27 0701 - 09/28 0700 In: 1875 [I.V.:825; IV Piggyback:1050] Out: 1 [Urine:1] Intake/Output this shift: No intake/output data recorded.  she is alert and well-appearing. Unlabored respirations. Abdomen is soft, nontender nondistended. Incisions are healing well without any signs of infection or hernia. Skin is warm and dry  Lab Results:   Recent Labs  12/31/16 1251 01/01/17 0425  WBC 14.5* 14.0*  HGB 10.1* 9.3*  HCT 29.7* 28.0*  PLT 462* 439*   BMET  Recent Labs  12/31/16 1251 01/01/17 0425  NA 140 141  K 3.7 3.5  CL 104 109  CO2 22 22  GLUCOSE 130* 128*  BUN 23* 18  CREATININE 1.12* 0.86  CALCIUM 8.7* 8.0*   PT/INR  Recent Labs  12/31/16 2215  LABPROT 14.1  INR 1.10   ABG No results for input(s): PHART, HCO3 in the last 72 hours.  Invalid input(s): PCO2, PO2  Studies/Results: Ct Abdomen Pelvis W Contrast  Result Date: 12/31/2016 CLINICAL DATA:  Nausea and emesis.  Cholecystectomy 12/23/2016. EXAM: CT ABDOMEN AND PELVIS WITH CONTRAST TECHNIQUE: Multidetector CT imaging of the abdomen and pelvis was performed using the standard protocol following bolus administration of intravenous contrast. CONTRAST:  100 cc ISOVUE-300 IOPAMIDOL (ISOVUE-300) INJECTION 61% COMPARISON:  12/25/2016 CT FINDINGS: Lower chest: Clearing of small right effusion. Normal sized heart. No acute pulmonary abnormality. Hepatobiliary: Status post  cholecystectomy. Complex hemorrhagic fluid is again seen tracking from the gallbladder fossa into the subhepatic space and right paracolic gutter with small volume again noted in the pelvis. Given administration of IV contrast, this fluid is better visualized on today's study and does not appear to have significantly progressed. No abscess is noted. Resolution of subdiaphragmatic fluid since prior as well as pneumobilia. Pancreas: Normal Spleen: Normal Adrenals/Urinary Tract: Normal bilateral adrenal glands. No obstructive uropathy or renal calculi. 6 mm hypodensity in the interpolar right kidney is noted statistically consistent with a cyst but too small to further characterize. The urinary bladder is unremarkable. Stomach/Bowel: Fluid-filled distention of the stomach. Mild fluid-filled distention of jejunal loops in the left upper quadrant, query mild enteritis. No mechanical bowel obstruction. The appendix and large intestine is unremarkable. Vascular/Lymphatic: No significant vascular findings are present. No enlarged abdominal or pelvic lymph nodes. Reproductive: Fibroid uterus. Largest calcified uterine fibroid is seen on the right estimated at 2.4 cm in maximum dimension currently. No adnexal mass. Other: Postop induration at sites of laparoscopic ports in the lower abdomen and umbilicus with subcutaneous emphysema adjacent to the umbilicus and within the umbilicus. Musculoskeletal: No acute or significant osseous findings. IMPRESSION: 1. Persistent complex fluid is seen tracking from the gallbladder fossa into the pelvis along the subhepatic space and right paracolic gutter consistent with known hemoperitoneum. This fluid is better visualized on today's study given administration of intravenous contrast medium. Allowing for differences in technique, no significant progression in size is appreciated of this fluid. No enhancing collections are noted to suggest  an abscess. 2. Mild fluid-filled distention of  left upper quadrant small bowel loops query enteritis or possibly localized mild small bowel ileus. 3. Interval clearing of right effusion, pneumobilia and most of the subcutaneous emphysema since prior exam. Residual periumbilical soft tissue emphysema persists. 4. Fibroid uterus. 5. Subcentimeter right renal cyst too small to further characterize. Electronically Signed   By: Tollie Eth M.D.   On: 12/31/2016 20:33   Dg Abd Acute W/chest  Result Date: 12/31/2016 CLINICAL DATA:  Emesis since cholecystectomy 12/29/2016. Shortness of breath. EXAM: DG ABDOMEN ACUTE W/ 1V CHEST COMPARISON:  Chest x-ray 01/03/2016 and CT 12/25/2016 FINDINGS: Lungs are hypoinflated and otherwise clear. Cardiomediastinal silhouette and remainder of the chest is within normal. Abdominopelvic images demonstrate a nonobstructive bowel gas pattern. No evidence of free peritoneal air. Evidence of known calcified uterine fibroid. Few pelvic phleboliths. Remaining bony structures are normal. IMPRESSION: Negative abdominal radiographs.  No acute cardiopulmonary disease. Electronically Signed   By: Elberta Fortis M.D.   On: 12/31/2016 16:36    Anti-infectives: Anti-infectives    None      Assessment/Plan: s/p laparoscopic cholecystectomy 12/22/16, Dr. Sheliah Hatch persistent nausea and vomiting. Suspect local irritation from small amount of hemoperitoneum noted on CT scan versus constipation. We will order an enema today. Continue supportive care  LOS: 1 day    Berna Bue 01/01/2017

## 2017-01-01 NOTE — ED Notes (Signed)
Gave report to [facility], RN for assigned room 1422.

## 2017-01-01 NOTE — ED Notes (Signed)
Call report to [facility], RN at [phone]. [phone]

## 2017-01-02 DIAGNOSIS — E876 Hypokalemia: Secondary | ICD-10-CM

## 2017-01-02 DIAGNOSIS — R112 Nausea with vomiting, unspecified: Secondary | ICD-10-CM | POA: Diagnosis not present

## 2017-01-02 LAB — CBC
HCT: 25.6 % — ABNORMAL LOW (ref 36.0–46.0)
Hemoglobin: 8.6 g/dL — ABNORMAL LOW (ref 12.0–15.0)
MCH: 28.9 pg (ref 26.0–34.0)
MCHC: 33.6 g/dL (ref 30.0–36.0)
MCV: 85.9 fL (ref 78.0–100.0)
Platelets: 409 10*3/uL — ABNORMAL HIGH (ref 150–400)
RBC: 2.98 MIL/uL — ABNORMAL LOW (ref 3.87–5.11)
RDW: 14.6 % (ref 11.5–15.5)
WBC: 12.2 10*3/uL — ABNORMAL HIGH (ref 4.0–10.5)

## 2017-01-02 LAB — BASIC METABOLIC PANEL
Anion gap: 6 (ref 5–15)
BUN: 11 mg/dL (ref 6–20)
CO2: 24 mmol/L (ref 22–32)
Calcium: 7.8 mg/dL — ABNORMAL LOW (ref 8.9–10.3)
Chloride: 110 mmol/L (ref 101–111)
Creatinine, Ser: 0.85 mg/dL (ref 0.44–1.00)
GFR calc Af Amer: >60 mL/min (ref >60)
GFR calc non Af Amer: >60 mL/min (ref >60)
Glucose, Bld: 122 mg/dL — ABNORMAL HIGH (ref 65–99)
Potassium: 3.4 mmol/L — ABNORMAL LOW (ref 3.5–5.1)
Sodium: 140 mmol/L (ref 135–145)

## 2017-01-02 LAB — GLUCOSE, CAPILLARY
Glucose-Capillary: 117 mg/dL — ABNORMAL HIGH (ref 65–99)
Glucose-Capillary: 162 mg/dL — ABNORMAL HIGH (ref 65–99)
Glucose-Capillary: 179 mg/dL — ABNORMAL HIGH (ref 65–99)
Glucose-Capillary: 226 mg/dL — ABNORMAL HIGH (ref 65–99)

## 2017-01-02 LAB — MAGNESIUM: Magnesium: 1.6 mg/dL — ABNORMAL LOW (ref 1.7–2.4)

## 2017-01-02 MED ORDER — MAGIC MOUTHWASH
15.0000 mL | Freq: Four times a day (QID) | ORAL | Status: DC | PRN
  Filled 2017-01-02: qty 15

## 2017-01-02 MED ORDER — POTASSIUM CHLORIDE CRYS ER 20 MEQ PO TBCR
40.0000 meq | EXTENDED_RELEASE_TABLET | Freq: Every day | ORAL | Status: DC
  Administered 2017-01-02: 40 meq via ORAL
  Filled 2017-01-02 (×2): qty 2

## 2017-01-02 MED ORDER — SACCHAROMYCES BOULARDII 250 MG PO CAPS
250.0000 mg | ORAL_CAPSULE | Freq: Two times a day (BID) | ORAL | Status: DC
  Administered 2017-01-02 – 2017-01-03 (×3): 250 mg via ORAL
  Filled 2017-01-02 (×3): qty 1

## 2017-01-02 MED ORDER — BISACODYL 10 MG RE SUPP: 10.0000 mg | Freq: Two times a day (BID) | RECTAL | Status: DC | PRN

## 2017-01-02 MED ORDER — SODIUM CHLORIDE 0.9 % IV SOLN: 250.0000 mL | INTRAVENOUS | Status: DC | PRN

## 2017-01-02 MED ORDER — LACTATED RINGERS IV BOLUS (SEPSIS): 1000.0000 mL | Freq: Three times a day (TID) | INTRAVENOUS | Status: DC | PRN

## 2017-01-02 MED ORDER — LIP MEDEX EX OINT
1.0000 "application " | TOPICAL_OINTMENT | Freq: Two times a day (BID) | CUTANEOUS | Status: DC
  Administered 2017-01-02: 1 via TOPICAL
  Filled 2017-01-02: qty 7

## 2017-01-02 MED ORDER — METHOCARBAMOL 1000 MG/10ML IJ SOLN
1000.0000 mg | Freq: Four times a day (QID) | INTRAMUSCULAR | Status: DC | PRN
  Filled 2017-01-02: qty 10

## 2017-01-02 MED ORDER — APIXABAN 5 MG PO TABS
5.0000 mg | ORAL_TABLET | Freq: Two times a day (BID) | ORAL | Status: DC
  Administered 2017-01-02 – 2017-01-03 (×2): 5 mg via ORAL
  Filled 2017-01-02 (×2): qty 1

## 2017-01-02 MED ORDER — ENSURE SURGERY PO LIQD
237.0000 mL | Freq: Two times a day (BID) | ORAL | Status: DC
  Filled 2017-01-02 (×4): qty 237

## 2017-01-02 MED ORDER — SODIUM CHLORIDE 0.9% FLUSH: 3.0000 mL | INTRAVENOUS | Status: DC | PRN

## 2017-01-02 MED ORDER — SODIUM CHLORIDE 0.9% FLUSH
3.0000 mL | Freq: Two times a day (BID) | INTRAVENOUS | Status: DC
  Administered 2017-01-02: 3 mL via INTRAVENOUS

## 2017-01-02 NOTE — Progress Notes (Signed)
Patient ID: Dawn Braun, female   DOB: 10-03-1965, 51 y.o.   MRN: [id number]  PROGRESS NOTE    JAMIELYN PETRUCCI  WUJ:[id number] DOB: [birth year] DOA: 12/31/2016  PCP: Laurena Slimmer, MD   Brief Narrative:  51 year old female with DM, asthma, atrial fibrillation on Eliquis, underwent lap chole 12/23/2016, has had hematoma in gallbladder bed. For past 2 days, she was not able to keep anything down and even with ice she has nausea and vomiting. CT abd showed persistent complex fluid seen tracking from the gallbladder fossa into the pelvis along the subhepatic space and right paracolic gutter consistent with known hemoperitoneum. Mild fluid-filled distention of left upper quadrant small bowel loops query enteritis or possibly localized mild small bowel ileus. Interval clearing of right effusion, pneumobilia and most of the subcutaneous emphysema since prior exam.    Assessment & Plan:   Principal Problem:   Nausea & vomiting - S/P recent lap chole - Likely local irritation from small amount of hemiperitoneum - Continue supportive care  Active Problems:   Leukocytosis - Likely reactive from recent surgery      Acute renal failure - Due to GI losses - Resolved with IV fluids     Diabetes mellitus without complication (HCC) - CBG's: 150, 117, 162 - Continue SSI     Paroxysmal atrial fibrillation (HCC) - CHA2DS2-VASc Score2 - Continue flecainide - Continue metoprolol and Cardizem - Continue apixaban   DVT prophylaxis: On Apixaban Code Status: full code  Family Communication: no family at the bedside  Disposition Plan: home once cleared by surgery    Consultants:   Surgery   Procedures:   None   Antimicrobials:   None    Subjective: No overnight events.   Objective: Vitals:   01/01/17 1120 01/01/17 1321 01/01/17 2159 01/02/17 0521  BP: (!) 144/67 (!) 174/66 132/65 129/64  Pulse: 99 94 98 80  Resp:  Temp:  97.8 F (36.6 C) 98.2 F (36.8  C) 98.6 F (37 C)  TempSrc:  Oral Oral Oral  SpO2:  94% 98% 100%  Weight:      Height:        Intake/Output Summary (Last 24 hours) at 01/02/17 1305 Last data filed at 01/02/17 1106  Gross per 24 hour  Intake             3180 ml  Output                0 ml  Net             3180 ml   Filed Weights   01/01/17 0143  Weight: 91 kg (200 lb 9.6 oz)    Physical Exam  Constitutional: Appears well-developed and well-nourished. No distress.  CVS: RRR, S1/S2 +, no murmurs, no gallops, no carotid bruit.  Pulmonary: Effort and breath sounds normal, no stridor, rhonchi, wheezes, rales.  Abdominal: Soft. BS +,  no distension, little tender in mid abdomen  Musculoskeletal: Normal range of motion. No edema and no tenderness.  Lymphadenopathy: No lymphadenopathy noted, cervical, inguinal. Neuro: Alert. Normal reflexes, muscle tone coordination. No cranial nerve deficit. Skin: Skin is warm and dry.  Psychiatric: Normal mood and affect. Behavior, judgment, thought content normal.     Data Reviewed: I have personally reviewed following labs and imaging studies  CBC:  Recent Labs Lab 12/28/16 0823 12/29/16 0757 12/31/16 1251 01/01/17 0425 01/02/17 0404  WBC 12.4* 13.2* 14.5* 14.0* 12.2*  HGB 9.1* 9.2*  10.1* 9.3* 8.6*  HCT 26.7* 27.1* 29.7* 28.0* 25.6*  MCV 85.6 85.2 83.7 85.1 85.9  PLT 302 355 462* 439* 409*   Basic Metabolic Panel:  Recent Labs Lab 12/28/16 0823 12/29/16 0757 12/31/16 1251 01/01/17 0425 01/02/17 0404  NA 134* 137 140 141 140  K 3.7 3.7 3.7 3.5 3.4*  CL 104 104 104 109 110  CO2 GLUCOSE 157* 185* 130* 128* 122*  BUN 16 14 23* 18 11  CREATININE 1.01* 0.93 1.12* 0.86 0.85  CALCIUM 8.0* 8.2* 8.7* 8.0* 7.8*  MG  --   --   --   --  1.6*   GFR: Estimated Creatinine Clearance: 81.3 mL/min (by C-G formula based on SCr of 0.85 mg/dL). Liver Function Tests:  Recent Labs Lab 12/31/16 1251  AST 30  ALT 23  ALKPHOS 96  BILITOT 1.2  PROT  7.2  ALBUMIN 3.4*    Recent Labs Lab 12/31/16 1251  LIPASE 28   No results for input(s): AMMONIA in the last 168 hours. Coagulation Profile:  Recent Labs Lab 12/31/16 2215  INR 1.10   Cardiac Enzymes: No results for input(s): CKTOTAL, CKMB, CKMBINDEX, TROPONINI in the last 168 hours. BNP (last 3 results) No results for input(s): PROBNP in the last 8760 hours. HbA1C: No results for input(s): HGBA1C in the last 72 hours. CBG:  Recent Labs Lab 01/01/17 1118 01/01/17 1657 01/01/17 2211 01/02/17 0715 01/02/17 1114  GLUCAP 99 130* 150* 117* 162*   Lipid Profile: No results for input(s): CHOL, HDL, LDLCALC, TRIG, CHOLHDL, LDLDIRECT in the last 72 hours. Thyroid Function Tests: No results for input(s): TSH, T4TOTAL, FREET4, T3FREE, THYROIDAB in the last 72 hours. Anemia Panel: No results for input(s): VITAMINB12, FOLATE, FERRITIN, TIBC, IRON, RETICCTPCT in the last 72 hours. Urine analysis:    Component Value Date/Time   COLORURINE AMBER (A) 12/31/2016 1300   APPEARANCEUR HAZY (A) 12/31/2016 1300   LABSPEC 1.026 12/31/2016 1300   PHURINE 5.0 12/31/2016 1300   GLUCOSEU NEGATIVE 12/31/2016 1300   HGBUR MODERATE (A) 12/31/2016 1300   BILIRUBINUR SMALL (A) 12/31/2016 1300   KETONESUR 80 (A) 12/31/2016 1300   PROTEINUR >=300 (A) 12/31/2016 1300   NITRITE NEGATIVE 12/31/2016 1300   LEUKOCYTESUR NEGATIVE 12/31/2016 1300   Sepsis Labs: (procalcitonin:4,lacticidven:4)   ) Recent Results (from the past 240 hour(s))  Surgical PCR screen     Status: None   Collection Time: 12/23/16  3:20 PM  Result Value Ref Range Status   MRSA, PCR NEGATIVE NEGATIVE Final   Staphylococcus aureus NEGATIVE NEGATIVE Final    Comment: (NOTE) The Xpert SA Assay (FDA approved for NASAL specimens in patients 21 years of age and older), is one component of a comprehensive surveillance program. It is not intended to diagnose infection nor to guide or monitor treatment.        Radiology Studies: Ct Abdomen Pelvis W Contrast  Result Date: 12/31/2016 CLINICAL DATA:  Nausea and emesis.  Cholecystectomy 12/23/2016. EXAM: CT ABDOMEN AND PELVIS WITH CONTRAST TECHNIQUE: Multidetector CT imaging of the abdomen and pelvis was performed using the standard protocol following bolus administration of intravenous contrast. CONTRAST:  100 cc ISOVUE-300 IOPAMIDOL (ISOVUE-300) INJECTION 61% COMPARISON:  12/25/2016 CT FINDINGS: Lower chest: Clearing of small right effusion. Normal sized heart. No acute pulmonary abnormality. Hepatobiliary: Status post cholecystectomy. Complex hemorrhagic fluid is again seen tracking from the gallbladder fossa into the subhepatic space and right paracolic gutter with small volume again noted in the pelvis.  Given administration of IV contrast, this fluid is better visualized on today's study and does not appear to have significantly progressed. No abscess is noted. Resolution of subdiaphragmatic fluid since prior as well as pneumobilia. Pancreas: Normal Spleen: Normal Adrenals/Urinary Tract: Normal bilateral adrenal glands. No obstructive uropathy or renal calculi. 6 mm hypodensity in the interpolar right kidney is noted statistically consistent with a cyst but too small to further characterize. The urinary bladder is unremarkable. Stomach/Bowel: Fluid-filled distention of the stomach. Mild fluid-filled distention of jejunal loops in the left upper quadrant, query mild enteritis. No mechanical bowel obstruction. The appendix and large intestine is unremarkable. Vascular/Lymphatic: No significant vascular findings are present. No enlarged abdominal or pelvic lymph nodes. Reproductive: Fibroid uterus. Largest calcified uterine fibroid is seen on the right estimated at 2.4 cm in maximum dimension currently. No adnexal mass. Other: Postop induration at sites of laparoscopic ports in the lower abdomen and umbilicus with subcutaneous emphysema adjacent to the umbilicus  and within the umbilicus. Musculoskeletal: No acute or significant osseous findings. IMPRESSION: 1. Persistent complex fluid is seen tracking from the gallbladder fossa into the pelvis along the subhepatic space and right paracolic gutter consistent with known hemoperitoneum. This fluid is better visualized on today's study given administration of intravenous contrast medium. Allowing for differences in technique, no significant progression in size is appreciated of this fluid. No enhancing collections are noted to suggest an abscess. 2. Mild fluid-filled distention of left upper quadrant small bowel loops query enteritis or possibly localized mild small bowel ileus. 3. Interval clearing of right effusion, pneumobilia and most of the subcutaneous emphysema since prior exam. Residual periumbilical soft tissue emphysema persists. 4. Fibroid uterus. 5. Subcentimeter right renal cyst too small to further characterize. Electronically Signed   By: Tollie Eth M.D.   On: 12/31/2016 20:33   Dg Abd Acute W/chest  Result Date: 12/31/2016 CLINICAL DATA:  Emesis since cholecystectomy 12/29/2016. Shortness of breath. EXAM: DG ABDOMEN ACUTE W/ 1V CHEST COMPARISON:  Chest x-ray 01/03/2016 and CT 12/25/2016 FINDINGS: Lungs are hypoinflated and otherwise clear. Cardiomediastinal silhouette and remainder of the chest is within normal. Abdominopelvic images demonstrate a nonobstructive bowel gas pattern. No evidence of free peritoneal air. Evidence of known calcified uterine fibroid. Few pelvic phleboliths. Remaining bony structures are normal. IMPRESSION: Negative abdominal radiographs.  No acute cardiopulmonary disease. Electronically Signed   By: Elberta Fortis M.D.   On: 12/31/2016 16:36        Scheduled Meds: . diltiazem  120 mg Oral Daily  . docusate sodium  100 mg Oral BID  . feeding supplement  237 mL Oral BID BM  . flecainide  100 mg Oral BID  . insulin aspart  0-5 Units Subcutaneous QHS  . insulin aspart  0-9  Units Subcutaneous TID WC  . lip balm  1 application Topical BID  . metoprolol tartrate  25 mg Oral BID  . potassium chloride  40 mEq Oral Daily  . saccharomyces boulardii  250 mg Oral BID  . sodium chloride flush  3 mL Intravenous Q12H   Continuous Infusions: . sodium chloride    . famotidine (PEPCID) IV Stopped (01/02/17 0949)  . lactated ringers    . methocarbamol (ROBAXIN)  IV       LOS: 2 days    Time spent: 25 minutes  Greater than 50% of the time spent on counseling and coordinating the care.   Manson Passey, MD [facility] Pager [phone]  If 7PM-7AM, please contact night-coverage [email or web address] Password  [email or web address] 01/02/2017, 1:05 PM

## 2017-01-02 NOTE — Progress Notes (Signed)
Patient tolerated full liquid diet well- ate pudding and soup with no complications. Pt is also passing flatus and had a BM. Diet advanced to Heart Healthy Diet per order. Graham crackers and ice cream given.

## 2017-01-02 NOTE — Progress Notes (Signed)
[address] [place], [place] [address] Phone: [phone]  FAX: [place] [phone] [birth year]  CARE TEAM:  PCP: Foye Spurling, MD  Outpatient Care Team: Patient Care Team: Foye Spurling, MD as PCP - General (Internal Medicine)  Inpatient Treatment Team: Treatment Team: Attending Provider: Robbie Lis, MD; Consulting Physician: Edison Pace, Md, MD; Rounding Team: Threasa Beards, MD; Technician: Abe People, [place]; Registered Nurse: Con Memos, RN; Registered Nurse: Ananias Pilgrim, RN   Problem List:   Principal Problem:   Nausea & vomiting Active Problems:   Diabetes mellitus without complication (HCC)   Paroxysmal atrial fibrillation ([place])   Asthma   SIRS (systemic inflammatory response syndrome) ([place])   Hemoperitoneum   AKI (acute kidney injury) ([place])           Assessment  Improving  Plan:  -Full liquid diet, advance to soft as tolerated. -Correct hypokalemia - check magnesium. -Continue supportive care for nausea and pain -VTE prophylaxis- SCDs, etc -Mobilize as tolerated to help recovery  15 minutes spent in review, evaluation, examination, counseling, and coordination of care.  More than 50% of that time was spent in counseling.    01/02/2017    Subjective: (Chief complaint)  Patient states she tolerated clear liquids yesterday. 1 episode of emesis this morning, felt it was because "the broth was greasy". Denies nausea, abdominal pain.  Enema done yesterday. Walking on floor. Would like to advance her diet.  Objective:  Vital signs:  Vitals:   01/01/17 1120 01/01/17 1321 01/01/17 2159 01/02/17 0521  BP: (!) 144/67 (!) 174/66 132/65 129/64  Pulse: 99 94 98 80  Resp:  _0 Temp:  97.8 F (36.6 C) 98.2 F (36.8 C) 98.6 F (37 C)  TempSrc:  Oral Oral Oral  SpO2:  94% 98% 100%  Weight:      Height:        Last BM Date:  01/01/17  Intake/Output   Yesterday:  09/28 0701 - 09/29 0700 In: 2720 [P.O.:120; I.V.:2500; IV Piggyback:100] Out: -  This shift:  No intake/output data recorded.  Bowel function:  Flatus: YES - with enema  BM:  YES  Drain: (No drain)   Physical Exam:  General: Pt awake/alert/oriented x4 in no acute distress Eyes: PERRL, normal EOM.  Sclera clear.  No icterus Neuro: CN II-XII intact w/o focal sensory/motor deficits. Lymph: No head/neck/groin lymphadenopathy Psych:  No delerium/psychosis/paranoia HENT: Normocephalic, Mucus membranes moist.  No thrush Neck: Supple, No tracheal deviation Chest:  No chest wall pain w good excursion CV:  Pulses intact.  Regular rhythm MS: Normal AROM mjr joints.  No obvious deformity  Abdomen: Soft.  Nondistended.  Nontender.  No evidence of peritonitis.  No incarcerated hernias.  Ext:   No deformity.  No mjr edema.  No cyanosis Skin: No petechiae / purpura  Results:   Labs: Results for orders placed or performed during the hospital encounter of 12/31/16 (from the past 48 hour(s))  CBG monitoring, ED     Status: Abnormal   Collection Time: 12/31/16 12:38 PM  Result Value Ref Range   Glucose-Capillary 118 (H) 65 - 99 mg/dL  Lipase, blood     Status: None   Collection Time: 12/31/16 12:51 PM  Result Value Ref Range   Lipase 28 11 - 51 U/L  Comprehensive metabolic panel     Status: Abnormal   Collection Time: 12/31/16 12:51  PM  Result Value Ref Range   Sodium 140 135 - 145 mmol/L   Potassium 3.7 3.5 - 5.1 mmol/L   Chloride 104 101 - 111 mmol/L   CO2 22 22 - 32 mmol/L   Glucose, Bld 130 (H) 65 - 99 mg/dL   BUN 23 (H) 6 - 20 mg/dL   Creatinine, Ser 1.12 (H) 0.44 - 1.00 mg/dL   Calcium 8.7 (L) 8.9 - 10.3 mg/dL   Total Protein 7.2 6.5 - 8.1 g/dL   Albumin 3.4 (L) 3.5 - 5.0 g/dL   AST 30 15 - 41 U/L   ALT 23 14 - 54 U/L   Alkaline Phosphatase 96 38 - 126 U/L   Total Bilirubin 1.2 0.3 - 1.2 mg/dL   GFR calc non Af Amer 55 (L) >60  mL/min   GFR calc Af Amer >60 >60 mL/min    Comment: (NOTE) The eGFR has been calculated using the CKD EPI equation. This calculation has not been validated in all clinical situations. eGFR's persistently <60 mL/min signify possible Chronic Kidney Disease.    Anion gap 14 5 - 15  CBC     Status: Abnormal   Collection Time: 12/31/16 12:51 PM  Result Value Ref Range   WBC 14.5 (H) 4.0 - 10.5 K/uL   RBC 3.55 (L) 3.87 - 5.11 MIL/uL   Hemoglobin 10.1 (L) 12.0 - 15.0 g/dL   HCT 29.7 (L) 36.0 - 46.0 %   MCV 83.7 78.0 - 100.0 fL   MCH 28.5 26.0 - 34.0 pg   MCHC 34.0 30.0 - 36.0 g/dL   RDW 14.3 11.5 - 15.5 %   Platelets 462 (H) 150 - 400 K/uL  Urinalysis, Routine w reflex microscopic     Status: Abnormal   Collection Time: 12/31/16  1:00 PM  Result Value Ref Range   Color, Urine AMBER (A) YELLOW    Comment: BIOCHEMICALS MAY BE AFFECTED BY COLOR   APPearance HAZY (A) CLEAR   Specific Gravity, Urine 1.026 1.005 - 1.030   pH 5.0 5.0 - 8.0   Glucose, UA NEGATIVE NEGATIVE mg/dL   Hgb urine dipstick MODERATE (A) NEGATIVE   Bilirubin Urine SMALL (A) NEGATIVE   Ketones, ur 80 (A) NEGATIVE mg/dL   Protein, ur >=300 (A) NEGATIVE mg/dL   Nitrite NEGATIVE NEGATIVE   Leukocytes, UA NEGATIVE NEGATIVE   RBC / HPF 0-5 0 - 5 RBC/hpf   WBC, UA 6-30 0 - 5 WBC/hpf   Bacteria, UA RARE (A) NONE SEEN   Squamous Epithelial / LPF 6-30 (A) NONE SEEN   Mucus PRESENT    Hyaline Casts, UA PRESENT    Cellular Cast, UA 3   Protime-INR     Status: None   Collection Time: 12/31/16 10:15 PM  Result Value Ref Range   Prothrombin Time 14.1 11.4 - 15.2 seconds   INR 1.10   APTT     Status: None   Collection Time: 12/31/16 10:15 PM  Result Value Ref Range   aPTT 27 24 - 36 seconds  Lactic acid, plasma     Status: None   Collection Time: 12/31/16 10:15 PM  Result Value Ref Range   Lactic Acid, Venous 1.2 0.5 - 1.9 mmol/L  Procalcitonin     Status: None   Collection Time: 12/31/16 10:15 PM  Result Value Ref  Range   Procalcitonin <0.10 ng/mL    Comment:        Interpretation: PCT (Procalcitonin) <= 0.5 ng/mL: Systemic infection (sepsis) is not  likely. Local bacterial infection is possible. (NOTE)         ICU PCT Algorithm               Non ICU PCT Algorithm    ----------------------------     ------------------------------         PCT < 0.25 ng/mL                 PCT < 0.1 ng/mL     Stopping of antibiotics            Stopping of antibiotics       strongly encouraged.               strongly encouraged.    ----------------------------     ------------------------------       PCT level decrease by               PCT < 0.25 ng/mL       >= 80% from peak PCT       OR PCT 0.25 - 0.5 ng/mL          Stopping of antibiotics                                             encouraged.     Stopping of antibiotics           encouraged.    ----------------------------     ------------------------------       PCT level decrease by              PCT >= 0.25 ng/mL       < 80% from peak PCT        AND PCT >= 0.5 ng/mL            Continuin g antibiotics                                              encouraged.       Continuing antibiotics            encouraged.    ----------------------------     ------------------------------     PCT level increase compared          PCT > 0.5 ng/mL         with peak PCT AND          PCT >= 0.5 ng/mL             Escalation of antibiotics                                          strongly encouraged.      Escalation of antibiotics        strongly encouraged.   CBG monitoring, ED     Status: Abnormal   Collection Time: 01/01/17 12:28 AM  Result Value Ref Range   Glucose-Capillary 108 (H) 65 - 99 mg/dL  Basic metabolic panel     Status: Abnormal   Collection Time: 01/01/17  4:25 AM  Result Value Ref Range   Sodium 141 135 - 145 mmol/L   Potassium 3.5 3.5 - 5.1 mmol/L   Chloride  109 101 - 111 mmol/L   CO2 22 22 - 32 mmol/L   Glucose, Bld 128 (H) 65 - 99 mg/dL   BUN 18 6  - 20 mg/dL   Creatinine, Ser 0.86 0.44 - 1.00 mg/dL   Calcium 8.0 (L) 8.9 - 10.3 mg/dL   GFR calc non Af Amer >60 >60 mL/min   GFR calc Af Amer >60 >60 mL/min    Comment: (NOTE) The eGFR has been calculated using the CKD EPI equation. This calculation has not been validated in all clinical situations. eGFR's persistently <60 mL/min signify possible Chronic Kidney Disease.    Anion gap 10 5 - 15  CBC     Status: Abnormal   Collection Time: 01/01/17  4:25 AM  Result Value Ref Range   WBC 14.0 (H) 4.0 - 10.5 K/uL   RBC 3.29 (L) 3.87 - 5.11 MIL/uL   Hemoglobin 9.3 (L) 12.0 - 15.0 g/dL   HCT 28.0 (L) 36.0 - 46.0 %   MCV 85.1 78.0 - 100.0 fL   MCH 28.3 26.0 - 34.0 pg   MCHC 33.2 30.0 - 36.0 g/dL   RDW 14.5 11.5 - 15.5 %   Platelets 439 (H) 150 - 400 K/uL  Glucose, capillary     Status: Abnormal   Collection Time: 01/01/17  7:28 AM  Result Value Ref Range   Glucose-Capillary 130 (H) 65 - 99 mg/dL  Glucose, capillary     Status: None   Collection Time: 01/01/17 11:18 AM  Result Value Ref Range   Glucose-Capillary 99 65 - 99 mg/dL   Comment 1 Notify RN   Glucose, capillary     Status: Abnormal   Collection Time: 01/01/17  4:57 PM  Result Value Ref Range   Glucose-Capillary 130 (H) 65 - 99 mg/dL  Glucose, capillary     Status: Abnormal   Collection Time: 01/01/17 10:11 PM  Result Value Ref Range   Glucose-Capillary 150 (H) 65 - 99 mg/dL  CBC     Status: Abnormal   Collection Time: 01/02/17  4:04 AM  Result Value Ref Range   WBC 12.2 (H) 4.0 - 10.5 K/uL   RBC 2.98 (L) 3.87 - 5.11 MIL/uL   Hemoglobin 8.6 (L) 12.0 - 15.0 g/dL   HCT 25.6 (L) 36.0 - 46.0 %   MCV 85.9 78.0 - 100.0 fL   MCH 28.9 26.0 - 34.0 pg   MCHC 33.6 30.0 - 36.0 g/dL   RDW 14.6 11.5 - 15.5 %   Platelets 409 (H) 150 - 400 K/uL  Basic metabolic panel     Status: Abnormal   Collection Time: 01/02/17  4:04 AM  Result Value Ref Range   Sodium 140 135 - 145 mmol/L   Potassium 3.4 (L) 3.5 - 5.1 mmol/L    Chloride 110 101 - 111 mmol/L   CO2 24 22 - 32 mmol/L   Glucose, Bld 122 (H) 65 - 99 mg/dL   BUN 11 6 - 20 mg/dL   Creatinine, Ser 0.85 0.44 - 1.00 mg/dL   Calcium 7.8 (L) 8.9 - 10.3 mg/dL   GFR calc non Af Amer >60 >60 mL/min   GFR calc Af Amer >60 >60 mL/min    Comment: (NOTE) The eGFR has been calculated using the CKD EPI equation. This calculation has not been validated in all clinical situations. eGFR's persistently <60 mL/min signify possible Chronic Kidney Disease.    Anion gap 6 5 - 15  Glucose, capillary     Status: Abnormal  Collection Time: 01/02/17  7:15 AM  Result Value Ref Range   Glucose-Capillary 117 (H) 65 - 99 mg/dL    Imaging / Studies: Ct Abdomen Pelvis W Contrast  Result Date: 12/31/2016 CLINICAL DATA:  Nausea and emesis.  Cholecystectomy 12/23/2016. EXAM: CT ABDOMEN AND PELVIS WITH CONTRAST TECHNIQUE: Multidetector CT imaging of the abdomen and pelvis was performed using the standard protocol following bolus administration of intravenous contrast. CONTRAST:  100 cc ISOVUE-300 IOPAMIDOL (ISOVUE-300) INJECTION 61% COMPARISON:  12/25/2016 CT FINDINGS: Lower chest: Clearing of small right effusion. Normal sized heart. No acute pulmonary abnormality. Hepatobiliary: Status post cholecystectomy. Complex hemorrhagic fluid is again seen tracking from the gallbladder fossa into the subhepatic space and right paracolic gutter with small volume again noted in the pelvis. Given administration of IV contrast, this fluid is better visualized on today's study and does not appear to have significantly progressed. No abscess is noted. Resolution of subdiaphragmatic fluid since prior as well as pneumobilia. Pancreas: Normal Spleen: Normal Adrenals/Urinary Tract: Normal bilateral adrenal glands. No obstructive uropathy or renal calculi. 6 mm hypodensity in the interpolar right kidney is noted statistically consistent with a cyst but too small to further characterize. The urinary bladder is  unremarkable. Stomach/Bowel: Fluid-filled distention of the stomach. Mild fluid-filled distention of jejunal loops in the left upper quadrant, query mild enteritis. No mechanical bowel obstruction. The appendix and large intestine is unremarkable. Vascular/Lymphatic: No significant vascular findings are present. No enlarged abdominal or pelvic lymph nodes. Reproductive: Fibroid uterus. Largest calcified uterine fibroid is seen on the right estimated at 2.4 cm in maximum dimension currently. No adnexal mass. Other: Postop induration at sites of laparoscopic ports in the lower abdomen and umbilicus with subcutaneous emphysema adjacent to the umbilicus and within the umbilicus. Musculoskeletal: No acute or significant osseous findings. IMPRESSION: 1. Persistent complex fluid is seen tracking from the gallbladder fossa into the pelvis along the subhepatic space and right paracolic gutter consistent with known hemoperitoneum. This fluid is better visualized on today's study given administration of intravenous contrast medium. Allowing for differences in technique, no significant progression in size is appreciated of this fluid. No enhancing collections are noted to suggest an abscess. 2. Mild fluid-filled distention of left upper quadrant small bowel loops query enteritis or possibly localized mild small bowel ileus. 3. Interval clearing of right effusion, pneumobilia and most of the subcutaneous emphysema since prior exam. Residual periumbilical soft tissue emphysema persists. 4. Fibroid uterus. 5. Subcentimeter right renal cyst too small to further characterize. Electronically Signed   By: Ashley Royalty M.D.   On: 12/31/2016 20:33   Dg Abd Acute W/chest  Result Date: 12/31/2016 CLINICAL DATA:  Emesis since cholecystectomy 12/29/2016. Shortness of breath. EXAM: DG ABDOMEN ACUTE W/ 1V CHEST COMPARISON:  Chest x-ray 01/03/2016 and CT 12/25/2016 FINDINGS: Lungs are hypoinflated and otherwise clear. Cardiomediastinal  silhouette and remainder of the chest is within normal. Abdominopelvic images demonstrate a nonobstructive bowel gas pattern. No evidence of free peritoneal air. Evidence of known calcified uterine fibroid. Few pelvic phleboliths. Remaining bony structures are normal. IMPRESSION: Negative abdominal radiographs.  No acute cardiopulmonary disease. Electronically Signed   By: Marin Olp M.D.   On: 12/31/2016 16:36    Medications / Allergies: per chart  Antibiotics: Anti-infectives    None        Note: Portions of this report may have been transcribed using voice recognition software. Every effort was made to ensure accuracy; however, inadvertent computerized transcription errors may be present.   Any  transcriptional errors that result from this process are unintentional.     [place] Hall-[place]vin, PA-S  01/02/2017

## 2017-01-03 DIAGNOSIS — R112 Nausea with vomiting, unspecified: Secondary | ICD-10-CM | POA: Diagnosis not present

## 2017-01-03 LAB — BASIC METABOLIC PANEL
Anion gap: 6 (ref 5–15)
BUN: 10 mg/dL (ref 6–20)
CO2: 23 mmol/L (ref 22–32)
Calcium: 7.9 mg/dL — ABNORMAL LOW (ref 8.9–10.3)
Chloride: 108 mmol/L (ref 101–111)
Creatinine, Ser: 0.91 mg/dL (ref 0.44–1.00)
GFR calc Af Amer: >60 mL/min (ref >60)
GFR calc non Af Amer: >60 mL/min (ref >60)
Glucose, Bld: 134 mg/dL — ABNORMAL HIGH (ref 65–99)
Potassium: 3.7 mmol/L (ref 3.5–5.1)
Sodium: 137 mmol/L (ref 135–145)

## 2017-01-03 LAB — CBC
HCT: 25.8 % — ABNORMAL LOW (ref 36.0–46.0)
Hemoglobin: 8.6 g/dL — ABNORMAL LOW (ref 12.0–15.0)
MCH: 28.7 pg (ref 26.0–34.0)
MCHC: 33.3 g/dL (ref 30.0–36.0)
MCV: 86 fL (ref 78.0–100.0)
Platelets: 420 10*3/uL — ABNORMAL HIGH (ref 150–400)
RBC: 3 MIL/uL — ABNORMAL LOW (ref 3.87–5.11)
RDW: 14.4 % (ref 11.5–15.5)
WBC: 11.1 10*3/uL — ABNORMAL HIGH (ref 4.0–10.5)

## 2017-01-03 NOTE — Discharge Instructions (Signed)
LAPAROSCOPIC SURGERY: POST OP INSTRUCTIONS  ######################################################################  EAT Gradually transition to a high fiber diet with a fiber supplement over the next few weeks after discharge.  Start with a pureed / full liquid diet (see below)  WALK Walk an hour a day.  Control your pain to do that.    CONTROL PAIN Control pain so that you can walk, sleep, tolerate sneezing/coughing, go up/down stairs.  HAVE A BOWEL MOVEMENT DAILY Keep your bowels regular to avoid problems.  OK to try a laxative to override constipation.  OK to use an antidairrheal to slow down diarrhea.  Call if not better after 2 tries  CALL IF YOU HAVE PROBLEMS/CONCERNS Call if you are still struggling despite following these instructions. Call if you have concerns not answered by these instructions  ######################################################################    1. DIET: Follow a light bland diet the first 24 hours after arrival home, such as soup, liquids, crackers, etc.  Be sure to include lots of fluids daily.  Avoid fast food or heavy meals as your are more likely to get nauseated.  Eat a low fat the next few days after surgery.   2. Take your usually prescribed home medications unless otherwise directed. 3. PAIN CONTROL: a. Pain is best controlled by a usual combination of three different methods TOGETHER: i. Ice/Heat ii. Over the counter pain medication iii. Prescription pain medication b. Most patients will experience some swelling and bruising around the incisions.  Ice packs or heating pads (30-60 minutes up to 6 times a day) will help. Use ice for the first few days to help decrease swelling and bruising, then switch to heat to help relax tight/sore spots and speed recovery.  Some people prefer to use ice alone, heat alone, alternating between ice & heat.  Experiment to what works for you.  Swelling and bruising can take several weeks to resolve.   c. It is  helpful to take an over-the-counter pain medication regularly for the first few weeks.  Choose one of the following that works best for you: i. Naproxen (Aleve, etc)  Two 251m tabs twice a day ii. Ibuprofen (Advil, etc) Three 2053mtabs four times a day (every meal & bedtime) iii. Acetaminophen (Tylenol, etc) 500-65044mour times a day (every meal & bedtime) d. A  prescription for pain medication (such as oxycodone, hydrocodone, etc) should be given to you upon discharge.  Take your pain medication as prescribed.  i. If you are having problems/concerns with the prescription medicine (does not control pain, nausea, vomiting, rash, itching, etc), please call us [phone] see if we need to switch you to a different pain medicine that will work better for you and/or control your side effect better. ii. If you need a refill on your pain medication, please contact your pharmacy.  They will contact our office to request authorization. Prescriptions will not be filled after 5 pm or on week-ends. 4. Avoid getting constipated.  Between the surgery and the pain medications, it is common to experience some constipation.  Increasing fluid intake and taking a fiber supplement (such as Metamucil, Citrucel, FiberCon, MiraLax, etc) 1-2 times a day regularly will usually help prevent this problem from occurring.  A mild laxative (prune juice, Milk of Magnesia, MiraLax, etc) should be taken according to package directions if there are no bowel movements after 48 hours.   5. Watch out for diarrhea.  If you have many loose bowel movements, simplify your diet to bland foods & liquids for  a few days.  Stop any stool softeners and decrease your fiber supplement.  Switching to mild anti-diarrheal medications (Kayopectate, Pepto Bismol) can help.  If this worsens or does not improve, please call us. 6. Wash / shower every day.  You may shower over the dressings as they are waterproof.  Continue to shower over incision(s)  after the dressing is off. 7. Remove your waterproof bandages 5 days after surgery.  You may leave the incision open to air.  You may replace a dressing/Band-Aid to cover the incision for comfort if you wish.  8. ACTIVITIES as tolerated:   a. You may resume regular (light) daily activities beginning the next day--such as daily self-care, walking, climbing stairs--gradually increasing activities as tolerated.  If you can walk 30 minutes without difficulty, it is safe to try more intense activity such as jogging, treadmill, bicycling, low-impact aerobics, swimming, etc. b. Save the most intensive and strenuous activity for last such as sit-ups, heavy lifting, contact sports, etc  Refrain from any heavy lifting or straining until you are off narcotics for pain control.   c. DO NOT PUSH THROUGH PAIN.  Let pain be your guide: If it hurts to do something, don't do it.  Pain is your body warning you to avoid that activity for another week until the pain goes down. d. You may drive when you are no longer taking prescription pain medication, you can comfortably wear a seatbelt, and you can safely maneuver your car and apply brakes. e. Bonita Quin may have sexual intercourse when it is comfortable.  9. FOLLOW UP in our office a. Please call [facility] at [phone] to set up an appointment to see your surgeon in the office for a follow-up appointment approximately 2-3 weeks after your surgery. b. Make sure that you call for this appointment the day you arrive home to insure a convenient appointment time. 10. IF YOU HAVE DISABILITY OR FAMILY LEAVE FORMS, BRING THEM TO THE OFFICE FOR PROCESSING.  DO NOT GIVE THEM TO YOUR DOCTOR.   WHEN TO CALL us [phone]: 1. Poor pain control 2. Reactions / problems with new medications (rash/itching, nausea, etc)  3. Fever over 101.5 F (38.5 C) 4. Inability to urinate 5. Nausea and/or vomiting 6. Worsening swelling or bruising 7. Continued bleeding from incision. 8. Increased  pain, redness, or drainage from the incision   The clinic staff is available to answer your questions during regular business hours (8:30am-5pm).  Please dont hesitate to call and ask to speak to one of our nurses for clinical concerns.   If you have a medical emergency, go to the nearest emergency room or call 911.  A surgeon from [facility] is always on call at the [facility], [place] [address], [address], [place], [place]  [address] ? MAIN: [phone] ? TOLL FREE: [phone] ?  FAX [phone] [email or web address]   GETTING TO GOOD BOWEL HEALTH.  ######################################################################  EAT Gradually transition to a high fiber diet with a fiber supplement over the next few weeks after discharge.  Start with a pureed / full liquid diet (see below)  WALK Walk an hour a day.  Control your pain to do that.    HAVE A BOWEL MOVEMENT DAILY Keep your bowels regular to avoid problems.  OK to try a laxative to override constipation.  OK to use an antidairrheal to slow down diarrhea.  Call if not better after 2 tries  CALL IF YOU  HAVE PROBLEMS/CONCERNS Call if you are still struggling despite following these instructions. Call if you have concerns not answered by these instructions  ######################################################################   Irregular bowel habits such as constipation and diarrhea can lead to many problems over time.  Having one soft bowel movement a day is the most important way to prevent further problems.  The anorectal canal is designed to handle stretching and feces to safely manage our ability to get rid of solid waste (feces, poop, stool) out of our body.  BUT, hard constipated stools can act like ripping concrete bricks and diarrhea can be a burning fire to this very sensitive area of our body, causing inflamed hemorrhoids, anal fissures, increasing risk is  perirectal abscesses, abdominal pain/bloating, an making irritable bowel worse.      The goal: ONE SOFT BOWEL MOVEMENT A DAY!  To have soft, regular bowel movements:   Drink plenty of fluids, consider 4-6 tall glasses of water a day.    Take plenty of fiber.  Fiber is the undigested part of plant food that passes into the colon, acting s natures broom to encourage bowel motility and movement.  Fiber can absorb and hold large amounts of water. This results in a larger, bulkier stool, which is soft and easier to pass. Work gradually over several weeks up to 6 servings a day of fiber (25g a day even more if needed) in the form of: o Vegetables -- Root (potatoes, carrots, turnips), leafy green (lettuce, salad greens, celery, spinach), or cooked high residue (cabbage, broccoli, etc) o Fruit -- Fresh (unpeeled skin & pulp), Dried (prunes, apricots, cherries, etc ),  or stewed ( applesauce)  o Whole grain breads, pasta, etc (whole wheat)  o Bran cereals   Bulking Agents -- This type of water-retaining fiber generally is easily obtained each day by one of the following:  o Psyllium bran -- The psyllium plant is remarkable because its ground seeds can retain so much water. This product is available as Metamucil, Konsyl, Effersyllium, Per Diem Fiber, or the less expensive generic preparation in drug and health food stores. Although labeled a laxative, it really is not a laxative.  o Methylcellulose -- This is another fiber derived from wood which also retains water. It is available as Citrucel. o Polyethylene Glycol - and artificial fiber commonly called Miralax or Glycolax.  It is helpful for people with gassy or bloated feelings with regular fiber o Flax Seed - a less gassy fiber than psyllium  No reading or other relaxing activity while on the toilet. If bowel movements take longer than 5 minutes, you are too constipated  AVOID CONSTIPATION.  High fiber and water intake usually takes care of this.   Sometimes a laxative is needed to stimulate more frequent bowel movements, but   Laxatives are not a good long-term solution as it can wear the colon out.  They can help jump-start bowels if constipated, but should be relied on constantly without discussing with your doctor o Osmotics (Milk of Magnesia, Fleets phosphosoda, Magnesium citrate, MiraLax, GoLytely) are safer than  o Stimulants (Senokot, Castor Oil, Dulcolax, Ex Lax)    o Avoid taking laxatives for more than 7 days in a row.   IF SEVERELY CONSTIPATED, try a Bowel Retraining Program: o Do not use laxatives.  o Eat a diet high in roughage, such as bran cereals and leafy vegetables.  o Drink six (6) ounces of prune or apricot juice each morning.  o Eat two (2) large servings  of stewed fruit each day.  o Take one (1) heaping tablespoon of a psyllium-based bulking agent twice a day. Use sugar-free sweetener when possible to avoid excessive calories.  o Eat a normal breakfast.  o Set aside 15 minutes after breakfast to sit on the toilet, but do not strain to have a bowel movement.  o If you do not have a bowel movement by the third day, use an enema and repeat the above steps.   Controlling diarrhea o Switch to liquids and simpler foods for a few days to avoid stressing your intestines further. o Avoid dairy products (especially milk & ice cream) for a short time.  The intestines often can lose the ability to digest lactose when stressed. o Avoid foods that cause gassiness or bloating.  Typical foods include beans and other legumes, cabbage, broccoli, and dairy foods.  Every person has some sensitivity to other foods, so listen to our body and avoid those foods that trigger problems for you. o Adding fiber (Citrucel, Metamucil, psyllium, Miralax) gradually can help thicken stools by absorbing excess fluid and retrain the intestines to act more normally.  Slowly increase the dose over a few weeks.  Too much fiber too soon can backfire and  cause cramping & bloating. o Probiotics (such as active yogurt, Align, etc) may help repopulate the intestines and colon with normal bacteria and calm down a sensitive digestive tract.  Most studies show it to be of mild help, though, and such products can be costly. o Medicines: - Bismuth subsalicylate (ex. Kayopectate, Pepto Bismol) every 30 minutes for up to 6 doses can help control diarrhea.  Avoid if pregnant. - Loperamide (Immodium) can slow down diarrhea.  Start with two tablets (  total) first and then try one tablet every 6 hours.  Avoid if you are having fevers or severe pain.  If you are not better or start feeling worse, stop all medicines and call your doctor for advice o Call your doctor if you are getting worse or not better.  Sometimes further testing (cultures, endoscopy, X-ray studies, bloodwork, etc) may be needed to help diagnose and treat the cause of the diarrhea.  TROUBLESHOOTING IRREGULAR BOWELS 1) Avoid extremes of bowel movements (no bad constipation/diarrhea) 2) Miralax 17gm mixed in 8oz. water or juice-daily. May use BID as needed.  3) Gas-x,Phazyme, etc. as needed for gas & bloating.  4) Soft,bland diet. No spicy,greasy,fried foods.  5) Prilosec over-the-counter as needed  6) May hold gluten/wheat products from diet to see if symptoms improve.  7)  May try probiotics (Align, Activa, etc) to help calm the bowels down 7) If symptoms become worse call back immediately.

## 2017-01-03 NOTE — Progress Notes (Signed)
[address] [place], [place] [address] Phone: [phone]  FAX: [place] [phone] [birth year]  CARE TEAM:  PCP: Foye Spurling, MD  Outpatient Care Team: Patient Care Team: Foye Spurling, MD as PCP - General (Internal Medicine)  Inpatient Treatment Team: Treatment Team: Attending Provider: Robbie Lis, MD; Consulting Physician: Edison Pace, Md, MD; Rounding Team: Threasa Beards, MD; Technician: Abe People, [place]; Registered Nurse: Con Memos, RN; Registered Nurse: Ananias Pilgrim, RN   Problem List:   Principal Problem:   Nausea & vomiting Active Problems:   Diabetes mellitus without complication (HCC)   Paroxysmal atrial fibrillation (HCC)   Asthma   SIRS (systemic inflammatory response syndrome) ([place])   Hemoperitoneum   AKI (acute kidney injury) ([place])   Hypokalemia           Assessment  Improving  Plan:  -Continue adequate nutrition with heart healthy diet -Hypokalemia corrected -Tylenol PRN for pain -Mobilize as tolerated to help recovery -Okay for discharge from general surgery standpoint  15 minutes spent in review, evaluation, examination, counseling, and coordination of care.  More than 50% of that time was spent in counseling.    01/03/2017    Subjective: (Chief complaint)  Tolerating soft foods - denies nausea/vomiting, abdominal pain Walking around in room, to the bathroom. Reports early satiety, appetite and energy returning. Eager to go home.  Objective:  Vital signs:  Vitals:   01/02/17 0521 01/02/17 1439 01/02/17 2203 01/03/17 0645  BP: 129/64 (!) 154/69 107/72 117/69  Pulse: 80 87 83 79  Resp: _0 Temp: 98.6 F (37 C) 98.4 F (36.9 C) 98.1 F (36.7 C) 98.1 F (36.7 C)  TempSrc: Oral Oral Oral Oral  SpO2: 100% 100% 100% 100%  Weight:      Height:        Last BM Date: 01/02/17  Intake/Output    Yesterday:  09/29 0701 - 09/30 0700 In: 40 [P.O.:360; I.V.:410; IV Piggyback:50] Out: -  This shift:  Total I/O In: 240 [P.O.:240] Out: -   Bowel function:  Flatus: YES   BM:  YES  Drain: (No drain)   Physical Exam:  General: Pt awake/alert/oriented x4 in no acute distress Eyes: PERRL, normal EOM.  Sclera clear.  No icterus Neuro: CN II-XII intact w/o focal sensory/motor deficits. Lymph: No head/neck/groin lymphadenopathy Psych:  No delerium/psychosis/paranoia HENT: Normocephalic, Mucus membranes moist.  No thrush Neck: Supple, No tracheal deviation Chest:  No chest wall pain w good excursion CV:  Pulses intact.  Regular rhythm MS: Normal AROM mjr joints.  No obvious deformity  Abdomen: Soft.  Nondistended.  Nontender.  No evidence of peritonitis.  No incarcerated hernias.  Ext:   No deformity.  No mjr edema.  No cyanosis Skin: No petechiae / purpura  Results:   Labs: Results for orders placed or performed during the hospital encounter of 12/31/16 (from the past 48 hour(s))  Glucose, capillary     Status: None   Collection Time: 01/01/17 11:18 AM  Result Value Ref Range   Glucose-Capillary 99 65 - 99 mg/dL   Comment 1 Notify RN   Glucose, capillary     Status: Abnormal   Collection Time: 01/01/17  4:57 PM  Result Value Ref Range   Glucose-Capillary 130 (H) 65 - 99 mg/dL  Glucose, capillary     Status: Abnormal   Collection Time: 01/01/17 10:11 PM  Result  Value Ref Range   Glucose-Capillary 150 (H) 65 - 99 mg/dL  CBC     Status: Abnormal   Collection Time: 01/02/17  4:04 AM  Result Value Ref Range   WBC 12.2 (H) 4.0 - 10.5 K/uL   RBC 2.98 (L) 3.87 - 5.11 MIL/uL   Hemoglobin 8.6 (L) 12.0 - 15.0 g/dL   HCT 25.6 (L) 36.0 - 46.0 %   MCV 85.9 78.0 - 100.0 fL   MCH 28.9 26.0 - 34.0 pg   MCHC 33.6 30.0 - 36.0 g/dL   RDW 14.6 11.5 - 15.5 %   Platelets 409 (H) 150 - 400 K/uL  Basic metabolic panel     Status: Abnormal   Collection Time: 01/02/17  4:04 AM   Result Value Ref Range   Sodium 140 135 - 145 mmol/L   Potassium 3.4 (L) 3.5 - 5.1 mmol/L   Chloride 110 101 - 111 mmol/L   CO2 24 22 - 32 mmol/L   Glucose, Bld 122 (H) 65 - 99 mg/dL   BUN 11 6 - 20 mg/dL   Creatinine, Ser 0.85 0.44 - 1.00 mg/dL   Calcium 7.8 (L) 8.9 - 10.3 mg/dL   GFR calc non Af Amer >60 >60 mL/min   GFR calc Af Amer >60 >60 mL/min    Comment: (NOTE) The eGFR has been calculated using the CKD EPI equation. This calculation has not been validated in all clinical situations. eGFR's persistently <60 mL/min signify possible Chronic Kidney Disease.    Anion gap 6 5 - 15  Magnesium     Status: Abnormal   Collection Time: 01/02/17  4:04 AM  Result Value Ref Range   Magnesium 1.6 (L) 1.7 - 2.4 mg/dL  Glucose, capillary     Status: Abnormal   Collection Time: 01/02/17  7:15 AM  Result Value Ref Range   Glucose-Capillary 117 (H) 65 - 99 mg/dL  Glucose, capillary     Status: Abnormal   Collection Time: 01/02/17 11:14 AM  Result Value Ref Range   Glucose-Capillary 162 (H) 65 - 99 mg/dL   Comment 1 Notify RN   Glucose, capillary     Status: Abnormal   Collection Time: 01/02/17  5:04 PM  Result Value Ref Range   Glucose-Capillary 179 (H) 65 - 99 mg/dL  Glucose, capillary     Status: Abnormal   Collection Time: 01/02/17 10:03 PM  Result Value Ref Range   Glucose-Capillary 226 (H) 65 - 99 mg/dL  CBC     Status: Abnormal   Collection Time: 01/03/17  5:07 AM  Result Value Ref Range   WBC 11.1 (H) 4.0 - 10.5 K/uL   RBC 3.00 (L) 3.87 - 5.11 MIL/uL   Hemoglobin 8.6 (L) 12.0 - 15.0 g/dL   HCT 25.8 (L) 36.0 - 46.0 %   MCV 86.0 78.0 - 100.0 fL   MCH 28.7 26.0 - 34.0 pg   MCHC 33.3 30.0 - 36.0 g/dL   RDW 14.4 11.5 - 15.5 %   Platelets 420 (H) 150 - 400 K/uL  Basic metabolic panel     Status: Abnormal   Collection Time: 01/03/17  5:07 AM  Result Value Ref Range   Sodium 137 135 - 145 mmol/L   Potassium 3.7 3.5 - 5.1 mmol/L   Chloride 108 101 - 111 mmol/L   CO2 23  22 - 32 mmol/L   Glucose, Bld 134 (H) 65 - 99 mg/dL   BUN 10 6 - 20 mg/dL   Creatinine, Ser  0.91 0.44 - 1.00 mg/dL   Calcium 7.9 (L) 8.9 - 10.3 mg/dL   GFR calc non Af Amer >60 >60 mL/min   GFR calc Af Amer >60 >60 mL/min    Comment: (NOTE) The eGFR has been calculated using the CKD EPI equation. This calculation has not been validated in all clinical situations. eGFR's persistently <60 mL/min signify possible Chronic Kidney Disease.    Anion gap 6 5 - 15    Imaging / Studies: No results found.  Medications / Allergies: per chart  Antibiotics: Anti-infectives    None        Note: Portions of this report may have been transcribed using voice recognition software. Every effort was made to ensure accuracy; however, inadvertent computerized transcription errors may be present.   Any transcriptional errors that result from this process are unintentional.     [place] Hall-Potvin, PA-S  01/03/2017

## 2017-01-03 NOTE — Discharge Summary (Addendum)
Physician Discharge Summary  Dawn Braun ZOX:[id number] DOB: 1965/10/06 DOA: 12/31/2016  PCP: Laurena Slimmer, MD  Admit date: 12/31/2016 Discharge date: 01/03/2017  Recommendations for Outpatient Follow-up:  Follow up with surgery per sch appt  Discharge Diagnoses:  Principal Problem:   Nausea & vomiting Active Problems:   Diabetes mellitus without complication (HCC)   Paroxysmal atrial fibrillation (HCC)   Asthma   SIRS (systemic inflammatory response syndrome) (HCC)   Hemoperitoneum   AKI (acute kidney injury) (HCC)   Hypokalemia    Discharge Condition: stable   Diet recommendation: as tolerated   History of present illness:  51 year old female with DM, asthma, atrial fibrillation on Eliquis, underwent lap chole 12/23/2016, has had hematoma in gallbladder bed. For past 2 days, she was not able to keep anything down and even with ice she has nausea and vomiting. CT abd showed persistent complex fluid seen tracking from the gallbladder fossa into the pelvis along the subhepatic space and right paracolic gutter consistent with known hemoperitoneum. Mild fluid-filled distention of left upper quadrant small bowel loops query enteritis or possibly localized mild small bowel ileus. Interval clearing of right effusion, pneumobilia and most of the subcutaneous emphysema since prior exam.   Hospital Course:  Principal Problem:   Nausea & vomiting - S/P recent lap chole - Likely local irritation from small amount of hemiperitoneum - Feels better this am - Tolerated regular diet  Active Problems:   Small bowel ileus - Complication of cholecystectomy - Stable, resolved     Leukocytosis - Likely reactive      Acute renal failure - Due to GI losses - Resolved with IV fluids     Diabetes mellitus without complication (HCC) - Continue home meds    Paroxysmal atrial fibrillation (HCC) - CHA2DS2-VASc Score2 - Continue flecainide - Continue apixaban, metoprolol,  Cardizem    DVT prophylaxis: On apixaban  Code Status: full code  Family Communication: no family at the bedside     Consultants:   Surgery   Procedures:   None   Antimicrobials:   None     Signed:  Manson Passey, MD  [facility] 01/03/2017, 10:53 AM  Pager #: [phone]  Time spent in minutes: more than 30 minutes    Discharge Exam: Vitals:   01/02/17 2203 01/03/17 0645  BP: 107/72 117/69  Pulse: 83 79  Resp: 18 16  Temp: 98.1 F (36.7 C) 98.1 F (36.7 C)  SpO2: 100% 100%   Vitals:   01/02/17 0521 01/02/17 1439 01/02/17 2203 01/03/17 0645  BP: 129/64 (!) 154/69 107/72 117/69  Pulse: 80 87 83 79  Resp: Temp: 98.6 F (37 C) 98.4 F (36.9 C) 98.1 F (36.7 C) 98.1 F (36.7 C)  TempSrc: Oral Oral Oral Oral  SpO2: 100% 100% 100% 100%  Weight:      Height:        General: Pt is alert, follows commands appropriately, not in acute distress Cardiovascular: Regular rate and rhythm, S1/S2 +, no murmurs Respiratory: Clear to auscultation bilaterally, no wheezing, no crackles, no rhonchi Abdominal: Soft, non tender, non distended, bowel sounds +, no guarding Extremities: no edema, no cyanosis, pulses palpable bilaterally DP and PT Neuro: Grossly nonfocal  Discharge Instructions  Discharge Instructions    Call MD for:    Complete by:  As directed    FEVER > 101.5 F  (temperatures < 101.5 F are not significant)   Call MD for:  extreme fatigue  Complete by:  As directed    Call MD for:  persistant dizziness or light-headedness    Complete by:  As directed    Call MD for:  persistant nausea and vomiting    Complete by:  As directed    Call MD for:  redness, tenderness, or signs of infection (pain, swelling, redness, odor or green/yellow discharge around incision site)    Complete by:  As directed    Call MD for:  severe uncontrolled pain    Complete by:  As directed    Diet - low sodium heart healthy    Complete by:  As  directed    Follow a light diet the first few days at home.   Start with a bland diet such as soups, liquids, starchy foods, low fat foods, etc.   If you feel full, bloated, or constipated, stay on a full liquid or pureed/blenderized diet for a few days until you feel better and no longer constipated. Be sure to drink plenty of fluids every day to avoid getting dehydrated (feeling dizzy, not urinating, etc.). Gradually add a fiber supplement to your diet   Diet - low sodium heart healthy    Complete by:  As directed    Discharge instructions    Complete by:  As directed    See Discharge Instructions If you are not getting better after two weeks or are noticing you are getting worse, contact our office [phone] for further advice.  We may need to adjust your medications, re-evaluate you in the office, send you to the emergency room, or see what other things we can do to help. The clinic staff is available to answer your questions during regular business hours (8:30am-5pm).  Please don't hesitate to call and ask to speak to one of our nurses for clinical concerns.    A surgeon from [facility] is always on call at the hospitals 24 hours/day If you have a medical emergency, go to the nearest emergency room or call 911.   Driving Restrictions    Complete by:  As directed    You may drive when you are no longer taking narcotic prescription pain medication, you can comfortably wear a seatbelt, and you can safely make sudden turns/stops to protect yourself without hesitating due to pain.   Increase activity slowly    Complete by:  As directed    Start light daily activities --- self-care, walking, climbing stairs- beginning the day after surgery.  Gradually increase activities as tolerated.  Control your pain to be active.  Stop when you are tired.  Ideally, walk several times a day, eventually an hour a day.   Most people are back to most day-to-day activities in a few weeks.  It  takes 4-8 weeks to get back to unrestricted, intense activity. If you can walk 30 minutes without difficulty, it is safe to try more intense activity such as jogging, treadmill, bicycling, low-impact aerobics, swimming, etc. Save the most intensive and strenuous activity for last (Usually 4-8 weeks after surgery) such as sit-ups, heavy lifting, contact sports, etc.  Refrain from any intense heavy lifting or straining until you are off narcotics for pain control.  You will have off days, but things should improve week-by-week. DO NOT PUSH THROUGH PAIN.  Let pain be your guide: If it hurts to do something, don't do it.  Pain is your body warning you to avoid that activity for another week until the pain goes down.  Increase activity slowly    Complete by:  As directed    Lifting restrictions    Complete by:  As directed    If you can walk 30 minutes without difficulty, it is safe to try more intense activity such as jogging, treadmill, bicycling, low-impact aerobics, swimming, etc. Save the most intensive and strenuous activity for last (Usually 4-8 weeks after surgery) such as sit-ups, heavy lifting, contact sports, etc.  Refrain from any intense heavy lifting or straining until you are off narcotics for pain control.  You will have off days, but things should improve week-by-week. DO NOT PUSH THROUGH PAIN.  Let pain be your guide: If it hurts to do something, don't do it.  Pain is your body warning you to avoid that activity for another week until the pain goes down.   May walk up steps    Complete by:  As directed    No wound care    Complete by:  As directed    It is good for closed incision and even open wounds to be washed every day.  Shower every day.  Short baths are fine.  Wash the incisions and wounds clean with soap & water.    If you have a closed incision(s), wash the incision with soap & water every day.  You may leave closed incisions open to air if it is dry.   You may cover the  incision with clean gauze & replace it after your daily shower for comfort. If you have skin tapes (Steristrips) or skin glue (Dermabond) on your incision, leave them in place.  They will fall off on their own like a scab.  You may trim any edges that curl up with clean scissors.  If you have staples, set up an appointment for them to be removed in the office in 10 days after surgery.  If you have a drain, wash around the skin exit site with soap & water and place a new dressing of gauze or band aid around the skin every day.  Keep the drain site clean & dry.   Sexual Activity Restrictions    Complete by:  As directed    You may have sexual intercourse when it is comfortable. If it hurts to do something, stop.     Allergies as of 01/03/2017   No Known Allergies     Medication List    TAKE these medications   apixaban 5 MG Tabs tablet Commonly known as:  ELIQUIS Take 1 tablet (5 mg total) by mouth 2 (two) times daily.   CARTIA XT 120 MG 24 hr capsule Generic drug:  diltiazem TAKE 1 CAPSULE BY MOUTH ONCE DAILY. NEED APPT FOR FURTHER REFILLS   diltiazem 30 MG tablet Commonly known as:  CARDIZEM Cardizem 30mg  -- take 1 tablet by mouth every 4 hours AS NEEDED for heart rate >100 as long as blood pressure >100.   docusate sodium 100 MG capsule Commonly known as:  COLACE Take 1 capsule (100 mg total) by mouth 2 (two) times daily.   flecainide 100 MG tablet Commonly known as:  TAMBOCOR Take 1 tablet (100 mg total) by mouth 2 (two) times daily.   metoprolol tartrate 25 MG tablet Commonly known as:  LOPRESSOR Take 1 tablet (25 mg total) by mouth 2 (two) times daily.   oxyCODONE 5 MG immediate release tablet Commonly known as:  Oxy IR/ROXICODONE Take 1 tablet (5 mg total) by mouth every 4 (four) hours as needed for severe  pain or breakthrough pain.   polyethylene glycol packet Commonly known as:  MIRALAX / GLYCOLAX Take 17 g by mouth daily as needed for mild constipation.   VICTOZA  18 MG/3ML Sopn Generic drug:  liraglutide Inject 1.8 mg into the skin daily.            Discharge Care Instructions        Start     Ordered   01/03/17 0000  Discharge instructions    Comments:  See Discharge Instructions If you are not getting better after two weeks or are noticing you are getting worse, contact our office [phone] for further advice.  We may need to adjust your medications, re-evaluate you in the office, send you to the emergency room, or see what other things we can do to help. The clinic staff is available to answer your questions during regular business hours (8:30am-5pm).  Please don't hesitate to call and ask to speak to one of our nurses for clinical concerns.    A surgeon from [facility] is always on call at the hospitals 24 hours/day If you have a medical emergency, go to the nearest emergency room or call 911.   01/03/17 1043   01/03/17 0000  Diet - low sodium heart healthy    Comments:  Follow a light diet the first few days at home.   Start with a bland diet such as soups, liquids, starchy foods, low fat foods, etc.   If you feel full, bloated, or constipated, stay on a full liquid or pureed/blenderized diet for a few days until you feel better and no longer constipated. Be sure to drink plenty of fluids every day to avoid getting dehydrated (feeling dizzy, not urinating, etc.). Gradually add a fiber supplement to your diet   01/03/17 1043   01/03/17 0000  Increase activity slowly    Comments:  Start light daily activities --- self-care, walking, climbing stairs- beginning the day after surgery.  Gradually increase activities as tolerated.  Control your pain to be active.  Stop when you are tired.  Ideally, walk several times a day, eventually an hour a day.   Most people are back to most day-to-day activities in a few weeks.  It takes 4-8 weeks to get back to unrestricted, intense activity. If you can walk 30 minutes without  difficulty, it is safe to try more intense activity such as jogging, treadmill, bicycling, low-impact aerobics, swimming, etc. Save the most intensive and strenuous activity for last (Usually 4-8 weeks after surgery) such as sit-ups, heavy lifting, contact sports, etc.  Refrain from any intense heavy lifting or straining until you are off narcotics for pain control.  You will have off days, but things should improve week-by-week. DO NOT PUSH THROUGH PAIN.  Let pain be your guide: If it hurts to do something, don't do it.  Pain is your body warning you to avoid that activity for another week until the pain goes down.   01/03/17 1043   01/03/17 0000  May walk up steps     01/03/17 1043   01/03/17 0000  Driving Restrictions    Comments:  You may drive when you are no longer taking narcotic prescription pain medication, you can comfortably wear a seatbelt, and you can safely make sudden turns/stops to protect yourself without hesitating due to pain.   01/03/17 1043   01/03/17 0000  Lifting restrictions    Comments:  If you can walk 30 minutes without difficulty, it  is safe to try more intense activity such as jogging, treadmill, bicycling, low-impact aerobics, swimming, etc. Save the most intensive and strenuous activity for last (Usually 4-8 weeks after surgery) such as sit-ups, heavy lifting, contact sports, etc.  Refrain from any intense heavy lifting or straining until you are off narcotics for pain control.  You will have off days, but things should improve week-by-week. DO NOT PUSH THROUGH PAIN.  Let pain be your guide: If it hurts to do something, don't do it.  Pain is your body warning you to avoid that activity for another week until the pain goes down.   01/03/17 1043   01/03/17 0000  Sexual Activity Restrictions    Comments:  You may have sexual intercourse when it is comfortable. If it hurts to do something, stop.   01/03/17 1043   01/03/17 0000  No wound care    Comments:  It is good for  closed incision and even open wounds to be washed every day.  Shower every day.  Short baths are fine.  Wash the incisions and wounds clean with soap & water.    If you have a closed incision(s), wash the incision with soap & water every day.  You may leave closed incisions open to air if it is dry.   You may cover the incision with clean gauze & replace it after your daily shower for comfort. If you have skin tapes (Steristrips) or skin glue (Dermabond) on your incision, leave them in place.  They will fall off on their own like a scab.  You may trim any edges that curl up with clean scissors.  If you have staples, set up an appointment for them to be removed in the office in 10 days after surgery.  If you have a drain, wash around the skin exit site with soap & water and place a new dressing of gauze or band aid around the skin every day.  Keep the drain site clean & dry.   01/03/17 1043   01/03/17 0000  Call MD for:    Comments:  FEVER > 101.5 F  (temperatures < 101.5 F are not significant)   01/03/17 1043   01/03/17 0000  Call MD for:  persistant nausea and vomiting     01/03/17 1043   01/03/17 0000  Call MD for:  severe uncontrolled pain     01/03/17 1043   01/03/17 0000  Call MD for:  redness, tenderness, or signs of infection (pain, swelling, redness, odor or green/yellow discharge around incision site)     01/03/17 1043   01/03/17 0000  Call MD for:  persistant dizziness or light-headedness     01/03/17 1043   01/03/17 0000  Call MD for:  extreme fatigue     01/03/17 1043   01/03/17 0000  Increase activity slowly     01/03/17 1052   01/03/17 0000  Diet - low sodium heart healthy     01/03/17 1052     Follow-up Information    [facility] Follow up on 01/07/2017.   Specialty:  General Surgery Why:  Keep appt at [facility] office this Thursday Contact information: [address] [place] [address] [phone]           The  results of significant diagnostics from this hospitalization (including imaging, microbiology, ancillary and laboratory) are listed below for reference.    Significant Diagnostic Studies: Ct Abdomen Pelvis Wo Contrast  Result Date: 12/25/2016  CLINICAL DATA:  Nausea and vomiting. Laparoscopic cholecystectomy 12/23/2016 EXAM: CT ABDOMEN AND PELVIS WITHOUT CONTRAST TECHNIQUE: Multidetector CT imaging of the abdomen and pelvis was performed following the standard protocol without IV contrast. COMPARISON:  06/17/2016 abdominal CT.  Ultrasound 12/23/2016 FINDINGS: Lower chest: Trace right pleural effusion. Lower lobe atelectasis. Minimal anterior pericardial fluid. Hepatobiliary: Gas within the right lobe of the liver could be pneumobilia from cholangiography, or pneumoperitoneum in deep fissures. No masslike findings. Cholecystectomy. There is moderate hemorrhage in the gallbladder fossa tracking in the interloop space and right pericolic gutter, with small volume hemorrhage in the pelvis. The hemorrhage in the gallbladder fossa region measures up to 6 cm in maximal dimension. Pneumoperitoneum, expected postoperatively. Pancreas: Unremarkable. Spleen: Unremarkable. Adrenals/Urinary Tract: Negative adrenals. No hydronephrosis or stone. Moderately distended bladder, otherwise unremarkable. Stomach/Bowel:  No obstruction. No appendicitis. Vascular/Lymphatic: No acute vascular abnormality. No mass or adenopathy. Reproductive:Fibroid uterus. Calcified fibroid on the right is the best seen and measures 3 cm. Other:   Nonspecific body wall edema. Musculoskeletal: No acute abnormalities. These results were called by telephone at the time of interpretation on 12/25/2016 at 2:59 pm to Dr. Feliciana Rossetti , who verbally acknowledged these results. IMPRESSION: 1. Moderate hemoperitoneum. The majority of clot is in the gallbladder fossa and pelvic recesses. 2. No low-density collection to suggest biloma. 3. Atelectasis and trace  right pleural effusion. 4. Fibroid uterus. Electronically Signed   By: Marnee Spring M.D.   On: 12/25/2016 15:00   US Abdomen Complete  Result Date: 12/17/2016 CLINICAL DATA:  Nausea, vomiting for 3 weeks EXAM: ABDOMEN ULTRASOUND COMPLETE COMPARISON:  06/17/2016 FINDINGS: Gallbladder: Multiple stones, the largest 1.8 cm in the gallbladder neck. Gallbladder wall borderline in thickness at 3 mm. Negative sonographic Murphy's. Common bile duct: Diameter: Normal caliber, 5 mm Liver: No focal lesion identified. Within normal limits in parenchymal echogenicity. Portal vein is patent on color Doppler imaging with normal direction of blood flow towards the liver. IVC: No abnormality visualized. Pancreas: Visualized portion unremarkable. Spleen: Size and appearance within normal limits. Right Kidney: Length: 12.3 cm. Echogenicity within normal limits. No mass or hydronephrosis visualized. Left Kidney: Length: 12.4 cm. Echogenicity within normal limits. No mass or hydronephrosis visualized. Abdominal aorta: No aneurysm visualized. Other findings: None. IMPRESSION: Cholelithiasis. Borderline gallbladder wall thickness may reflect chronic cholecystitis. There is no sonographic Murphy sign. Electronically Signed   By: Charlett Nose M.D.   On: 12/17/2016 08:47   Ct Abdomen Pelvis W Contrast  Result Date: 12/31/2016 CLINICAL DATA:  Nausea and emesis.  Cholecystectomy 12/23/2016. EXAM: CT ABDOMEN AND PELVIS WITH CONTRAST TECHNIQUE: Multidetector CT imaging of the abdomen and pelvis was performed using the standard protocol following bolus administration of intravenous contrast. CONTRAST:  100 cc ISOVUE-300 IOPAMIDOL (ISOVUE-300) INJECTION 61% COMPARISON:  12/25/2016 CT FINDINGS: Lower chest: Clearing of small right effusion. Normal sized heart. No acute pulmonary abnormality. Hepatobiliary: Status post cholecystectomy. Complex hemorrhagic fluid is again seen tracking from the gallbladder fossa into the subhepatic space and  right paracolic gutter with small volume again noted in the pelvis. Given administration of IV contrast, this fluid is better visualized on today's study and does not appear to have significantly progressed. No abscess is noted. Resolution of subdiaphragmatic fluid since prior as well as pneumobilia. Pancreas: Normal Spleen: Normal Adrenals/Urinary Tract: Normal bilateral adrenal glands. No obstructive uropathy or renal calculi. 6 mm hypodensity in the interpolar right kidney is noted statistically consistent with a cyst but too small to further characterize. The urinary bladder  is unremarkable. Stomach/Bowel: Fluid-filled distention of the stomach. Mild fluid-filled distention of jejunal loops in the left upper quadrant, query mild enteritis. No mechanical bowel obstruction. The appendix and large intestine is unremarkable. Vascular/Lymphatic: No significant vascular findings are present. No enlarged abdominal or pelvic lymph nodes. Reproductive: Fibroid uterus. Largest calcified uterine fibroid is seen on the right estimated at 2.4 cm in maximum dimension currently. No adnexal mass. Other: Postop induration at sites of laparoscopic ports in the lower abdomen and umbilicus with subcutaneous emphysema adjacent to the umbilicus and within the umbilicus. Musculoskeletal: No acute or significant osseous findings. IMPRESSION: 1. Persistent complex fluid is seen tracking from the gallbladder fossa into the pelvis along the subhepatic space and right paracolic gutter consistent with known hemoperitoneum. This fluid is better visualized on today's study given administration of intravenous contrast medium. Allowing for differences in technique, no significant progression in size is appreciated of this fluid. No enhancing collections are noted to suggest an abscess. 2. Mild fluid-filled distention of left upper quadrant small bowel loops query enteritis or possibly localized mild small bowel ileus. 3. Interval clearing of  right effusion, pneumobilia and most of the subcutaneous emphysema since prior exam. Residual periumbilical soft tissue emphysema persists. 4. Fibroid uterus. 5. Subcentimeter right renal cyst too small to further characterize. Electronically Signed   By: Tollie Eth M.D.   On: 12/31/2016 20:33   Dg Abd Acute W/chest  Result Date: 12/31/2016 CLINICAL DATA:  Emesis since cholecystectomy 12/29/2016. Shortness of breath. EXAM: DG ABDOMEN ACUTE W/ 1V CHEST COMPARISON:  Chest x-ray 01/03/2016 and CT 12/25/2016 FINDINGS: Lungs are hypoinflated and otherwise clear. Cardiomediastinal silhouette and remainder of the chest is within normal. Abdominopelvic images demonstrate a nonobstructive bowel gas pattern. No evidence of free peritoneal air. Evidence of known calcified uterine fibroid. Few pelvic phleboliths. Remaining bony structures are normal. IMPRESSION: Negative abdominal radiographs.  No acute cardiopulmonary disease. Electronically Signed   By: Elberta Fortis M.D.   On: 12/31/2016 16:36   US Abdomen Limited Ruq  Result Date: 12/23/2016 CLINICAL DATA:  Nausea and vomiting for 3 weeks EXAM: ULTRASOUND ABDOMEN LIMITED RIGHT UPPER QUADRANT COMPARISON:  Ultrasound 12/17/2016 FINDINGS: Gallbladder: Shadowing stones, measuring up to 1.8 cm. Negative sonographic Murphy. Normal wall thickness. Common bile duct: Diameter: 4.4 mm Liver: No focal lesion identified. Within normal limits in parenchymal echogenicity. Portal vein is patent on color Doppler imaging with normal direction of blood flow towards the liver. IMPRESSION: Cholelithiasis without sonographic evidence for acute cholecystitis or biliary dilatation. Electronically Signed   By: Jasmine Pang M.D.   On: 12/23/2016 00:31    Microbiology: Recent Results (from the past 240 hour(s))  Culture, blood (x 2)     Status: None (Preliminary result)   Collection Time: 12/31/16 10:23 PM  Result Value Ref Range Status   Specimen Description BLOOD LEFT ANTECUBITAL   Final   Special Requests   Final    BOTTLES DRAWN AEROBIC AND ANAEROBIC Blood Culture results may not be optimal due to an inadequate volume of blood received in culture bottles   Culture   Final    NO GROWTH 1 DAY Performed at [facility], [address] [place], [place] [address]    Report Status PENDING  Incomplete     Labs: Basic Metabolic Panel:  Recent Labs Lab 12/29/16 0757 12/31/16 1251 01/01/17 0425 01/02/17 0404 01/03/17 0507  NA 137 140 141 140 137  K 3.7 3.7 3.5 3.4* 3.7  CL 104 104 109 110  108  CO2 23 22 22 24 23   GLUCOSE 185* 130* 128* 122* 134*  BUN 14 23* 18 11 10   CREATININE 0.93 1.12* 0.86 0.85 0.91  CALCIUM 8.2* 8.7* 8.0* 7.8* 7.9*  MG  --   --   --  1.6*  --    Liver Function Tests:  Recent Labs Lab 12/31/16 1251  AST 30  ALT 23  ALKPHOS 96  BILITOT 1.2  PROT 7.2  ALBUMIN 3.4*    Recent Labs Lab 12/31/16 1251  LIPASE 28   No results for input(s): AMMONIA in the last 168 hours. CBC:  Recent Labs Lab 12/29/16 0757 12/31/16 1251 01/01/17 0425 01/02/17 0404 01/03/17 0507  WBC 13.2* 14.5* 14.0* 12.2* 11.1*  HGB 9.2* 10.1* 9.3* 8.6* 8.6*  HCT 27.1* 29.7* 28.0* 25.6* 25.8*  MCV 85.2 83.7 85.1 85.9 86.0  PLT 355 462* 439* 409* 420*   Cardiac Enzymes: No results for input(s): CKTOTAL, CKMB, CKMBINDEX, TROPONINI in the last 168 hours. BNP: BNP (last 3 results) No results for input(s): BNP in the last 8760 hours.  ProBNP (last 3 results) No results for input(s): PROBNP in the last 8760 hours.  CBG:  Recent Labs Lab 01/01/17 2211 01/02/17 0715 01/02/17 1114 01/02/17 1704 01/02/17 2203  GLUCAP 150* 117* 162* 179* 226*

## 2017-01-04 LAB — GLUCOSE, CAPILLARY: Glucose-Capillary: 129 mg/dL — ABNORMAL HIGH (ref 65–99)

## 2017-01-05 ENCOUNTER — Encounter (HOSPITAL_COMMUNITY): Payer: Self-pay | Admitting: Nurse Practitioner

## 2017-01-05 ENCOUNTER — Emergency Department (HOSPITAL_COMMUNITY)
Admission: EM | Admit: 2017-01-05 | Discharge: 2017-01-05 | Disposition: A | Discharge status: Home / Self Care | Payer: 59 | Attending: Emergency Medicine | Admitting: Emergency Medicine

## 2017-01-05 DIAGNOSIS — J45909 Unspecified asthma, uncomplicated: Secondary | ICD-10-CM | POA: Insufficient documentation

## 2017-01-05 DIAGNOSIS — Z7901 Long term (current) use of anticoagulants: Secondary | ICD-10-CM | POA: Insufficient documentation

## 2017-01-05 DIAGNOSIS — R1110 Vomiting, unspecified: Secondary | ICD-10-CM | POA: Diagnosis not present

## 2017-01-05 DIAGNOSIS — Z79899 Other long term (current) drug therapy: Secondary | ICD-10-CM | POA: Insufficient documentation

## 2017-01-05 DIAGNOSIS — E119 Type 2 diabetes mellitus without complications: Secondary | ICD-10-CM | POA: Insufficient documentation

## 2017-01-05 DIAGNOSIS — E876 Hypokalemia: Secondary | ICD-10-CM | POA: Insufficient documentation

## 2017-01-05 DIAGNOSIS — R112 Nausea with vomiting, unspecified: Secondary | ICD-10-CM | POA: Diagnosis present

## 2017-01-05 LAB — COMPREHENSIVE METABOLIC PANEL
ALT: 21 U/L (ref 14–54)
AST: 22 U/L (ref 15–41)
Albumin: 3.6 g/dL (ref 3.5–5.0)
Alkaline Phosphatase: 89 U/L (ref 38–126)
Anion gap: 12 (ref 5–15)
BUN: 17 mg/dL (ref 6–20)
CO2: 26 mmol/L (ref 22–32)
Calcium: 8.9 mg/dL (ref 8.9–10.3)
Chloride: 104 mmol/L (ref 101–111)
Creatinine, Ser: 1.16 mg/dL — ABNORMAL HIGH (ref 0.44–1.00)
GFR calc Af Amer: >60 mL/min (ref >60)
GFR calc non Af Amer: 53 mL/min — ABNORMAL LOW (ref >60)
Glucose, Bld: 129 mg/dL — ABNORMAL HIGH (ref 65–99)
Potassium: 3.2 mmol/L — ABNORMAL LOW (ref 3.5–5.1)
Sodium: 142 mmol/L (ref 135–145)
Total Bilirubin: 0.9 mg/dL (ref 0.3–1.2)
Total Protein: 7.6 g/dL (ref 6.5–8.1)

## 2017-01-05 LAB — CBC
HCT: 30.6 % — ABNORMAL LOW (ref 36.0–46.0)
Hemoglobin: 10.3 g/dL — ABNORMAL LOW (ref 12.0–15.0)
MCH: 28.6 pg (ref 26.0–34.0)
MCHC: 33.7 g/dL (ref 30.0–36.0)
MCV: 85 fL (ref 78.0–100.0)
Platelets: 532 10*3/uL — ABNORMAL HIGH (ref 150–400)
RBC: 3.6 MIL/uL — ABNORMAL LOW (ref 3.87–5.11)
RDW: 14.3 % (ref 11.5–15.5)
WBC: 11.7 10*3/uL — ABNORMAL HIGH (ref 4.0–10.5)

## 2017-01-05 LAB — LIPASE, BLOOD: Lipase: 32 U/L (ref 11–51)

## 2017-01-05 MED ORDER — METOCLOPRAMIDE HCL 10 MG PO TABS: 10.0000 mg | ORAL_TABLET | Freq: Three times a day (TID) | ORAL | 0 refills | Status: DC | PRN

## 2017-01-05 MED ORDER — SODIUM CHLORIDE 0.9 % IV BOLUS (SEPSIS)
1000.0000 mL | Freq: Once | INTRAVENOUS | Status: AC
  Administered 2017-01-05: 1000 mL via INTRAVENOUS

## 2017-01-05 MED ORDER — POTASSIUM CHLORIDE 20 MEQ/15ML (10%) PO SOLN: 20.0000 meq | Freq: Two times a day (BID) | ORAL | 0 refills | Status: DC

## 2017-01-05 MED ORDER — POTASSIUM CHLORIDE CRYS ER 20 MEQ PO TBCR
40.0000 meq | EXTENDED_RELEASE_TABLET | Freq: Once | ORAL | Status: DC
  Filled 2017-01-05: qty 2

## 2017-01-05 MED ORDER — POTASSIUM CHLORIDE 20 MEQ/15ML (10%) PO SOLN
40.0000 meq | Freq: Once | ORAL | Status: AC
  Administered 2017-01-05: 40 meq via ORAL
  Filled 2017-01-05: qty 30

## 2017-01-05 MED ORDER — METOCLOPRAMIDE HCL 5 MG/ML IJ SOLN
10.0000 mg | Freq: Once | INTRAMUSCULAR | Status: AC
  Administered 2017-01-05: 10 mg via INTRAVENOUS
  Filled 2017-01-05: qty 2

## 2017-01-05 NOTE — ED Notes (Signed)
Patient had one episode of emesis. MD notified.

## 2017-01-05 NOTE — ED Provider Notes (Signed)
[facility]-EMERGENCY DEPT Provider Note   CSN: [id number] Arrival date & time: 01/05/17  1101     History   Chief Complaint Chief Complaint  Patient presents with  . Nausea  . Emesis  . Weakness    Dawn BRYSTOL WASILEWSKI is a 51 y.o. female.  Dawn  51 year old female with a history of type 2 diabetes and paroxysmal A. Fib presents with vomiting. She's been having vomiting for about one month per her estimation. She saw her gastroenterologist, Dr. Loreta Ave who after an ultrasound Braun that there are gallstones. She had her cholecystectomy on 9/19. However she has still had vomiting and had to be readmitted to the hospital on 9/27. Since getting out she has continued to have vomiting. She never feels nausea but anytime she eats or drinks she has vomited. She states her emesis looks like [employer]. No hematemesis. She had a normal bowel movement yesterday. She denies any abdominal pain besides mild soreness at the laparoscopy sites. No abdominal distention. No fevers, chest pain, or urinary symptoms. She gets dizzy when standing. Did not take her BP meds this morning.  Past Medical History:  Diagnosis Date  . Asthma   . Diabetes mellitus without complication (HCC)    type 2  . Paroxysmal atrial fibrillation [facility])     Patient Active Problem List   Diagnosis Date Noted  . Hypokalemia 01/02/2017  . SIRS (systemic inflammatory response syndrome) (HCC) 12/31/2016  . Hemoperitoneum 12/31/2016  . Nausea & vomiting 12/31/2016  . AKI (acute kidney injury) (HCC) 12/31/2016  . Paroxysmal atrial fibrillation (HCC)   . Asthma   . Diabetes mellitus without complication (HCC) 12/23/2016    Past Surgical History:  Procedure Laterality Date  . CHOLECYSTECTOMY N/A 12/23/2016   Procedure: LAPAROSCOPIC CHOLECYSTECTOMY;  Surgeon: Kinsinger, De Blanch, MD;  Location: [facility] ORS;  Service: General;  Laterality: N/A;  . EYE SURGERY Right   . KNEE ARTHROSCOPY Right   . SPLIT NIGHT STUDY  08/06/2015  .  TUBAL LIGATION      OB History    No data available       Home Medications    Prior to Admission medications   Medication Sig Start Date End Date Taking? Authorizing Provider  apixaban (ELIQUIS) 5 MG TABS tablet Take 1 tablet (5 mg total) by mouth 2 (two) times daily. 07/11/15  Yes Newman Nip, NP  CARTIA XT 120 MG 24 hr capsule TAKE 1 CAPSULE BY MOUTH ONCE DAILY. NEED APPT FOR FURTHER REFILLS 12/17/16  Yes Newman Nip, NP  diltiazem (CARDIZEM) 30 MG tablet Cardizem  -- take 1 tablet by mouth every 4 hours AS NEEDED for heart rate >100 as long as blood pressure >100. 07/01/15  Yes Newman Nip, NP  docusate sodium (COLACE) 100 MG capsule Take 1 capsule (100 mg total) by mouth 2 (two) times daily. 12/29/16  Yes Berna Bue, MD  flecainide (TAMBOCOR) 100 MG tablet Take 1 tablet (100 mg total) by mouth 2 (two) times daily. 03/03/16  Yes Newman Nip, NP  metoprolol tartrate (LOPRESSOR) 25 MG tablet Take 1 tablet (25 mg total) by mouth 2 (two) times daily. 12/29/16  Yes Berna Bue, MD  oxyCODONE (OXY IR/ROXICODONE) 5 MG immediate release tablet Take 1 tablet (5 mg total) by mouth every 4 (four) hours as needed for severe pain or breakthrough pain. 12/29/16  Yes Berna Bue, MD  polyethylene glycol (MIRALAX / GLYCOLAX) packet Take 17 g by mouth daily as needed  for mild constipation.   Yes [provider]  VICTOZA 18 MG/3ML SOPN Inject 1.8 mg into the skin daily. 08/17/15  Yes [provider]  metoCLOPramide (REGLAN) 10 MG tablet Take 1 tablet (10 mg total) by mouth every 8 (eight) hours as needed for nausea (nausea/headache). 01/05/17   Pricilla Loveless, MD  potassium chloride 20 MEQ/15ML (10%) SOLN Take 15 mLs (20 mEq total) by mouth 2 (two) times daily. 01/05/17 01/08/17  Pricilla Loveless, MD    Family History Family History  Problem Relation Age of Onset  . Epilepsy Father   . Diabetes Mother   . Kidney disease Mother   . Hypertension Mother    . Kidney disease Brother   . Diabetic kidney disease Brother   . Diabetes Brother     Social History Social History  Substance Use Topics  . Smoking status: Never Smoker  . Smokeless tobacco: Never Used  . Alcohol use Yes     Comment: occasional     Allergies   Patient has no known allergies.   Review of Systems Review of Systems  Constitutional: Negative for fever.  Respiratory: Negative for shortness of breath.   Cardiovascular: Negative for chest pain.  Gastrointestinal: Positive for vomiting. Negative for abdominal distention, abdominal pain, blood in stool, diarrhea and nausea.  Genitourinary: Negative for dysuria.  Neurological: Positive for dizziness (when standing).  All other systems reviewed and are negative.    Physical Exam Updated Vital Signs BP (!) 148/86   Pulse (!) 122   Temp 98.2 F (36.8 C)   Resp (!) 24   Ht  (1.575 m)   Wt 90.7 kg (200 lb)   SpO2 100%   BMI 36.58 kg/m   Physical Exam  Constitutional: She is oriented to person, place, and time. She appears well-developed and well-nourished.  HENT:  Head: Normocephalic and atraumatic.  Right Ear: External ear normal.  Left Ear: External ear normal.  Nose: Nose normal.  Eyes: Right eye exhibits no discharge. Left eye exhibits no discharge.  Cardiovascular: Regular rhythm and normal heart sounds.  Tachycardia present.   Pulmonary/Chest: Effort normal and breath sounds normal.  Abdominal: Soft. She exhibits no distension. There is no tenderness.  Well appearing laparoscopy wounds  Neurological: She is alert and oriented to person, place, and time.  Skin: Skin is warm and dry.  Nursing note and vitals reviewed.    ED Treatments / Results  Labs (all labs ordered are listed, but only abnormal results are displayed) Labs Reviewed  COMPREHENSIVE METABOLIC PANEL - Abnormal; Notable for the following:       Result Value   Potassium 3.2 (*)    Glucose, Bld 129 (*)    Creatinine, Ser  1.16 (*)    GFR calc non Af Amer 53 (*)    All other components within normal limits  CBC - Abnormal; Notable for the following:    WBC 11.7 (*)    RBC 3.60 (*)    Hemoglobin 10.3 (*)    HCT 30.6 (*)    Platelets 532 (*)    All other components within normal limits  LIPASE, BLOOD  URINALYSIS, ROUTINE W REFLEX MICROSCOPIC    EKG  EKG Interpretation None       Radiology No results Braun.  Procedures Procedures (including critical care time)  Medications Ordered in ED Medications  sodium chloride 0.9 % bolus 1,000 mL (0 mLs Intravenous Stopped 01/05/17 1537)  sodium chloride 0.9 % bolus 1,000 mL (0 mLs  Intravenous Stopped 01/05/17 1632)  metoCLOPramide (REGLAN) injection 10 mg (10 mg Intravenous Given 01/05/17 1400)  potassium chloride 20 MEQ/15ML (10%) solution 40 mEq (40 mEq Oral Given 01/05/17 1634)     Initial Impression / Assessment and Plan / ED Course  I have reviewed the triage vital signs and the nursing notes.  Pertinent labs & imaging results that were available during my care of the patient were reviewed by me and considered in my medical decision making (see chart for details).     Patient vomited once prior to administration of Reglan, has not had any vomiting since. She has mild hypokalemia, likely from some vomiting recently. However otherwise her lab work and exam are unremarkable. She was given IV fluids for tachycardia that is probably related to some dehydration. Given no further vomiting, thing she is stable for discharge home. Given she had this type of vomiting before her gallbladder surgery, its possible her gallbladder was never the culprit and she may have something such as diabetic gastroparesis. I've encouraged her to follow closely with her gastroenterologist. Discussed return precautions.  Final Clinical Impressions(s) / ED Diagnoses   Final diagnoses:  Acute vomiting  Hypokalemia    New Prescriptions New Prescriptions   METOCLOPRAMIDE  (REGLAN) 10 MG TABLET    Take 1 tablet (10 mg total) by mouth every 8 (eight) hours as needed for nausea (nausea/headache).   POTASSIUM CHLORIDE 20 MEQ/15ML (10%) SOLN    Take 15 mLs (20 mEq total) by mouth 2 (two) times daily.     Pricilla Loveless, MD 01/05/17 [phone]

## 2017-01-05 NOTE — ED Notes (Signed)
Patient given gingerale and encouraged to drink slowly.

## 2017-01-05 NOTE — ED Triage Notes (Signed)
Patient c/o nausea and vomiting since having surgery on 9/24. She was admitted and most recently discharged on 9/30. Patient states she can't keep any foods or liquids down and now when she stands up or tries to move she becomes very dizzy and weak. States she is sure she is dehydrated. Patient is A&O x4. Denies any known fevers, abdominal pain. Reports she is unable to take prescribed medications because she throws it back up.

## 2017-01-06 LAB — CULTURE, BLOOD (ROUTINE X 2): Culture: NO GROWTH

## 2017-01-14 ENCOUNTER — Other Ambulatory Visit (HOSPITAL_COMMUNITY): Payer: Self-pay | Admitting: Nurse Practitioner

## 2017-01-27 ENCOUNTER — Other Ambulatory Visit (HOSPITAL_COMMUNITY): Payer: Self-pay | Admitting: Nurse Practitioner

## 2017-01-29 ENCOUNTER — Ambulatory Visit (HOSPITAL_COMMUNITY)
Admission: RE | Admit: 2017-01-29 | Discharge: 2017-01-29 | Disposition: A | Discharge status: Home / Self Care | Payer: 59 | Source: Ambulatory Visit | Attending: Nurse Practitioner | Admitting: Nurse Practitioner

## 2017-01-29 VITALS — BP 118/70 | HR 96 | Ht 62.0 in | Wt 198.0 lb

## 2017-01-29 DIAGNOSIS — Z9889 Other specified postprocedural states: Secondary | ICD-10-CM | POA: Insufficient documentation

## 2017-01-29 DIAGNOSIS — Z833 Family history of diabetes mellitus: Secondary | ICD-10-CM | POA: Insufficient documentation

## 2017-01-29 DIAGNOSIS — E119 Type 2 diabetes mellitus without complications: Secondary | ICD-10-CM | POA: Insufficient documentation

## 2017-01-29 DIAGNOSIS — Z9049 Acquired absence of other specified parts of digestive tract: Secondary | ICD-10-CM | POA: Insufficient documentation

## 2017-01-29 DIAGNOSIS — Z79899 Other long term (current) drug therapy: Secondary | ICD-10-CM | POA: Insufficient documentation

## 2017-01-29 DIAGNOSIS — Z8249 Family history of ischemic heart disease and other diseases of the circulatory system: Secondary | ICD-10-CM | POA: Insufficient documentation

## 2017-01-29 DIAGNOSIS — Z841 Family history of disorders of kidney and ureter: Secondary | ICD-10-CM | POA: Diagnosis not present

## 2017-01-29 DIAGNOSIS — I480 Paroxysmal atrial fibrillation: Secondary | ICD-10-CM

## 2017-01-29 DIAGNOSIS — Z7901 Long term (current) use of anticoagulants: Secondary | ICD-10-CM | POA: Insufficient documentation

## 2017-01-29 DIAGNOSIS — J45909 Unspecified asthma, uncomplicated: Secondary | ICD-10-CM | POA: Insufficient documentation

## 2017-01-29 MED ORDER — DILTIAZEM HCL ER COATED BEADS 120 MG PO CP24: 120.0000 mg | ORAL_CAPSULE | Freq: Every day | ORAL | 6 refills | Status: DC

## 2017-01-29 MED ORDER — FLECAINIDE ACETATE 100 MG PO TABS: 100.0000 mg | ORAL_TABLET | Freq: Two times a day (BID) | ORAL | 6 refills | Status: DC

## 2017-01-29 MED ORDER — METOPROLOL TARTRATE 25 MG PO TABS: 25.0000 mg | ORAL_TABLET | Freq: Two times a day (BID) | ORAL | 6 refills | Status: DC

## 2017-01-29 NOTE — Progress Notes (Addendum)
Primary Care Physician: Laurena Slimmerlark, Preston S, MD Referring Physician: ER f/u   Dawn Braun is a 51 y.o. female with a h/o afib on flecainide with  gallbladder surgery 12/2016. Her afib was quiet during the surgery. She has recovered nicely. Continues on flecainide and eliquis 5 mg bid.   Today, she denies symptoms of palpitations, chest pain, shortness of breath, orthopnea, PND, lower extremity edema, dizziness, presyncope, syncope, or neurologic sequela. The patient is tolerating medications without difficulties and is otherwise without complaint today.   Past Medical History:  Diagnosis Date  . Asthma   . Diabetes mellitus without complication (HCC)    type 2  . Paroxysmal atrial fibrillation [facility](HCC)    Past Surgical History:  Procedure Laterality Date  . CHOLECYSTECTOMY N/A 12/23/2016   Procedure: LAPAROSCOPIC CHOLECYSTECTOMY;  Surgeon: Kinsinger, De BlanchLuke Aaron, MD;  Location: [facility] ORS;  Service: General;  Laterality: N/A;  . EYE SURGERY Right   . KNEE ARTHROSCOPY Right   . SPLIT NIGHT STUDY  08/06/2015  . TUBAL LIGATION      Current Outpatient Prescriptions  Medication Sig Dispense Refill  . apixaban (ELIQUIS) 5 MG TABS tablet Take 1 tablet (5 mg total) by mouth 2 (two) times daily. 60 tablet 11  . diltiazem (CARTIA XT) 120 MG 24 hr capsule Take 1 capsule (120 mg total) by mouth daily. 30 capsule 6  . docusate sodium (COLACE) 100 MG capsule Take 1 capsule (100 mg total) by mouth 2 (two) times daily. 10 capsule 0  . flecainide (TAMBOCOR) 100 MG tablet Take 1 tablet (100 mg total) by mouth 2 (two) times daily. 60 tablet 6  . metoprolol tartrate (LOPRESSOR) 25 MG tablet Take 1 tablet (25 mg total) by mouth 2 (two) times daily. 60 tablet 6  . VICTOZA 18 MG/3ML SOPN Inject 1.8 mg into the skin daily.  3  . diltiazem (CARDIZEM) 30 MG tablet Cardizem 30mg  -- take 1 tablet by mouth every 4 hours AS NEEDED for heart rate >100 as long as blood pressure >100. 45 tablet 3  . potassium  chloride 20 MEQ/15ML (10%) SOLN Take 15 mLs (20 mEq total) by mouth 2 (two) times daily. 90 mL 0   No current facility-administered medications for this encounter.     No Known Allergies  Social History   Social History  . Marital status: Single    Spouse name: N/A  . Number of children: 2  . Years of education: N/A   Occupational History  . [employer]    Social History Main Topics  . Smoking status: Never Smoker  . Smokeless tobacco: Never Used  . Alcohol use Yes     Comment: occasional  . Drug use: No  . Sexual activity: No   Other Topics Concern  . Not on file   Social History Narrative  . No narrative on file    Family History  Problem Relation Age of Onset  . Epilepsy Father   . Diabetes Mother   . Kidney disease Mother   . Hypertension Mother   . Kidney disease Brother   . Diabetic kidney disease Brother   . Diabetes Brother     ROS- All systems are reviewed and negative except as per the HPI above  Physical Exam: Vitals:   01/29/17 0842  BP: 118/70  Pulse: 96  Weight: 198 lb (89.8 kg)  Height: 5\' 2"  (1.575 m)   Wt Readings from Last 3 Encounters:  01/29/17 198 lb (89.8 kg)  01/05/17 200  lb (90.7 kg)  01/01/17 200 lb 9.6 oz (91 kg)    Labs: Lab Results  Component Value Date   NA 142 01/05/2017   K 3.2 (L) 01/05/2017   CL 104 01/05/2017   CO2 26 01/05/2017   GLUCOSE 129 (H) 01/05/2017   BUN 17 01/05/2017   CREATININE 1.16 (H) 01/05/2017   CALCIUM 8.9 01/05/2017   MG 1.6 (L) 01/02/2017   Lab Results  Component Value Date   INR 1.10 12/31/2016   No results found for: CHOL, HDL, LDLCALC, TRIG   GEN- The patient is well appearing, alert and oriented x 3 today.   Head- normocephalic, atraumatic Eyes-  Sclera clear, conjunctiva pink Ears- hearing intact Oropharynx- clear Neck- supple, no JVP Lymph- no cervical lymphadenopathy Lungs- Clear to ausculation bilaterally, normal work of breathing Heart- Regular rate and  rhythm, no murmurs, rubs or gallops, PMI not laterally displaced GI- soft, NT, ND, + BS Extremities- no clubbing, cyanosis, or edema MS- no significant deformity or atrophy Skin- no rash or lesion Psych- euthymic mood, full affect Neuro- strength and sensation are intact  EKG- NSR at 96 bpm, pr int 182 ms, qrs int 78 ms, qtc 442 ms Epic records reviewed    Assessment and Plan: 1. Paroxysmal afib Quiet  on flecainide 100 mg bid Continue metoprolol 25 mg bid Continue eliquis 5 mg bid for a chadsvasc score of 2(female, DM)  2. S/p cholecystectomy  Recovered nicely  F/u in 6 months   Elvina Sidle. Matthew Folks [facility] [address] [place], [place] [address] [phone]

## 2017-02-03 NOTE — Addendum Note (Signed)
Encounter addended by: Newman Niparroll, Havilah Topor C, NP on: 02/03/2017  5:09 PM<BR>    Actions taken: LOS modified

## 2017-03-23 ENCOUNTER — Other Ambulatory Visit (HOSPITAL_COMMUNITY): Payer: Self-pay | Admitting: Nurse Practitioner

## 2017-04-03 ENCOUNTER — Other Ambulatory Visit (HOSPITAL_COMMUNITY): Payer: Self-pay | Admitting: Nurse Practitioner

## 2017-05-12 ENCOUNTER — Emergency Department (HOSPITAL_COMMUNITY)
Admission: EM | Admit: 2017-05-12 | Discharge: 2017-05-12 | Disposition: A | Discharge status: Home / Self Care | Payer: Managed Care, Other (non HMO) | Attending: Emergency Medicine | Admitting: Emergency Medicine

## 2017-05-12 ENCOUNTER — Encounter (HOSPITAL_COMMUNITY): Payer: Self-pay | Admitting: *Deleted

## 2017-05-12 DIAGNOSIS — E119 Type 2 diabetes mellitus without complications: Secondary | ICD-10-CM | POA: Diagnosis not present

## 2017-05-12 DIAGNOSIS — I480 Paroxysmal atrial fibrillation: Secondary | ICD-10-CM | POA: Diagnosis not present

## 2017-05-12 DIAGNOSIS — R1110 Vomiting, unspecified: Secondary | ICD-10-CM | POA: Diagnosis present

## 2017-05-12 DIAGNOSIS — J45909 Unspecified asthma, uncomplicated: Secondary | ICD-10-CM | POA: Diagnosis not present

## 2017-05-12 DIAGNOSIS — R030 Elevated blood-pressure reading, without diagnosis of hypertension: Secondary | ICD-10-CM | POA: Diagnosis not present

## 2017-05-12 DIAGNOSIS — Z79899 Other long term (current) drug therapy: Secondary | ICD-10-CM | POA: Diagnosis not present

## 2017-05-12 LAB — COMPREHENSIVE METABOLIC PANEL
ALT: 22 U/L (ref 14–54)
AST: 31 U/L (ref 15–41)
Albumin: 4.3 g/dL (ref 3.5–5.0)
Alkaline Phosphatase: 115 U/L (ref 38–126)
Anion gap: 10 (ref 5–15)
BUN: 24 mg/dL — ABNORMAL HIGH (ref 6–20)
CO2: 25 mmol/L (ref 22–32)
Calcium: 9.5 mg/dL (ref 8.9–10.3)
Chloride: 106 mmol/L (ref 101–111)
Creatinine, Ser: 1.01 mg/dL — ABNORMAL HIGH (ref 0.44–1.00)
GFR calc Af Amer: >60 mL/min (ref >60)
GFR calc non Af Amer: >60 mL/min (ref >60)
Glucose, Bld: 180 mg/dL — ABNORMAL HIGH (ref 65–99)
Potassium: 3.5 mmol/L (ref 3.5–5.1)
Sodium: 141 mmol/L (ref 135–145)
Total Bilirubin: 0.7 mg/dL (ref 0.3–1.2)
Total Protein: 8.9 g/dL — ABNORMAL HIGH (ref 6.5–8.1)

## 2017-05-12 LAB — CBC
HCT: 40.8 % (ref 36.0–46.0)
Hemoglobin: 14.1 g/dL (ref 12.0–15.0)
MCH: 28 pg (ref 26.0–34.0)
MCHC: 34.6 g/dL (ref 30.0–36.0)
MCV: 81.1 fL (ref 78.0–100.0)
Platelets: 373 10*3/uL (ref 150–400)
RBC: 5.03 MIL/uL (ref 3.87–5.11)
RDW: 14.2 % (ref 11.5–15.5)
WBC: 12.1 10*3/uL — ABNORMAL HIGH (ref 4.0–10.5)

## 2017-05-12 LAB — LIPASE, BLOOD: Lipase: 25 U/L (ref 11–51)

## 2017-05-12 LAB — OCCULT BLOOD GASTRIC / DUODENUM (SPECIMEN CUP)
Occult Blood, Gastric: NEGATIVE
pH, Gastric: 2

## 2017-05-12 LAB — I-STAT BETA HCG BLOOD, ED (MC, WL, AP ONLY): I-stat hCG, quantitative: <5 m[IU]/mL (ref <5)

## 2017-05-12 LAB — TROPONIN I: Troponin I: <0.03 ng/mL (ref <0.03)

## 2017-05-12 MED ORDER — SODIUM CHLORIDE 0.9 % IV SOLN
Freq: Once | INTRAVENOUS | Status: AC
  Administered 2017-05-12: 16:00:00 via INTRAVENOUS

## 2017-05-12 MED ORDER — DIPHENHYDRAMINE HCL 25 MG PO TABS: 25.0000 mg | ORAL_TABLET | Freq: Four times a day (QID) | ORAL | 0 refills | Status: DC

## 2017-05-12 MED ORDER — LABETALOL HCL 5 MG/ML IV SOLN
10.0000 mg | Freq: Once | INTRAVENOUS | Status: AC
Start: 2017-05-12 — End: 2017-05-12
  Administered 2017-05-12: 10 mg via INTRAVENOUS
  Filled 2017-05-12: qty 4

## 2017-05-12 MED ORDER — METOCLOPRAMIDE HCL 10 MG PO TABS: 10.0000 mg | ORAL_TABLET | Freq: Four times a day (QID) | ORAL | 0 refills | Status: DC

## 2017-05-12 MED ORDER — SODIUM CHLORIDE 0.9 % IV BOLUS (SEPSIS)
1000.0000 mL | Freq: Once | INTRAVENOUS | Status: AC
Start: 2017-05-12 — End: 2017-05-12
  Administered 2017-05-12: 1000 mL via INTRAVENOUS

## 2017-05-12 MED ORDER — METOCLOPRAMIDE HCL 5 MG/ML IJ SOLN
10.0000 mg | Freq: Once | INTRAMUSCULAR | Status: AC
  Administered 2017-05-12: 10 mg via INTRAVENOUS
  Filled 2017-05-12: qty 2

## 2017-05-12 MED ORDER — DILTIAZEM HCL ER COATED BEADS 120 MG PO CP24
120.0000 mg | ORAL_CAPSULE | Freq: Once | ORAL | Status: AC
  Administered 2017-05-12: 120 mg via ORAL
  Filled 2017-05-12: qty 1

## 2017-05-12 MED ORDER — ONDANSETRON 4 MG PO TBDP
4.0000 mg | ORAL_TABLET | Freq: Once | ORAL | Status: AC
Start: 2017-05-12 — End: 2017-05-12
  Administered 2017-05-12: 4 mg via ORAL
  Filled 2017-05-12: qty 1

## 2017-05-12 MED ORDER — DIPHENHYDRAMINE HCL 50 MG/ML IJ SOLN
25.0000 mg | Freq: Once | INTRAMUSCULAR | Status: AC
  Administered 2017-05-12: 25 mg via INTRAVENOUS
  Filled 2017-05-12: qty 1

## 2017-05-12 MED ORDER — ONDANSETRON 4 MG PO TBDP
4.0000 mg | ORAL_TABLET | Freq: Once | ORAL | Status: AC | PRN
  Administered 2017-05-12: 4 mg via ORAL
  Filled 2017-05-12: qty 1

## 2017-05-12 NOTE — ED Notes (Signed)
Called lab and spoke Ace to add on trop

## 2017-05-12 NOTE — ED Notes (Signed)
194[facility]106 (132[facility]

## 2017-05-12 NOTE — ED Provider Notes (Signed)
[facility]-EMERGENCY DEPT Provider Note   CSN: [id number] Arrival date & time: 05/12/17  0847     History   Chief Complaint Chief Complaint  Patient presents with  . Emesis    HPI Dawn Braun is a 52 y.o. female.  HPI   Patient is a 52 year old female with a history of type 2 diabetes mellitus, asthma, paroxysmal A. fib (anticoagulated on Eliquis and rate controlled with diltiazem and flecainide) presenting for intractable emesis for 24 hours.  Patient reports that she is vomiting several times an hour.  Patient reports that she noted that her emesis has been dark.  Patient denies any focal abdominal pain, diarrhea.  Patient denies any fevers, chills, chest pain, shortness of breath, dizziness, lightheadedness, or syncope.  Patient denies any sick contacts.  Patient reports that approximately 4 months ago she had her gallbladder taken out and has not had any dramatic episodes since that time.  Previously, patient had issues with intractable nausea or vomiting.  Patient does report that she has a history of ulcers in her teen years but has not been bothered since.  Patient denies taking any naproxen or ibuprofen.  Patient reports that she does not drink alcohol.  Patient denies any melena or hematochezia recently.  Past Medical History:  Diagnosis Date  . Asthma   . Diabetes mellitus without complication (HCC)    type 2  . Paroxysmal atrial fibrillation [facility](HCC)     Patient Active Problem List   Diagnosis Date Noted  . Hypokalemia 01/02/2017  . SIRS (systemic inflammatory response syndrome) (HCC) 12/31/2016  . Hemoperitoneum 12/31/2016  . Nausea & vomiting 12/31/2016  . AKI (acute kidney injury) (HCC) 12/31/2016  . Paroxysmal atrial fibrillation (HCC)   . Asthma   . Diabetes mellitus without complication (HCC) 12/23/2016    Past Surgical History:  Procedure Laterality Date  . CHOLECYSTECTOMY N/A 12/23/2016   Procedure: LAPAROSCOPIC CHOLECYSTECTOMY;   Surgeon: Kinsinger, De BlanchLuke Aaron, MD;  Location: [facility];  Service: General;  Laterality: N/A;  . EYE SURGERY Right   . KNEE ARTHROSCOPY Right   . SPLIT NIGHT STUDY  08/06/2015  . TUBAL LIGATION      OB History    No data available       Home Medications    Prior to Admission medications   Medication Sig Start Date End Date Taking? Authorizing Provider  apixaban (ELIQUIS) 5 MG TABS tablet Take 1 tablet (5 mg total) by mouth 2 (two) times daily. 07/11/15  Yes Newman Niparroll, Donna C, NP  diltiazem (CARDIZEM) 30 MG tablet Cardizem 30mg  -- take 1 tablet by mouth every 4 hours AS NEEDED for heart rate >100 as long as blood pressure >100. 07/01/15  Yes Newman Niparroll, Donna C, NP  diltiazem (CARTIA XT) 120 MG 24 hr capsule Take 1 capsule (120 mg total) by mouth daily. 01/29/17  Yes Newman Niparroll, Donna C, NP  flecainide (TAMBOCOR) 100 MG tablet TAKE 1 TABLET(100 MG) BY MOUTH TWICE DAILY 04/05/17  Yes Newman Niparroll, Donna C, NP  VICTOZA 18 MG/3ML SOPN Inject 1.8 mg into the skin daily. 08/17/15  Yes [provider]  docusate sodium (COLACE) 100 MG capsule Take 1 capsule (100 mg total) by mouth 2 (two) times daily. Patient not taking: Reported on 05/12/2017 12/29/16   Berna Bueonnor, Chelsea A, MD  ELIQUIS 5 MG TABS tablet TAKE 1 TABLET(5 MG) BY MOUTH TWICE DAILY Patient not taking: Reported on 05/12/2017 03/23/17   Newman Niparroll, Donna C, NP  flecainide (TAMBOCOR) 100 MG tablet  Take 1 tablet (100 mg total) by mouth 2 (two) times daily. Patient not taking: Reported on 05/12/2017 01/29/17   Newman Nip, NP  metoprolol tartrate (LOPRESSOR) 25 MG tablet Take 1 tablet (25 mg total) by mouth 2 (two) times daily. Patient not taking: Reported on 05/12/2017 01/29/17   Newman Nip, NP  potassium chloride 20 MEQ/15ML (10%) SOLN Take 15 mLs (20 mEq total) by mouth 2 (two) times daily. Patient not taking: Reported on 05/12/2017 01/05/17 01/08/17  Pricilla Loveless, MD    Family History Family History  Problem Relation Age of Onset  .  Epilepsy Father   . Diabetes Mother   . Kidney disease Mother   . Hypertension Mother   . Kidney disease Brother   . Diabetic kidney disease Brother   . Diabetes Brother     Social History Social History   Tobacco Use  . Smoking status: Never Smoker  . Smokeless tobacco: Never Used  Substance Use Topics  . Alcohol use: Yes    Comment: occasional  . Drug use: No     Allergies   Patient has no known allergies.   Review of Systems Review of Systems  Constitutional: Negative for chills and fever.  HENT: Negative for congestion and sore throat.   Eyes: Negative for visual disturbance.  Respiratory: Negative for cough, chest tightness and shortness of breath.   Cardiovascular: Negative for chest pain and leg swelling.  Gastrointestinal: Positive for nausea and vomiting. Negative for abdominal pain and blood in stool.  Genitourinary: Negative for dysuria and flank pain.  Musculoskeletal: Negative for myalgias.  Skin: Negative for rash.  Neurological: Negative for dizziness, syncope, light-headedness and headaches.     Physical Exam Updated Vital Signs BP (!) 214/99 (BP Location: Left Arm)   Pulse 87   Temp 98.9 F (37.2 C) (Oral)   Resp 12   SpO2 100%   Physical Exam  Constitutional: She appears well-developed and well-nourished. No distress.  HENT:  Head: Normocephalic and atraumatic.  Mouth/Throat: Oropharynx is clear and moist.  Eyes: Conjunctivae and EOM are normal. Pupils are equal, round, and reactive to light.  Neck: Normal range of motion. Neck supple.  Cardiovascular: Normal rate, regular rhythm, S1 normal and S2 normal.  No murmur heard. Pulmonary/Chest: Effort normal and breath sounds normal. She has no wheezes. She has no rales.  Abdominal: Soft. She exhibits no distension. There is no tenderness. There is no guarding.  Active emetic episode of dark emesis observed in room.  Musculoskeletal: Normal range of motion. She exhibits no edema or deformity.   Lymphadenopathy:    She has no cervical adenopathy.  Neurological: She is alert.  Cranial nerves grossly intact. Patient moves extremities symmetrically and with good coordination.  Skin: Skin is warm and dry. No rash noted. No erythema.  Psychiatric: She has a normal mood and affect. Her behavior is normal. Judgment and thought content normal.  Nursing note and vitals reviewed.    ED Treatments / Results  Labs (all labs ordered are listed, but only abnormal results are displayed) Labs Reviewed  COMPREHENSIVE METABOLIC PANEL - Abnormal; Notable for the following components:      Result Value   Glucose, Bld 180 (*)    BUN 24 (*)    Creatinine, Ser 1.01 (*)    Total Protein 8.9 (*)    All other components within normal limits  CBC - Abnormal; Notable for the following components:   WBC 12.1 (*)    All other  components within normal limits  LIPASE, BLOOD  TROPONIN I  OCCULT BLOOD GASTRIC / DUODENUM (SPECIMEN CUP)  URINALYSIS, ROUTINE W REFLEX MICROSCOPIC  I-STAT BETA HCG BLOOD, ED ([facility], [facility], [facility] ONLY)    EKG  EKG Interpretation None       Radiology No results found.  Procedures Procedures (including critical care time)  Medications Ordered in ED Medications  ondansetron (ZOFRAN-ODT) disintegrating tablet 4 mg (4 mg Oral Given 05/12/17 0943)  sodium chloride 0.9 % bolus 1,000 mL (0 mLs Intravenous Stopped 05/12/17 1615)  0.9 %  sodium chloride infusion ( Intravenous Stopped 05/12/17 1739)  metoCLOPramide (REGLAN) injection 10 mg (10 mg Intravenous Given 05/12/17 1252)  diphenhydrAMINE (BENADRYL) injection 25 mg (25 mg Intravenous Given 05/12/17 1251)  labetalol (NORMODYNE,TRANDATE) injection 10 mg (10 mg Intravenous Given 05/12/17 1420)  diltiazem (CARDIZEM CD) 24 hr capsule 120 mg (120 mg Oral Given 05/12/17 1616)  labetalol (NORMODYNE,TRANDATE) injection 10 mg (10 mg Intravenous Given 05/12/17 1619)  ondansetron (ZOFRAN-ODT) disintegrating tablet 4 mg (4 mg Oral Given 05/12/17 1726)      Initial Impression / Assessment and Plan / ED Course  I have reviewed the triage vital signs and the nursing notes.  Pertinent labs & imaging results that were available during my care of the patient were reviewed by me and considered in my medical decision making (see chart for details).    Final Clinical Impressions(s) / ED Diagnoses   Final diagnoses:  Acute vomiting   Patient is nontoxic-appearing, afebrile, no acute distress.  Patient is significant hypertensive with systolic readings in the 200s.  Differential diagnosis includes DKA, diabetic gastroparesis, viral gastroenteritis, ACS, pregnancy.  Patient is on diltiazem and flecainide.  Patient has not been able to keep down her diltiazem due to her illness.  Will order 10 of IV labetalol to control blood pressure here.  Will initiate Reglan and Benadryl as antiemetic.  Will initiate fluid repletion with a liter bolus of normal saline.  Gastric occult study is negative. Patient is a slight leukocytosis of 12.1 without left shift.  Patient has normal potassium and sodium.  Troponin is negative.  Creatinine is consistent with prior readings at 1.03, however BUN is slightly elevated at 24.  This is likely prerenal due to dehydration secondary to vomiting. Istat hcg is negative.  Patient had resolution of nausea and vomiting with Reglan and Benadryl.  Workup thus far reveals no cardiac, or metabolic cause of her vomiting.  Suspect that this is diabetic gastroparesis versus viral gastroneuritis.  Due to patient significant hypertension, will give another 10 mg of labetalol as well as patient's home dose of 120 mg of Cartia.  Discussed hypertension with Dr. Iantha Fallen who feels that if patient is asymptomatic, can follow-up with home regimen and PCP.  Patient given multiple antihypertensives in the ED, including missed home dose of Cartia.  I discussed with patient, as well as repeat BUN and creatinine by her primary care provider.  Patient  care was signed out  To [facility], PA-C, who will follow the EKG results and discharge patient.  This is a supervised visit with Dr. Linwood Dibbles. Evaluation, management, and discharge planning discussed with this attending physician.   ED Discharge Orders    None       Delia Chimes 05/12/17 1831    Linwood Dibbles, MD 05/13/17 [phone]

## 2017-05-12 NOTE — ED Provider Notes (Signed)
[facility] Please see note for further details. Case discussed, plan agreed upon. EKG pending, if negative, then safe for discharge.   EKG obtained and reviewed by attending, Dr. Erma HeritageIsaacs. NSR. Patient discharged with instructions per previous provider.    Ward, Chase PicketJaime Pilcher, PA-C 05/12/17 1721    Shaune PollackIsaacs, Cameron, MD 05/13/17 [phone]

## 2017-05-12 NOTE — Discharge Instructions (Signed)
Please read and follow all provided instructions.  Your diagnoses today include:  1. Acute vomiting    There are many causes of vomiting.  Fortunately, we have not found any acute cause of this.  Your kidney function is showing he may be slightly dehydrated, and I would like your kidney function be rechecked in 1 week.  We have given your dose of Cardizem today.  You do not need to take it tonight.  Tests performed today include: Blood counts and electrolytes Blood tests to check liver and kidney function Blood tests to check pancreas function Urine test to look for infection and pregnancy (in women) Vital signs. See below for your results today.   Medications prescribed:   Take any prescribed medications only as directed.  Home care instructions:  Follow any educational materials contained in this packet.  Your abdominal pain, nausea, vomiting, and diarrhea may be caused by a viral gastroenteritis also called 'stomach flu'. You should rest for the next several days. Keep drinking plenty of fluids and use the medicine for nausea as directed.   Drink clear liquids for the next 24 hours and introduce solid foods slowly after 24 hours using the b.r.a.t. diet (Bananas, Rice, Applesauce, Toast, Yogurt).    Follow-up instructions: Please follow-up with your primary care provider in the next 2 days for further evaluation of your symptoms. If you are not feeling better in 48 hours you may have a condition that is more serious and you need re-evaluation.   Return instructions:  SEEK IMMEDIATE MEDICAL ATTENTION IF: If you have pain that does not go away or becomes severe  A temperature above 101F develops  Repeated vomiting occurs (multiple episodes)  If you have pain that becomes localized to portions of the abdomen. The right side could possibly be appendicitis. In an adult, the left lower portion of the abdomen could be colitis or diverticulitis.  Blood is being passed in stools or vomit  (bright red or black tarry stools)  You develop chest pain, difficulty breathing, dizziness or fainting, or become confused, poorly responsive, or inconsolable (young children) If you have any other emergent concerns regarding your health  Additional Information: Abdominal (belly) pain can be caused by many things. Your caregiver performed an examination and possibly ordered blood/urine tests and imaging (CT scan, x-rays, ultrasound). Many cases can be observed and treated at home after initial evaluation in the emergency department. Even though you are being discharged home, abdominal pain can be unpredictable. Therefore, you need a repeated exam if your pain does not resolve, returns, or worsens. Most patients with abdominal pain don't have to be admitted to the hospital or have surgery, but serious problems like appendicitis and gallbladder attacks can start out as nonspecific pain. Many abdominal conditions cannot be diagnosed in one visit, so follow-up evaluations are very important.  Your vital signs today were: BP (!) 202/92 Comment: resp 18   Pulse 98 Comment: resp 18   Temp 98.9 F (37.2 C) (Oral)    Resp 12    SpO2 100% Comment: resp 18 If your blood pressure (bp) was elevated above 135/85 this visit, please have this repeated by your doctor within one month. --------------

## 2017-05-12 NOTE — ED Triage Notes (Signed)
Pt complains of vomiting since yesterday, which increased today. Pt denies abdominal pain or diarrhea. Pt had gallbladder removed last year.

## 2017-05-12 NOTE — ED Notes (Signed)
PO Challenge given. 

## 2017-05-12 NOTE — ED Notes (Signed)
Tol a few sips, a little nausea but no vomiting

## 2017-05-16 ENCOUNTER — Other Ambulatory Visit: Payer: Self-pay

## 2017-05-16 ENCOUNTER — Emergency Department (HOSPITAL_COMMUNITY)
Admission: EM | Admit: 2017-05-16 | Discharge: 2017-05-16 | Disposition: A | Discharge status: Home / Self Care | Payer: Managed Care, Other (non HMO) | Source: Home / Self Care | Attending: Emergency Medicine | Admitting: Emergency Medicine

## 2017-05-16 ENCOUNTER — Encounter (HOSPITAL_COMMUNITY): Payer: Self-pay | Admitting: *Deleted

## 2017-05-16 ENCOUNTER — Emergency Department (HOSPITAL_COMMUNITY): Payer: Managed Care, Other (non HMO)

## 2017-05-16 DIAGNOSIS — Z79899 Other long term (current) drug therapy: Secondary | ICD-10-CM | POA: Insufficient documentation

## 2017-05-16 DIAGNOSIS — R531 Weakness: Secondary | ICD-10-CM

## 2017-05-16 DIAGNOSIS — R0602 Shortness of breath: Secondary | ICD-10-CM | POA: Insufficient documentation

## 2017-05-16 DIAGNOSIS — R42 Dizziness and giddiness: Secondary | ICD-10-CM

## 2017-05-16 DIAGNOSIS — Z7901 Long term (current) use of anticoagulants: Secondary | ICD-10-CM

## 2017-05-16 DIAGNOSIS — N179 Acute kidney failure, unspecified: Secondary | ICD-10-CM | POA: Diagnosis not present

## 2017-05-16 DIAGNOSIS — E119 Type 2 diabetes mellitus without complications: Secondary | ICD-10-CM

## 2017-05-16 DIAGNOSIS — I4891 Unspecified atrial fibrillation: Secondary | ICD-10-CM | POA: Insufficient documentation

## 2017-05-16 LAB — CBC
HCT: 42.9 % (ref 36.0–46.0)
Hemoglobin: 14.4 g/dL (ref 12.0–15.0)
MCH: 28.2 pg (ref 26.0–34.0)
MCHC: 33.6 g/dL (ref 30.0–36.0)
MCV: 84 fL (ref 78.0–100.0)
Platelets: 383 10*3/uL (ref 150–400)
RBC: 5.11 MIL/uL (ref 3.87–5.11)
RDW: 14.5 % (ref 11.5–15.5)
WBC: 16.2 10*3/uL — ABNORMAL HIGH (ref 4.0–10.5)

## 2017-05-16 LAB — URINALYSIS, ROUTINE W REFLEX MICROSCOPIC
Bilirubin Urine: NEGATIVE
Glucose, UA: NEGATIVE mg/dL
Ketones, ur: 5 mg/dL — AB
Leukocytes, UA: NEGATIVE
Nitrite: NEGATIVE
Protein, ur: 100 mg/dL — AB
Specific Gravity, Urine: 1.01 (ref 1.005–1.030)
pH: 8 (ref 5.0–8.0)

## 2017-05-16 LAB — BASIC METABOLIC PANEL
Anion gap: 12 (ref 5–15)
BUN: 29 mg/dL — ABNORMAL HIGH (ref 6–20)
CO2: 22 mmol/L (ref 22–32)
Calcium: 9.1 mg/dL (ref 8.9–10.3)
Chloride: 105 mmol/L (ref 101–111)
Creatinine, Ser: 1.22 mg/dL — ABNORMAL HIGH (ref 0.44–1.00)
GFR calc Af Amer: 58 mL/min — ABNORMAL LOW (ref >60)
GFR calc non Af Amer: 50 mL/min — ABNORMAL LOW (ref >60)
Glucose, Bld: 204 mg/dL — ABNORMAL HIGH (ref 65–99)
Potassium: 3.7 mmol/L (ref 3.5–5.1)
Sodium: 139 mmol/L (ref 135–145)

## 2017-05-16 LAB — DIFFERENTIAL
Basophils Absolute: 0 10*3/uL (ref 0.0–0.1)
Basophils Relative: 0 %
Eosinophils Absolute: 0.1 10*3/uL (ref 0.0–0.7)
Eosinophils Relative: 1 %
Lymphocytes Relative: 32 %
Lymphs Abs: 5.2 10*3/uL — ABNORMAL HIGH (ref 0.7–4.0)
Monocytes Absolute: 1.1 10*3/uL — ABNORMAL HIGH (ref 0.1–1.0)
Monocytes Relative: 7 %
Neutro Abs: 9.9 10*3/uL — ABNORMAL HIGH (ref 1.7–7.7)
Neutrophils Relative %: 60 %

## 2017-05-16 LAB — I-STAT BETA HCG BLOOD, ED (MC, WL, AP ONLY): I-stat hCG, quantitative: <5 m[IU]/mL (ref <5)

## 2017-05-16 LAB — HEPATIC FUNCTION PANEL
ALT: 72 U/L — ABNORMAL HIGH (ref 14–54)
AST: 47 U/L — ABNORMAL HIGH (ref 15–41)
Albumin: 3.9 g/dL (ref 3.5–5.0)
Alkaline Phosphatase: 112 U/L (ref 38–126)
Bilirubin, Direct: 0.3 mg/dL (ref 0.1–0.5)
Indirect Bilirubin: 0.9 mg/dL (ref 0.3–0.9)
Total Bilirubin: 1.2 mg/dL (ref 0.3–1.2)
Total Protein: 8.1 g/dL (ref 6.5–8.1)

## 2017-05-16 LAB — I-STAT TROPONIN, ED: Troponin i, poc: 0 ng/mL (ref 0.00–0.08)

## 2017-05-16 LAB — CBG MONITORING, ED: Glucose-Capillary: 166 mg/dL — ABNORMAL HIGH (ref 65–99)

## 2017-05-16 LAB — D-DIMER, QUANTITATIVE: D-Dimer, Quant: 0.43 ug/mL-FEU (ref 0.00–0.50)

## 2017-05-16 MED ORDER — SODIUM CHLORIDE 0.9 % IV BOLUS (SEPSIS)
2000.0000 mL | Freq: Once | INTRAVENOUS | Status: AC
  Administered 2017-05-16: 2000 mL via INTRAVENOUS

## 2017-05-16 MED ORDER — DILTIAZEM HCL ER COATED BEADS 120 MG PO CP24
120.0000 mg | ORAL_CAPSULE | Freq: Every day | ORAL | Status: DC
  Administered 2017-05-16: 120 mg via ORAL
  Filled 2017-05-16: qty 1

## 2017-05-16 MED ORDER — FLECAINIDE ACETATE 100 MG PO TABS
100.0000 mg | ORAL_TABLET | Freq: Once | ORAL | Status: AC
  Administered 2017-05-16: 100 mg via ORAL
  Filled 2017-05-16: qty 1

## 2017-05-16 MED ORDER — METOPROLOL TARTRATE 25 MG PO TABS
25.0000 mg | ORAL_TABLET | Freq: Once | ORAL | Status: AC
  Administered 2017-05-16: 25 mg via ORAL
  Filled 2017-05-16: qty 1

## 2017-05-16 NOTE — ED Notes (Signed)
Patient is aware that urine sample is needed, she is very dehydrated and is currently getting fluids. Will notify staff when she is able too void

## 2017-05-16 NOTE — ED Triage Notes (Addendum)
Pt states she was seen on Wednesday, today feels shob, and can not stand up. VS WNL in triage

## 2017-05-16 NOTE — ED Provider Notes (Signed)
[facility]-EMERGENCY DEPT Provider Note   CSN: [id number] Arrival date & time: 05/16/17  1103     History   Chief Complaint Chief Complaint  Patient presents with  . Weakness  . Shortness of Breath    HPI TERRIYAH WESTRA is a 52 y.o. female.  Patient complains of weakness.  She has been vomiting for a long time and that stopped Friday.  But she got up today and she felt weak dizzy   The history is provided by the patient.  Weakness  Primary symptoms include focal weakness. This is a new problem. The current episode started 6 to 12 hours ago. The problem has not changed since onset.There was no focality noted. There has been no fever. Associated symptoms include shortness of breath. Pertinent negatives include no chest pain and no headaches. There were no medications administered prior to arrival.  Shortness of Breath  Pertinent negatives include no headaches, no cough, no chest pain, no abdominal pain and no rash.    Past Medical History:  Diagnosis Date  . Asthma   . Diabetes mellitus without complication (HCC)    type 2  . Paroxysmal atrial fibrillation [facility])     Patient Active Problem List   Diagnosis Date Noted  . Hypokalemia 01/02/2017  . SIRS (systemic inflammatory response syndrome) (HCC) 12/31/2016  . Hemoperitoneum 12/31/2016  . Nausea & vomiting 12/31/2016  . AKI (acute kidney injury) (HCC) 12/31/2016  . Paroxysmal atrial fibrillation (HCC)   . Asthma   . Diabetes mellitus without complication (HCC) 12/23/2016    Past Surgical History:  Procedure Laterality Date  . CHOLECYSTECTOMY N/A 12/23/2016   Procedure: LAPAROSCOPIC CHOLECYSTECTOMY;  Surgeon: Kinsinger, De Blanch, MD;  Location: [facility];  Service: General;  Laterality: N/A;  . EYE SURGERY Right   . KNEE ARTHROSCOPY Right   . SPLIT NIGHT STUDY  08/06/2015  . TUBAL LIGATION      OB History    No data available       Home Medications    Prior to Admission  medications   Medication Sig Start Date End Date Taking? Authorizing Provider  apixaban (ELIQUIS) 5 MG TABS tablet Take 1 tablet (5 mg total) by mouth 2 (two) times daily. 07/11/15  Yes Newman Nip, NP  diltiazem (CARTIA XT) 120 MG 24 hr capsule Take 1 capsule (120 mg total) by mouth daily. 01/29/17  Yes Newman Nip, NP  flecainide (TAMBOCOR) 100 MG tablet Take 1 tablet (100 mg total) by mouth 2 (two) times daily. 01/29/17  Yes Newman Nip, NP  VICTOZA 18 MG/3ML SOPN Inject 1.8 mg into the skin daily. 08/17/15  Yes [provider]  diltiazem (CARDIZEM) 30 MG tablet Cardizem 30mg  -- take 1 tablet by mouth every 4 hours AS NEEDED for heart rate >100 as long as blood pressure >100. Patient not taking: Reported on 05/16/2017 07/01/15   Newman Nip, NP  diphenhydrAMINE (BENADRYL) 25 MG tablet Take 1 tablet (25 mg total) by mouth every 6 (six) hours. Patient not taking: Reported on 05/16/2017 05/12/17   Aviva Kluver B, PA-C  docusate sodium (COLACE) 100 MG capsule Take 1 capsule (100 mg total) by mouth 2 (two) times daily. Patient not taking: Reported on 05/12/2017 12/29/16   Berna Bue, MD  metoCLOPramide (REGLAN) 10 MG tablet Take 1 tablet (10 mg total) by mouth every 6 (six) hours for 4 days. Patient not taking: Reported on 05/16/2017 05/12/17 05/16/17  Elisha Ponder, PA-C  metoprolol tartrate (LOPRESSOR) 25 MG tablet Take 1 tablet (25 mg total) by mouth 2 (two) times daily. Patient not taking: Reported on 05/12/2017 01/29/17   Newman Niparroll, Donna C, NP  potassium chloride 20 MEQ/15ML (10%) SOLN Take 15 mLs (20 mEq total) by mouth 2 (two) times daily. Patient not taking: Reported on 05/12/2017 01/05/17 01/08/17  Pricilla LovelessGoldston, Scott, MD    Family History Family History  Problem Relation Age of Onset  . Epilepsy Father   . Diabetes Mother   . Kidney disease Mother   . Hypertension Mother   . Kidney disease Brother   . Diabetic kidney disease Brother   . Diabetes Brother      Social History Social History   Tobacco Use  . Smoking status: Never Smoker  . Smokeless tobacco: Never Used  Substance Use Topics  . Alcohol use: Yes    Comment: occasional  . Drug use: No     Allergies   Patient has no known allergies.   Review of Systems Review of Systems  Constitutional: Negative for appetite change and fatigue.  HENT: Negative for congestion, ear discharge and sinus pressure.   Eyes: Negative for discharge.  Respiratory: Positive for shortness of breath. Negative for cough.   Cardiovascular: Negative for chest pain.  Gastrointestinal: Negative for abdominal pain and diarrhea.  Genitourinary: Negative for frequency and hematuria.  Musculoskeletal: Negative for back pain.  Skin: Negative for rash.  Neurological: Positive for focal weakness and weakness. Negative for seizures and headaches.  Psychiatric/Behavioral: Negative for hallucinations.     Physical Exam Updated Vital Signs BP (!) 198/168   Pulse 82   Temp 97.9 F (36.6 C) (Oral)   Resp (!) 23   Ht 5\' 2"  (1.575 m)   Wt 88.5 kg (195 lb)   SpO2 100%   BMI 35.67 kg/m   Physical Exam  Constitutional: She is oriented to person, place, and time. She appears well-developed.  HENT:  Head: Normocephalic.  Eyes: Conjunctivae and EOM are normal. No scleral icterus.  Neck: Neck supple. No thyromegaly present.  Cardiovascular: Normal rate and regular rhythm. Exam reveals no gallop and no friction rub.  No murmur heard. Pulmonary/Chest: No stridor. She has no wheezes. She has no rales. She exhibits no tenderness.  Abdominal: She exhibits no distension. There is no tenderness. There is no rebound.  Musculoskeletal: Normal range of motion. She exhibits no edema.  Lymphadenopathy:    She has no cervical adenopathy.  Neurological: She is oriented to person, place, and time. She exhibits normal muscle tone. Coordination normal.  Skin: No rash noted. No erythema.  Psychiatric: She has a normal  mood and affect. Her behavior is normal.     ED Treatments / Results  Labs (all labs ordered are listed, but only abnormal results are displayed) Labs Reviewed  BASIC METABOLIC PANEL - Abnormal; Notable for the following components:      Result Value   Glucose, Bld 204 (*)    BUN 29 (*)    Creatinine, Ser 1.22 (*)    GFR calc non Af Amer 50 (*)    GFR calc Af Amer 58 (*)    All other components within normal limits  URINALYSIS, ROUTINE W REFLEX MICROSCOPIC - Abnormal; Notable for the following components:   Hgb urine dipstick LARGE (*)    Ketones, ur 5 (*)    Protein, ur 100 (*)    Bacteria, UA RARE (*)    Squamous Epithelial / LPF 0-5 (*)  All other components within normal limits  HEPATIC FUNCTION PANEL - Abnormal; Notable for the following components:   AST 47 (*)    ALT 72 (*)    All other components within normal limits  CBC - Abnormal; Notable for the following components:   WBC 16.2 (*)    All other components within normal limits  DIFFERENTIAL - Abnormal; Notable for the following components:   Neutro Abs 9.9 (*)    Lymphs Abs 5.2 (*)    Monocytes Absolute 1.1 (*)    All other components within normal limits  CBG MONITORING, ED - Abnormal; Notable for the following components:   Glucose-Capillary 166 (*)    All other components within normal limits  D-DIMER, QUANTITATIVE (NOT AT [facility])  I-STAT BETA HCG BLOOD, ED ([facility], [facility], [facility] ONLY)  I-STAT TROPONIN, ED    EKG  EKG Interpretation  Date/Time:  Sunday May 16 2017 11:25:55 EST Ventricular Rate:  85 PR Interval:    QRS Duration: 89 QT Interval:  405 QTC Calculation: 482 R Axis:   134 Text Interpretation:  Sinus rhythm Probable left atrial enlargement Left posterior fascicular block Abnormal R-wave progression, late transition Confirmed by Bethann Berkshire [phone]) on 05/16/2017 4:11:11 PM       Radiology Dg Chest Port 1 View  Result Date: 05/16/2017 CLINICAL DATA:  Acute shortness of breath. EXAM:  PORTABLE CHEST 1 VIEW COMPARISON:  12/31/2016 and prior radiographs FINDINGS: The cardiomediastinal silhouette is unremarkable. Mild elevation of the right hemidiaphragm is again noted. There is no evidence of focal airspace disease, pulmonary edema, suspicious pulmonary nodule/mass, pleural effusion, or pneumothorax. No acute bony abnormalities are identified. IMPRESSION: No active disease. Electronically Signed   By: Harmon Pier M.D.   On: 05/16/2017 14:37    Procedures Procedures (including critical care time)  Medications Ordered in ED Medications  diltiazem (CARDIZEM CD) 24 hr capsule 120 mg (not administered)  metoprolol tartrate (LOPRESSOR) tablet 25 mg (not administered)  flecainide (TAMBOCOR) tablet 100 mg (not administered)  sodium chloride 0.9 % bolus 2,000 mL (0 mLs Intravenous Stopped 05/16/17 1610)     Initial Impression / Assessment and Plan / ED Course  I have reviewed the triage vital signs and the nursing notes.  Pertinent labs & imaging results that were available during my care of the patient were reviewed by me and considered in my medical decision making (see chart for details).    Patient initially came in hypotensive this was resolved with IV fluids.  Patient did complained of being short of breath chest x-ray was unremarkable.  Troponin and d-dimer are pending.  Patient was given her blood pressure medicine and her arrhythmia medicines.  Final Clinical Impressions(s) / ED Diagnoses   Final diagnoses:  None    ED Discharge Orders    None       Bethann Berkshire, MD 05/16/17 1616

## 2017-05-17 ENCOUNTER — Other Ambulatory Visit: Payer: Self-pay

## 2017-05-17 ENCOUNTER — Inpatient Hospital Stay (HOSPITAL_COMMUNITY)
Admission: AD | Admit: 2017-05-17 | Discharge: 2017-05-20 | DRG: 683 | Disposition: A | Discharge status: Home / Self Care | Payer: Managed Care, Other (non HMO) | Source: Ambulatory Visit | Attending: Internal Medicine | Admitting: Internal Medicine

## 2017-05-17 ENCOUNTER — Encounter (HOSPITAL_COMMUNITY): Payer: Self-pay | Admitting: Internal Medicine

## 2017-05-17 DIAGNOSIS — Z841 Family history of disorders of kidney and ureter: Secondary | ICD-10-CM | POA: Diagnosis not present

## 2017-05-17 DIAGNOSIS — R748 Abnormal levels of other serum enzymes: Secondary | ICD-10-CM | POA: Diagnosis present

## 2017-05-17 DIAGNOSIS — Z833 Family history of diabetes mellitus: Secondary | ICD-10-CM

## 2017-05-17 DIAGNOSIS — E1143 Type 2 diabetes mellitus with diabetic autonomic (poly)neuropathy: Secondary | ICD-10-CM | POA: Diagnosis present

## 2017-05-17 DIAGNOSIS — E860 Dehydration: Secondary | ICD-10-CM | POA: Diagnosis present

## 2017-05-17 DIAGNOSIS — E119 Type 2 diabetes mellitus without complications: Secondary | ICD-10-CM | POA: Diagnosis not present

## 2017-05-17 DIAGNOSIS — K3184 Gastroparesis: Secondary | ICD-10-CM | POA: Diagnosis present

## 2017-05-17 DIAGNOSIS — I480 Paroxysmal atrial fibrillation: Secondary | ICD-10-CM | POA: Diagnosis present

## 2017-05-17 DIAGNOSIS — E872 Acidosis: Secondary | ICD-10-CM | POA: Diagnosis present

## 2017-05-17 DIAGNOSIS — N179 Acute kidney failure, unspecified: Secondary | ICD-10-CM | POA: Diagnosis present

## 2017-05-17 DIAGNOSIS — R55 Syncope and collapse: Secondary | ICD-10-CM | POA: Diagnosis present

## 2017-05-17 DIAGNOSIS — I959 Hypotension, unspecified: Secondary | ICD-10-CM | POA: Diagnosis present

## 2017-05-17 DIAGNOSIS — Z8249 Family history of ischemic heart disease and other diseases of the circulatory system: Secondary | ICD-10-CM | POA: Diagnosis not present

## 2017-05-17 DIAGNOSIS — J45909 Unspecified asthma, uncomplicated: Secondary | ICD-10-CM | POA: Diagnosis present

## 2017-05-17 DIAGNOSIS — Z7901 Long term (current) use of anticoagulants: Secondary | ICD-10-CM

## 2017-05-17 DIAGNOSIS — E1169 Type 2 diabetes mellitus with other specified complication: Secondary | ICD-10-CM

## 2017-05-17 DIAGNOSIS — Z23 Encounter for immunization: Secondary | ICD-10-CM

## 2017-05-17 DIAGNOSIS — E869 Volume depletion, unspecified: Secondary | ICD-10-CM | POA: Diagnosis not present

## 2017-05-17 DIAGNOSIS — Z7984 Long term (current) use of oral hypoglycemic drugs: Secondary | ICD-10-CM | POA: Diagnosis not present

## 2017-05-17 DIAGNOSIS — I951 Orthostatic hypotension: Secondary | ICD-10-CM | POA: Diagnosis not present

## 2017-05-17 DIAGNOSIS — G43A1 Cyclical vomiting, intractable: Secondary | ICD-10-CM | POA: Diagnosis not present

## 2017-05-17 DIAGNOSIS — D72829 Elevated white blood cell count, unspecified: Secondary | ICD-10-CM | POA: Clinically undetermined

## 2017-05-17 DIAGNOSIS — Z820 Family history of epilepsy and other diseases of the nervous system: Secondary | ICD-10-CM | POA: Diagnosis not present

## 2017-05-17 DIAGNOSIS — E669 Obesity, unspecified: Secondary | ICD-10-CM

## 2017-05-17 DIAGNOSIS — R112 Nausea with vomiting, unspecified: Secondary | ICD-10-CM | POA: Diagnosis present

## 2017-05-17 HISTORY — DX: Type 2 diabetes mellitus without complications: E11.9

## 2017-05-17 HISTORY — DX: Adverse effect of unspecified anesthetic, initial encounter: T41.45XA

## 2017-05-17 HISTORY — DX: Other complications of anesthesia, initial encounter: T88.59XA

## 2017-05-17 HISTORY — DX: Essential (primary) hypertension: I10

## 2017-05-17 HISTORY — DX: Nausea with vomiting, unspecified: R11.2

## 2017-05-17 LAB — URINALYSIS, ROUTINE W REFLEX MICROSCOPIC
Bacteria, UA: NONE SEEN
Bilirubin Urine: NEGATIVE
Glucose, UA: >=500 mg/dL — AB
Ketones, ur: NEGATIVE mg/dL
Leukocytes, UA: NEGATIVE
Nitrite: NEGATIVE
Protein, ur: >=300 mg/dL — AB
Specific Gravity, Urine: 1.026 (ref 1.005–1.030)
pH: 6 (ref 5.0–8.0)

## 2017-05-17 LAB — COMPREHENSIVE METABOLIC PANEL
ALT: 62 U/L — ABNORMAL HIGH (ref 14–54)
AST: 34 U/L (ref 15–41)
Albumin: 3.2 g/dL — ABNORMAL LOW (ref 3.5–5.0)
Alkaline Phosphatase: 106 U/L (ref 38–126)
Anion gap: 13 (ref 5–15)
BUN: 24 mg/dL — ABNORMAL HIGH (ref 6–20)
CO2: 21 mmol/L — ABNORMAL LOW (ref 22–32)
Calcium: 8.9 mg/dL (ref 8.9–10.3)
Chloride: 106 mmol/L (ref 101–111)
Creatinine, Ser: 1.19 mg/dL — ABNORMAL HIGH (ref 0.44–1.00)
GFR calc Af Amer: 60 mL/min — ABNORMAL LOW (ref >60)
GFR calc non Af Amer: 52 mL/min — ABNORMAL LOW (ref >60)
Glucose, Bld: 278 mg/dL — ABNORMAL HIGH (ref 65–99)
Potassium: 3.3 mmol/L — ABNORMAL LOW (ref 3.5–5.1)
Sodium: 140 mmol/L (ref 135–145)
Total Bilirubin: 1.3 mg/dL — ABNORMAL HIGH (ref 0.3–1.2)
Total Protein: 7 g/dL (ref 6.5–8.1)

## 2017-05-17 LAB — CBC
HCT: 40.1 % (ref 36.0–46.0)
Hemoglobin: 13.2 g/dL (ref 12.0–15.0)
MCH: 28 pg (ref 26.0–34.0)
MCHC: 32.9 g/dL (ref 30.0–36.0)
MCV: 85 fL (ref 78.0–100.0)
Platelets: 316 10*3/uL (ref 150–400)
RBC: 4.72 MIL/uL (ref 3.87–5.11)
RDW: 14.5 % (ref 11.5–15.5)
WBC: 13 10*3/uL — ABNORMAL HIGH (ref 4.0–10.5)

## 2017-05-17 LAB — HEMOGLOBIN A1C
Hgb A1c MFr Bld: 6.4 % — ABNORMAL HIGH (ref 4.8–5.6)
Mean Plasma Glucose: 136.98 mg/dL

## 2017-05-17 LAB — GLUCOSE, CAPILLARY
Glucose-Capillary: 288 mg/dL — ABNORMAL HIGH (ref 65–99)
Glucose-Capillary: 146 mg/dL — ABNORMAL HIGH (ref 65–99)
Glucose-Capillary: 181 mg/dL — ABNORMAL HIGH (ref 65–99)
Glucose-Capillary: 118 mg/dL — ABNORMAL HIGH (ref 65–99)

## 2017-05-17 LAB — LIPASE, BLOOD: Lipase: 22 U/L (ref 11–51)

## 2017-05-17 LAB — TSH: TSH: 1.502 u[IU]/mL (ref 0.350–4.500)

## 2017-05-17 MED ORDER — METOCLOPRAMIDE HCL 5 MG/ML IJ SOLN
10.0000 mg | Freq: Three times a day (TID) | INTRAMUSCULAR | Status: DC
  Administered 2017-05-17 – 2017-05-18 (×3): 10 mg via INTRAVENOUS
  Filled 2017-05-17 (×3): qty 2

## 2017-05-17 MED ORDER — TRAZODONE HCL 50 MG PO TABS: 25.0000 mg | ORAL_TABLET | Freq: Every evening | ORAL | Status: DC | PRN

## 2017-05-17 MED ORDER — ACETAMINOPHEN 650 MG RE SUPP: 650.0000 mg | Freq: Four times a day (QID) | RECTAL | Status: DC | PRN

## 2017-05-17 MED ORDER — PANTOPRAZOLE SODIUM 40 MG IV SOLR
40.0000 mg | Freq: Two times a day (BID) | INTRAVENOUS | Status: DC
  Administered 2017-05-17 – 2017-05-20 (×6): 40 mg via INTRAVENOUS
  Filled 2017-05-17 (×6): qty 40

## 2017-05-17 MED ORDER — PNEUMOCOCCAL VAC POLYVALENT 25 MCG/0.5ML IJ INJ
0.5000 mL | INJECTION | INTRAMUSCULAR | Status: AC | PRN
  Administered 2017-05-20: 0.5 mL via INTRAMUSCULAR
  Filled 2017-05-17: qty 0.5

## 2017-05-17 MED ORDER — INSULIN ASPART 100 UNIT/ML ~~LOC~~ SOLN
0.0000 [IU] | SUBCUTANEOUS | Status: DC
  Administered 2017-05-17: 5 [IU] via SUBCUTANEOUS
  Administered 2017-05-17: 2 [IU] via SUBCUTANEOUS
  Administered 2017-05-17: 1 [IU] via SUBCUTANEOUS
  Administered 2017-05-18: 2 [IU] via SUBCUTANEOUS
  Administered 2017-05-18: 1 [IU] via SUBCUTANEOUS

## 2017-05-17 MED ORDER — INSULIN ASPART 100 UNIT/ML ~~LOC~~ SOLN: 0.0000 [IU] | Freq: Every day | SUBCUTANEOUS | Status: DC

## 2017-05-17 MED ORDER — BISACODYL 5 MG PO TBEC
5.0000 mg | DELAYED_RELEASE_TABLET | Freq: Every day | ORAL | Status: DC | PRN
Start: 2017-05-17 — End: 2017-05-20

## 2017-05-17 MED ORDER — ONDANSETRON HCL 4 MG/2ML IJ SOLN
4.0000 mg | Freq: Four times a day (QID) | INTRAMUSCULAR | Status: AC
  Administered 2017-05-17 – 2017-05-18 (×3): 4 mg via INTRAVENOUS
  Filled 2017-05-17 (×3): qty 2

## 2017-05-17 MED ORDER — ACETAMINOPHEN 325 MG PO TABS: 650.0000 mg | ORAL_TABLET | Freq: Four times a day (QID) | ORAL | Status: DC | PRN

## 2017-05-17 MED ORDER — DILTIAZEM HCL ER COATED BEADS 120 MG PO CP24
120.0000 mg | ORAL_CAPSULE | Freq: Every day | ORAL | Status: DC
  Administered 2017-05-17 – 2017-05-20 (×4): 120 mg via ORAL
  Filled 2017-05-17 (×5): qty 1

## 2017-05-17 MED ORDER — METOPROLOL TARTRATE 25 MG PO TABS
25.0000 mg | ORAL_TABLET | Freq: Two times a day (BID) | ORAL | Status: DC
Start: 2017-05-17 — End: 2017-05-20
  Administered 2017-05-17 – 2017-05-20 (×6): 25 mg via ORAL
  Filled 2017-05-17 (×6): qty 1

## 2017-05-17 MED ORDER — SODIUM CHLORIDE 0.9 % IV BOLUS (SEPSIS)
500.0000 mL | Freq: Once | INTRAVENOUS | Status: AC
  Administered 2017-05-17: 500 mL via INTRAVENOUS

## 2017-05-17 MED ORDER — APIXABAN 5 MG PO TABS
5.0000 mg | ORAL_TABLET | Freq: Two times a day (BID) | ORAL | Status: DC
  Administered 2017-05-17 – 2017-05-20 (×6): 5 mg via ORAL
  Filled 2017-05-17 (×7): qty 1

## 2017-05-17 MED ORDER — INSULIN ASPART 100 UNIT/ML ~~LOC~~ SOLN: 0.0000 [IU] | Freq: Three times a day (TID) | SUBCUTANEOUS | Status: DC

## 2017-05-17 MED ORDER — FLECAINIDE ACETATE 100 MG PO TABS: 100.0000 mg | ORAL_TABLET | Freq: Two times a day (BID) | ORAL | Status: DC

## 2017-05-17 MED ORDER — SODIUM CHLORIDE 0.9 % IV SOLN
INTRAVENOUS | Status: AC
  Administered 2017-05-17 – 2017-05-18 (×3): via INTRAVENOUS

## 2017-05-17 MED ORDER — HYDRALAZINE HCL 20 MG/ML IJ SOLN: 5.0000 mg | INTRAMUSCULAR | Status: DC | PRN

## 2017-05-17 NOTE — Progress Notes (Signed)
Dr. Ophelia CharterYates notified of arrivl to unit

## 2017-05-17 NOTE — H&P (Signed)
History and Physical    MARRIANA HIBBERD ZOX:[id number] DOB: September 28, 1965 DOA: 05/17/2017  PCP: Renford Dills, MD Patient coming from: home MD's office  Chief Complaint: persistent nausea and vomiting  HPI: Dawn Braun is a very pleasant 52 y.o. female with medical history significant for asthma, not on nebulizer or inhalers, paroxysmal A. Fib anticoagulated eliquis, diabetes taking victoza presents to room 19 [address] at [facility] from gastroenterology's office chief complaint persistent nausea and vomiting. [facility] are asked to admit  Information is obtained from the patient and the chart. She states 1 week ago she developed gradual worsening nausea experiencing several episodes of emesis. She denies coffee ground emesis. 5 days ago she went to the emergency department as she was not feeling any better. She was given IV fluids anti-emetics her condition improved and she was discharged with a follow-up appointment to gastroenterology. She states she "felt better for a while" but as she started eating and taken her medications and nausea vomiting returned. Associated symptoms include generalized weakness 2 episodes of loose stool, dizziness with position change decreased oral intake and shortness of breath. Also reports intermittent low-grade fever. Today she came to the emergency department complaining of generalized weakness and shortness of breath. Workup revealed mild elevated transaminases mild acute kidney injury. Provided with IV fluids and discharged to home. Today she went to her gastroenterology office for follow-up appointment with same complaints. Patient reports that he recommended she go home and she said "no". She denies chest pain palpitations cough abdominal pain lower extremity edema. She denies dysuria hematuria frequency or urgency.   ED Course: direct admit  Review of Systems: As per HPI otherwise all other systems reviewed and are negative.   Ambulatory  Status: Ambulates independently and is independent with ADLs  Past Medical History:  Diagnosis Date  . Asthma   . Diabetes mellitus without complication (HCC)    type 2  . Intractable nausea and vomiting   . Paroxysmal atrial fibrillation [facility])     Past Surgical History:  Procedure Laterality Date  . CHOLECYSTECTOMY N/A 12/23/2016   Procedure: LAPAROSCOPIC CHOLECYSTECTOMY;  Surgeon: Kinsinger, De Blanch, MD;  Location: [facility] ORS;  Service: General;  Laterality: N/A;  . EYE SURGERY Right   . KNEE ARTHROSCOPY Right   . SPLIT NIGHT STUDY  08/06/2015  . TUBAL LIGATION      Social History   Socioeconomic History  . Marital status: Single    Spouse name: Not on file  . Number of children: 2  . Years of education: Not on file  . Highest education level: Not on file  Social Needs  . Financial resource strain: Not on file  . Food insecurity - worry: Not on file  . Food insecurity - inability: Not on file  . Transportation needs - medical: Not on file  . Transportation needs - non-medical: Not on file  Occupational History  . Occupation: Radio broadcast assistant  Tobacco Use  . Smoking status: Never Smoker  . Smokeless tobacco: Never Used  Substance and Sexual Activity  . Alcohol use: Yes    Comment: occasional  . Drug use: No  . Sexual activity: No  Other Topics Concern  . Not on file  Social History Narrative  . Not on file    No Known Allergies  Family History  Problem Relation Age of Onset  . Epilepsy Father   . Diabetes Mother   . Kidney disease Mother   . Hypertension Mother   .  Kidney disease Brother   . Diabetic kidney disease Brother   . Diabetes Brother     Prior to Admission medications   Medication Sig Start Date End Date Taking? Authorizing Provider  apixaban (ELIQUIS) 5 MG TABS tablet Take 1 tablet (5 mg total) by mouth 2 (two) times daily. 07/11/15   Newman Niparroll, Donna C, NP  diltiazem (CARDIZEM) 30 MG tablet Cardizem 30mg  -- take 1 tablet by mouth every 4  hours AS NEEDED for heart rate >100 as long as blood pressure >100. Patient not taking: Reported on 05/16/2017 07/01/15   Newman Niparroll, Donna C, NP  diltiazem (CARTIA XT) 120 MG 24 hr capsule Take 1 capsule (120 mg total) by mouth daily. 01/29/17   Newman Niparroll, Donna C, NP  diphenhydrAMINE (BENADRYL) 25 MG tablet Take 1 tablet (25 mg total) by mouth every 6 (six) hours. Patient not taking: Reported on 05/16/2017 05/12/17   Aviva KluverMurray, Alyssa B, PA-C  docusate sodium (COLACE) 100 MG capsule Take 1 capsule (100 mg total) by mouth 2 (two) times daily. Patient not taking: Reported on 05/12/2017 12/29/16   Berna Bueonnor, Chelsea A, MD  flecainide (TAMBOCOR) 100 MG tablet Take 1 tablet (100 mg total) by mouth 2 (two) times daily. 01/29/17   Newman Niparroll, Donna C, NP  metoCLOPramide (REGLAN) 10 MG tablet Take 1 tablet (10 mg total) by mouth every 6 (six) hours for 4 days. Patient not taking: Reported on 05/16/2017 05/12/17 05/16/17  Aviva KluverMurray, Alyssa B, PA-C  metoprolol tartrate (LOPRESSOR) 25 MG tablet Take 1 tablet (25 mg total) by mouth 2 (two) times daily. Patient not taking: Reported on 05/12/2017 01/29/17   Newman Niparroll, Donna C, NP  potassium chloride 20 MEQ/15ML (10%) SOLN Take 15 mLs (20 mEq total) by mouth 2 (two) times daily. Patient not taking: Reported on 05/12/2017 01/05/17 01/08/17  Pricilla LovelessGoldston, Scott, MD  VICTOZA 18 MG/3ML SOPN Inject 1.8 mg into the skin daily. 08/17/15   [provider]    Physical Exam: Vitals:   05/17/17 1027  BP: (!) 179/88  Pulse: 79  Resp: 15  Temp: 98 F (36.7 C)  TempSrc: Oral  SpO2: 100%  Weight: 89.8 kg (198 lb)  Height: 5\' 2"  (1.575 m)     General:  Appears calm and comfortable in no acute distress Eyes:  PERRL, EOMI, normal lids, iris ENT:  grossly normal hearing, lips & tongue, mucous membranes of her mouth are pink slightly dry Neck:  no LAD, masses or thyromegaly Cardiovascular:  RRR, no m/r/g. No LE edema. Pedal pulses present and palpable Respiratory:  CTA bilaterally, no w/r/r.  Normal respiratory effort. Abdomen:  Nondistended soft positive bowel sounds but very sluggish old tenderness left upper quadrant to palpation otherwise nontender Skin:  no rash or induration seen on limited exam, warm and dry Musculoskeletal:  grossly normal tone BUE/BLE, good ROM, no bony abnormality Psychiatric:  grossly normal mood and affect, speech fluent and appropriate, AOx3 Neurologic:  CN 2-12 grossly intact, moves all extremities in coordinated fashion, sensation intact  Labs on Admission: I have personally reviewed following labs and imaging studies  CBC: Recent Labs  Lab 05/12/17 1208 05/16/17 1307  WBC 12.1* 16.2*  NEUTROABS  --  9.9*  HGB 14.1 14.4  HCT 40.8 42.9  MCV 81.1 84.0  PLT 373 383   Basic Metabolic Panel: Recent Labs  Lab 05/12/17 1208 05/16/17 1158  NA 141 139  K 3.5 3.7  CL 106 105  CO2 25 22  GLUCOSE 180* 204*  BUN 24* 29*  CREATININE  1.01* 1.22*  CALCIUM 9.5 9.1   GFR: Estimated Creatinine Clearance: 56.2 mL/min (A) (by C-G formula based on SCr of 1.22 mg/dL (H)). Liver Function Tests: Recent Labs  Lab 05/12/17 1208 05/16/17 1307  AST 31 47*  ALT 22 72*  ALKPHOS 115 112  BILITOT 0.7 1.2  PROT 8.9* 8.1  ALBUMIN 4.3 3.9   Recent Labs  Lab 05/12/17 1208  LIPASE 25   No results for input(s): AMMONIA in the last 168 hours. Coagulation Profile: No results for input(s): INR, PROTIME in the last 168 hours. Cardiac Enzymes: Recent Labs  Lab 05/12/17 1208  TROPONINI <0.03   BNP (last 3 results) No results for input(s): PROBNP in the last 8760 hours. HbA1C: No results for input(s): HGBA1C in the last 72 hours. CBG: Recent Labs  Lab 05/16/17 1125 05/17/17 1129  GLUCAP 166* 288*   Lipid Profile: No results for input(s): CHOL, HDL, LDLCALC, TRIG, CHOLHDL, LDLDIRECT in the last 72 hours. Thyroid Function Tests: No results for input(s): TSH, T4TOTAL, FREET4, T3FREE, THYROIDAB in the last 72 hours. Anemia Panel: No results for  input(s): VITAMINB12, FOLATE, FERRITIN, TIBC, IRON, RETICCTPCT in the last 72 hours. Urine analysis:    Component Value Date/Time   COLORURINE YELLOW 05/16/2017 1158   APPEARANCEUR CLEAR 05/16/2017 1158   LABSPEC 1.010 05/16/2017 1158   PHURINE 8.0 05/16/2017 1158   GLUCOSEU NEGATIVE 05/16/2017 1158   HGBUR LARGE (A) 05/16/2017 1158   BILIRUBINUR NEGATIVE 05/16/2017 1158   KETONESUR 5 (A) 05/16/2017 1158   PROTEINUR 100 (A) 05/16/2017 1158   NITRITE NEGATIVE 05/16/2017 1158   LEUKOCYTESUR NEGATIVE 05/16/2017 1158    Creatinine Clearance: Estimated Creatinine Clearance: 56.2 mL/min (A) (by C-G formula based on SCr of 1.22 mg/dL (H)).  Sepsis Labs: @LABRCNTIP (procalcitonin:4,lacticidven:4) )No results found for this or any previous visit (from the past 240 hour(s)).   Radiological Exams on Admission: Dg Chest Port 1 View  Result Date: 05/16/2017 CLINICAL DATA:  Acute shortness of breath. EXAM: PORTABLE CHEST 1 VIEW COMPARISON:  12/31/2016 and prior radiographs FINDINGS: The cardiomediastinal silhouette is unremarkable. Mild elevation of the right hemidiaphragm is again noted. There is no evidence of focal airspace disease, pulmonary edema, suspicious pulmonary nodule/mass, pleural effusion, or pneumothorax. No acute bony abnormalities are identified. IMPRESSION: No active disease. Electronically Signed   By: Harmon Pier M.D.   On: 05/16/2017 14:37    EKG: Independently reviewed.  Assessment/Plan Principal Problem:   Intractable nausea and vomiting Active Problems:   Diabetes mellitus without complication (HCC)   Paroxysmal atrial fibrillation (HCC)   Leukocytosis   #1. Intractable nausea and vomiting etiology unclear. She had a hospitalization for same 4 months ago shortly after she underwent a cholecystectomy. CT at that time showed possible enteritis vs mild small bowel ileus as well as hemoperitoneum. Recent labs reveal leukocytosis and acute kidney injury and she reports  subjective fever.  On admission she is afebrile somewhat hypertensive nontoxic appearing. -Admit to medical floor -Bowel rest -Scheduled Zofran IV -Scheduled Reglan IV -hold flecainide and victoza -IV fluids -Obtain a lipase and urinalysis -CBC, CMET -Check orthostatic vital signs -consider CT abdomen if no improvement  #2. Paroxysmal atrial fibrillation. Home medications include L request, diltiazem, flecainide and metoprolol [place] score 2 -Continue eliquis -Continue Cardizem -Continue Lopressor -EKG -Vital signs every 4 hours 3  #3. Diabetes. 3 of well controlled. Home medications include victoza -hold victoza -SSI for optimal control  #4. Hypertension. Likely related to above combined with not being able to take  her home medications. -Continue metoprolol, Cardizem -Monitor -When necessary hydralazine  #5. Leukocytosis. Reports subjective fever as well. Likely reactive. Recent urinalysis unremarkable. Recent chest x-ray without acute abnormality. He is hemodynamically stable and nontoxic appearing -IV fluids -Obtain urinalysis -Monitor   DVT prophylaxis: eliquis  Code Status: full  Family Communication: daughter at bedside  Disposition Plan: home  Consults called: none  Admission status: inpatient    Gwenyth Bender MD [facility]  If 7PM-7AM, please contact night-coverage [email or web address] Password [facility]  05/17/2017, 12:04 PM

## 2017-05-18 ENCOUNTER — Inpatient Hospital Stay (HOSPITAL_COMMUNITY): Payer: Managed Care, Other (non HMO)

## 2017-05-18 ENCOUNTER — Encounter (HOSPITAL_COMMUNITY): Payer: Self-pay | Admitting: *Deleted

## 2017-05-18 LAB — CBC
HCT: 35.4 % — ABNORMAL LOW (ref 36.0–46.0)
Hemoglobin: 12.3 g/dL (ref 12.0–15.0)
MCH: 29.1 pg (ref 26.0–34.0)
MCHC: 34.7 g/dL (ref 30.0–36.0)
MCV: 83.7 fL (ref 78.0–100.0)
Platelets: 284 10*3/uL (ref 150–400)
RBC: 4.23 MIL/uL (ref 3.87–5.11)
RDW: 14.7 % (ref 11.5–15.5)
WBC: 13.5 10*3/uL — ABNORMAL HIGH (ref 4.0–10.5)

## 2017-05-18 LAB — BASIC METABOLIC PANEL
Anion gap: 14 (ref 5–15)
BUN: 21 mg/dL — ABNORMAL HIGH (ref 6–20)
CO2: 15 mmol/L — ABNORMAL LOW (ref 22–32)
Calcium: 8.1 mg/dL — ABNORMAL LOW (ref 8.9–10.3)
Chloride: 109 mmol/L (ref 101–111)
Creatinine, Ser: 1.32 mg/dL — ABNORMAL HIGH (ref 0.44–1.00)
GFR calc Af Amer: 53 mL/min — ABNORMAL LOW (ref >60)
GFR calc non Af Amer: 45 mL/min — ABNORMAL LOW (ref >60)
Glucose, Bld: 191 mg/dL — ABNORMAL HIGH (ref 65–99)
Potassium: 4.3 mmol/L (ref 3.5–5.1)
Sodium: 138 mmol/L (ref 135–145)

## 2017-05-18 LAB — GLUCOSE, CAPILLARY
Glucose-Capillary: 124 mg/dL — ABNORMAL HIGH (ref 65–99)
Glucose-Capillary: 135 mg/dL — ABNORMAL HIGH (ref 65–99)
Glucose-Capillary: 145 mg/dL — ABNORMAL HIGH (ref 65–99)
Glucose-Capillary: 154 mg/dL — ABNORMAL HIGH (ref 65–99)
Glucose-Capillary: 160 mg/dL — ABNORMAL HIGH (ref 65–99)
Glucose-Capillary: 166 mg/dL — ABNORMAL HIGH (ref 65–99)

## 2017-05-18 MED ORDER — METOCLOPRAMIDE HCL 5 MG/ML IJ SOLN
10.0000 mg | Freq: Three times a day (TID) | INTRAMUSCULAR | Status: AC
  Administered 2017-05-18: 10 mg via INTRAVENOUS
  Filled 2017-05-18: qty 2

## 2017-05-18 MED ORDER — INSULIN ASPART 100 UNIT/ML ~~LOC~~ SOLN
0.0000 [IU] | Freq: Three times a day (TID) | SUBCUTANEOUS | Status: DC
  Administered 2017-05-18: 1 [IU] via SUBCUTANEOUS
  Administered 2017-05-19: 2 [IU] via SUBCUTANEOUS
  Administered 2017-05-20: 1 [IU] via SUBCUTANEOUS

## 2017-05-18 NOTE — Progress Notes (Signed)
   05/17/17 2331  What Happened  Was fall witnessed? Yes (assisted)  Who witnessed fall? Lieutenant DiegoSteven Howerton CNA and Hulan SaasNancy Preslee Regas RN  Patients activity before fall to/from bed, chair, or stretcher  Point of contact other (comment) (none)  Was patient injured? No  Follow Up  MD notified Elvin SoKatherine Scorr NP  Time MD notified [phone]  Family notified No- patient refusal  Additional tests No  Adult Fall Risk Assessment  Risk Factor Category (scoring not indicated) Fall has occurred during this admission (document High fall risk)  Age 52  Fall History: Fall within 6 months prior to admission 0  Elimination; Bowel and/or Urine Incontinence 0  Elimination; Bowel and/or Urine Urgency/Frequency 0  Medications: includes PCA/Opiates, Anti-convulsants, Anti-hypertensives, Diuretics, Hypnotics, Laxatives, Sedatives, and Psychotropics 3  Patient Care Equipment 1  Mobility-Assistance 2  Mobility-Gait 2  Mobility-Sensory Deficit 0  Altered awareness of immediate physical environment 0  Impulsiveness 0

## 2017-05-18 NOTE — Progress Notes (Signed)
At around 2300, patient went to the bathroom,had a bowel movement and became lightheaded and dizzy. NT assisted patient ont sit on bedside commode and nitified RN. Noted patient to be very pale and weak. VS were BP 82/59, HR 83, O2 100% on ra and R 16. Alert and oriented just slow to respond. Patient assisted to the floor by RN and NT while transferring her to wheel chair as she is very weak and unable to get up. Transferred patient with a hoyer lift to bed. VS: BP  108/65, HR 84, BS 118. Patient stated she feels much better. Tama GanderKatherine Schorr  NP with [facility] notified with new order. Will continue to monitor.

## 2017-05-18 NOTE — Progress Notes (Addendum)
PROGRESS NOTE    Dawn Braun   AVW:[id number]  DOB: Mar 21, 1966  DOA: 05/17/2017 PCP: Renford Dills, MD   Brief Narrative:  Dawn Braun 52 y.o. female with medical history significant for asthma, paroxysmal A. Fib anticoagulated eliquis, NIDDM presents as a direct admit from gastroenterology's office with persistent nausea and vomiting. She had a similar hospitalization about 4 months ago which occurred shortly after a cholecystectomy. CT at that time showed possible enteritis vs mild small bowel ileus as well as hemoperitoneum.   Subjective: No abdominal pain- currently is nauseated. She tells me that she has not been having any diarrhea but in the ER yesterday, she passed out when she was having a BM.  ROS: no complaints of nausea, vomiting, constipation diarrhea, cough, dyspnea or dysuria. No other complaints.   Assessment & Plan:   Principal Problem:   Intractable nausea and vomiting/   Leukocytosis - ultrasound of abdomen reveals prior cholecystectomy and no other acute changes - mild tenderness in epigastrium on exam - I have ordered a gastric emptying study for tomorrow- will need to hold Reglan and make NPO for this  Active Problems:  AKI and metabolic acidosis and syncope in the ER yesterday - she was orthostatic yesterday but may have also had vasovagal syncope - cont to hydrate with IVF and follow    Diabetes mellitus without complication (HCC) - A1c is 6.4- she takes Victoza at home- hold for now- cont low dose SSI     Paroxysmal atrial fibrillation  - on Flecainide, Diltiazem, Lopressor and Eliquis  DVT prophylaxis: Eliquis Code Status: Full code Family Communication:  Disposition Plan: home Consultants:   GI Procedures:    Antimicrobials:  Anti-infectives (From admission, onward)   None     Objective: Vitals:   05/17/17 2315 05/18/17 0135 05/18/17 0530 05/18/17 1330  BP: 108/65 (!) 169/84 (!) 141/82 125/70  Pulse: 84 79 79 67    Resp:  16 16 (!) 22  Temp:  98.5 F (36.9 C) 98.4 F (36.9 C) 98.6 F (37 C)  TempSrc:  Oral Oral Oral  SpO2:  100% 100% 100%  Weight:      Height:        Intake/Output Summary (Last 24 hours) at 05/18/2017 1523 Last data filed at 05/18/2017 1300 Gross per 24 hour  Intake 1340 ml  Output -  Net 1340 ml   Filed Weights   05/17/17 1027  Weight: 89.8 kg (198 lb)    Examination: General exam: Appears comfortable  HEENT: PERRLA, oral mucosa moist, no sclera icterus or thrush Respiratory system: Clear to auscultation. Respiratory effort normal. Cardiovascular system: S1 & S2 heard, RRR.  No murmurs  Gastrointestinal system: Abdomen soft, tender in the epigastrium, nondistended. Normal bowel sound. No organomegaly Central nervous system: Alert and oriented. No focal neurological deficits. Extremities: No cyanosis, clubbing or edema Skin: No rashes or ulcers Psychiatry:  Mood & affect appropriate.     Data Reviewed: I have personally reviewed following labs and imaging studies  CBC: Recent Labs  Lab 05/12/17 1208 05/16/17 1307 05/17/17 1153 05/18/17 0640  WBC 12.1* 16.2* 13.0* 13.5*  NEUTROABS  --  9.9*  --   --   HGB 14.1 14.4 13.2 12.3  HCT 40.8 42.9 40.1 35.4*  MCV 81.1 84.0 85.0 83.7  PLT 373 383 316 284   Basic Metabolic Panel: Recent Labs  Lab 05/12/17 1208 05/16/17 1158 05/17/17 1153 05/18/17 0640  NA 141 139 140 138  K 3.5 3.7  3.3* 4.3  CL 106 105 106 109  CO2 25 22 21* 15*  GLUCOSE 180* 204* 278* 191*  BUN 24* 29* 24* 21*  CREATININE 1.01* 1.22* 1.19* 1.32*  CALCIUM 9.5 9.1 8.9 8.1*   GFR: Estimated Creatinine Clearance: 51.9 mL/min (A) (by C-G formula based on SCr of 1.32 mg/dL (H)). Liver Function Tests: Recent Labs  Lab 05/12/17 1208 05/16/17 1307 05/17/17 1153  AST 31 47* 34  ALT 22 72* 62*  ALKPHOS 115 112 106  BILITOT 0.7 1.2 1.3*  PROT 8.9* 8.1 7.0  ALBUMIN 4.3 3.9 3.2*   Recent Labs  Lab 05/12/17 1208 05/17/17 1153   LIPASE 25 22   No results for input(s): AMMONIA in the last 168 hours. Coagulation Profile: No results for input(s): INR, PROTIME in the last 168 hours. Cardiac Enzymes: Recent Labs  Lab 05/12/17 1208  TROPONINI <0.03   BNP (last 3 results) No results for input(s): PROBNP in the last 8760 hours. HbA1C: Recent Labs    05/17/17 1153  HGBA1C 6.4*   CBG: Recent Labs  Lab 05/17/17 2002 05/17/17 2308 05/18/17 0609 05/18/17 0800 05/18/17 1214  GLUCAP 181* 118* 160* 154* 135*   Lipid Profile: No results for input(s): CHOL, HDL, LDLCALC, TRIG, CHOLHDL, LDLDIRECT in the last 72 hours. Thyroid Function Tests: Recent Labs    05/17/17 1153  TSH 1.502   Anemia Panel: No results for input(s): VITAMINB12, FOLATE, FERRITIN, TIBC, IRON, RETICCTPCT in the last 72 hours. Urine analysis:    Component Value Date/Time   COLORURINE YELLOW 05/17/2017 1054   APPEARANCEUR HAZY (A) 05/17/2017 1054   LABSPEC 1.026 05/17/2017 1054   PHURINE 6.0 05/17/2017 1054   GLUCOSEU >=500 (A) 05/17/2017 1054   HGBUR MODERATE (A) 05/17/2017 1054   BILIRUBINUR NEGATIVE 05/17/2017 1054   KETONESUR NEGATIVE 05/17/2017 1054   PROTEINUR >=300 (A) 05/17/2017 1054   NITRITE NEGATIVE 05/17/2017 1054   LEUKOCYTESUR NEGATIVE 05/17/2017 1054   Sepsis Labs: @LABRCNTIP (procalcitonin:4,lacticidven:4) )No results found for this or any previous visit (from the past 240 hour(s)).       Radiology Studies: [place] Abdomen Limited Ruq  Result Date: 05/18/2017 CLINICAL DATA:  Nausea and vomiting. EXAM: ULTRASOUND ABDOMEN LIMITED RIGHT UPPER QUADRANT COMPARISON:  CT abdomen pelvis dated December 31, 2016. FINDINGS: Gallbladder: Surgically absent. Common bile duct: Diameter: 6 mm, normal. Liver: No focal lesion identified. Within normal limits in parenchymal echogenicity. Portal vein is patent on color Doppler imaging with normal direction of blood flow towards the liver. IMPRESSION: 1. Prior cholecystectomy. Otherwise  normal right upper quadrant ultrasound. Electronically Signed   By: Obie DredgeWilliam T Derry M.D.   On: 05/18/2017 11:18      Scheduled Meds: . apixaban  5 mg Oral BID  . diltiazem  120 mg Oral Daily  . insulin aspart  0-9 Units Subcutaneous TID WC  . metoCLOPramide (REGLAN) injection  10 mg Intravenous Q8H  . metoprolol tartrate  25 mg Oral BID  . pantoprazole (PROTONIX) IV  40 mg Intravenous Q12H   Continuous Infusions: . sodium chloride 100 mL/hr at 05/18/17 1522     LOS: 1 day    Time spent in minutes: 35 min    Calvert CantorSaima Meara Wiechman, MD [facility] Pager: [email or web address] Password [email or web address] 05/18/2017, 3:23 PM

## 2017-05-18 NOTE — Progress Notes (Signed)
Updated Tama GanderKatherine Schorr about patient's status.

## 2017-05-18 NOTE — Progress Notes (Signed)
Subjective: She is feeling better.  No vomiting since her office visit yesterday morning.  She did have a syncopal episode that was witnessed when she was in the bathroom.  Objective: Vital signs in last 24 hours: Temp:  [98.4 F (36.9 C)-98.8 F (37.1 C)] 98.4 F (36.9 C) (02/12 0530) Pulse Rate:  [79-93] 79 (02/12 0530) Resp:  [16] 16 (02/12 0530) BP: (82-180)/(59-84) 141/82 (02/12 0530) SpO2:  [100 %] 100 % (02/12 0530) Last BM Date: 05/18/17  Intake/Output from previous day: 02/11 0701 - 02/12 0700 In: 740 [P.O.:240; I.V.:500] Out: -  Intake/Output this shift: No intake/output data recorded.  General appearance: alert and no distress GI: soft, non-tender; bowel sounds normal; no masses,  no organomegaly  Lab Results: Recent Labs    05/16/17 1307 05/17/17 1153 05/18/17 0640  WBC 16.2* 13.0* 13.5*  HGB 14.4 13.2 12.3  HCT 42.9 40.1 35.4*  PLT 383 316 284   BMET Recent Labs    05/16/17 1158 05/17/17 1153 05/18/17 0640  NA 139 140 138  K 3.7 3.3* 4.3  CL 105 106 109  CO2 22 21* 15*  GLUCOSE 204* 278* 191*  BUN 29* 24* 21*  CREATININE 1.22* 1.19* 1.32*  CALCIUM 9.1 8.9 8.1*   LFT Recent Labs    05/16/17 1307 05/17/17 1153  PROT 8.1 7.0  ALBUMIN 3.9 3.2*  AST 47* 34  ALT 72* 62*  ALKPHOS 112 106  BILITOT 1.2 1.3*  BILIDIR 0.3  --   IBILI 0.9  --    PT/INR No results for input(s): LABPROT, INR in the last 72 hours. Hepatitis Panel No results for input(s): HEPBSAG, HCVAB, HEPAIGM, HEPBIGM in the last 72 hours. C-Diff No results for input(s): CDIFFTOX in the last 72 hours. Fecal Lactopherrin No results for input(s): FECLLACTOFRN in the last 72 hours.  Studies/Results: Dg Chest Port 1 View  Result Date: 05/16/2017 CLINICAL DATA:  Acute shortness of breath. EXAM: PORTABLE CHEST 1 VIEW COMPARISON:  12/31/2016 and prior radiographs FINDINGS: The cardiomediastinal silhouette is unremarkable. Mild elevation of the right hemidiaphragm is again noted.  There is no evidence of focal airspace disease, pulmonary edema, suspicious pulmonary nodule/mass, pleural effusion, or pneumothorax. No acute bony abnormalities are identified. IMPRESSION: No active disease. Electronically Signed   By: Harmon PierJeffrey  Hu M.D.   On: 05/16/2017 14:37    Medications:  Scheduled: . apixaban  5 mg Oral BID  . diltiazem  120 mg Oral Daily  . insulin aspart  0-9 Units Subcutaneous Q4H  . metoCLOPramide (REGLAN) injection  10 mg Intravenous Q8H  . metoprolol tartrate  25 mg Oral BID  . ondansetron (ZOFRAN) IV  4 mg Intravenous Q6H  . pantoprazole (PROTONIX) IV  40 mg Intravenous Q12H   Continuous: . sodium chloride 75 mL/hr at 05/18/17 0600    Assessment/Plan: 1) Nausea/vomiting - ? Etiology. 2) Hypotension. 3) Syncope. 4) Elevated liver enzymes.   She appears better with IV hydration compared to her evaluation in the office yesterday, however, she still feels weak.  She had a hypotensive episode that required the assistance of Nursing at 2300 yesterday evening.  Her SBP was at 82.  Currently her blood pressure is normal to hypertensive, however, she reported the same issues at home with needing assistance and nearly passing out.  The source of her nausea and vomiting is not clear.  Certainly gastroparesis is a possibility and she is being emperically treated with Reglan.    Plan: 1) Advance diet as tolerated. 2) Continue with  IV hydration. 3) Check RUQ U/S to evaluate for the possibility of choledocholithiasis.  Her nausea and vomiting are reminiscent of her prior gallstones.  LOS: 1 day   Aerika Groll D 05/18/2017, 10:48 AM

## 2017-05-19 ENCOUNTER — Inpatient Hospital Stay (HOSPITAL_COMMUNITY): Payer: Managed Care, Other (non HMO)

## 2017-05-19 DIAGNOSIS — E119 Type 2 diabetes mellitus without complications: Secondary | ICD-10-CM

## 2017-05-19 DIAGNOSIS — G43A1 Cyclical vomiting, intractable: Secondary | ICD-10-CM

## 2017-05-19 DIAGNOSIS — I480 Paroxysmal atrial fibrillation: Secondary | ICD-10-CM

## 2017-05-19 DIAGNOSIS — D72829 Elevated white blood cell count, unspecified: Secondary | ICD-10-CM

## 2017-05-19 DIAGNOSIS — R112 Nausea with vomiting, unspecified: Secondary | ICD-10-CM

## 2017-05-19 LAB — CBC
HCT: 32.9 % — ABNORMAL LOW (ref 36.0–46.0)
Hemoglobin: 10.6 g/dL — ABNORMAL LOW (ref 12.0–15.0)
MCH: 27.6 pg (ref 26.0–34.0)
MCHC: 32.2 g/dL (ref 30.0–36.0)
MCV: 85.7 fL (ref 78.0–100.0)
Platelets: 258 10*3/uL (ref 150–400)
RBC: 3.84 MIL/uL — ABNORMAL LOW (ref 3.87–5.11)
RDW: 14.3 % (ref 11.5–15.5)
WBC: 10.4 10*3/uL (ref 4.0–10.5)

## 2017-05-19 LAB — BASIC METABOLIC PANEL
Anion gap: 10 (ref 5–15)
BUN: 16 mg/dL (ref 6–20)
CO2: 18 mmol/L — ABNORMAL LOW (ref 22–32)
Calcium: 7.7 mg/dL — ABNORMAL LOW (ref 8.9–10.3)
Chloride: 111 mmol/L (ref 101–111)
Creatinine, Ser: 1.1 mg/dL — ABNORMAL HIGH (ref 0.44–1.00)
GFR calc Af Amer: >60 mL/min (ref >60)
GFR calc non Af Amer: 57 mL/min — ABNORMAL LOW (ref >60)
Glucose, Bld: 124 mg/dL — ABNORMAL HIGH (ref 65–99)
Potassium: 3.6 mmol/L (ref 3.5–5.1)
Sodium: 139 mmol/L (ref 135–145)

## 2017-05-19 LAB — GLUCOSE, CAPILLARY
Glucose-Capillary: 115 mg/dL — ABNORMAL HIGH (ref 65–99)
Glucose-Capillary: 116 mg/dL — ABNORMAL HIGH (ref 65–99)
Glucose-Capillary: 157 mg/dL — ABNORMAL HIGH (ref 65–99)
Glucose-Capillary: 112 mg/dL — ABNORMAL HIGH (ref 65–99)

## 2017-05-19 MED ORDER — SODIUM CHLORIDE 0.9 % IV SOLN
INTRAVENOUS | Status: DC
  Administered 2017-05-19 (×2): via INTRAVENOUS

## 2017-05-19 MED ORDER — TECHNETIUM TC 99M SULFUR COLLOID
2.0000 | Freq: Once | INTRAVENOUS | Status: AC | PRN
Start: 2017-05-19 — End: 2017-05-19
  Administered 2017-05-19: 2 via INTRAVENOUS

## 2017-05-19 MED ORDER — SODIUM CHLORIDE 0.9 % IV SOLN
INTRAVENOUS | Status: DC
  Administered 2017-05-20: 04:00:00 via INTRAVENOUS

## 2017-05-19 NOTE — Discharge Instructions (Signed)

## 2017-05-19 NOTE — Progress Notes (Signed)
PROGRESS NOTE    DEVANY AJA   NWG:[id number]  DOB: [birth year]  DOA: 05/17/2017 PCP: Renford Dills, MD   Brief Narrative:  [name] 52 y.o. female with medical history significant for asthma, paroxysmal A. Fib anticoagulated eliquis, NIDDM presents as a direct admit from gastroenterology's office with persistent nausea and vomiting. She had a similar hospitalization about 4 months ago which occurred shortly after a cholecystectomy. CT at that time showed possible enteritis vs mild small bowel ileus as well as hemoperitoneum.   Subjective: No nausea vomiting. Patient tolerated egg earlier today. Patient is eager to find out the etiology of nausea and vomiting. ROS: no complaints of nausea, vomiting, constipation diarrhea, cough, dyspnea or dysuria. No other complaints.   Assessment & Plan:   Principal Problem:   Intractable nausea and vomiting/   Leukocytosis - ultrasound of abdomen reveals prior cholecystectomy and no other acute changes - mild tenderness in epigastrium on exam - I have ordered a gastric emptying study for tomorrow- will need to hold Reglan and make NPO for this -Gastric emptying study came back as normal.  However, the patient was already on Reglan prior to the procedure. -Leukocytosis has resolved significantly.  Active Problems:  AKI and metabolic acidosis and syncope in the ER yesterday - she was orthostatic yesterday but may have also had vasovagal syncope - cont to hydrate with IVF and follow -Acute kidney injury has resolved significantly. -No dizziness of syncope reported. -Etiology of the syncope/orthostatic symptoms could be secondary to volume depletion.  Cardiac causes cannot be entirely ruled out.  Patient has history of atrial fibrillation.    Diabetes mellitus without complication (HCC) - A1c is 6.4- she takes Victoza at home- hold for now- cont low dose SSI     Paroxysmal atrial fibrillation  - on Flecainide, Diltiazem,  Lopressor and Eliquis  DVT prophylaxis: Eliquis Code Status: Full code Family Communication:  Disposition Plan: home Consultants:   GI Procedures:    Antimicrobials:  Anti-infectives (From admission, onward)   None     Objective: Vitals:   05/18/17 1330 05/18/17 2030 05/19/17 0430 05/19/17 1326  BP: 125/70 112/81 116/65 129/83  Pulse: 67 72 72 71  Resp: (!) 22  20   Temp: 98.6 F (37 C) 98.5 F (36.9 C) 98.5 F (36.9 C) 98.1 F (36.7 C)  TempSrc: Oral Oral Oral Oral  SpO2: 100% 100% 100% 100%  Weight:      Height:        Intake/Output Summary (Last 24 hours) at 05/19/2017 1750 Last data filed at 05/19/2017 1700 Gross per 24 hour  Intake 1429.58 ml  Output -  Net 1429.58 ml   Filed Weights   05/17/17 1027  Weight: 198 lb (89.8 kg)    Examination: General exam: Appears comfortable  HEENT: PERRLA, oral mucosa moist, no sclera icterus or thrush Respiratory system: Clear to auscultation. Respiratory effort normal. Cardiovascular system: S1 & S2 heard, RRR.  No murmurs  Gastrointestinal system: Abdomen soft, tender in the epigastrium, nondistended. Normal bowel sound. No organomegaly Central nervous system: Alert and oriented. No focal neurological deficits. Extremities: No cyanosis, clubbing or edema   Data Reviewed: I have personally reviewed following labs and imaging studies  CBC: Recent Labs  Lab 05/16/17 1307 05/17/17 1153 05/18/17 0640 05/19/17 0549  WBC 16.2* 13.0* 13.5* 10.4  NEUTROABS 9.9*  --   --   --   HGB 14.4 13.2 12.3 10.6*  HCT 42.9 40.1 35.4* 32.9*  MCV 84.0 85.0  83.7 85.7  PLT 383 316 284 258   Basic Metabolic Panel: Recent Labs  Lab 05/16/17 1158 05/17/17 1153 05/18/17 0640 05/19/17 0549  NA 139 140 138 139  K 3.7 3.3* 4.3 3.6  CL 105 106 109 111  CO2 22 21* 15* 18*  GLUCOSE 204* 278* 191* 124*  BUN 29* 24* 21* 16  CREATININE 1.22* 1.19* 1.32* 1.10*  CALCIUM 9.1 8.9 8.1* 7.7*   GFR: Estimated Creatinine Clearance:  62.3 mL/min (A) (by C-G formula based on SCr of 1.1 mg/dL (H)). Liver Function Tests: Recent Labs  Lab 05/16/17 1307 05/17/17 1153  AST 47* 34  ALT 72* 62*  ALKPHOS 112 106  BILITOT 1.2 1.3*  PROT 8.1 7.0  ALBUMIN 3.9 3.2*   Recent Labs  Lab 05/17/17 1153  LIPASE 22   No results for input(s): AMMONIA in the last 168 hours. Coagulation Profile: No results for input(s): INR, PROTIME in the last 168 hours. Cardiac Enzymes: No results for input(s): CKTOTAL, CKMB, CKMBINDEX, TROPONINI in the last 168 hours. BNP (last 3 results) No results for input(s): PROBNP in the last 8760 hours. HbA1C: Recent Labs    05/17/17 1153  HGBA1C 6.4*   CBG: Recent Labs  Lab 05/18/17 2029 05/18/17 2338 05/19/17 0541 05/19/17 1151 05/19/17 1629  GLUCAP 124* 166* 115* 116* 157*   Lipid Profile: No results for input(s): CHOL, HDL, LDLCALC, TRIG, CHOLHDL, LDLDIRECT in the last 72 hours. Thyroid Function Tests: Recent Labs    05/17/17 1153  TSH 1.502   Anemia Panel: No results for input(s): VITAMINB12, FOLATE, FERRITIN, TIBC, IRON, RETICCTPCT in the last 72 hours. Urine analysis:    Component Value Date/Time   COLORURINE YELLOW 05/17/2017 1054   APPEARANCEUR HAZY (A) 05/17/2017 1054   LABSPEC 1.026 05/17/2017 1054   PHURINE 6.0 05/17/2017 1054   GLUCOSEU >=500 (A) 05/17/2017 1054   HGBUR MODERATE (A) 05/17/2017 1054   BILIRUBINUR NEGATIVE 05/17/2017 1054   KETONESUR NEGATIVE 05/17/2017 1054   PROTEINUR >=300 (A) 05/17/2017 1054   NITRITE NEGATIVE 05/17/2017 1054   LEUKOCYTESUR NEGATIVE 05/17/2017 1054   Sepsis Labs: @LABRCNTIP (procalcitonin:4,lacticidven:4) )No results found for this or any previous visit (from the past 240 hour(s)).       Radiology Studies: Nm Gastric Emptying  Result Date: 05/19/2017 CLINICAL DATA:  Nausea and vomiting, diabetes mellitus, prior cholecystectomy EXAM: NUCLEAR MEDICINE GASTRIC EMPTYING SCAN TECHNIQUE: After oral ingestion of radiolabeled  meal, sequential abdominal images were obtained for 4 hours. Percentage of activity emptying the stomach was calculated at 1 hour, 2 hour, and 3 hours. Imaging at 4 hours was not required. RADIOPHARMACEUTICALS:  2 mCi Tc-3615m sulfur colloid in standardized meal COMPARISON:  None FINDINGS: Expected location of the stomach in the left upper quadrant. Ingested meal empties the stomach gradually over the course of the study. 24% emptied at 1 hr ( normal >= 10%) 74% emptied at 2 hr ( normal >= 40%) 93% emptied at 3 hr ( normal >= 70%) N/A emptied at 4 hr ( normal >= 90%) IMPRESSION: Normal the gastric emptying study. Electronically Signed   By: Ulyses SouthwardMark  Boles M.D.   On: 05/19/2017 12:21   [place] Abdomen Limited Ruq  Result Date: 05/18/2017 CLINICAL DATA:  Nausea and vomiting. EXAM: ULTRASOUND ABDOMEN LIMITED RIGHT UPPER QUADRANT COMPARISON:  CT abdomen pelvis dated December 31, 2016. FINDINGS: Gallbladder: Surgically absent. Common bile duct: Diameter: 6 mm, normal. Liver: No focal lesion identified. Within normal limits in parenchymal echogenicity. Portal vein is patent on color Doppler imaging  with normal direction of blood flow towards the liver. IMPRESSION: 1. Prior cholecystectomy. Otherwise normal right upper quadrant ultrasound. Electronically Signed   By: Obie Dredge M.D.   On: 05/18/2017 11:18      Scheduled Meds: . apixaban  5 mg Oral BID  . diltiazem  120 mg Oral Daily  . insulin aspart  0-9 Units Subcutaneous TID WC  . metoprolol tartrate  25 mg Oral BID  . pantoprazole (PROTONIX) IV  40 mg Intravenous Q12H   Continuous Infusions: . sodium chloride       LOS: 2 days    Time spent in minutes: 35 min    Barnetta Chapel, MD [facility] Pager: [phone]. [email or web address] Password [email or web address] 05/19/2017, 5:50 PM

## 2017-05-19 NOTE — Progress Notes (Signed)
Subjective: Eating the scrambled egg for the GES was the "best meal".  Objective: Vital signs in last 24 hours: Temp:  [98.5 F (36.9 C)-98.6 F (37 C)] 98.5 F (36.9 C) (02/13 0430) Pulse Rate:  [67-72] 72 (02/13 0430) Resp:  [20-22] 20 (02/13 0430) BP: (112-125)/(65-81) 116/65 (02/13 0430) SpO2:  [100 %] 100 % (02/13 0430) Last BM Date: 05/18/17  Intake/Output from previous day: 02/12 0701 - 02/13 0700 In: 960 [P.O.:960] Out: -  Intake/Output this shift: No intake/output data recorded.  General appearance: alert and no distress GI: soft, non-tender; bowel sounds normal; no masses,  no organomegaly  Lab Results: Recent Labs    05/17/17 1153 05/18/17 0640 05/19/17 0549  WBC 13.0* 13.5* 10.4  HGB 13.2 12.3 10.6*  HCT 40.1 35.4* 32.9*  PLT 316 284 258   BMET Recent Labs    05/17/17 1153 05/18/17 0640 05/19/17 0549  NA 140 138 139  K 3.3* 4.3 3.6  CL 106 109 111  CO2 21* 15* 18*  GLUCOSE 278* 191* 124*  BUN 24* 21* 16  CREATININE 1.19* 1.32* 1.10*  CALCIUM 8.9 8.1* 7.7*   LFT Recent Labs    05/16/17 1307 05/17/17 1153  PROT 8.1 7.0  ALBUMIN 3.9 3.2*  AST 47* 34  ALT 72* 62*  ALKPHOS 112 106  BILITOT 1.2 1.3*  BILIDIR 0.3  --   IBILI 0.9  --    PT/INR No results for input(s): LABPROT, INR in the last 72 hours. Hepatitis Panel No results for input(s): HEPBSAG, HCVAB, HEPAIGM, HEPBIGM in the last 72 hours. C-Diff No results for input(s): CDIFFTOX in the last 72 hours. Fecal Lactopherrin No results for input(s): FECLLACTOFRN in the last 72 hours.  Studies/Results: [place] Abdomen Limited Ruq  Result Date: 05/18/2017 CLINICAL DATA:  Nausea and vomiting. EXAM: ULTRASOUND ABDOMEN LIMITED RIGHT UPPER QUADRANT COMPARISON:  CT abdomen pelvis dated December 31, 2016. FINDINGS: Gallbladder: Surgically absent. Common bile duct: Diameter: 6 mm, normal. Liver: No focal lesion identified. Within normal limits in parenchymal echogenicity. Portal vein is patent on  color Doppler imaging with normal direction of blood flow towards the liver. IMPRESSION: 1. Prior cholecystectomy. Otherwise normal right upper quadrant ultrasound. Electronically Signed   By: Obie DredgeWilliam T Derry M.D.   On: 05/18/2017 11:18    Medications:  Scheduled: . apixaban  5 mg Oral BID  . diltiazem  120 mg Oral Daily  . insulin aspart  0-9 Units Subcutaneous TID WC  . metoprolol tartrate  25 mg Oral BID  . pantoprazole (PROTONIX) IV  40 mg Intravenous Q12H   Continuous: . sodium chloride 125 mL/hr at 05/19/17 0729    Assessment/Plan: 1) Nausea/Vomiting - Resolved. 2) Dehydration - Resolved.   I saw the patient as she was just about to undergo the GES.  She did not have any problems with consuming the scrambled egg for her GES.  Gastroparesis is a consideration, however, these results may be difficult to discern as she was just on Reglan.  Clinically she is much improved and feeling great, per her report.  Her dehydration has resolved with IV hydration as her creatinine is normalizing.  ? Drug effect from Victoza.  In the office she reported that the medication was a direct cause of her nausea and vomiting as she experimented with it at home.  However, even with a longer cessation of the medication she continued to have nausea and vomiting.  Plan: 1) Await GES. 2) Trial of a regular diet.  She desires to  eat a normal meal.  LOS: 2 days   Pavle Wiler D 05/19/2017, 7:48 AM

## 2017-05-20 DIAGNOSIS — E869 Volume depletion, unspecified: Secondary | ICD-10-CM

## 2017-05-20 DIAGNOSIS — I951 Orthostatic hypotension: Secondary | ICD-10-CM

## 2017-05-20 DIAGNOSIS — R55 Syncope and collapse: Secondary | ICD-10-CM

## 2017-05-20 DIAGNOSIS — N179 Acute kidney failure, unspecified: Principal | ICD-10-CM

## 2017-05-20 LAB — GLUCOSE, CAPILLARY
Glucose-Capillary: 116 mg/dL — ABNORMAL HIGH (ref 65–99)
Glucose-Capillary: 137 mg/dL — ABNORMAL HIGH (ref 65–99)

## 2017-05-20 LAB — RENAL FUNCTION PANEL
Albumin: 2.4 g/dL — ABNORMAL LOW (ref 3.5–5.0)
Anion gap: 10 (ref 5–15)
BUN: 11 mg/dL (ref 6–20)
CO2: 19 mmol/L — ABNORMAL LOW (ref 22–32)
Calcium: 8 mg/dL — ABNORMAL LOW (ref 8.9–10.3)
Chloride: 111 mmol/L (ref 101–111)
Creatinine, Ser: 0.95 mg/dL (ref 0.44–1.00)
GFR calc Af Amer: >60 mL/min (ref >60)
GFR calc non Af Amer: >60 mL/min (ref >60)
Glucose, Bld: 126 mg/dL — ABNORMAL HIGH (ref 65–99)
Phosphorus: 2.9 mg/dL (ref 2.5–4.6)
Potassium: 3.8 mmol/L (ref 3.5–5.1)
Sodium: 140 mmol/L (ref 135–145)

## 2017-05-20 NOTE — Progress Notes (Signed)
Subjective: She feels great.  No complaints at this time.  Objective: Vital signs in last 24 hours: Temp:  [97.8 F (36.6 C)-98.4 F (36.9 C)] 98.4 F (36.9 C) (02/14 0800) Pulse Rate:  [68-85] 68 (02/14 0800) Resp:  [20] 20 (02/14 0800) BP: (125-153)/(60-83) 142/75 (02/14 0800) SpO2:  [100 %] 100 % (02/14 0800) Last BM Date: 05/18/17  Intake/Output from previous day: 02/13 0701 - 02/14 0700 In: 2329.6 [P.O.:540; I.V.:1789.6] Out: -  Intake/Output this shift: No intake/output data recorded.  General appearance: alert and no distress GI: soft, non-tender; bowel sounds normal; no masses,  no organomegaly  Lab Results: Recent Labs    05/17/17 1153 05/18/17 0640 05/19/17 0549  WBC 13.0* 13.5* 10.4  HGB 13.2 12.3 10.6*  HCT 40.1 35.4* 32.9*  PLT 316 284 258   BMET Recent Labs    05/18/17 0640 05/19/17 0549 05/20/17 0553  NA 138 139 140  K 4.3 3.6 3.8  CL 109 111 111  CO2 15* 18* 19*  GLUCOSE 191* 124* 126*  BUN 21* 16 11  CREATININE 1.32* 1.10* 0.95  CALCIUM 8.1* 7.7* 8.0*   LFT Recent Labs    05/17/17 1153 05/20/17 0553  PROT 7.0  --   ALBUMIN 3.2* 2.4*  AST 34  --   ALT 62*  --   ALKPHOS 106  --   BILITOT 1.3*  --    PT/INR No results for input(s): LABPROT, INR in the last 72 hours. Hepatitis Panel No results for input(s): HEPBSAG, HCVAB, HEPAIGM, HEPBIGM in the last 72 hours. C-Diff No results for input(s): CDIFFTOX in the last 72 hours. Fecal Lactopherrin No results for input(s): FECLLACTOFRN in the last 72 hours.  Studies/Results: Nm Gastric Emptying  Result Date: 05/19/2017 CLINICAL DATA:  Nausea and vomiting, diabetes mellitus, prior cholecystectomy EXAM: NUCLEAR MEDICINE GASTRIC EMPTYING SCAN TECHNIQUE: After oral ingestion of radiolabeled meal, sequential abdominal images were obtained for 4 hours. Percentage of activity emptying the stomach was calculated at 1 hour, 2 hour, and 3 hours. Imaging at 4 hours was not required.  RADIOPHARMACEUTICALS:  2 mCi Tc-2560m sulfur colloid in standardized meal COMPARISON:  None FINDINGS: Expected location of the stomach in the left upper quadrant. Ingested meal empties the stomach gradually over the course of the study. 24% emptied at 1 hr ( normal >= 10%) 74% emptied at 2 hr ( normal >= 40%) 93% emptied at 3 hr ( normal >= 70%) N/A emptied at 4 hr ( normal >= 90%) IMPRESSION: Normal the gastric emptying study. Electronically Signed   By: Ulyses SouthwardMark  Boles M.D.   On: 05/19/2017 12:21   [place] Abdomen Limited Ruq  Result Date: 05/18/2017 CLINICAL DATA:  Nausea and vomiting. EXAM: ULTRASOUND ABDOMEN LIMITED RIGHT UPPER QUADRANT COMPARISON:  CT abdomen pelvis dated December 31, 2016. FINDINGS: Gallbladder: Surgically absent. Common bile duct: Diameter: 6 mm, normal. Liver: No focal lesion identified. Within normal limits in parenchymal echogenicity. Portal vein is patent on color Doppler imaging with normal direction of blood flow towards the liver. IMPRESSION: 1. Prior cholecystectomy. Otherwise normal right upper quadrant ultrasound. Electronically Signed   By: Obie DredgeWilliam T Derry M.D.   On: 05/18/2017 11:18    Medications:  Scheduled: . apixaban  5 mg Oral BID  . diltiazem  120 mg Oral Daily  . insulin aspart  0-9 Units Subcutaneous TID WC  . metoprolol tartrate  25 mg Oral BID  . pantoprazole (PROTONIX) IV  40 mg Intravenous Q12H   Continuous: . sodium chloride 75 mL/hr  at 05/20/17 0600    Assessment/Plan: 1) Nausea and vomiting. 2) Dehydration.   All of her symptoms have resolved.  The etiology of her symptoms are unknown, but I have a suspicion, given her history to me that Victoza was the culprit.  She is planning on calling her PCP and obtaining a new diabetic medication.  All of her medical issues have resolved.  Plan: 1) Okay to D/C home. 2) Follow up in the office with Dr. Loreta Ave in 4 weeks.  LOS: 3 days   Dawn Braun D 05/20/2017, 8:38 AM

## 2017-05-20 NOTE — Discharge Summary (Signed)
Physician Discharge Summary  Patient ID: DHRUTI GHUMAN MRN: [id number] DOB/AGE: January 03, 1966 52 y.o.  Admit date: 05/17/2017 Discharge date: 05/20/2017  Admission Diagnoses:  Discharge Diagnoses:  Principal Problem:   Intractable nausea and vomiting Active Problems:   Diabetes mellitus without complication (HCC)   Paroxysmal atrial fibrillation (HCC)   Leukocytosis   Discharged Condition: stable.  Brief Narrative:  Patient is a 52 year old female with past medical history significant for asthma, paroxysmal A. Fib anticoagulatedeliquis, NIDDM.  Patient was admitted directly from the gastroenterologist office with persistent nausea and vomiting.  Apparently, patient had similar symptoms and was hospitalized 4 months ago.  The episode of nausea and vomiting that the patient had about 4 months ago occurred shortly after a cholecystectomy. CT at that time showed possible enteritis vs mild small bowel ileus as well as hemoperitoneum  Hospital Course:   Intractable nausea and vomiting/Leukocytosis - this was worked up extensively.  Ultrasound of abdomen revealed prior cholecystectomy and no other acute changes.  GI team was consulted to help direct patient's care.  Patient proceeded with gastric emptying study that came back normal.  The patient's symptoms were managed supportively.  The leukocytosis is likely reactive, and has resolved.  AKI and metabolic acidosis and syncope in the ER yesterday - this is likely multifactorial.  AK I is likely pre-renal.  The patient was volume depleted.  Will correction of volume depletion, the symptoms resolved.  Acute kidney injury has resolved.      Diabetes mellitus without complication (HCC) - A1c was 6.4-continue to manage expectantly.       Paroxysmal atrial fibrillation  -Prior to admission, the patient was on Flecainide, Diltiazem, Lopressor and Eliquis.  Please discusse need to continue flecainide with your PCP and cardiologist.  Consults:  GI  Significant Diagnostic Studies: Gastric emptying study came back normal.  However, the patient was on Reglan prior to the test.  Nausea and vomiting continue, consider a repeat in test.  Patient is a diabetic, therefore, could have diabetic gastroparesis as a possible complication.  Discharge Exam: Blood pressure (!) 142/75, pulse 68, temperature 98.4 F (36.9 C), temperature source Oral, resp. rate 20, height 5\' 2"  (1.575 m), weight 198 lb (89.8 kg), SpO2 100 %.   Disposition: 01-Home or Self Care  Discharge Instructions    Call MD for:   Complete by:  As directed    Call MD if symptoms worsen   Diet - low sodium heart healthy   Complete by:  As directed    Diet Carb Modified   Complete by:  As directed    Discharge instructions   Complete by:  As directed    Discussed need to continue flecainide with PCP and cardiology.   Increase activity slowly   Complete by:  As directed      Allergies as of 05/20/2017   No Known Allergies     Medication List    STOP taking these medications   flecainide 100 MG tablet Commonly known as:  TAMBOCOR     TAKE these medications   apixaban 5 MG Tabs tablet Commonly known as:  ELIQUIS Take 1 tablet (5 mg total) by mouth 2 (two) times daily.   diltiazem 120 MG 24 hr capsule Commonly known as:  CARTIA XT Take 1 capsule (120 mg total) by mouth daily.   metoprolol tartrate 25 MG tablet Commonly known as:  LOPRESSOR Take 1 tablet (25 mg total) by mouth 2 (two) times daily.   VICTOZA 18 MG/3ML Sopn  Generic drug:  liraglutide Inject 1.8 mg into the skin daily.        SignedBarnetta Chapel: Shuna Tabor I Whitni Pasquini 05/20/2017, 11:52 AM

## 2017-07-27 ENCOUNTER — Ambulatory Visit (HOSPITAL_COMMUNITY)
Admission: RE | Admit: 2017-07-27 | Discharge: 2017-07-27 | Disposition: A | Discharge status: Home / Self Care | Payer: Managed Care, Other (non HMO) | Source: Ambulatory Visit | Attending: Nurse Practitioner | Admitting: Nurse Practitioner

## 2017-07-27 ENCOUNTER — Encounter (HOSPITAL_COMMUNITY): Payer: Self-pay | Admitting: Nurse Practitioner

## 2017-07-27 VITALS — BP 132/78 | HR 60 | Ht 62.0 in | Wt 213.0 lb

## 2017-07-27 DIAGNOSIS — Z841 Family history of disorders of kidney and ureter: Secondary | ICD-10-CM | POA: Insufficient documentation

## 2017-07-27 DIAGNOSIS — Z820 Family history of epilepsy and other diseases of the nervous system: Secondary | ICD-10-CM | POA: Insufficient documentation

## 2017-07-27 DIAGNOSIS — Z833 Family history of diabetes mellitus: Secondary | ICD-10-CM | POA: Insufficient documentation

## 2017-07-27 DIAGNOSIS — Z8249 Family history of ischemic heart disease and other diseases of the circulatory system: Secondary | ICD-10-CM | POA: Insufficient documentation

## 2017-07-27 DIAGNOSIS — I10 Essential (primary) hypertension: Secondary | ICD-10-CM | POA: Insufficient documentation

## 2017-07-27 DIAGNOSIS — Z9049 Acquired absence of other specified parts of digestive tract: Secondary | ICD-10-CM | POA: Diagnosis not present

## 2017-07-27 DIAGNOSIS — Z79899 Other long term (current) drug therapy: Secondary | ICD-10-CM | POA: Diagnosis not present

## 2017-07-27 DIAGNOSIS — I480 Paroxysmal atrial fibrillation: Secondary | ICD-10-CM | POA: Diagnosis not present

## 2017-07-27 DIAGNOSIS — E119 Type 2 diabetes mellitus without complications: Secondary | ICD-10-CM | POA: Diagnosis not present

## 2017-07-27 DIAGNOSIS — Z7901 Long term (current) use of anticoagulants: Secondary | ICD-10-CM | POA: Diagnosis not present

## 2017-07-27 DIAGNOSIS — I4891 Unspecified atrial fibrillation: Secondary | ICD-10-CM | POA: Diagnosis present

## 2017-07-27 MED ORDER — FLECAINIDE ACETATE 100 MG PO TABS: 100.0000 mg | ORAL_TABLET | Freq: Two times a day (BID) | ORAL | 9 refills | Status: DC

## 2017-07-27 NOTE — Progress Notes (Signed)
Primary Care Physician: Renford Dills, MD Referring Physician: ER f/u   Dawn Braun is a 52 y.o. female with a h/o afib on flecainide with  gallbladder surgery 12/2016. Her afib was quiet during the surgery. She has recovered nicely. Continues on flecainide and eliquis 5 mg bid.  F/u in afib clinic, 4/23. She had hospitalization x 2 in February for persistent vomiting. She was checked for gut motility issues and for retained stones, no issues found to describe the vomiting. Pt is contributing to [name] as when she takes after a few hours, she will vomit. Because of this, she is not taking [name]. She is establishing with a new PCP in May and will further discuss. She is trying to be super careful with her diet. For unclear reasons, after last hospitalization, she was told to stop flecainide on d/c instructions. However, she has continues to take.She has not had any afib during the GB surgery in the fall or recently with the vomiting.  Today, she denies symptoms of palpitations, chest pain, shortness of breath, orthopnea, PND, lower extremity edema, dizziness, presyncope, syncope, or neurologic sequela. The patient is tolerating medications without difficulties and is otherwise without complaint today.   Past Medical History:  Diagnosis Date  . Asthma   . Complication of anesthesia    "takes me awhile to wakeup" (05/17/2017)  . Hypertension   . Intractable nausea and vomiting   . Paroxysmal atrial fibrillation (HCC)   . Type II diabetes mellitus (HCC)    Past Surgical History:  Procedure Laterality Date  . CESAREAN SECTION  2003  . CHOLECYSTECTOMY N/A 12/23/2016   Procedure: LAPAROSCOPIC CHOLECYSTECTOMY;  Surgeon: Sheliah Hatch De Blanch, MD;  Location: [facility];  Service: General;  Laterality: N/A;  . EYE SURGERY Right ~ 2016   "bleeding issues; lasered; cauterized leaks"  . KNEE ARTHROSCOPY Right X 2  . SPLIT NIGHT STUDY  08/06/2015  . TUBAL LIGATION  2003    Current  Outpatient Medications  Medication Sig Dispense Refill  . apixaban (ELIQUIS) 5 MG TABS tablet Take 1 tablet (5 mg total) by mouth 2 (two) times daily. 60 tablet 11  . diltiazem (CARTIA XT) 120 MG 24 hr capsule Take 1 capsule (120 mg total) by mouth daily. 30 capsule 6  . flecainide (TAMBOCOR) 100 MG tablet Take 1 tablet (100 mg total) by mouth 2 (two) times daily. 60 tablet 9  . metoprolol tartrate (LOPRESSOR) 25 MG tablet Take 1 tablet (25 mg total) by mouth 2 (two) times daily. 60 tablet 6  . VICTOZA 18 MG/3ML SOPN Inject 1.8 mg into the skin daily.  3   No current facility-administered medications for this encounter.     No Known Allergies  Social History   Socioeconomic History  . Marital status: Single    Spouse name: Not on file  . Number of children: 2  . Years of education: Not on file  . Highest education level: Not on file  Occupational History  . Occupation: Radio broadcast assistant  Social Needs  . Financial resource strain: Not on file  . Food insecurity:    Worry: Not on file    Inability: Not on file  . Transportation needs:    Medical: Not on file    Non-medical: Not on file  Tobacco Use  . Smoking status: Never Smoker  . Smokeless tobacco: Never Used  Substance and Sexual Activity  . Alcohol use: Yes    Comment: 05/17/2017 "glass of wine q other weekend"  .  Drug use: No  . Sexual activity: Yes  Lifestyle  . Physical activity:    Days per week: Not on file    Minutes per session: Not on file  . Stress: Not on file  Relationships  . Social connections:    Talks on phone: Not on file    Gets together: Not on file    Attends religious service: Not on file    Active member of club or organization: Not on file    Attends meetings of clubs or organizations: Not on file    Relationship status: Not on file  . Intimate partner violence:    Fear of current or ex partner: Not on file    Emotionally abused: Not on file    Physically abused: Not on file     Forced sexual activity: Not on file  Other Topics Concern  . Not on file  Social History Narrative  . Not on file    Family History  Problem Relation Age of Onset  . Epilepsy Father   . Diabetes Mother   . Kidney disease Mother   . Hypertension Mother   . Kidney disease Brother   . Diabetic kidney disease Brother   . Diabetes Brother     ROS- All systems are reviewed and negative except as per the HPI above  Physical Exam: Vitals:   07/27/17 1015  BP: 132/78  Pulse: 60  Weight: 213 lb (96.6 kg)  Height: 5\' 2"  (1.575 m)   Wt Readings from Last 3 Encounters:  07/27/17 213 lb (96.6 kg)  05/17/17 198 lb (89.8 kg)  05/16/17 195 lb (88.5 kg)    Labs: Lab Results  Component Value Date   NA 140 05/20/2017   K 3.8 05/20/2017   CL 111 05/20/2017   CO2 19 (L) 05/20/2017   GLUCOSE 126 (H) 05/20/2017   BUN 11 05/20/2017   CREATININE 0.95 05/20/2017   CALCIUM 8.0 (L) 05/20/2017   PHOS 2.9 05/20/2017   MG 1.6 (L) 01/02/2017   Lab Results  Component Value Date   INR 1.10 12/31/2016   No results found for: CHOL, HDL, LDLCALC, TRIG   GEN- The patient is well appearing, alert and oriented x 3 today.   Head- normocephalic, atraumatic Eyes-  Sclera clear, conjunctiva pink Ears- hearing intact Oropharynx- clear Neck- supple, no JVP Lymph- no cervical lymphadenopathy Lungs- Clear to ausculation bilaterally, normal work of breathing Heart- Regular rate and rhythm, no murmurs, rubs or gallops, PMI not laterally displaced GI- soft, NT, ND, + BS Extremities- no clubbing, cyanosis, or edema MS- no significant deformity or atrophy Skin- no rash or lesion Psych- euthymic mood, full affect Neuro- strength and sensation are intact  EKG- NSR at 60 bpm, pr int 184 ms, qrs int 84 ms, qtc 416 ms Epic records reviewed    Assessment and Plan: 1. Paroxysmal afib Quiet  on flecainide 100 mg bid Continue metoprolol 25 mg bid/ dilt 120 mg qd Continue eliquis 5 mg bid for a  chadsvasc score of 2(female, DM)  2. Intractable vomiting Resolved but still will occur with Victozia, pt holding for now,will discuss when establishes with new PCP in [place]  F/u in 9 months  Lupita LeashDonna C. Matthew Folksarroll, ANP-C Afib Clinic [facility] [address] [place], [place] [address] [phone]

## 2017-12-09 ENCOUNTER — Other Ambulatory Visit (HOSPITAL_COMMUNITY): Payer: Self-pay | Admitting: Nurse Practitioner

## 2017-12-16 ENCOUNTER — Ambulatory Visit (HOSPITAL_COMMUNITY): Payer: Managed Care, Other (non HMO) | Attending: Cardiology

## 2017-12-16 ENCOUNTER — Other Ambulatory Visit: Payer: Self-pay

## 2017-12-16 ENCOUNTER — Other Ambulatory Visit (HOSPITAL_COMMUNITY): Payer: Self-pay | Admitting: Internal Medicine

## 2017-12-16 DIAGNOSIS — I10 Essential (primary) hypertension: Secondary | ICD-10-CM | POA: Insufficient documentation

## 2017-12-16 DIAGNOSIS — E119 Type 2 diabetes mellitus without complications: Secondary | ICD-10-CM | POA: Diagnosis not present

## 2017-12-16 DIAGNOSIS — J45909 Unspecified asthma, uncomplicated: Secondary | ICD-10-CM | POA: Diagnosis not present

## 2017-12-16 DIAGNOSIS — I480 Paroxysmal atrial fibrillation: Secondary | ICD-10-CM | POA: Insufficient documentation

## 2017-12-16 DIAGNOSIS — I340 Nonrheumatic mitral (valve) insufficiency: Secondary | ICD-10-CM | POA: Diagnosis not present

## 2018-01-31 ENCOUNTER — Other Ambulatory Visit (HOSPITAL_COMMUNITY): Payer: Self-pay | Admitting: Nurse Practitioner

## 2018-07-09 IMAGING — US US ABDOMEN COMPLETE
1 series · 14 of 25 positions shown · non-contrast
Comparison: 06/17/2016

CLINICAL DATA: Nausea, vomiting for 3 weeks

EXAM:
ABDOMEN ULTRASOUND COMPLETE

[Series 1: us abdomen complete · 0.22mm/px · 14 of 89 slices shown]
[im 1/89]
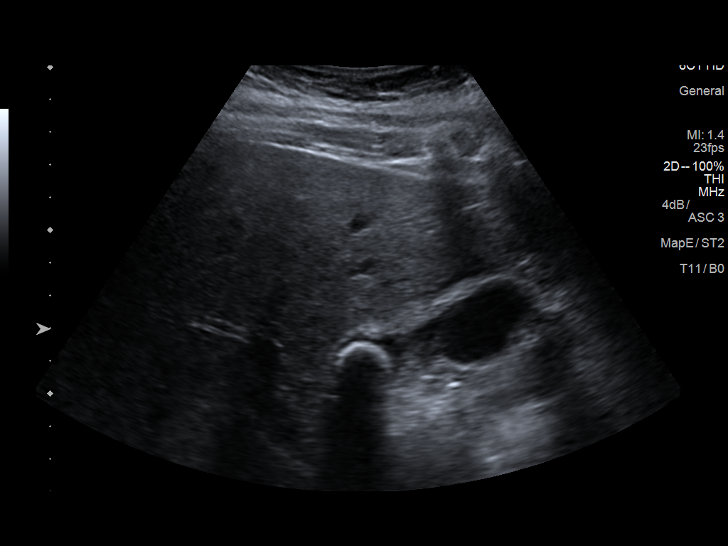
[im 8/89]
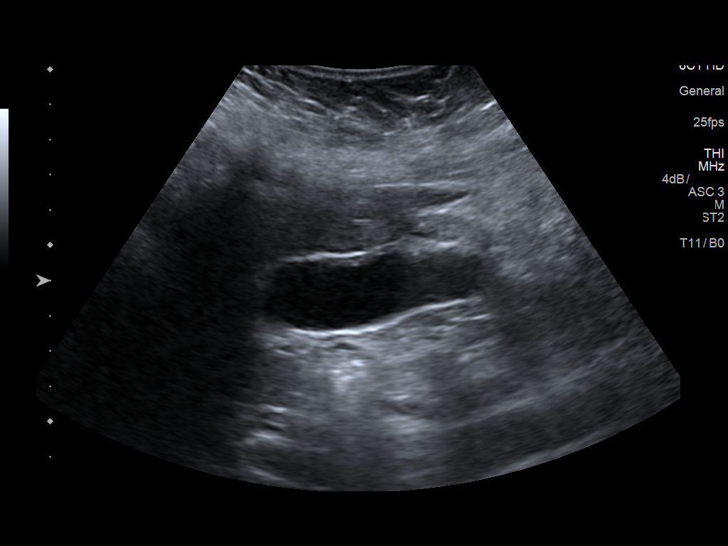
[im 15/89]
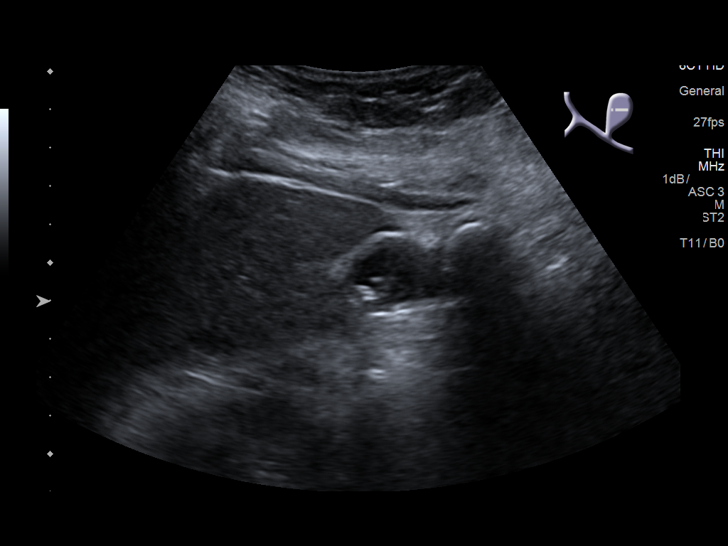
[im 23/89]
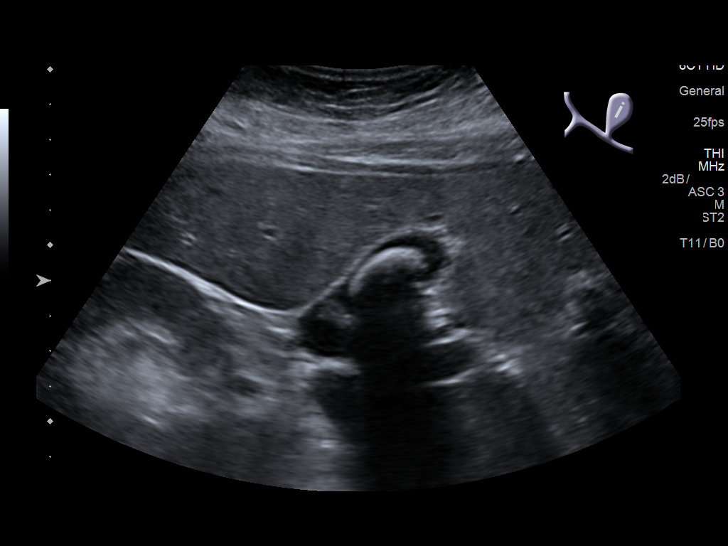
[im 30/89]
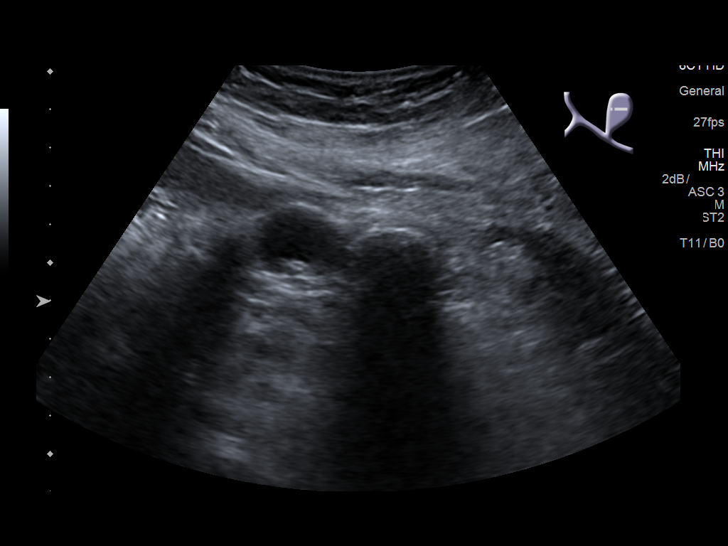
[im 34/89]
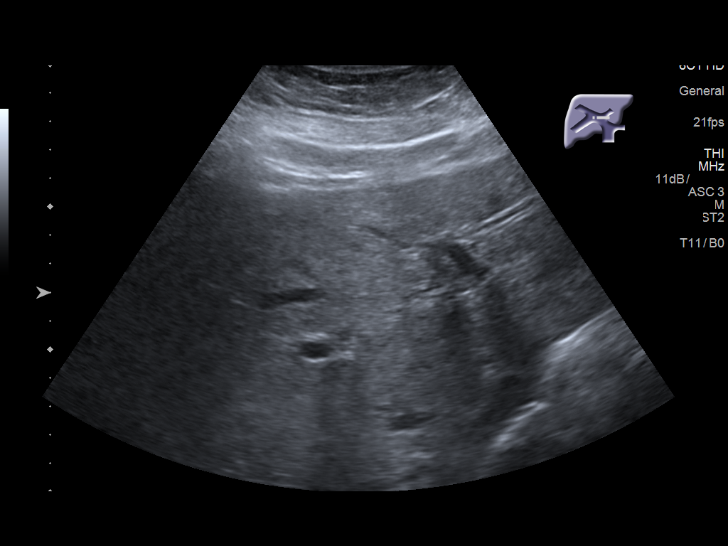
[im 41/89]
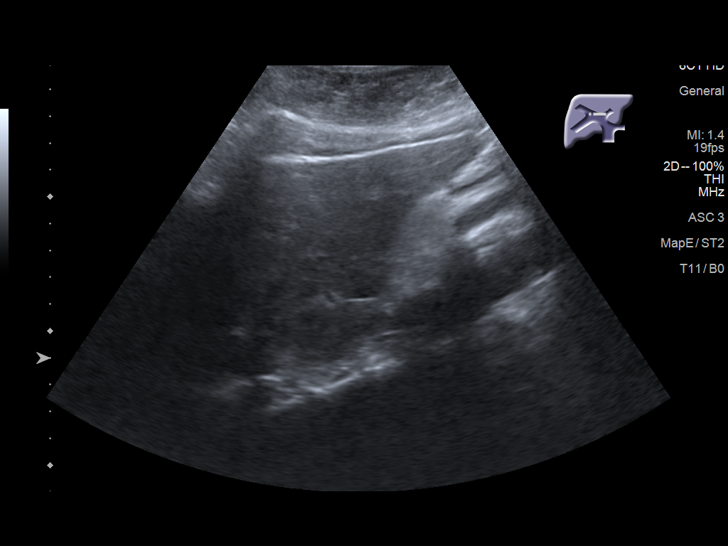
[im 48/89]
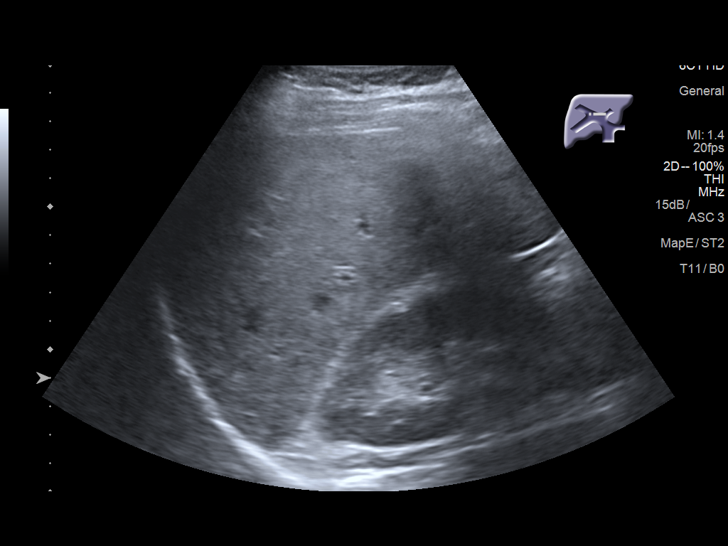
[im 56/89]
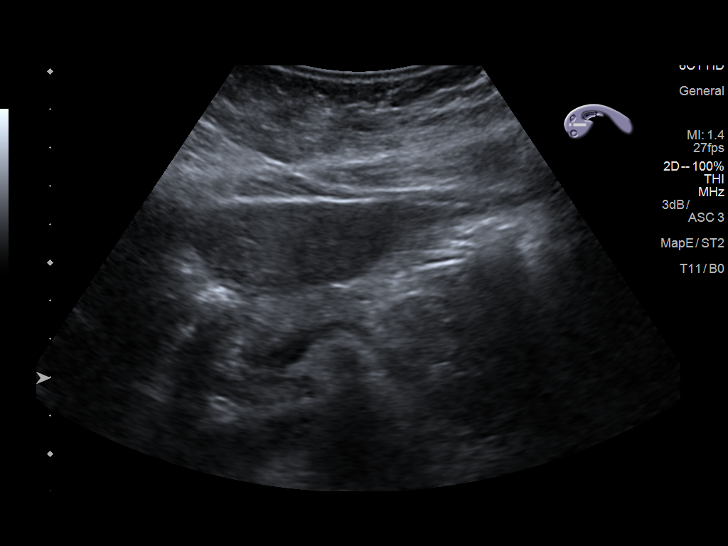
[im 59/89]
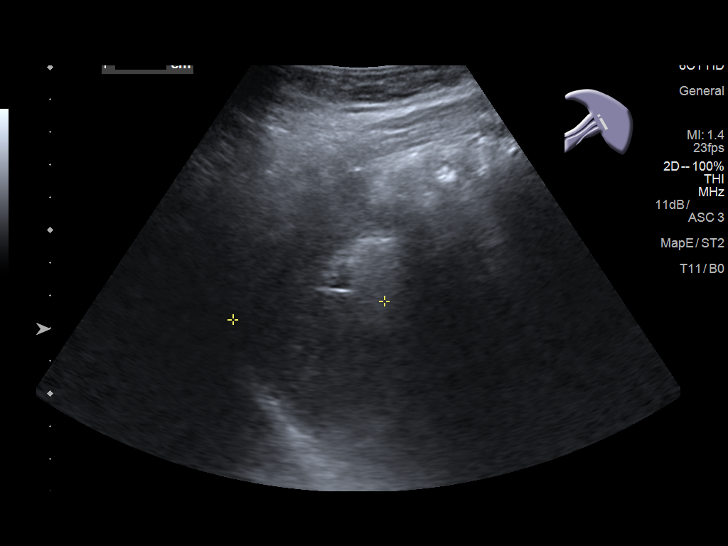
[im 67/89]
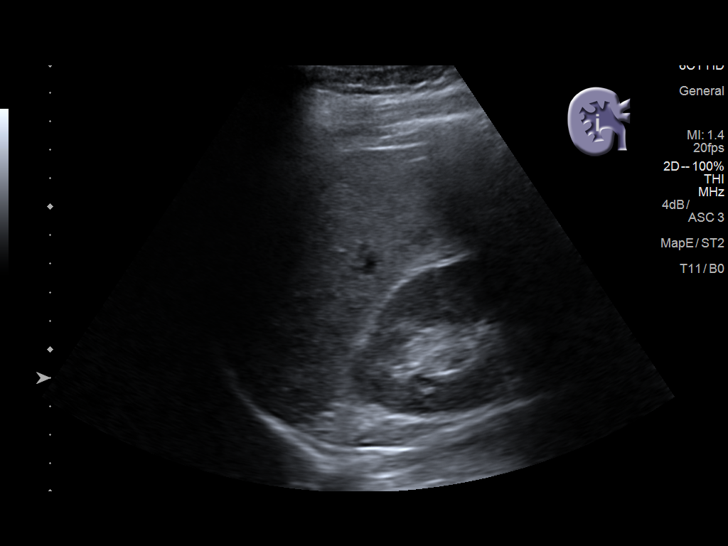
[im 74/89]
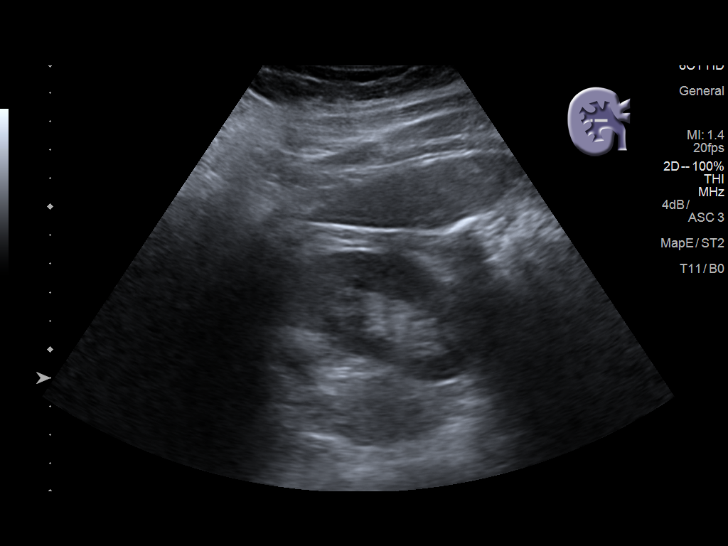
[im 81/89]
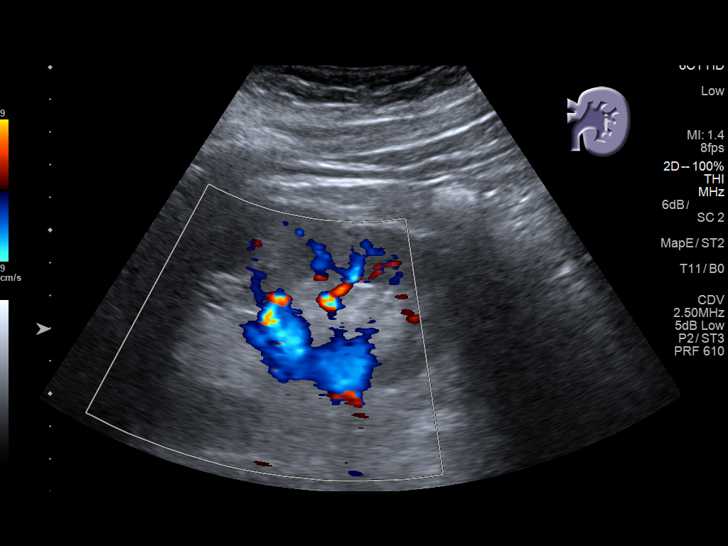
[im 89/89]
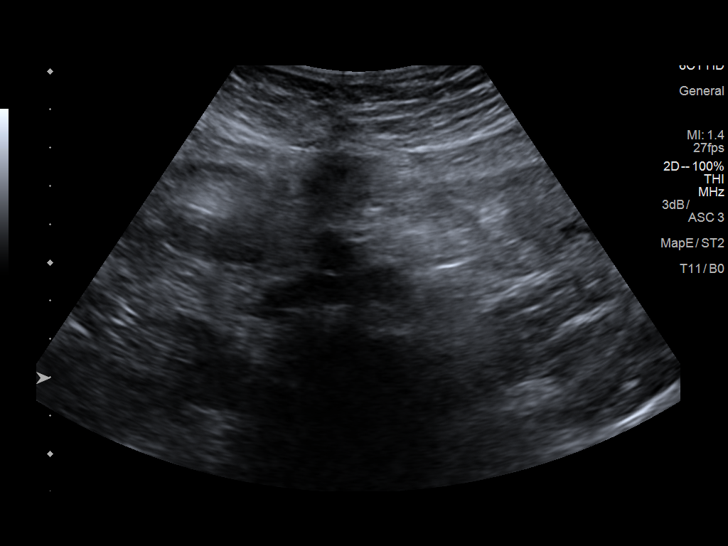

[14 of 25 positions shown; findings below may reference images not displayed]

FINDINGS: Gallbladder: Multiple stones, the largest 1.8 cm in the gallbladder
neck. Gallbladder wall borderline in thickness at 3 mm. Negative
sonographic [place].

Common bile duct: Diameter: Normal caliber, 5 mm

Liver: No focal lesion identified. Within normal limits in
parenchymal echogenicity. Portal vein is patent on color Doppler
imaging with normal direction of blood flow towards the liver.

IVC: No abnormality visualized.

Pancreas: Visualized portion unremarkable.

Spleen: Size and appearance within normal limits.

Right Kidney: Length: 12.3 cm. Echogenicity within normal limits. No
mass or hydronephrosis visualized.

Left Kidney: Length: 12.4 cm. Echogenicity within normal limits. No
mass or hydronephrosis visualized.

Abdominal aorta: No aneurysm visualized.

Other findings: None.
IMPRESSION: Cholelithiasis. Borderline gallbladder wall thickness may reflect
chronic cholecystitis. There is no sonographic Murphy sign.

## 2018-09-18 ENCOUNTER — Inpatient Hospital Stay (HOSPITAL_COMMUNITY)
Admission: EM | Admit: 2018-09-18 | Discharge: 2018-09-19 | DRG: 638 | Disposition: A | Discharge status: Home / Self Care | Payer: Medicaid Other | Attending: Family Medicine | Admitting: Family Medicine

## 2018-09-18 ENCOUNTER — Other Ambulatory Visit: Payer: Self-pay

## 2018-09-18 ENCOUNTER — Encounter (HOSPITAL_COMMUNITY): Payer: Self-pay | Admitting: Emergency Medicine

## 2018-09-18 DIAGNOSIS — E1169 Type 2 diabetes mellitus with other specified complication: Secondary | ICD-10-CM

## 2018-09-18 DIAGNOSIS — Z1159 Encounter for screening for other viral diseases: Secondary | ICD-10-CM | POA: Diagnosis not present

## 2018-09-18 DIAGNOSIS — Z560 Unemployment, unspecified: Secondary | ICD-10-CM | POA: Diagnosis not present

## 2018-09-18 DIAGNOSIS — I480 Paroxysmal atrial fibrillation: Secondary | ICD-10-CM | POA: Diagnosis present

## 2018-09-18 DIAGNOSIS — Z841 Family history of disorders of kidney and ureter: Secondary | ICD-10-CM | POA: Diagnosis not present

## 2018-09-18 DIAGNOSIS — N183 Chronic kidney disease, stage 3 unspecified: Secondary | ICD-10-CM | POA: Diagnosis present

## 2018-09-18 DIAGNOSIS — Z8249 Family history of ischemic heart disease and other diseases of the circulatory system: Secondary | ICD-10-CM

## 2018-09-18 DIAGNOSIS — Z833 Family history of diabetes mellitus: Secondary | ICD-10-CM | POA: Diagnosis not present

## 2018-09-18 DIAGNOSIS — Z79899 Other long term (current) drug therapy: Secondary | ICD-10-CM | POA: Diagnosis not present

## 2018-09-18 DIAGNOSIS — E1110 Type 2 diabetes mellitus with ketoacidosis without coma: Secondary | ICD-10-CM | POA: Diagnosis present

## 2018-09-18 DIAGNOSIS — Z7984 Long term (current) use of oral hypoglycemic drugs: Secondary | ICD-10-CM | POA: Diagnosis not present

## 2018-09-18 DIAGNOSIS — Z820 Family history of epilepsy and other diseases of the nervous system: Secondary | ICD-10-CM | POA: Diagnosis not present

## 2018-09-18 DIAGNOSIS — Z9114 Patient's other noncompliance with medication regimen: Secondary | ICD-10-CM

## 2018-09-18 DIAGNOSIS — E860 Dehydration: Secondary | ICD-10-CM | POA: Diagnosis present

## 2018-09-18 DIAGNOSIS — Z7952 Long term (current) use of systemic steroids: Secondary | ICD-10-CM

## 2018-09-18 DIAGNOSIS — E119 Type 2 diabetes mellitus without complications: Secondary | ICD-10-CM

## 2018-09-18 DIAGNOSIS — I129 Hypertensive chronic kidney disease with stage 1 through stage 4 chronic kidney disease, or unspecified chronic kidney disease: Secondary | ICD-10-CM | POA: Diagnosis present

## 2018-09-18 DIAGNOSIS — H9319 Tinnitus, unspecified ear: Secondary | ICD-10-CM | POA: Diagnosis present

## 2018-09-18 DIAGNOSIS — E1122 Type 2 diabetes mellitus with diabetic chronic kidney disease: Secondary | ICD-10-CM | POA: Diagnosis present

## 2018-09-18 DIAGNOSIS — H9190 Unspecified hearing loss, unspecified ear: Secondary | ICD-10-CM | POA: Diagnosis present

## 2018-09-18 DIAGNOSIS — Z7901 Long term (current) use of anticoagulants: Secondary | ICD-10-CM | POA: Diagnosis not present

## 2018-09-18 DIAGNOSIS — E875 Hyperkalemia: Secondary | ICD-10-CM | POA: Diagnosis present

## 2018-09-18 DIAGNOSIS — Z791 Long term (current) use of non-steroidal anti-inflammatories (NSAID): Secondary | ICD-10-CM | POA: Diagnosis not present

## 2018-09-18 DIAGNOSIS — N179 Acute kidney failure, unspecified: Secondary | ICD-10-CM | POA: Diagnosis present

## 2018-09-18 DIAGNOSIS — E1310 Other specified diabetes mellitus with ketoacidosis without coma: Secondary | ICD-10-CM

## 2018-09-18 DIAGNOSIS — E669 Obesity, unspecified: Secondary | ICD-10-CM

## 2018-09-18 LAB — COMPREHENSIVE METABOLIC PANEL WITH GFR
ALT: 20 U/L (ref 0–44)
AST: 16 U/L (ref 15–41)
Albumin: 4 g/dL (ref 3.5–5.0)
Alkaline Phosphatase: 155 U/L — ABNORMAL HIGH (ref 38–126)
Anion gap: 19 — ABNORMAL HIGH (ref 5–15)
BUN: 65 mg/dL — ABNORMAL HIGH (ref 6–20)
CO2: 19 mmol/L — ABNORMAL LOW (ref 22–32)
Calcium: 9.5 mg/dL (ref 8.9–10.3)
Chloride: 97 mmol/L — ABNORMAL LOW (ref 98–111)
Creatinine, Ser: 1.96 mg/dL — ABNORMAL HIGH (ref 0.44–1.00)
GFR calc Af Amer: 33 mL/min — ABNORMAL LOW
GFR calc non Af Amer: 28 mL/min — ABNORMAL LOW
Glucose, Bld: 655 mg/dL (ref 70–99)
Potassium: 6.1 mmol/L — ABNORMAL HIGH (ref 3.5–5.1)
Sodium: 135 mmol/L (ref 135–145)
Total Bilirubin: 0.9 mg/dL (ref 0.3–1.2)
Total Protein: 8.1 g/dL (ref 6.5–8.1)

## 2018-09-18 LAB — BASIC METABOLIC PANEL
Anion gap: 12 (ref 5–15)
Anion gap: 13 (ref 5–15)
Anion gap: 9 (ref 5–15)
Anion gap: 11 (ref 5–15)
BUN: 59 mg/dL — ABNORMAL HIGH (ref 6–20)
BUN: 61 mg/dL — ABNORMAL HIGH (ref 6–20)
BUN: 56 mg/dL — ABNORMAL HIGH (ref 6–20)
BUN: 54 mg/dL — ABNORMAL HIGH (ref 6–20)
CO2: 21 mmol/L — ABNORMAL LOW (ref 22–32)
CO2: 22 mmol/L (ref 22–32)
CO2: 23 mmol/L (ref 22–32)
CO2: 21 mmol/L — ABNORMAL LOW (ref 22–32)
Calcium: 9.3 mg/dL (ref 8.9–10.3)
Calcium: 9.4 mg/dL (ref 8.9–10.3)
Calcium: 9.1 mg/dL (ref 8.9–10.3)
Calcium: 8.9 mg/dL (ref 8.9–10.3)
Chloride: 103 mmol/L (ref 98–111)
Chloride: 106 mmol/L (ref 98–111)
Chloride: 107 mmol/L (ref 98–111)
Chloride: 108 mmol/L (ref 98–111)
Creatinine, Ser: 1.64 mg/dL — ABNORMAL HIGH (ref 0.44–1.00)
Creatinine, Ser: 1.69 mg/dL — ABNORMAL HIGH (ref 0.44–1.00)
Creatinine, Ser: 1.44 mg/dL — ABNORMAL HIGH (ref 0.44–1.00)
Creatinine, Ser: 1.33 mg/dL — ABNORMAL HIGH (ref 0.44–1.00)
GFR calc Af Amer: 41 mL/min — ABNORMAL LOW (ref >60)
GFR calc Af Amer: 39 mL/min — ABNORMAL LOW (ref >60)
GFR calc Af Amer: 48 mL/min — ABNORMAL LOW (ref >60)
GFR calc Af Amer: 53 mL/min — ABNORMAL LOW (ref >60)
GFR calc non Af Amer: 35 mL/min — ABNORMAL LOW (ref >60)
GFR calc non Af Amer: 34 mL/min — ABNORMAL LOW (ref >60)
GFR calc non Af Amer: 41 mL/min — ABNORMAL LOW (ref >60)
GFR calc non Af Amer: 46 mL/min — ABNORMAL LOW (ref >60)
Glucose, Bld: 419 mg/dL — ABNORMAL HIGH (ref 70–99)
Glucose, Bld: 224 mg/dL — ABNORMAL HIGH (ref 70–99)
Glucose, Bld: 181 mg/dL — ABNORMAL HIGH (ref 70–99)
Glucose, Bld: 185 mg/dL — ABNORMAL HIGH (ref 70–99)
Potassium: 5.5 mmol/L — ABNORMAL HIGH (ref 3.5–5.1)
Potassium: 4.9 mmol/L (ref 3.5–5.1)
Potassium: 5.4 mmol/L — ABNORMAL HIGH (ref 3.5–5.1)
Potassium: 5.5 mmol/L — ABNORMAL HIGH (ref 3.5–5.1)
Sodium: 136 mmol/L (ref 135–145)
Sodium: 141 mmol/L (ref 135–145)
Sodium: 139 mmol/L (ref 135–145)
Sodium: 140 mmol/L (ref 135–145)

## 2018-09-18 LAB — MRSA PCR SCREENING: MRSA by PCR: NEGATIVE

## 2018-09-18 LAB — HEMOGLOBIN A1C
Hgb A1c MFr Bld: 18.3 % — ABNORMAL HIGH (ref 4.8–5.6)
Mean Plasma Glucose: 478.51 mg/dL

## 2018-09-18 LAB — GLUCOSE, CAPILLARY
Glucose-Capillary: 287 mg/dL — ABNORMAL HIGH (ref 70–99)
Glucose-Capillary: 190 mg/dL — ABNORMAL HIGH (ref 70–99)
Glucose-Capillary: 127 mg/dL — ABNORMAL HIGH (ref 70–99)
Glucose-Capillary: 128 mg/dL — ABNORMAL HIGH (ref 70–99)
Glucose-Capillary: 160 mg/dL — ABNORMAL HIGH (ref 70–99)
Glucose-Capillary: 163 mg/dL — ABNORMAL HIGH (ref 70–99)
Glucose-Capillary: 149 mg/dL — ABNORMAL HIGH (ref 70–99)
Glucose-Capillary: 164 mg/dL — ABNORMAL HIGH (ref 70–99)

## 2018-09-18 LAB — CBC
HCT: 42.7 % (ref 36.0–46.0)
Hemoglobin: 13.7 g/dL (ref 12.0–15.0)
MCH: 28.7 pg (ref 26.0–34.0)
MCHC: 32.1 g/dL (ref 30.0–36.0)
MCV: 89.3 fL (ref 80.0–100.0)
Platelets: 356 10*3/uL (ref 150–400)
RBC: 4.78 MIL/uL (ref 3.87–5.11)
RDW: 13.6 % (ref 11.5–15.5)
WBC: 12.9 10*3/uL — ABNORMAL HIGH (ref 4.0–10.5)
nRBC: 0 % (ref 0.0–0.2)

## 2018-09-18 LAB — CBG MONITORING, ED
Glucose-Capillary: 547 mg/dL (ref 70–99)
Glucose-Capillary: 567 mg/dL (ref 70–99)
Glucose-Capillary: 474 mg/dL — ABNORMAL HIGH (ref 70–99)
Glucose-Capillary: 415 mg/dL — ABNORMAL HIGH (ref 70–99)
Glucose-Capillary: 340 mg/dL — ABNORMAL HIGH (ref 70–99)

## 2018-09-18 LAB — URINALYSIS, ROUTINE W REFLEX MICROSCOPIC
Bacteria, UA: NONE SEEN
Bilirubin Urine: NEGATIVE
Glucose, UA: >=500 mg/dL — AB
Hgb urine dipstick: NEGATIVE
Ketones, ur: 20 mg/dL — AB
Leukocytes,Ua: NEGATIVE
Nitrite: NEGATIVE
Protein, ur: 30 mg/dL — AB
Specific Gravity, Urine: 1.026 (ref 1.005–1.030)
pH: 6 (ref 5.0–8.0)

## 2018-09-18 LAB — PREGNANCY, URINE: Preg Test, Ur: NEGATIVE

## 2018-09-18 LAB — TROPONIN I: Troponin I: <0.03 ng/mL

## 2018-09-18 LAB — SARS CORONAVIRUS 2 BY RT PCR (HOSPITAL ORDER, PERFORMED IN ~~LOC~~ HOSPITAL LAB): SARS Coronavirus 2: NEGATIVE

## 2018-09-18 MED ORDER — SODIUM CHLORIDE 0.9 % IV BOLUS
1000.0000 mL | Freq: Once | INTRAVENOUS | Status: AC
  Administered 2018-09-18: 1000 mL via INTRAVENOUS

## 2018-09-18 MED ORDER — DEXTROSE-NACL 5-0.45 % IV SOLN
INTRAVENOUS | Status: DC
  Administered 2018-09-18: 15:00:00 via INTRAVENOUS

## 2018-09-18 MED ORDER — FLECAINIDE ACETATE 100 MG PO TABS
100.0000 mg | ORAL_TABLET | Freq: Two times a day (BID) | ORAL | Status: DC
  Administered 2018-09-18 – 2018-09-19 (×3): 100 mg via ORAL
  Filled 2018-09-18 (×3): qty 1

## 2018-09-18 MED ORDER — ONDANSETRON HCL 4 MG/2ML IJ SOLN
4.0000 mg | Freq: Once | INTRAMUSCULAR | Status: DC
  Filled 2018-09-18: qty 2

## 2018-09-18 MED ORDER — METOPROLOL TARTRATE 25 MG PO TABS
25.0000 mg | ORAL_TABLET | Freq: Two times a day (BID) | ORAL | Status: DC
  Administered 2018-09-18 – 2018-09-19 (×3): 25 mg via ORAL
  Filled 2018-09-18 (×3): qty 1

## 2018-09-18 MED ORDER — ACETAMINOPHEN 325 MG PO TABS: 650.0000 mg | ORAL_TABLET | Freq: Four times a day (QID) | ORAL | Status: DC | PRN

## 2018-09-18 MED ORDER — DILTIAZEM HCL ER COATED BEADS 120 MG PO CP24
120.0000 mg | ORAL_CAPSULE | Freq: Every day | ORAL | Status: DC
  Administered 2018-09-18 – 2018-09-19 (×2): 120 mg via ORAL
  Filled 2018-09-18 (×2): qty 1

## 2018-09-18 MED ORDER — SODIUM CHLORIDE 0.9 % IV SOLN
INTRAVENOUS | Status: DC
  Administered 2018-09-18: 15:00:00 via INTRAVENOUS

## 2018-09-18 MED ORDER — ONDANSETRON HCL 4 MG/2ML IJ SOLN: 4.0000 mg | Freq: Four times a day (QID) | INTRAMUSCULAR | Status: DC | PRN

## 2018-09-18 MED ORDER — INSULIN ASPART 100 UNIT/ML ~~LOC~~ SOLN
0.0000 [IU] | Freq: Three times a day (TID) | SUBCUTANEOUS | Status: DC
  Administered 2018-09-18: 3 [IU] via SUBCUTANEOUS
  Administered 2018-09-19: 8 [IU] via SUBCUTANEOUS
  Administered 2018-09-19: 5 [IU] via SUBCUTANEOUS

## 2018-09-18 MED ORDER — SODIUM POLYSTYRENE SULFONATE 15 GM/60ML PO SUSP: 15.0000 g | Freq: Once | ORAL | Status: DC

## 2018-09-18 MED ORDER — ENOXAPARIN SODIUM 40 MG/0.4ML ~~LOC~~ SOLN: 40.0000 mg | SUBCUTANEOUS | Status: DC

## 2018-09-18 MED ORDER — ONDANSETRON HCL 4 MG PO TABS: 4.0000 mg | ORAL_TABLET | Freq: Four times a day (QID) | ORAL | Status: DC | PRN

## 2018-09-18 MED ORDER — INSULIN REGULAR(HUMAN) IN NACL 100-0.9 UT/100ML-% IV SOLN
INTRAVENOUS | Status: DC
  Administered 2018-09-18: 5.1 [IU]/h via INTRAVENOUS
  Filled 2018-09-18: qty 100

## 2018-09-18 MED ORDER — APIXABAN 5 MG PO TABS
5.0000 mg | ORAL_TABLET | Freq: Two times a day (BID) | ORAL | Status: DC
  Administered 2018-09-18 – 2018-09-19 (×2): 5 mg via ORAL
  Filled 2018-09-18 (×2): qty 1

## 2018-09-18 MED ORDER — ACETAMINOPHEN 650 MG RE SUPP: 650.0000 mg | Freq: Four times a day (QID) | RECTAL | Status: DC | PRN

## 2018-09-18 MED ORDER — INSULIN GLARGINE 100 UNIT/ML ~~LOC~~ SOLN
20.0000 [IU] | Freq: Every day | SUBCUTANEOUS | Status: DC
  Administered 2018-09-18 – 2018-09-19 (×2): 20 [IU] via SUBCUTANEOUS
  Filled 2018-09-18 (×2): qty 0.2

## 2018-09-18 NOTE — ED Provider Notes (Signed)
[facility] DEPT Provider Note   CSN: [id number] Arrival date & time: 09/18/18  0506     History   Chief Complaint Chief Complaint  Patient presents with   Hyperglycemia    HPI GENAVIVE KUBICKI is a 53 y.o. female.     Patient is a 53 year old female with past medical history of diabetes, paroxysmal A. fib, hypertension.  She presents today for evaluation of elevated blood sugar.  Patient states that her sugars have been reading over 500 for the past 3 days.  She denies any change in her diet.  She denies any fevers, chills.  She denies any chest pain or shortness of breath.  Patient was started on prednisone by her ENT for an inner ear infection 5 days ago.  Patient has been diabetic for 15 years, but denies use of insulin in the past.  Her only diabetic medication is metformin.  The history is provided by the patient.  Hyperglycemia Blood sugar level PTA:  >500 Severity:  Moderate Onset quality:  Sudden Duration:  3 days Timing:  Constant Progression:  Worsening Chronicity:  New   Past Medical History:  Diagnosis Date   Asthma    Complication of anesthesia    "takes me awhile to wakeup" (05/17/2017)   Hypertension    Intractable nausea and vomiting    Paroxysmal atrial fibrillation ([place])    Type II diabetes mellitus ([place])     Patient Active Problem List   Diagnosis Date Noted   Leukocytosis 05/17/2017   Hypokalemia 01/02/2017   SIRS (systemic inflammatory response syndrome) ([place]) 12/31/2016   Hemoperitoneum 12/31/2016   Intractable nausea and vomiting 12/31/2016   AKI (acute kidney injury) ([place]) 12/31/2016   Paroxysmal atrial fibrillation ([place])    Asthma    Diabetes mellitus without complication ([place]) 32/99/2426    Past Surgical History:  Procedure Laterality Date   CESAREAN SECTION  2003   CHOLECYSTECTOMY N/A 12/23/2016   Procedure: LAPAROSCOPIC CHOLECYSTECTOMY;  Surgeon: Mickeal Skinner, MD;   Location: [facility];  Service: General;  Laterality: N/A;   EYE SURGERY Right ~ 2016   "bleeding issues; lasered; cauterized leaks"   KNEE ARTHROSCOPY Right X 2   SPLIT NIGHT STUDY  08/06/2015   TUBAL LIGATION  2003     OB History   No obstetric history on file.      Home Medications    Prior to Admission medications   Medication Sig Start Date End Date Taking? Authorizing Provider  apixaban (ELIQUIS) 5 MG TABS tablet Take 1 tablet (5 mg total) by mouth 2 (two) times daily. 07/11/15   Sherran Needs, NP  diltiazem (CARDIZEM CD) 120 MG 24 hr capsule TAKE 1 CAPSULE(120 MG) BY MOUTH DAILY 12/09/17   Sherran Needs, NP  flecainide (TAMBOCOR) 100 MG tablet Take 1 tablet (100 mg total) by mouth 2 (two) times daily. 07/27/17   Sherran Needs, NP  metoprolol tartrate (LOPRESSOR) 25 MG tablet TAKE 1 TABLET BY MOUTH TWICE DAILY 02/01/18   Sherran Needs, NP  VICTOZA 18 MG/3ML SOPN Inject 1.8 mg into the skin daily. 08/17/15   [provider]    Family History Family History  Problem Relation Age of Onset   Epilepsy Father    Diabetes Mother    Kidney disease Mother    Hypertension Mother    Kidney disease Brother    Diabetic kidney disease Brother    Diabetes Brother     Social History Social History  Tobacco Use   Smoking status: Never Smoker   Smokeless tobacco: Never Used  Substance Use Topics   Alcohol use: Yes    Comment: 05/17/2017 "glass of wine q other weekend"   Drug use: No     Allergies   Patient has no known allergies.   Review of Systems Review of Systems  All other systems reviewed and are negative.    Physical Exam Updated Vital Signs BP (!) 164/113 (BP Location: Right Arm)    Pulse (!) 110    Temp 98.6 F (37 C) (Oral)    Resp 18    Ht 5\' 2"  (1.575 m)    Wt 93.9 kg    SpO2 100%    BMI 37.86 kg/m   Physical Exam Vitals signs and nursing note reviewed.  Constitutional:      General: She is not in acute distress.     Appearance: She is well-developed. She is not diaphoretic.  HENT:     Head: Normocephalic and atraumatic.     Mouth/Throat:     Mouth: Mucous membranes are moist.  Neck:     Musculoskeletal: Normal range of motion and neck supple.  Cardiovascular:     Rate and Rhythm: Normal rate and regular rhythm.     Heart sounds: No murmur. No friction rub. No gallop.   Pulmonary:     Effort: Pulmonary effort is normal. No respiratory distress.     Breath sounds: Normal breath sounds. No wheezing.  Abdominal:     General: Bowel sounds are normal. There is no distension.     Palpations: Abdomen is soft.     Tenderness: There is no abdominal tenderness.  Musculoskeletal: Normal range of motion.  Skin:    General: Skin is warm and dry.  Neurological:     General: No focal deficit present.     Mental Status: She is alert and oriented to person, place, and time.     Cranial Nerves: No cranial nerve deficit.     Motor: No weakness.      ED Treatments / Results  Labs (all labs ordered are listed, but only abnormal results are displayed) Labs Reviewed  CBG MONITORING, ED - Abnormal; Notable for the following components:      Result Value   Glucose-Capillary 547 (*)    All other components within normal limits  CBC  URINALYSIS, ROUTINE W REFLEX MICROSCOPIC  COMPREHENSIVE METABOLIC PANEL  TROPONIN I  CBG MONITORING, ED    EKG EKG Interpretation  Date/Time:  Sunday September 18 2018 06:05:49 EDT Ventricular Rate:  99 PR Interval:    QRS Duration: 101 QT Interval:  370 QTC Calculation: 475 R Axis:   143 Text Interpretation:  Sinus rhythm Biatrial enlargement Left posterior fascicular block Confirmed by Geoffery LyonseLo, Huma Imhoff ([id number]) on 09/18/2018 6:36:28 AM   Radiology No results found.  Procedures Procedures (including critical care time)  Medications Ordered in ED Medications  sodium chloride 0.9 % bolus 1,000 mL (has no administration in time range)  ondansetron (ZOFRAN) injection 4 mg  (has no administration in time range)     Initial Impression / Assessment and Plan / ED Course  I have reviewed the triage vital signs and the nursing notes.  Pertinent labs & imaging results that were available during my care of the patient were reviewed by me and considered in my medical decision making (see chart for details).  Patient is a type II diabetic currently taking metformin.  She presents with a 3-day history  of elevated blood sugar and not feeling well.  She has had several episodes of vomiting.  Her work-up shows a blood sugar of 650 and a trend toward DKA.  She also has an acute kidney injury as well as an elevated potassium of 6.1.  Patient given IV hydration and will be started on the glucose stabilizer.  Patient on oral steroids for an inner ear infection.  I suspect that this is driving her sugars upward and may be a contributing factor.  I have spoken with Dr. Jomarie LongsJoseph who agrees to admit.  CRITICAL CARE Performed by: Geoffery Lyonsouglas Gale Hulse Total critical care time: 35 minutes Critical care time was exclusive of separately billable procedures and treating other patients. Critical care was necessary to treat or prevent imminent or life-threatening deterioration. Critical care was time spent personally by me on the following activities: development of treatment plan with patient and/or surrogate as well as nursing, discussions with consultants, evaluation of patient's response to treatment, examination of patient, obtaining history from patient or surrogate, ordering and performing treatments and interventions, ordering and review of laboratory studies, ordering and review of radiographic studies, pulse oximetry and re-evaluation of patient's condition.   Final Clinical Impressions(s) / ED Diagnoses   Final diagnoses:  None    ED Discharge Orders    None       Geoffery Lyonselo, Benjie Ricketson, MD 09/18/18 [phone]

## 2018-09-18 NOTE — ED Triage Notes (Signed)
Pt reports having readings of HI on home glucose machine. Pt states that the lowest was 563. Pt only on Metformin for DM

## 2018-09-18 NOTE — ED Notes (Signed)
Royce RN at bedside to assist this nurse to obtain IV site via [place].

## 2018-09-18 NOTE — ED Notes (Signed)
ED TO INPATIENT HANDOFF REPORT  Name/Age/Gender Dawn Braun 53 y.o. female  Code Status Code Status History    Date Active Date Inactive Code Status Order ID Comments User Context   05/17/2017 1056 05/20/2017 1716 Full Code [id number]  Gwenyth BenderBlack, Karen M, NP Inpatient   12/31/2016 2141 01/03/2017 1512 Full Code [id number]  Lorretta HarpNiu, Xilin, MD ED   12/23/2016 0148 12/29/2016 1451 Full Code [id number]  Darnell LevelGerkin, Todd, MD ED   Advance Care Planning Activity      Home/SNF/Other Home  Chief Complaint High Blood Sugar  Level of Care/Admitting Diagnosis ED Disposition    ED Disposition Condition Comment   Admit  [facility] [[id number]]  Level of Care: Stepdown [14]  Admit to SDU based on following criteria: Hemodynamic compromise or significant risk of instability:  Patient requiring short term acute titration and management of vasoactive drips, and invasive monitoring (i.e., CVP and Arterial line).  Covid Evaluation: Screening Protocol (No Symptoms)  Diagnosis: DKA (diabetic ketoacidoses) [facility](HCC) [[id number]]) [[id number]]  Admitting Physician: Zannie CoveJOSEPH, PREETHA [[id number]]  Attending Physician: Zannie CoveJOSEPH, PREETHA [[id number]]  Estimated length of stay: past midnight tomorrow  Certification:: I certify this patient will need inpatient services for at least 2 midnights  PT Class (Do Not Modify): Inpatient [101]  PT Acc Code (Do Not Modify): Private [1]       Medical History Past Medical History:  Diagnosis Date  . Asthma   . Complication of anesthesia    "takes me awhile to wakeup" (05/17/2017)  . Hypertension   . Intractable nausea and vomiting   . Paroxysmal atrial fibrillation (HCC)   . Type II diabetes mellitus (HCC)     Allergies No Known Allergies  IV Location/Drains/Wounds Patient Lines/Drains/Airways Status   Active Line/Drains/Airways    Name:   Placement date:   Placement time:   Site:   Days:   Peripheral IV 09/18/18 Left;Distal Forearm   09/18/18    0759    Forearm    less than 1   Peripheral IV 09/18/18 Left;Proximal Forearm   09/18/18    0800    Forearm   less than 1          Labs/Imaging Results for orders placed or performed during the hospital encounter of 09/18/18 (from the past 48 hour(s))  CBG monitoring, ED     Status: Abnormal   Collection Time: 09/18/18  5:35 AM  Result Value Ref Range   Glucose-Capillary 547 (HH) 70 - 99 mg/dL   Comment 1 Notify RN   CBC     Status: Abnormal   Collection Time: 09/18/18  6:27 AM  Result Value Ref Range   WBC 12.9 (H) 4.0 - 10.5 K/uL   RBC 4.78 3.87 - 5.11 MIL/uL   Hemoglobin 13.7 12.0 - 15.0 g/dL   HCT 25.342.7 66.436.0 - 40.346.0 %   MCV 89.3 80.0 - 100.0 fL   MCH 28.7 26.0 - 34.0 pg   MCHC 32.1 30.0 - 36.0 g/dL   RDW 47.413.6 25.911.5 - 56.315.5 %   Platelets 356 150 - 400 K/uL   nRBC 0.0 0.0 - 0.2 %    Comment: Performed at [facility], [address] [place], [place] [address]  Comprehensive metabolic panel     Status: Abnormal   Collection Time: 09/18/18  6:27 AM  Result Value Ref Range   Sodium 135 135 - 145 mmol/L   Potassium 6.1 (H) 3.5 - 5.1 mmol/L   Chloride 97 (L) 98 - 111  mmol/L   CO2 19 (L) 22 - 32 mmol/L   Glucose, Bld 655 (HH) 70 - 99 mg/dL    Comment: CRITICAL RESULT CALLED TO, READ BACK BY AND VERIFIED WITH: HAMLET AT 0703 ON 09/18/2018 BY JPM     BUN 65 (H) 6 - 20 mg/dL   Creatinine, Ser 1.96 (H) 0.44 - 1.00 mg/dL   Calcium 9.5 8.9 - 10.3 mg/dL   Total Protein 8.1 6.5 - 8.1 g/dL   Albumin 4.0 3.5 - 5.0 g/dL   AST 16 15 - 41 U/L   ALT 20 0 - 44 U/L   Alkaline Phosphatase 155 (H) 38 - 126 U/L   Total Bilirubin 0.9 0.3 - 1.2 mg/dL   GFR calc non Af Amer 28 (L) >60 mL/min   GFR calc Af Amer 33 (L) >60 mL/min   Anion gap 19 (H) 5 - 15    Comment: Performed at [facility], [place] [address] [place], [place] [address]  Troponin I - Once     Status: None   Collection Time: 09/18/18  6:27 AM  Result Value Ref Range   Troponin I <0.03 <0.03 ng/mL    Comment:  Performed at [facility], [place] [address], [place], [place] [address]  CBG monitoring, ED     Status: Abnormal   Collection Time: 09/18/18  8:06 AM  Result Value Ref Range   Glucose-Capillary 567 (HH) 70 - 99 mg/dL   Comment 1 Notify RN   Urinalysis, Routine w reflex microscopic     Status: Abnormal   Collection Time: 09/18/18  8:18 AM  Result Value Ref Range   Color, Urine STRAW (A) YELLOW   APPearance CLEAR CLEAR   Specific Gravity, Urine 1.026 1.005 - 1.030   pH 6.0 5.0 - 8.0   Glucose, UA >=500 (A) NEGATIVE mg/dL   Hgb urine dipstick NEGATIVE NEGATIVE   Bilirubin Urine NEGATIVE NEGATIVE   Ketones, ur 20 (A) NEGATIVE mg/dL   Protein, ur 30 (A) NEGATIVE mg/dL   Nitrite NEGATIVE NEGATIVE   Leukocytes,Ua NEGATIVE NEGATIVE   RBC / HPF 0-5 0 - 5 RBC/hpf   WBC, UA 0-5 0 - 5 WBC/hpf   Bacteria, UA NONE SEEN NONE SEEN   Squamous Epithelial / LPF 0-5 0 - 5    Comment: Performed at [facility], [place] [address], [place], [place] [address]  Pregnancy, urine     Status: None   Collection Time: 09/18/18  8:18 AM  Result Value Ref Range   Preg Test, Ur NEGATIVE NEGATIVE    Comment:        THE SENSITIVITY OF THIS METHODOLOGY IS >20 mIU/mL. Performed at [facility], [place] [address] [place],  [address]   CBG monitoring, ED     Status: Abnormal   Collection Time: 09/18/18  9:29 AM  Result Value Ref Range   Glucose-Capillary 474 (H) 70 - 99 mg/dL  CBG monitoring, ED     Status: Abnormal   Collection Time: 09/18/18 10:46 AM  Result Value Ref Range   Glucose-Capillary 415 (H) 70 - 99 mg/dL  Basic metabolic panel     Status: Abnormal   Collection Time: 09/18/18 11:32 AM  Result Value Ref Range   Sodium 136 135 - 145 mmol/L   Potassium 5.5 (H) 3.5 - 5.1 mmol/L   Chloride 103 98 - 111 mmol/L   CO2 21 (L) 22 - 32 mmol/L   Glucose, Bld 419 (H) 70 -  99 mg/dL   BUN 59 (H) 6 - 20 mg/dL   Creatinine, Ser 9.141.64 (H) 0.44 - 1.00  mg/dL   Calcium 9.3 8.9 - 78.210.3 mg/dL   GFR calc non Af Amer 35 (L) >60 mL/min   GFR calc Af Amer 41 (L) >60 mL/min   Anion gap 12 5 - 15    Comment: Performed at [facility], [address] [place], [place] [address]  CBG monitoring, ED     Status: Abnormal   Collection Time: 09/18/18 12:19 PM  Result Value Ref Range   Glucose-Capillary 340 (H) 70 - 99 mg/dL   No results found.  Pending Labs Unresulted Labs (From admission, onward)    Start     Ordered   09/18/18 1400  Basic metabolic panel  Now then every 4 hours,   R (with STAT occurrences)     09/18/18 1009   Signed and Held  HIV antibody (Routine Testing)  Once,   R     Signed and Held   Signed and Held  CBC  (enoxaparin (LOVENOX)    CrCl >/= 30 ml/min)  Once,   R    Comments: Baseline for enoxaparin therapy IF NOT ALREADY DRAWN.  Notify MD if PLT < 100 K.    Signed and Held   Signed and Held  Creatinine, serum  (enoxaparin (LOVENOX)    CrCl >/= 30 ml/min)  Once,   R    Comments: Baseline for enoxaparin therapy IF NOT ALREADY DRAWN.    Signed and Held   Signed and Held  Creatinine, serum  (enoxaparin (LOVENOX)    CrCl >/= 30 ml/min)  Weekly,   R    Comments: while on enoxaparin therapy    Signed and Held   Signed and Held  Hemoglobin A1c  Add-on,   R     Signed and Held   Signed and Held  CBC  Tomorrow morning,   R     Signed and Held   Signed and Held  Basic metabolic panel  Tomorrow morning,   R     Signed and Held          Vitals/Pain Today's Vitals   09/18/18 1030 09/18/18 1100 09/18/18 1135 09/18/18 1200  BP: (!) 144/97 (!) 150/89 139/89 (!) 149/101  Pulse: 93  94 88  Resp: 14 10 20 11   Temp:      TempSrc:      SpO2: 100%  100% 97%  Weight:      Height:      PainSc:        Isolation Precautions No active isolations  Medications Medications  ondansetron (ZOFRAN) injection 4 mg (has no administration in time range)  dextrose 5 %-0.45 % sodium chloride infusion (has no  administration in time range)  insulin regular, human (MYXREDLIN) 100 units/ 100 mL infusion (5.6 Units/hr Intravenous Rate/Dose Change 09/18/18 1221)  sodium polystyrene (KAYEXALATE) 15 GM/60ML suspension 15 g (has no administration in time range)  sodium chloride 0.9 % bolus 1,000 mL (1,000 mLs Intravenous New Bag/Given 09/18/18 0804)    Mobility walks

## 2018-09-18 NOTE — ED Notes (Signed)
Attempted IV access x2. Unable to obtain access. Will put in a consult for IV team.

## 2018-09-18 NOTE — H&P (Addendum)
History and Physical    ARTURO FREUNDLICH YHC:[id number] DOB: 08-12-1965 DOA: 09/18/2018  Referring MD/NP/PA: EDP PCP:  Patient coming from: home  Chief Complaint: High CBGs  HPI: Dawn Braun is a 53 y.o. female with medical history significant for type 2 diabetes mellitus, hypertension, paroxysmal atrial fibrillation on Eliquis. -She reports that 2 months ago she developed a urine infection, was treated with 2 courses of antibiotics for this which she has completed, followed by Dr. Leonarda Salon with ENT, on Wednesday she was started on prednisone 60 mg daily with a taper for?  Nerve inflammation possibly vestibular neuritis, currently her blood sugars have been uncontrolled, 500 range and sometimes greater than 600, he takes metformin for type 2 diabetes. -Reports nausea and a few episodes of vomiting in the last couple of days, admits to poor p.o. intake, yesterday she took 40 mg of prednisone -denies any fevers or chills, ED Course: See room she was noted to have a CBG of 655, creatinine of 1.9, potassium of 6.1, bicarb of 19  Review of Systems: As per HPI otherwise 14 point review of systems negative.   Past Medical History:  Diagnosis Date  . Asthma   . Complication of anesthesia    "takes me awhile to wakeup" (05/17/2017)  . Hypertension   . Intractable nausea and vomiting   . Paroxysmal atrial fibrillation (HCC)   . Type II diabetes mellitus ([facility])     Past Surgical History:  Procedure Laterality Date  . CESAREAN SECTION  2003  . CHOLECYSTECTOMY N/A 12/23/2016   Procedure: LAPAROSCOPIC CHOLECYSTECTOMY;  Surgeon: Kieth Brightly Arta Bruce, MD;  Location: [facility];  Service: General;  Laterality: N/A;  . EYE SURGERY Right ~ 2016   "bleeding issues; lasered; cauterized leaks"  . KNEE ARTHROSCOPY Right X 2  . SPLIT NIGHT STUDY  08/06/2015  . TUBAL LIGATION  2003     reports that she has never smoked. She has never used smokeless tobacco. She reports current alcohol use. She reports  that she does not use drugs.  No Known Allergies  Family History  Problem Relation Age of Onset  . Epilepsy Father   . Diabetes Mother   . Kidney disease Mother   . Hypertension Mother   . Kidney disease Brother   . Diabetic kidney disease Brother   . Diabetes Brother      Prior to Admission medications   Medication Sig Start Date End Date Taking? Authorizing Provider  ibuprofen (ADVIL) 800 MG tablet Take 800 mg by mouth 3 (three) times daily as needed. 08/01/18  Yes [provider]  predniSONE (STERAPRED UNI-PAK 48 TAB) 10 MG (48) TBPK tablet Take 20-60 mg by mouth See admin instructions. PRED PACK Take 52m by mouth daily x4d, take 441mby mouth daily x4d, take 204my mouth daily x4d. 09/14/18  Yes [provider]  apixaban (ELIQUIS) 5 MG TABS tablet Take 1 tablet (5 mg total) by mouth 2 (two) times daily. 07/11/15   [facility] NeedsP  diltiazem (CARDIZEM CD) 120 MG 24 hr capsule TAKE 1 CAPSULE(120 MG) BY MOUTH DAILY 12/09/17   [facility] NeedsP  flecainide (TAMBOCOR) 100 MG tablet Take 1 tablet (100 mg total) by mouth 2 (two) times daily. 07/27/17   [facility] NeedsP  metoprolol tartrate (LOPRESSOR) 25 MG tablet TAKE 1 TABLET BY MOUTH TWICE DAILY 02/01/18   [facility] NeedsP  VICTOZA 18 MG/3ML SOPN Inject 1.8 mg into the skin daily. 08/17/15   [provider]    Physical Exam: Vitals:   09/18/18 0730 09/18/18 0745 09/18/18 0800 09/18/18 0900  BP: (!) 135/99  (!) 169/95 (!) 168/96  Pulse: 96 94  99  Resp: 12 12 12 14   Temp:      TempSrc:      SpO2: 100% 100%  99%  Weight:      Height:          Constitutional: Obese pleasant African-American female, laying in bed, awake alert oriented x3, no distress Vitals:   09/18/18 0730 09/18/18 0745 09/18/18 0800 09/18/18 0900  BP: (!) 135/99  (!) 169/95 (!) 168/96  Pulse: 96 94  99  Resp: 12 12 12 14   Temp:      TempSrc:      SpO2: 100% 100%  99%  Weight:      Height:       Eyes: PERRL, lids  and conjunctivae normal ENMT: Oral mucosa is dry Neck: normal, supple Respiratory: Clear bilaterally Cardiovascular: S1-S2/regular rhythm, tachycardic Abdomen: Obese, soft, nontender, bowel sounds present Musculoskeletal: No joint deformity upper and lower extremities. Ext: No edema Skin: no rashes, lesions, ulcers.  Neurologic: Moves all extremities, no localizing signs Psychiatric: Appropriate mood and affect  Labs on Admission: I have personally reviewed following labs and imaging studies  CBC: Recent Labs  Lab 09/18/18 0627  WBC 12.9*  HGB 13.7  HCT 42.7  MCV 89.3  PLT 903   Basic Metabolic Panel: Recent Labs  Lab 09/18/18 0627  NA 135  K 6.1*  CL 97*  CO2 19*  GLUCOSE 655*  BUN 65*  CREATININE 1.96*  CALCIUM 9.5   GFR: Estimated Creatinine Clearance: 35.4 mL/min (A) (by C-G formula based on SCr of 1.96 mg/dL (H)). Liver Function Tests: Recent Labs  Lab 09/18/18 0627  AST 16  ALT 20  ALKPHOS 155*  BILITOT 0.9  PROT 8.1  ALBUMIN 4.0   No results for input(s): LIPASE, AMYLASE in the last 168 hours. No results for input(s): AMMONIA in the last 168 hours. Coagulation Profile: No results for input(s): INR, PROTIME in the last 168 hours. Cardiac Enzymes: Recent Labs  Lab 09/18/18 0627  TROPONINI <0.03   BNP (last 3 results) No results for input(s): PROBNP in the last 8760 hours. HbA1C: No results for input(s): HGBA1C in the last 72 hours. CBG: Recent Labs  Lab 09/18/18 0535 09/18/18 0806  GLUCAP 547* 567*   Lipid Profile: No results for input(s): CHOL, HDL, LDLCALC, TRIG, CHOLHDL, LDLDIRECT in the last 72 hours. Thyroid Function Tests: No results for input(s): TSH, T4TOTAL, FREET4, T3FREE, THYROIDAB in the last 72 hours. Anemia Panel: No results for input(s): VITAMINB12, FOLATE, FERRITIN, TIBC, IRON, RETICCTPCT in the last 72 hours. Urine analysis:    Component Value Date/Time   COLORURINE STRAW (A) 09/18/2018 0818   APPEARANCEUR CLEAR  09/18/2018 0818   LABSPEC 1.026 09/18/2018 0818   PHURINE 6.0 09/18/2018 0818   GLUCOSEU >=500 (A) 09/18/2018 0818   HGBUR NEGATIVE 09/18/2018 0818   BILIRUBINUR NEGATIVE 09/18/2018 0818   KETONESUR 20 (A) 09/18/2018 0818   PROTEINUR 30 (A) 09/18/2018 0818   NITRITE NEGATIVE 09/18/2018 0818   LEUKOCYTESUR NEGATIVE 09/18/2018 0818   Sepsis Labs: @LABRCNTIP (procalcitonin:4,lacticidven:4) )No results found for this or any previous visit (from the past 240 hour(s)).   Radiological Exams on Admission: No results found.  EKG: Independently reviewed.  Biatrial enlargement, no acute ST-T wave changes  Assessment/Plan  Diabetic ketoacidosis -Triggered by recent prednisone use -Check hemoglobin A1c -Admit  to stepdown, glucose stabilizer and IV fluids per DKA protocol -Part of her metabolic acidosis is driven by her acute kidney injury -Check stat B met now  Type 2 diabetes mellitus -As above  Hyperkalemia -Due to acute kidney injury and NSAID use -She has already been started on an insulin drip in the emergency room will check stat be met now if potassium still elevated will give her potassium binding resin and or calcium chloride etc. -Will follow-up repeat labs  Acute kidney injury -Due to GI losses, dehydration and NSAID use -Hydrate with normal saline, at 100 cc an hour, transition to D5 with half-normal saline with CBGs less than 250  Hearing loss/tinnitus -Suspected vestibular neuritis -Followed by Dr. Melene Plan, started on prednisone on Wednesday, will hold prednisone dose today, will need to contact Dr. Melene Plan on Monday to determine alternate option is possible  Paroxysmal atrial fibrillation -In sinus rhythm at this time, continue diltiazem and Eliquis  DVT prophylaxis: Eliquis Code Status: Full code Family Communication: No family at bedside Disposition Plan: Admit to stepdown Consults called: None Admission status: Inpatient  Domenic [name] MD [facility]    09/18/2018, 9:13 AM

## 2018-09-18 NOTE — ED Notes (Signed)
Date and time results received: 09/18/18 7:06 AM   Test: Glucose Critical Value: 655   Name of Provider Notified: Dr. Stark Jock  Orders Received? Or Actions Taken?: see orders for details; will continue to monitor patient.

## 2018-09-19 DIAGNOSIS — E119 Type 2 diabetes mellitus without complications: Secondary | ICD-10-CM

## 2018-09-19 DIAGNOSIS — N179 Acute kidney failure, unspecified: Secondary | ICD-10-CM | POA: Diagnosis present

## 2018-09-19 DIAGNOSIS — N183 Chronic kidney disease, stage 3 unspecified: Secondary | ICD-10-CM | POA: Diagnosis present

## 2018-09-19 LAB — BASIC METABOLIC PANEL
Anion gap: 8 (ref 5–15)
Anion gap: 6 (ref 5–15)
BUN: 49 mg/dL — ABNORMAL HIGH (ref 6–20)
BUN: 49 mg/dL — ABNORMAL HIGH (ref 6–20)
CO2: 23 mmol/L (ref 22–32)
CO2: 25 mmol/L (ref 22–32)
Calcium: 8.5 mg/dL — ABNORMAL LOW (ref 8.9–10.3)
Calcium: 8.4 mg/dL — ABNORMAL LOW (ref 8.9–10.3)
Chloride: 107 mmol/L (ref 98–111)
Chloride: 106 mmol/L (ref 98–111)
Creatinine, Ser: 1.28 mg/dL — ABNORMAL HIGH (ref 0.44–1.00)
Creatinine, Ser: 1.27 mg/dL — ABNORMAL HIGH (ref 0.44–1.00)
GFR calc Af Amer: 55 mL/min — ABNORMAL LOW (ref >60)
GFR calc Af Amer: 56 mL/min — ABNORMAL LOW (ref >60)
GFR calc non Af Amer: 48 mL/min — ABNORMAL LOW (ref >60)
GFR calc non Af Amer: 48 mL/min — ABNORMAL LOW (ref >60)
Glucose, Bld: 232 mg/dL — ABNORMAL HIGH (ref 70–99)
Glucose, Bld: 267 mg/dL — ABNORMAL HIGH (ref 70–99)
Potassium: 4.6 mmol/L (ref 3.5–5.1)
Potassium: 4.6 mmol/L (ref 3.5–5.1)
Sodium: 138 mmol/L (ref 135–145)
Sodium: 137 mmol/L (ref 135–145)

## 2018-09-19 LAB — CBC
HCT: 37.9 % (ref 36.0–46.0)
Hemoglobin: 12.1 g/dL (ref 12.0–15.0)
MCH: 28.2 pg (ref 26.0–34.0)
MCHC: 31.9 g/dL (ref 30.0–36.0)
MCV: 88.3 fL (ref 80.0–100.0)
Platelets: 306 10*3/uL (ref 150–400)
RBC: 4.29 MIL/uL (ref 3.87–5.11)
RDW: 13.8 % (ref 11.5–15.5)
WBC: 11.7 10*3/uL — ABNORMAL HIGH (ref 4.0–10.5)
nRBC: 0 % (ref 0.0–0.2)

## 2018-09-19 LAB — GLUCOSE, CAPILLARY
Glucose-Capillary: 230 mg/dL — ABNORMAL HIGH (ref 70–99)
Glucose-Capillary: 287 mg/dL — ABNORMAL HIGH (ref 70–99)

## 2018-09-19 MED ORDER — CHLORHEXIDINE GLUCONATE CLOTH 2 % EX PADS
6.0000 | MEDICATED_PAD | Freq: Every day | CUTANEOUS | Status: DC
  Administered 2018-09-19: 6 via TOPICAL

## 2018-09-19 MED ORDER — GLIPIZIDE 5 MG PO TABS
5.0000 mg | ORAL_TABLET | Freq: Two times a day (BID) | ORAL | Status: DC
  Administered 2018-09-19: 5 mg via ORAL
  Filled 2018-09-19 (×2): qty 1

## 2018-09-19 MED ORDER — GLIPIZIDE 5 MG PO TABS: 5.0000 mg | ORAL_TABLET | Freq: Two times a day (BID) | ORAL | 0 refills | Status: DC

## 2018-09-19 NOTE — TOC Initial Note (Signed)
Transition of Care [facility]) - Initial/Assessment Note    Patient Details  Name: Dawn Braun MRN: [id number] Date of Birth: 24-Aug-1965  Transition of Care (TOC) CM/SW Contact:    Joaquin Courts, RN Phone Number: 09/19/2018, 2:52 PM  Clinical Narrative:  CM spoke with patient at bedside. Patient reports she lost her insurance after loosing her job and has had some difficulty affording medication.  Patient states she can afford regular generic medications but has a hard time with brand names such as her eliquis, which she hasn't taken for some time due to affordability issues.  Patient reports she goes to the [facility]. Cm spoke with clinic which reports there is a patient assistance program for eliquis that the patient can apply for through the clinic. Additionally, [facility] info was placed on chart should patient want to establish care if she cannot continue to afford to see her pcp.              Expected Discharge Plan: Home/Self Care Barriers to Discharge: No Barriers Identified   Patient Goals and CMS Choice        Expected Discharge Plan and Services Expected Discharge Plan: Home/Self Care   Discharge Planning Services: CM Consult   Living arrangements for the past 2 months: Single Family Home Expected Discharge Date: 09/19/18               DME Arranged: N/A DME Agency: NA       HH Arranged: NA [place] Agency: NA        Prior Living Arrangements/Services Living arrangements for the past 2 months: Single Family Home Lives with:: Spouse Patient language and need for interpreter reviewed:: Yes Do you feel safe going back to the place where you live?: Yes      Need for Family Participation in Patient Care: Yes (Comment) Care giver support system in place?: Yes (comment)   Criminal Activity/Legal Involvement Pertinent to Current Situation/Hospitalization: No - Comment as needed  Activities of Daily Living Home Assistive Devices/Equipment: Contact  lenses, CBG Meter, Eyeglasses ADL Screening (condition at time of admission) Patient's cognitive ability adequate to safely complete daily activities?: Yes Is the patient deaf or have difficulty hearing?: No Does the patient have difficulty seeing, even when wearing glasses/contacts?: No Does the patient have difficulty concentrating, remembering, or making decisions?: No Patient able to express need for assistance with ADLs?: Yes Does the patient have difficulty dressing or bathing?: No Independently performs ADLs?: Yes (appropriate for developmental age) Does the patient have difficulty walking or climbing stairs?: No Weakness of Legs: Both Weakness of Arms/Hands: None  Permission Sought/Granted                  Emotional Assessment Appearance:: Appears stated age Attitude/Demeanor/Rapport: Engaged Affect (typically observed): Accepting Orientation: : Oriented to Self, Oriented to Place, Oriented to  Time, Oriented to Situation   Psych Involvement: No (comment)  Admission diagnosis:  AKI (acute kidney injury) ([place]) [N17.9] Diabetic ketoacidosis without coma associated with other specified diabetes mellitus ([place]) [E13.10] Patient Active Problem List   Diagnosis Date Noted  . Acute renal failure superimposed on stage 3 chronic kidney disease ([place]) 09/19/2018  . DKA (diabetic ketoacidoses) ([place]) 09/18/2018  . Leukocytosis 05/17/2017  . Hypokalemia 01/02/2017  . SIRS (systemic inflammatory response syndrome) ([place]) 12/31/2016  . Hemoperitoneum 12/31/2016  . Intractable nausea and vomiting 12/31/2016  . AKI (acute kidney injury) ([place]) 12/31/2016  . Paroxysmal atrial fibrillation (HCC)   . Asthma   .  Diabetes mellitus without complication (HCC) 12/23/2016   PCP:  Renford DillsPolite, [name], MD Pharmacy[facility]   [id number] [place]- [place],  - [phone] [facility] [address] [place] [id number] Phone: [phone] Fax:  [phone]     Social Determinants of Health (SDOH) Interventions    Readmission Risk Interventions No flowsheet data found.

## 2018-09-19 NOTE — Discharge Instructions (Signed)
Diabetic Ketoacidosis  Diabetic ketoacidosis is a serious complication of diabetes. This condition develops when there is not enough insulin in the body. Insulin is an hormone that regulates blood sugar levels in the body. Normally, insulin allows glucose to enter the cells in the body. The cells break down glucose for energy. Without enough insulin, the body cannot break down glucose, so it breaks down fats instead. This leads to high blood glucose levels in the body and the production of acids that are called ketones. Ketones are poisonous at high levels.  If diabetic ketoacidosis is not treated, it can cause severe dehydration and can lead to a coma or death.  What are the causes?  This condition develops when a lack of insulin causes the body to break down fats instead of glucose. This may be triggered by:  · Stress on the body. This stress is brought on by an illness.  · Infection.  · Medicines that raise blood glucose levels.  · Not taking diabetes medicine.  · New onset of type 1 diabetes mellitus.  What are the signs or symptoms?  Symptoms of this condition include:  · Fatigue.  · Weight loss.  · Excessive thirst.  · Light-headedness.  · Fruity or sweet-smelling breath.  · Excessive urination.  · Vision changes.  · Confusion or irritability.  · Nausea.  · Vomiting.  · Rapid breathing.  · Abdominal pain.  · Feeling flushed.  How is this diagnosed?  This condition is diagnosed based on your medical history, a physical exam, and blood tests. You may also have a urine test to check for ketones.  How is this treated?  This condition may be treated with:  · Fluid replacement. This may be done to correct dehydration.  · Insulin injections. These may be given through the skin or through an IV tube.  · Electrolyte replacement. Electrolytes are minerals in your blood. Electrolytes such as potassium and sodium may be given in pill form or through an IV tube.  · Antibiotic medicines. These may be prescribed if your  condition was caused by an infection.  Diabetic ketoacidosis is a serious medical condition. You may need emergency treatment in the hospital to monitor your condition.  Follow these instructions at home:  Eating and drinking  · Drink enough fluids to keep your urine clear or pale yellow.  · If you are not able to eat, drink clear fluids in small amounts as you are able. Clear fluids include water, ice chips, fruit juice with water added (diluted), and low-calorie sports drinks. You may also have sugar-free jello or popsicles.  · If you are able to eat, follow your usual diet and drink sugar-free liquids, such as water.  Medicines  · Take over-the-counter and prescription medicines only as told by your health care provider.  · Continue to take insulin and other diabetes medicines as told by your health care provider.  · If you were prescribed an antibiotic, take it as told by your health care provider. Do not stop taking the antibiotic even if you start to feel better.  General instructions    · Check your urine for ketones when you are ill and as told by your health care provider.  ? If your blood glucose is 240 mg/dL (13.3 mmol/L) or higher, check your urine ketones every 4-6 hours.  · Check your blood glucose every day, as often as told by your health care provider.  ? If your blood glucose is high, drink   plenty of fluids. This helps to flush out ketones.  ? If your blood glucose is above your target for 2 tests in a row, contact your health care provider.  · Carry a medical alert card or wear medical alert jewelry that says that you have diabetes.  · Rest and exercise only as told by your health care provider. Do not exercise when your blood glucose is high and you have ketones in your urine.  · If you get sick, call your health care provider and begin treatment quickly. Your body often needs extra insulin to fight an illness. Check your blood glucose every 4-6 hours when you are sick.  · Keep all follow-up  visits as told by your health care provider. This is important.  Contact a health care provider if:  · Your blood glucose level is higher than 240 mg/dL (13.3 mmol/L) for 2 days in a row.  · You have moderate or large ketones in your urine.  · You have a fever.  · You cannot eat or drink without vomiting.  · You have been vomiting for more than 2 hours.  · You continue to have symptoms of diabetic ketoacidosis.  · You develop new symptoms.  Get help right away if:  · Your blood glucose monitor reads “high” even when you are taking insulin.  · You faint.  · You have chest pain.  · You have trouble breathing.  · You have sudden trouble speaking or swallowing.  · You have vomiting or diarrhea that gets worse after 3 hours.  · You are unable to stay awake.  · You have trouble thinking.  · You are severely dehydrated. Symptoms of severe dehydration include:  ? Extreme thirst.  ? Dry mouth.  ? Rapid breathing.  These symptoms may represent a serious problem that is an emergency. Do not wait to see if the symptoms will go away. Get medical help right away. Call your local emergency services (911 in the [place] Do not drive yourself to the hospital.  Summary  · Diabetic ketoacidosis is a serious complication of diabetes. This condition develops when there is not enough insulin in the body.  · This condition is diagnosed based on your medical history, a physical exam, and blood tests. You may also have a urine test to check for ketones.  · Diabetic ketoacidosis is a serious medical condition. You may need emergency treatment in the hospital to monitor your condition.  · Contact your health care provider if your blood glucose is higher than 240 mg/dl for 2 days in a row or if you have moderate or large ketones in your urine.  This information is not intended to replace advice given to you by your health care provider. Make sure you discuss any questions you have with your health care provider.  Document Released: 03/20/2000  Document Revised: 04/27/2016 Document Reviewed: 04/27/2016  Elsevier Interactive Patient Education © 2019 [employer]

## 2018-09-19 NOTE — Discharge Summary (Addendum)
Physician Discharge Summary  Dawn Braun WUJ:[id number] DOB: 03-Jan-1966 DOA: 09/18/2018  PCP: Renford DillsPolite, [name], MD  Admit date: 09/18/2018 Discharge date: 09/19/2018  Admitted From: Home Disposition: Home  Recommendations for Outpatient Follow-up:  1. Follow up with PCP in 1-2 weeks 2. Please obtain BMP/CBC in one week 3. Please follow up on the following pending results:  Home Health: None Equipment/Devices: None  Discharge Condition: Stable CODE STATUS: Full code Diet recommendation: Diabetic/consistent carbohydrate  Subjective: Seen and examined earlier.  No complaints.  Feels much better.  Brief/Interim Summary: Dawn Braun is a 53 year old African-American pleasant lady with a past medical history of type 2 diabetes mellitus, hypertension, paroxysmal atrial fibrillation on Eliquis who presented to ER yesterday with a complaint of increased blood sugar and was subsequently diagnosed with DKA in the emergency department and was admitted under hospitalist service in the stepdown unit and started on DKA protocol which included IV fluids and insulin drip.  She also had hyperkalemia which has resolved.  Soon her DKA also resolved and insulin drip was stopped and she was continued on IV fluids.  She is tolerating her diet very well.  Upon along discussion with her this morning, she tells me that she had lost her job and thus ended up losing her insurance back in October 2019 and since then she has not seen her PCP and she does not remember her past hemoglobin A1c however she received 8031-month supply of metformin at that time which is still lasting for her only because she has been skipping the doses here and there to save it for later.  She simply visited ENT and was diagnosed with vestibular neuritis and was started on prednisone on top of already having blood sugar of around 250 fasting every day before prednisone was started.  The combination of hyperglycemia, noncompliance with  the medication on top of prednisone had triggered the DKA on her.  Obviously she needs to continue her prednisone to prevent further hearing loss.  I did explain to her how important it is for her to stay compliant with her metformin and in addition to that, I have prescribed her glipizide 5 mg twice daily to take for now.  Ideally, she should see her PCP within 1 week so her antidiabetic regimen can be managed better however if she cannot do that then I have advised her to check her blood sugar daily in the morning before breakfast and if they remain over 200 then she should slowly increase her glipizide to 10 mg in the morning and 5 mg at night for a few days and watch her sugars again and if they continue to remain more than 200 then she can increase it to 10 mg twice daily.  Once again, this is not ideal management however patient has insurance issues for which she is not seeing her PCP so we are trying our best to manage her diabetes as an outpatient in the situations.  She is doing fine and hemodynamically stable so she will be discharged today.  Discharge Diagnoses:  Active Problems:   Diabetes mellitus without complication (HCC)   Paroxysmal atrial fibrillation (HCC)   DKA (diabetic ketoacidoses) (HCC)   Acute renal failure superimposed on stage 3 chronic kidney disease [facility](HCC)    Discharge Instructions  Discharge Instructions    Discharge patient   Complete by: As directed    Discharge disposition: 01-Home or Self Care   Discharge patient date: 09/19/2018     Allergies as of 09/19/2018  No Known Allergies     Medication List    TAKE these medications   apixaban 5 MG Tabs tablet Commonly known as: Eliquis Take 1 tablet (5 mg total) by mouth 2 (two) times daily.   diltiazem 120 MG 24 hr capsule Commonly known as: CARDIZEM CD TAKE 1 CAPSULE(120 MG) BY MOUTH DAILY   flecainide 100 MG tablet Commonly known as: TAMBOCOR Take 1 tablet (100 mg total) by mouth 2 (two) times daily.    glipiZIDE 5 MG tablet Commonly known as: GLUCOTROL Take 1 tablet (5 mg total) by mouth 2 (two) times daily before a meal for 30 days.   ibuprofen 800 MG tablet Commonly known as: ADVIL Take 800 mg by mouth 3 (three) times daily as needed.   metFORMIN 1000 MG tablet Commonly known as: GLUCOPHAGE Take 1,000 mg by mouth 2 (two) times daily with a meal.   metoprolol tartrate 25 MG tablet Commonly known as: LOPRESSOR TAKE 1 TABLET BY MOUTH TWICE DAILY   predniSONE 10 MG (48) Tbpk tablet Commonly known as: STERAPRED UNI-PAK 48 TAB Take 20-60 mg by mouth See admin instructions. PRED PACK Take 60mg  by mouth daily x4d, take 40mg  by mouth daily x4d, take 20mg  by mouth daily x4d.      Follow-up Information    Seward Carol, MD Follow up in 1 week(s).   Specialty: Internal Medicine Contact information: [address] [place] [address] [phone]          No Known Allergies  Consultations: None   Procedures/Studies:  No results found.   Discharge Exam: Vitals:   09/19/18 1200 09/19/18 1300  BP:  134/86  Pulse:  65  Resp:  (!) 9  Temp: 98 F (36.7 C)   SpO2:  100%   Vitals:   09/19/18 0800 09/19/18 0900 09/19/18 1200 09/19/18 1300  BP:  (!) 160/88  134/86  Pulse:  69  65  Resp:  15  (!) 9  Temp: 98.2 F (36.8 C)  98 F (36.7 C)   TempSrc: Oral  Oral   SpO2:  100%  100%  Weight:      Height:        General: Pt is alert, awake, not in acute distress Cardiovascular: RRR, S1/S2 +, no rubs, no gallops Respiratory: CTA bilaterally, no wheezing, no rhonchi Abdominal: Soft, NT, ND, bowel sounds + Extremities: no edema, no cyanosis    The results of significant diagnostics from this hospitalization (including imaging, microbiology, ancillary and laboratory) are listed below for reference.     Microbiology: Recent Results (from the past 240 hour(s))  MRSA PCR Screening     Status: None   Collection Time: 09/18/18  1:26 PM   Specimen:  Nasopharyngeal  Result Value Ref Range Status   MRSA by PCR NEGATIVE NEGATIVE Final    Comment:        The GeneXpert MRSA Assay (FDA approved for NASAL specimens only), is one component of a comprehensive MRSA colonization surveillance program. It is not intended to diagnose MRSA infection nor to guide or monitor treatment for MRSA infections. Performed at [facility], [place] [address], [place], [place] [address]   SARS Coronavirus 2 (CEPHEID - Performed in [facility] hospital lab), Hosp Order     Status: None   Collection Time: 09/18/18  6:59 PM   Specimen: Nasopharyngeal Swab  Result Value Ref Range Status   SARS Coronavirus 2 NEGATIVE NEGATIVE Final    Comment: (NOTE) If result  is NEGATIVE SARS-CoV-2 target nucleic acids are NOT DETECTED. The SARS-CoV-2 RNA is generally detectable in upper and lower  respiratory specimens during the acute phase of infection. The lowest  concentration of SARS-CoV-2 viral copies this assay can detect is 250  copies / mL. A negative result does not preclude SARS-CoV-2 infection  and should not be used as the sole basis for treatment or other  patient management decisions.  A negative result may occur with  improper specimen collection / handling, submission of specimen other  than nasopharyngeal swab, presence of viral mutation(s) within the  areas targeted by this assay, and inadequate number of viral copies  (<250 copies / mL). A negative result must be combined with clinical  observations, patient history, and epidemiological information. If result is POSITIVE SARS-CoV-2 target nucleic acids are DETECTED. The SARS-CoV-2 RNA is generally detectable in upper and lower  respiratory specimens dur ing the acute phase of infection.  Positive  results are indicative of active infection with SARS-CoV-2.  Clinical  correlation with patient history and other diagnostic information is  necessary to determine patient infection  status.  Positive results do  not rule out bacterial infection or co-infection with other viruses. If result is PRESUMPTIVE POSTIVE SARS-CoV-2 nucleic acids MAY BE PRESENT.   A presumptive positive result was obtained on the submitted specimen  and confirmed on repeat testing.  While 2019 novel coronavirus  (SARS-CoV-2) nucleic acids may be present in the submitted sample  additional confirmatory testing may be necessary for epidemiological  and / or clinical management purposes  to differentiate between  SARS-CoV-2 and other Sarbecovirus currently known to infect humans.  If clinically indicated additional testing with an alternate test  methodology [phone]) is advised. The SARS-CoV-2 RNA is generally  detectable in upper and lower respiratory sp ecimens during the acute  phase of infection. The expected result is Negative. Fact Sheet for Patients:  BoilerBrush.com.cy Fact Sheet for Healthcare Providers: https://pope.com/ This test is not yet approved or cleared by the [place] FDA and has been authorized for detection and/or diagnosis of SARS-CoV-2 by FDA under an Emergency Use Authorization (EUA).  This EUA will remain in effect (meaning this test can be used) for the duration of the COVID-19 declaration under Section 564(b)(1) of the Act, 21 U.S.C. section 360bbb-3(b)(1), unless the authorization is terminated or revoked sooner. Performed at [facility], [address], [place], [place] [address]      Labs: BNP (last 3 results) No results for input(s): BNP in the last 8760 hours. Basic Metabolic Panel: Recent Labs  Lab 09/18/18 1451 09/18/18 1840 09/18/18 2147 09/19/18 0225 09/19/18 0657  NA 141 139 140 138 137  K 4.9 5.4* 5.5* 4.6 4.6  CL 106 107 108 107 106  CO2 22 23 21* 23 25  GLUCOSE 224* 181* 185* 232* 267*  BUN 61* 56* 54* 49* 49*  CREATININE 1.69* 1.44* 1.33* 1.28* 1.27*  CALCIUM 9.4  9.1 8.9 8.5* 8.4*   Liver Function Tests: Recent Labs  Lab 09/18/18 0627  AST 16  ALT 20  ALKPHOS 155*  BILITOT 0.9  PROT 8.1  ALBUMIN 4.0   No results for input(s): LIPASE, AMYLASE in the last 168 hours. No results for input(s): AMMONIA in the last 168 hours. CBC: Recent Labs  Lab 09/18/18 0627 09/19/18 0225  WBC 12.9* 11.7*  HGB 13.7 12.1  HCT 42.7 37.9  MCV 89.3 88.3  PLT 356 306   Cardiac Enzymes: Recent Labs  Lab  09/18/18 0627  TROPONINI <0.03   BNP: Invalid input(s): POCBNP CBG: Recent Labs  Lab 09/18/18 1842 09/18/18 1950 09/18/18 2116 09/19/18 0748 09/19/18 1124  GLUCAP 163* 149* 164* 230* 287*   D-Dimer No results for input(s): DDIMER in the last 72 hours. Hgb A1c Recent Labs    09/18/18 0627  HGBA1C 18.3*   Lipid Profile No results for input(s): CHOL, HDL, LDLCALC, TRIG, CHOLHDL, LDLDIRECT in the last 72 hours. Thyroid function studies No results for input(s): TSH, T4TOTAL, T3FREE, THYROIDAB in the last 72 hours.  Invalid input(s): FREET3 Anemia work up No results for input(s): VITAMINB12, FOLATE, FERRITIN, TIBC, IRON, RETICCTPCT in the last 72 hours. Urinalysis    Component Value Date/Time   COLORURINE STRAW (A) 09/18/2018 0818   APPEARANCEUR CLEAR 09/18/2018 0818   LABSPEC 1.026 09/18/2018 0818   PHURINE 6.0 09/18/2018 0818   GLUCOSEU >=500 (A) 09/18/2018 0818   HGBUR NEGATIVE 09/18/2018 0818   BILIRUBINUR NEGATIVE 09/18/2018 0818   KETONESUR 20 (A) 09/18/2018 0818   PROTEINUR 30 (A) 09/18/2018 0818   NITRITE NEGATIVE 09/18/2018 0818   LEUKOCYTESUR NEGATIVE 09/18/2018 0818   Sepsis Labs Invalid input(s): PROCALCITONIN,  WBC,  LACTICIDVEN Microbiology Recent Results (from the past 240 hour(s))  MRSA PCR Screening     Status: None   Collection Time: 09/18/18  1:26 PM   Specimen: Nasopharyngeal  Result Value Ref Range Status   MRSA by PCR NEGATIVE NEGATIVE Final    Comment:        The GeneXpert MRSA Assay (FDA approved for  NASAL specimens only), is one component of a comprehensive MRSA colonization surveillance program. It is not intended to diagnose MRSA infection nor to guide or monitor treatment for MRSA infections. Performed at [facility], [address] [place], [place] [address]   SARS Coronavirus 2 (CEPHEID - Performed in [facility] hospital lab), Hosp Order     Status: None   Collection Time: 09/18/18  6:59 PM   Specimen: Nasopharyngeal Swab  Result Value Ref Range Status   SARS Coronavirus 2 NEGATIVE NEGATIVE Final    Comment: (NOTE) If result is NEGATIVE SARS-CoV-2 target nucleic acids are NOT DETECTED. The SARS-CoV-2 RNA is generally detectable in upper and lower  respiratory specimens during the acute phase of infection. The lowest  concentration of SARS-CoV-2 viral copies this assay can detect is 250  copies / mL. A negative result does not preclude SARS-CoV-2 infection  and should not be used as the sole basis for treatment or other  patient management decisions.  A negative result may occur with  improper specimen collection / handling, submission of specimen other  than nasopharyngeal swab, presence of viral mutation(s) within the  areas targeted by this assay, and inadequate number of viral copies  (<250 copies / mL). A negative result must be combined with clinical  observations, patient history, and epidemiological information. If result is POSITIVE SARS-CoV-2 target nucleic acids are DETECTED. The SARS-CoV-2 RNA is generally detectable in upper and lower  respiratory specimens dur ing the acute phase of infection.  Positive  results are indicative of active infection with SARS-CoV-2.  Clinical  correlation with patient history and other diagnostic information is  necessary to determine patient infection status.  Positive results do  not rule out bacterial infection or co-infection with other viruses. If result is PRESUMPTIVE POSTIVE SARS-CoV-2 nucleic  acids MAY BE PRESENT.   A presumptive positive result was obtained on the submitted specimen  and confirmed on repeat testing.  While 2019 novel coronavirus  (SARS-CoV-2) nucleic acids may be present in the submitted sample  additional confirmatory testing may be necessary for epidemiological  and / or clinical management purposes  to differentiate between  SARS-CoV-2 and other Sarbecovirus currently known to infect humans.  If clinically indicated additional testing with an alternate test  methodology [phone]([id number]) is advised. The SARS-CoV-2 RNA is generally  detectable in upper and lower respiratory sp ecimens during the acute  phase of infection. The expected result is Negative. Fact Sheet for Patients:  BoilerBrush.com.cyhttps://www.fda.gov/media/136312/download Fact Sheet for Healthcare Providers: https://pope.com/https://www.fda.gov/media/136313/download This test is not yet approved or cleared by the [place] FDA and has been authorized for detection and/or diagnosis of SARS-CoV-2 by FDA under an Emergency Use Authorization (EUA).  This EUA will remain in effect (meaning this test can be used) for the duration of the COVID-19 declaration under Section 564(b)(1) of the Act, 21 U.S.C. section 360bbb-3(b)(1), unless the authorization is terminated or revoked sooner. Performed at [facility], [address] [place], [place] [address]      Time coordinating discharge: 39 minutes  SIGNED:   Hughie Clossavi Maude Gloor, MD  [facility] 09/19/2018, 1:12 PM Pager [phone]  If 7PM-7AM, please contact night-coverage [email or web address] Password [email or web address]

## 2018-09-20 LAB — HIV ANTIBODY (ROUTINE TESTING W REFLEX): HIV Screen 4th Generation wRfx: NONREACTIVE

## 2018-09-30 ENCOUNTER — Other Ambulatory Visit (HOSPITAL_COMMUNITY): Payer: Self-pay | Admitting: Nurse Practitioner

## 2018-12-01 ENCOUNTER — Other Ambulatory Visit (HOSPITAL_COMMUNITY): Payer: Self-pay | Admitting: Nurse Practitioner

## 2019-01-31 ENCOUNTER — Other Ambulatory Visit (HOSPITAL_COMMUNITY): Payer: Self-pay | Admitting: *Deleted

## 2019-01-31 MED ORDER — FLECAINIDE ACETATE 100 MG PO TABS: 100.0000 mg | ORAL_TABLET | Freq: Two times a day (BID) | ORAL | 0 refills | Status: DC

## 2019-03-25 ENCOUNTER — Encounter (HOSPITAL_COMMUNITY): Payer: Self-pay

## 2019-03-25 ENCOUNTER — Emergency Department (HOSPITAL_COMMUNITY): Payer: Medicaid Other

## 2019-03-25 ENCOUNTER — Other Ambulatory Visit: Payer: Self-pay

## 2019-03-25 ENCOUNTER — Inpatient Hospital Stay (HOSPITAL_COMMUNITY)
Admission: EM | Admit: 2019-03-25 | Discharge: 2019-04-01 | DRG: 336 | Disposition: A | Discharge status: Home / Self Care | Payer: Medicaid Other | Attending: Surgery | Admitting: Surgery

## 2019-03-25 ENCOUNTER — Inpatient Hospital Stay (HOSPITAL_COMMUNITY): Payer: Medicaid Other

## 2019-03-25 DIAGNOSIS — Z833 Family history of diabetes mellitus: Secondary | ICD-10-CM | POA: Diagnosis not present

## 2019-03-25 DIAGNOSIS — Z6836 Body mass index (BMI) 36.0-36.9, adult: Secondary | ICD-10-CM | POA: Diagnosis not present

## 2019-03-25 DIAGNOSIS — Z841 Family history of disorders of kidney and ureter: Secondary | ICD-10-CM

## 2019-03-25 DIAGNOSIS — K5651 Intestinal adhesions [bands], with partial obstruction: Principal | ICD-10-CM | POA: Diagnosis present

## 2019-03-25 DIAGNOSIS — E1143 Type 2 diabetes mellitus with diabetic autonomic (poly)neuropathy: Secondary | ICD-10-CM | POA: Diagnosis present

## 2019-03-25 DIAGNOSIS — Z7901 Long term (current) use of anticoagulants: Secondary | ICD-10-CM | POA: Diagnosis not present

## 2019-03-25 DIAGNOSIS — N736 Female pelvic peritoneal adhesions (postinfective): Secondary | ICD-10-CM | POA: Diagnosis present

## 2019-03-25 DIAGNOSIS — Z0189 Encounter for other specified special examinations: Secondary | ICD-10-CM

## 2019-03-25 DIAGNOSIS — N179 Acute kidney failure, unspecified: Secondary | ICD-10-CM | POA: Diagnosis present

## 2019-03-25 DIAGNOSIS — R066 Hiccough: Secondary | ICD-10-CM | POA: Diagnosis not present

## 2019-03-25 DIAGNOSIS — I480 Paroxysmal atrial fibrillation: Secondary | ICD-10-CM | POA: Diagnosis present

## 2019-03-25 DIAGNOSIS — Z820 Family history of epilepsy and other diseases of the nervous system: Secondary | ICD-10-CM | POA: Diagnosis not present

## 2019-03-25 DIAGNOSIS — D72829 Elevated white blood cell count, unspecified: Secondary | ICD-10-CM | POA: Diagnosis not present

## 2019-03-25 DIAGNOSIS — E1169 Type 2 diabetes mellitus with other specified complication: Secondary | ICD-10-CM | POA: Insufficient documentation

## 2019-03-25 DIAGNOSIS — I160 Hypertensive urgency: Secondary | ICD-10-CM | POA: Diagnosis present

## 2019-03-25 DIAGNOSIS — E1165 Type 2 diabetes mellitus with hyperglycemia: Secondary | ICD-10-CM | POA: Diagnosis not present

## 2019-03-25 DIAGNOSIS — K660 Peritoneal adhesions (postprocedural) (postinfection): Secondary | ICD-10-CM | POA: Diagnosis present

## 2019-03-25 DIAGNOSIS — E1122 Type 2 diabetes mellitus with diabetic chronic kidney disease: Secondary | ICD-10-CM | POA: Diagnosis present

## 2019-03-25 DIAGNOSIS — K56609 Unspecified intestinal obstruction, unspecified as to partial versus complete obstruction: Secondary | ICD-10-CM | POA: Diagnosis present

## 2019-03-25 DIAGNOSIS — E8770 Fluid overload, unspecified: Secondary | ICD-10-CM | POA: Diagnosis not present

## 2019-03-25 DIAGNOSIS — Z20828 Contact with and (suspected) exposure to other viral communicable diseases: Secondary | ICD-10-CM | POA: Diagnosis present

## 2019-03-25 DIAGNOSIS — Z9049 Acquired absence of other specified parts of digestive tract: Secondary | ICD-10-CM | POA: Diagnosis not present

## 2019-03-25 DIAGNOSIS — K56600 Partial intestinal obstruction, unspecified as to cause: Secondary | ICD-10-CM | POA: Diagnosis present

## 2019-03-25 DIAGNOSIS — N1831 Chronic kidney disease, stage 3a: Secondary | ICD-10-CM | POA: Diagnosis present

## 2019-03-25 DIAGNOSIS — E118 Type 2 diabetes mellitus with unspecified complications: Secondary | ICD-10-CM | POA: Diagnosis not present

## 2019-03-25 DIAGNOSIS — Z7984 Long term (current) use of oral hypoglycemic drugs: Secondary | ICD-10-CM

## 2019-03-25 DIAGNOSIS — N183 Chronic kidney disease, stage 3 unspecified: Secondary | ICD-10-CM

## 2019-03-25 DIAGNOSIS — K219 Gastro-esophageal reflux disease without esophagitis: Secondary | ICD-10-CM | POA: Diagnosis present

## 2019-03-25 DIAGNOSIS — E669 Obesity, unspecified: Secondary | ICD-10-CM | POA: Diagnosis not present

## 2019-03-25 DIAGNOSIS — I129 Hypertensive chronic kidney disease with stage 1 through stage 4 chronic kidney disease, or unspecified chronic kidney disease: Secondary | ICD-10-CM | POA: Diagnosis present

## 2019-03-25 DIAGNOSIS — Z8249 Family history of ischemic heart disease and other diseases of the circulatory system: Secondary | ICD-10-CM

## 2019-03-25 DIAGNOSIS — J45909 Unspecified asthma, uncomplicated: Secondary | ICD-10-CM | POA: Diagnosis present

## 2019-03-25 LAB — URINALYSIS, ROUTINE W REFLEX MICROSCOPIC
Bilirubin Urine: NEGATIVE
Glucose, UA: >=500 mg/dL — AB
Ketones, ur: 5 mg/dL — AB
Leukocytes,Ua: NEGATIVE
Nitrite: NEGATIVE
Protein, ur: 100 mg/dL — AB
Specific Gravity, Urine: 1.025 (ref 1.005–1.030)
pH: 5 (ref 5.0–8.0)

## 2019-03-25 LAB — COMPREHENSIVE METABOLIC PANEL
ALT: 15 U/L (ref 0–44)
AST: 19 U/L (ref 15–41)
Albumin: 4 g/dL (ref 3.5–5.0)
Alkaline Phosphatase: 122 U/L (ref 38–126)
Anion gap: 16 — ABNORMAL HIGH (ref 5–15)
BUN: 23 mg/dL — ABNORMAL HIGH (ref 6–20)
CO2: 20 mmol/L — ABNORMAL LOW (ref 22–32)
Calcium: 9.7 mg/dL (ref 8.9–10.3)
Chloride: 100 mmol/L (ref 98–111)
Creatinine, Ser: 1.19 mg/dL — ABNORMAL HIGH (ref 0.44–1.00)
GFR calc Af Amer: 60 mL/min — ABNORMAL LOW (ref >60)
GFR calc non Af Amer: 52 mL/min — ABNORMAL LOW (ref >60)
Glucose, Bld: 432 mg/dL — ABNORMAL HIGH (ref 70–99)
Potassium: 3.7 mmol/L (ref 3.5–5.1)
Sodium: 136 mmol/L (ref 135–145)
Total Bilirubin: 0.6 mg/dL (ref 0.3–1.2)
Total Protein: 8.2 g/dL — ABNORMAL HIGH (ref 6.5–8.1)

## 2019-03-25 LAB — CBC WITH DIFFERENTIAL/PLATELET
Abs Immature Granulocytes: 0.02 10*3/uL (ref 0.00–0.07)
Basophils Absolute: 0 10*3/uL (ref 0.0–0.1)
Basophils Relative: 0 %
Eosinophils Absolute: 0.2 10*3/uL (ref 0.0–0.5)
Eosinophils Relative: 2 %
HCT: 43.5 % (ref 36.0–46.0)
Hemoglobin: 13.9 g/dL (ref 12.0–15.0)
Immature Granulocytes: 0 %
Lymphocytes Relative: 33 %
Lymphs Abs: 3.1 10*3/uL (ref 0.7–4.0)
MCH: 27.4 pg (ref 26.0–34.0)
MCHC: 32 g/dL (ref 30.0–36.0)
MCV: 85.8 fL (ref 80.0–100.0)
Monocytes Absolute: 0.6 10*3/uL (ref 0.1–1.0)
Monocytes Relative: 6 %
Neutro Abs: 5.6 10*3/uL (ref 1.7–7.7)
Neutrophils Relative %: 59 %
Platelets: 370 10*3/uL (ref 150–400)
RBC: 5.07 MIL/uL (ref 3.87–5.11)
RDW: 12.9 % (ref 11.5–15.5)
WBC: 9.5 10*3/uL (ref 4.0–10.5)
nRBC: 0 % (ref 0.0–0.2)

## 2019-03-25 LAB — LIPASE, BLOOD: Lipase: 19 U/L (ref 11–51)

## 2019-03-25 LAB — CBG MONITORING, ED
Glucose-Capillary: 351 mg/dL — ABNORMAL HIGH (ref 70–99)
Glucose-Capillary: 496 mg/dL — ABNORMAL HIGH (ref 70–99)

## 2019-03-25 MED ORDER — ENOXAPARIN SODIUM 40 MG/0.4ML ~~LOC~~ SOLN
40.0000 mg | Freq: Every day | SUBCUTANEOUS | Status: DC
  Administered 2019-03-26: 40 mg via SUBCUTANEOUS
  Filled 2019-03-25: qty 0.4

## 2019-03-25 MED ORDER — SODIUM CHLORIDE (PF) 0.9 % IJ SOLN
INTRAMUSCULAR | Status: AC
  Filled 2019-03-25: qty 50

## 2019-03-25 MED ORDER — SODIUM CHLORIDE 0.9 % IV BOLUS: 1000.0000 mL | Freq: Once | INTRAVENOUS | Status: DC

## 2019-03-25 MED ORDER — PANTOPRAZOLE SODIUM 40 MG IV SOLR
40.0000 mg | Freq: Every day | INTRAVENOUS | Status: DC
  Administered 2019-03-26: 40 mg via INTRAVENOUS
  Filled 2019-03-25: qty 40

## 2019-03-25 MED ORDER — ONDANSETRON HCL 4 MG/2ML IJ SOLN
4.0000 mg | Freq: Four times a day (QID) | INTRAMUSCULAR | Status: DC | PRN
  Administered 2019-03-26: 4 mg via INTRAVENOUS
  Filled 2019-03-25: qty 2

## 2019-03-25 MED ORDER — INSULIN ASPART 100 UNIT/ML ~~LOC~~ SOLN
0.0000 [IU] | SUBCUTANEOUS | Status: DC
  Filled 2019-03-25: qty 0.15

## 2019-03-25 MED ORDER — HYDROMORPHONE HCL 1 MG/ML IJ SOLN
1.0000 mg | Freq: Once | INTRAMUSCULAR | Status: AC
  Administered 2019-03-25: 1 mg via INTRAVENOUS
  Filled 2019-03-25: qty 1

## 2019-03-25 MED ORDER — DEXTROSE 50 % IV SOLN: 0.0000 mL | INTRAVENOUS | Status: DC | PRN

## 2019-03-25 MED ORDER — DEXTROSE-NACL 5-0.45 % IV SOLN: INTRAVENOUS | Status: DC

## 2019-03-25 MED ORDER — MORPHINE SULFATE (PF) 4 MG/ML IV SOLN
4.0000 mg | Freq: Once | INTRAVENOUS | Status: AC
  Administered 2019-03-25: 4 mg via INTRAVENOUS
  Filled 2019-03-25: qty 1

## 2019-03-25 MED ORDER — SODIUM CHLORIDE 0.9 % IV SOLN: INTRAVENOUS | Status: DC

## 2019-03-25 MED ORDER — DILTIAZEM LOAD VIA INFUSION
10.0000 mg | Freq: Once | INTRAVENOUS | Status: AC
  Administered 2019-03-26: 10 mg via INTRAVENOUS
  Filled 2019-03-25: qty 10

## 2019-03-25 MED ORDER — INSULIN REGULAR(HUMAN) IN NACL 100-0.9 UT/100ML-% IV SOLN
INTRAVENOUS | Status: DC
  Administered 2019-03-26: 15 [IU]/h via INTRAVENOUS
  Administered 2019-03-26: 11 [IU]/h via INTRAVENOUS
  Administered 2019-03-26: 4.6 [IU]/h via INTRAVENOUS
  Filled 2019-03-25 (×2): qty 100

## 2019-03-25 MED ORDER — SODIUM CHLORIDE 0.9 % IV BOLUS (SEPSIS)
1000.0000 mL | Freq: Once | INTRAVENOUS | Status: AC
  Administered 2019-03-26: 1000 mL via INTRAVENOUS

## 2019-03-25 MED ORDER — MORPHINE SULFATE (PF) 2 MG/ML IV SOLN: 1.0000 mg | INTRAVENOUS | Status: DC | PRN

## 2019-03-25 MED ORDER — METOCLOPRAMIDE HCL 5 MG/ML IJ SOLN
10.0000 mg | Freq: Once | INTRAMUSCULAR | Status: AC
  Administered 2019-03-25: 10 mg via INTRAVENOUS
  Filled 2019-03-25: qty 2

## 2019-03-25 MED ORDER — METOPROLOL TARTRATE 5 MG/5ML IV SOLN
5.0000 mg | Freq: Once | INTRAVENOUS | Status: AC
  Administered 2019-03-25: 5 mg via INTRAVENOUS
  Filled 2019-03-25: qty 5

## 2019-03-25 MED ORDER — ONDANSETRON HCL 4 MG/2ML IJ SOLN
4.0000 mg | Freq: Once | INTRAMUSCULAR | Status: AC
  Administered 2019-03-25: 4 mg via INTRAVENOUS
  Filled 2019-03-25: qty 2

## 2019-03-25 MED ORDER — SODIUM CHLORIDE 0.9 % IV SOLN: 1000.0000 mL | INTRAVENOUS | Status: DC

## 2019-03-25 MED ORDER — DIATRIZOATE MEGLUMINE & SODIUM 66-10 % PO SOLN
90.0000 mL | Freq: Once | ORAL | Status: AC
  Administered 2019-03-25: 90 mL via NASOGASTRIC
  Filled 2019-03-25: qty 90

## 2019-03-25 MED ORDER — ONDANSETRON 4 MG PO TBDP: 4.0000 mg | ORAL_TABLET | Freq: Four times a day (QID) | ORAL | Status: DC | PRN

## 2019-03-25 MED ORDER — PROMETHAZINE HCL 25 MG/ML IJ SOLN
25.0000 mg | Freq: Once | INTRAMUSCULAR | Status: AC
  Administered 2019-03-25: 25 mg via INTRAVENOUS
  Filled 2019-03-25: qty 1

## 2019-03-25 MED ORDER — IOHEXOL 300 MG/ML  SOLN
100.0000 mL | Freq: Once | INTRAMUSCULAR | Status: AC | PRN
  Administered 2019-03-25: 100 mL via INTRAVENOUS

## 2019-03-25 MED ORDER — LABETALOL HCL 5 MG/ML IV SOLN
20.0000 mg | Freq: Once | INTRAVENOUS | Status: AC
  Administered 2019-03-25: 20 mg via INTRAVENOUS
  Filled 2019-03-25: qty 4

## 2019-03-25 MED ORDER — POTASSIUM CHLORIDE IN NACL 20-0.9 MEQ/L-% IV SOLN
INTRAVENOUS | Status: DC
  Filled 2019-03-25 (×2): qty 1000

## 2019-03-25 MED ORDER — DILTIAZEM HCL-DEXTROSE 125-5 MG/125ML-% IV SOLN (PREMIX)
5.0000 mg/h | INTRAVENOUS | Status: DC
  Administered 2019-03-26: 5 mg/h via INTRAVENOUS
  Administered 2019-03-26: 7.5 mg/h via INTRAVENOUS
  Administered 2019-03-26: 5 mg/h via INTRAVENOUS
  Administered 2019-03-27: 10 mg/h via INTRAVENOUS
  Filled 2019-03-25 (×4): qty 125

## 2019-03-25 MED ORDER — SODIUM CHLORIDE 0.9 % IV BOLUS
1000.0000 mL | Freq: Once | INTRAVENOUS | Status: AC
  Administered 2019-03-25: 1000 mL via INTRAVENOUS

## 2019-03-25 NOTE — ED Provider Notes (Signed)
  Physical Exam  BP (!) 179/119   Pulse 87   Temp 98.3 F (36.8 C)   Resp 18   SpO2 100%   Physical Exam Constitutional:      General: She is not in acute distress.    Appearance: She is not toxic-appearing or diaphoretic.  Cardiovascular:     Rate and Rhythm: Tachycardia present.  Pulmonary:     Effort: Pulmonary effort is normal.  Abdominal:     General: Abdomen is protuberant.     Tenderness: There is abdominal tenderness.  Skin:    General: Skin is warm.  Neurological:     Mental Status: She is alert.     ED Course/Procedures   Clinical Course as of Mar 25 2319  Sat Mar 25, 2019  1750 Acute epigastric pain, n/v. Loose stool this AM. Pending CT scan. If improved and CT normal she can be d/c.    [KM]  1919 Patient with partial small bowel obstruction on CT scan.  Her pain is improved but she still has significant pain.  Has not vomited in about 3 hours.  Spoke with Dr. Harlow Asa with general surgeon Dr. Armandina Gemma who will come down and admit the patient.  NG tube to be placed.  Toprol was ordered as the patient's blood pressure continues to be significantly elevated to a 6/120 was the last reading.   [KM]  2319 Patient admitted by Surgery for further treatment   [KM]    Clinical Course User Index [KM] Dawn Apley, PA-C    Procedures  MDM  Care assumed from previous PA due to change of shift. See her HPI and note for further details. Currently awaiting cT scan in patient with abdominal pain, n/v since today. Still symptomatic after several doses of nausea and pain medications.  Still hypertensive, likely due to her pain and vomiting.      Dawn Braun 03/25/19 2320    Dawn Dessert, MD 03/25/19 [phone]

## 2019-03-25 NOTE — Consult Note (Addendum)
Medical Consultation   Dawn Braun  ZOX:[id number]  DOB: [birth year]  DOA: 03/25/2019  PCP: Renford Dills, MD   Outpatient Specialists: Rudi Coco NP - Cardiology   Requesting physician: Dr. Gerrit Friends, Gen Surgery  Reason for consultation: Management of DM, HTN and paroxysmal Afib   History of Present Illness: Dawn Braun is an 53 y.o. female with PMH of uncontrolled DM2, paroxysmal Afib and HTN who presented with a partial SBO and is admitted to the Gen Surgery team.  On review, her DM is uncontrolled.  She has hyperglycemia with an AG of 16.  I do not have a pH or blood ketones.  She has small ketones in her urine.  She is either hyperglycemic vs. Mild DKA.  She was very sleepy with me, falling asleep while answering questions (due to pain medications).  She reported abdominal pain in the epigastrium which was 8/10 with palpation, but relatively controlled at rest, sharp and radiating to the back, symptoms started at noon today.  She notes that she took her medications this morning.  She is on 2 oral medications for her DM and diltiazem and flecainide for her Afib.  She cannot afford her eliquis so she does not take it.  She further has HTN documented, but she reports no medications for this.  Her BP was very high in the ED and did not improve much with acute intervention.   Review of Systems:  Review of Systems  Constitutional: Negative for chills and fever.  Respiratory: Negative for cough and shortness of breath.   Cardiovascular: Negative for chest pain, palpitations and leg swelling.  Gastrointestinal: Positive for abdominal pain and nausea.  Skin: Negative for rash.  Neurological: Negative for dizziness and headaches.  As per HPI otherwise 10 point review of systems negative.     Past Medical History: Past Medical History:  Diagnosis Date  . Asthma   . Complication of anesthesia    "takes me awhile to wakeup" (05/17/2017)  . Hypertension     . Intractable nausea and vomiting   . Paroxysmal atrial fibrillation (HCC)   . Type II diabetes mellitus (HCC)     Past Surgical History: Past Surgical History:  Procedure Laterality Date  . CESAREAN SECTION  2003  . CHOLECYSTECTOMY N/A 12/23/2016   Procedure: LAPAROSCOPIC CHOLECYSTECTOMY;  Surgeon: Sheliah Hatch De Blanch, MD;  Location: [facility];  Service: General;  Laterality: N/A;  . EYE SURGERY Right ~ 2016   "bleeding issues; lasered; cauterized leaks"  . KNEE ARTHROSCOPY Right X 2  . SPLIT NIGHT STUDY  08/06/2015  . TUBAL LIGATION  2003     Allergies:  No Known Allergies   Social History: Confirmed with patient.   reports that she has never smoked. She has never used smokeless tobacco. She reports current alcohol use. She reports that she does not use drugs.   Family History: Reviewed with patient.  Family History  Problem Relation Age of Onset  . Epilepsy Father   . Diabetes Mother   . Kidney disease Mother   . Hypertension Mother   . Kidney disease Brother   . Diabetic kidney disease Brother   . Diabetes Brother     Physical Exam: Vitals:   03/25/19 2004 03/25/19 2030 03/25/19 2100 03/25/19 2150  BP: (!) 220/134 (!) 221/126 (!) 160/132 (!) 184/131  Pulse: (!) 106 85 97 89  Resp: 17 17 (!) 21 12  Temp:  TempSrc:      SpO2: 97% 97% 97% 100%    Constitutional: She is drowsy, opens eyes to voice Eyes:  irises appear normal, anicteric sclera,  ENMT: external ears and nose appear normal, NGT in place Neck: neck appears normal CVS: S1-S2 noted, regular, no murmur rubs or gallops, no LE edema, normal pedal pulses, normal radial pulses  Respiratory:  Effort normal, no audible wheezing Abdomen: +BS, tender to palpation in the epigastrium, soft, non distended Musculoskeletal: Normal muscle tone, no contractures or atrophy Neuro: moving all extremities, groggy Skin: no rashes or lesions or ulcers, no induration or nodules  Psych: Normal mood, groggy   Data  reviewed:  I have personally reviewed following labs and imaging studies Labs:  CBC: Recent Labs  Lab 03/25/19 1533  WBC 9.5  NEUTROABS 5.6  HGB 13.9  HCT 43.5  MCV 85.8  PLT 370    Basic Metabolic Panel: Recent Labs  Lab 03/25/19 1533  NA 136  K 3.7  CL 100  CO2 20*  GLUCOSE 432*  BUN 23*  CREATININE 1.19*  CALCIUM 9.7   GFR CrCl cannot be calculated (Unknown ideal weight.). Liver Function Tests: Recent Labs  Lab 03/25/19 1533  AST 19  ALT 15  ALKPHOS 122  BILITOT 0.6  PROT 8.2*  ALBUMIN 4.0   Recent Labs  Lab 03/25/19 1533  LIPASE 19   No results for input(s): AMMONIA in the last 168 hours. Coagulation profile No results for input(s): INR, PROTIME in the last 168 hours.  Cardiac Enzymes: No results for input(s): CKTOTAL, CKMB, CKMBINDEX, TROPONINI in the last 168 hours. BNP: Invalid input(s): POCBNP CBG: Recent Labs  Lab 03/25/19 1522  GLUCAP 351*   D-Dimer No results for input(s): DDIMER in the last 72 hours. Hgb A1c No results for input(s): HGBA1C in the last 72 hours. Lipid Profile No results for input(s): CHOL, HDL, LDLCALC, TRIG, CHOLHDL, LDLDIRECT in the last 72 hours. Thyroid function studies No results for input(s): TSH, T4TOTAL, T3FREE, THYROIDAB in the last 72 hours.  Invalid input(s): FREET3 Anemia work up No results for input(s): VITAMINB12, FOLATE, FERRITIN, TIBC, IRON, RETICCTPCT in the last 72 hours. Urinalysis    Component Value Date/Time   COLORURINE YELLOW 03/25/2019 1533   APPEARANCEUR HAZY (A) 03/25/2019 1533   LABSPEC 1.025 03/25/2019 1533   PHURINE 5.0 03/25/2019 1533   GLUCOSEU >=500 (A) 03/25/2019 1533   HGBUR SMALL (A) 03/25/2019 1533   BILIRUBINUR NEGATIVE 03/25/2019 1533   KETONESUR 5 (A) 03/25/2019 1533   PROTEINUR 100 (A) 03/25/2019 1533   NITRITE NEGATIVE 03/25/2019 1533   LEUKOCYTESUR NEGATIVE 03/25/2019 1533     Microbiology No results found for this or any previous visit (from the past 240  hour(s)).     Inpatient Medications:   Scheduled Meds: . diatrizoate meglumine-sodium  90 mL Per NG tube Once  . enoxaparin (LOVENOX) injection  40 mg Subcutaneous QHS  . insulin aspart  0-15 Units Subcutaneous Q4H  . pantoprazole (PROTONIX) IV  40 mg Intravenous QHS  . sodium chloride (PF)       Continuous Infusions: . sodium chloride     Followed by  . sodium chloride    . 0.9 % NaCl with KCl 20 mEq / L    . sodium chloride       Radiological Exams on Admission: CT Head Wo Contrast  Result Date: 03/25/2019 CLINICAL DATA:  Hypertension, prior contrast earlier EXAM: CT HEAD WITHOUT CONTRAST TECHNIQUE: Contiguous axial images were obtained from  the base of the skull through the vertex without intravenous contrast. COMPARISON:  None. FINDINGS: Brain: No evidence of acute territorial infarction, hemorrhage, hydrocephalus,extra-axial collection or mass lesion/mass effect. Normal gray-white differentiation. Ventricles are normal in size and contour. Vascular: No hyperdense vessel or unexpected calcification. Skull: The skull is intact. No fracture or focal lesion identified. Sinuses/Orbits: The visualized paranasal sinuses and mastoid air cells are clear. The orbits and globes intact. Other: None IMPRESSION: No acute intracranial abnormality. Electronically Signed   By: Jonna Clark M.D.   On: 03/25/2019 18:53   CT ABDOMEN PELVIS W CONTRAST  Result Date: 03/25/2019 CLINICAL DATA:  Nausea vomiting abdominal pain EXAM: CT ABDOMEN AND PELVIS WITH CONTRAST TECHNIQUE: Multidetector CT imaging of the abdomen and pelvis was performed using the standard protocol following bolus administration of intravenous contrast. CONTRAST:  OMNIPAQUE IOHEXOL 300 MG/ML  SOLN COMPARISON:  December 31, 2016 FINDINGS: Lower chest: The visualized heart size within normal limits. No pericardial fluid/thickening. No hiatal hernia. Mild bibasilar dependent atelectasis is noted. Hepatobiliary: The liver is normal  in density without focal abnormality.The main portal vein is patent. The patient is status post cholecystectomy. Pancreas: Unremarkable. No pancreatic ductal dilatation or surrounding inflammatory changes. Spleen: Normal in size without focal abnormality. Adrenals/Urinary Tract: Both adrenal glands appear normal. The kidneys and collecting system appear normal without evidence of urinary tract calculus or hydronephrosis. Bladder is unremarkable. Stomach/Bowel: The stomach is normal in appearance. The proximal small bowel is decompressed. There is a multiple segments of fluid and air-filled mildly dilated small bowel seen within the mid abdomen involving the distal jejunum and proximal ileum measuring up to 3 cm. There is a focal area of narrowing seen within the mid abdomen, series 4, image 47 within the distal ileal loops. There is no definite bowel wall thickening. There is mild clumped appearance seen of the mildly dilated small bowel loops within the mid abdomen.The terminal ileum is normal appearance and decompressed. There is a moderate amount of colonic stool with air and stool to the level of the rectum. The appendix is normal in appearance. Vascular/Lymphatic: There are no enlarged mesenteric, retroperitoneal, or pelvic lymph nodes. A small amount of perihepatic and abdominopelvic ascites is noted. Reproductive: Calcified posterior uterine fibroid is present. Other: No evidence of abdominal wall mass or hernia. Musculoskeletal: No acute or significant osseous findings. IMPRESSION: Findings of partial proximal small-bowel obstruction involving the distal jejunum and proximal ileum. There is a gradual area of narrowing within the mid abdomen in the distal ileal loop. Small amount of abdominopelvic ascites. Electronically Signed   By: Jonna Clark M.D.   On: 03/25/2019 18:40    Impression/Recommendations Small bowel obstruction, partial - NGT in place - Further plan per surgery team  Uncontrolled DM2  with hyperglycemia, possibly mild DKA AGMA - need pH for further deliniation - I am concerned she is in early DKA and will need further monitoring.  Given that she is NPO for her partial SBO, I think she would do best with an insulin drip - VBG ordered, BMET q 4 hours to monitor AG - Fluids per endotool - I have gone ahead and transferred her to the SDU for this elevated need of nursing care - Once gap closed X 2, would transition to long acting insulin.  She may need this on discharge as well.      Paroxysmal atrial fibrillation - She is on diltiazem and flecainide at home - Discussed with pharmacy, recommending diltiazem drip which I have  ordered - This will also help with her BP - There is no IV equivalent for flecainide.  She took her dose this morning, so would attempt to discuss with Cardiology in the AM - Monitor on telemetry - She will need an option for anticoagulation.  She has not been on anything at home.  Further discussion in the AM when she is less groggy.  Continue DVT PPx dosing.   Severe HTN - Monitor blood pressure on diltiazem drip.  Goal would be to decrease BP to < 259 systolic on average - Consider PRN hydralazine if BP remains high      Thank you for this consultation.  Our [facility] hospitalist team will follow the patient with you.   Time Spent: 80 minutes  Gilles Chiquito M.D. [facility] 03/25/2019, 10:14 PM

## 2019-03-25 NOTE — Progress Notes (Signed)
Attempted to give report to 2nd floor ICU/ICU stepdown and they said to call back in 10 mins.

## 2019-03-25 NOTE — ED Triage Notes (Signed)
Pt presents with a sudden onset of abd pain and emesis 2 hours ago. Pt reports her last BM was this am and was a "little runny." Pt states she is unable to pass gas. Pt ponts to epigastric region as to where her pain is located.

## 2019-03-25 NOTE — ED Notes (Signed)
Paged doctor about giving 3L of fluid to this pt due to her pressured being 200s/100s. Metoprolol and labetalol given.

## 2019-03-25 NOTE — ED Notes (Signed)
Patient's oxygen saturation dropped to 86% RA and respirations dropped to 7 after pain medication administration, patient stimulated and 1L [place] applied.

## 2019-03-25 NOTE — ED Provider Notes (Signed)
[facility]-EMERGENCY DEPT Provider Note   CSN: [id number] Arrival date & time: 03/25/19  1507     History Chief Complaint  Patient presents with  . Abdominal Pain  . Emesis    Dawn Braun is a 53 y.o. female presenting for evaluation of abdominal pain, nausea, vomiting.  Patient states approximately 2 hours prior to arrival she had acute onset epigastric abdominal pain.  Since then, she has been up 10-15 times.  Is nonbloody nonbilious.  Patient states pain is constant, does not radiate.  This morning she had one episode of loose stools without blood.  Since then she feels she has not been able to pass gas.  She denies fevers, chills, chest pain, shortness breath, cough, or urinary symptoms.  She has not taken anything for her symptoms since the pain began. She has a history of prior cholecystectomy, no other abdominal surgeries.  Additional history taken from chart review.  Patient has a history of diabetes and paroxysmal A. fib, not on anticoagulation.  HPI     Past Medical History:  Diagnosis Date  . Asthma   . Complication of anesthesia    "takes me awhile to wakeup" (05/17/2017)  . Hypertension   . Intractable nausea and vomiting   . Paroxysmal atrial fibrillation (HCC)   . Type II diabetes mellitus [facility])     Patient Active Problem List   Diagnosis Date Noted  . Acute renal failure superimposed on stage 3 chronic kidney disease (HCC) 09/19/2018  . DKA (diabetic ketoacidoses) (HCC) 09/18/2018  . Leukocytosis 05/17/2017  . Hypokalemia 01/02/2017  . SIRS (systemic inflammatory response syndrome) (HCC) 12/31/2016  . Hemoperitoneum 12/31/2016  . Intractable nausea and vomiting 12/31/2016  . AKI (acute kidney injury) (HCC) 12/31/2016  . Paroxysmal atrial fibrillation (HCC)   . Asthma   . Diabetes mellitus without complication (HCC) 12/23/2016    Past Surgical History:  Procedure Laterality Date  . CESAREAN SECTION  2003  .  CHOLECYSTECTOMY N/A 12/23/2016   Procedure: LAPAROSCOPIC CHOLECYSTECTOMY;  Surgeon: Sheliah Hatch De Blanch, MD;  Location: [facility];  Service: General;  Laterality: N/A;  . EYE SURGERY Right ~ 2016   "bleeding issues; lasered; cauterized leaks"  . KNEE ARTHROSCOPY Right X 2  . SPLIT NIGHT STUDY  08/06/2015  . TUBAL LIGATION  2003     OB History   No obstetric history on file.     Family History  Problem Relation Age of Onset  . Epilepsy Father   . Diabetes Mother   . Kidney disease Mother   . Hypertension Mother   . Kidney disease Brother   . Diabetic kidney disease Brother   . Diabetes Brother     Social History   Tobacco Use  . Smoking status: Never Smoker  . Smokeless tobacco: Never Used  Substance Use Topics  . Alcohol use: Yes    Comment: 05/17/2017 "glass of wine q other weekend"  . Drug use: No    Home Medications Prior to Admission medications   Medication Sig Start Date End Date Taking? Authorizing Provider  apixaban (ELIQUIS) 5 MG TABS tablet Take 1 tablet (5 mg total) by mouth 2 (two) times daily. Patient not taking: Reported on 09/18/2018 07/11/15   Newman Nip, NP  diltiazem (CARDIZEM CD) 120 MG 24 hr capsule Take 1 capsule (120 mg total) by mouth daily. appt required for refills [phone] 12/01/18   Newman Nip, NP  flecainide (TAMBOCOR) 100 MG tablet Take 1 tablet (100 mg  total) by mouth 2 (two) times daily. appt required for refills [phone] 01/31/19   Newman Niparroll, Donna C, NP  glipiZIDE (GLUCOTROL) 5 MG tablet Take 1 tablet (5 mg total) by mouth 2 (two) times daily before a meal for 30 days. 09/19/18 10/19/18  Hughie ClossPahwani, Ravi, MD  ibuprofen (ADVIL) 800 MG tablet Take 800 mg by mouth 3 (three) times daily as needed. 08/01/18   [provider]  metFORMIN (GLUCOPHAGE) 1000 MG tablet Take 1,000 mg by mouth 2 (two) times daily with a meal.    [provider]  metoprolol tartrate (LOPRESSOR) 25 MG tablet Take 1 tablet (25 mg total) by mouth 2  (two) times daily. appt required for refills [id number] 12/01/18   Newman Niparroll, Donna C, NP  predniSONE (STERAPRED UNI-PAK 48 TAB) 10 MG (48) TBPK tablet Take 20-60 mg by mouth See admin instructions. PRED PACK Take 60mg  by mouth daily x4d, take 40mg  by mouth daily x4d, take 20mg  by mouth daily x4d. 09/14/18   [provider]    Allergies    Patient has no known allergies.  Review of Systems   Review of Systems  Gastrointestinal: Positive for abdominal pain, diarrhea, nausea and vomiting.  All other systems reviewed and are negative.   Physical Exam Updated Vital Signs BP (!) 220/108 (BP Location: Right Arm)   Pulse 98   Temp 98.9 F (37.2 C) (Oral)   Resp 12   SpO2 98%   Physical Exam Vitals and nursing note reviewed.  Constitutional:      Appearance: She is well-developed. She is ill-appearing.     Comments: Appears uncomfortable due to pain, multiple episodes of emesis throughout exam  HENT:     Head: Normocephalic and atraumatic.  Eyes:     Conjunctiva/sclera: Conjunctivae normal.     Pupils: Pupils are equal, round, and reactive to light.  Cardiovascular:     Rate and Rhythm: Normal rate and regular rhythm.     Pulses: Normal pulses.  Pulmonary:     Effort: Pulmonary effort is normal. No respiratory distress.     Breath sounds: Normal breath sounds. No wheezing.  Abdominal:     General: There is no distension.     Palpations: Abdomen is soft. There is no mass.     Tenderness: There is abdominal tenderness. There is no guarding or rebound.     Comments: Tenderness palpation of epigastric and suprapubic abdomen.  No rigidity, guarding, distention.  Negative rebound.  Negative Murphy's.  No signs of peritonitis.  Normoactive bowel sounds.  Musculoskeletal:        General: Normal range of motion.     Cervical back: Normal range of motion and neck supple.  Skin:    General: Skin is warm and dry.     Capillary Refill: Capillary refill takes less than 2 seconds.    Neurological:     Mental Status: She is alert and oriented to person, place, and time.     ED Results / Procedures / Treatments   Labs (all labs ordered are listed, but only abnormal results are displayed) Labs Reviewed  CBG MONITORING, ED - Abnormal; Notable for the following components:      Result Value   Glucose-Capillary 351 (*)    All other components within normal limits  CBC WITH DIFFERENTIAL/PLATELET  COMPREHENSIVE METABOLIC PANEL  LIPASE, BLOOD  URINALYSIS, ROUTINE W REFLEX MICROSCOPIC    EKG None  Radiology No results found.  Procedures Procedures (including critical care time)  Medications Ordered in  ED Medications  ondansetron (ZOFRAN) injection 4 mg (4 mg Intravenous Given 03/25/19 1618)  morphine 4 MG/ML injection 4 mg (4 mg Intravenous Given 03/25/19 1619)  sodium chloride 0.9 % bolus 1,000 mL (1,000 mLs Intravenous New Bag/Given 03/25/19 1619)  HYDROmorphone (DILAUDID) injection 1 mg (1 mg Intravenous Given 03/25/19 1655)  metoCLOPramide (REGLAN) injection 10 mg (10 mg Intravenous Given 03/25/19 1655)    ED Course  I have reviewed the triage vital signs and the nursing notes.  Pertinent labs & imaging results that were available during my care of the patient were reviewed by me and considered in my medical decision making (see chart for details).    MDM Rules/Calculators/A&P                      Pt presenting for evaluation of n/v and abd pain.  Physical exam shows patient appears uncomfortable, but otherwise nontoxic.  Epigastric and suprapubic tenderness on abdominal exam.  She does have NABS.  However considering pain, inability to pass gas, and persistent vomiting, concern for obstruction.  Also consider gastroparesis.  Consider DKA.  Consider infection such as UTI.  Will obtain labs, urine, and CT abdomen pelvis for further evaluation.  Of note, patient is very hypertensive.  She does have a history of this, takes metoprolol which he says she had  today.  This may be pain mediated, will try and treat with pain control.  Labs show hyperglycemia.  Creatinine and BUN at baseline.  Bicarb slightly low at 20 and gap slightly elevated at 16, consider early DKA, but doubt full-blown DKA.  This is being treated with fluids at this time.   Reassessment, patient remains with continued pain and vomiting.  She continues to be hypertensive.  Will add on CT head for further evaluation. Case discussed with attending, Dr. Johnney Killian evaluated the pt.   Pt signed out to Coral Else, PA-C for f/u of CT and reassessment of sxs. If improved and CT is normal, consider d/c. If not, pt may need admission.   Final Clinical Impression(s) / ED Diagnoses Final diagnoses:  None    Rx / DC Orders ED Discharge Orders    None       Franchot Heidelberg, PA-C 03/25/19 1749    Charlesetta Shanks, MD 03/26/19 1556

## 2019-03-25 NOTE — H&P (Signed)
Dawn Braun is an 53 y.o. female.    General Surgery [facility]  Chief Complaint: abdominal pain, nausea & emesis, partial SBO  HPI: patient is a 52 yo female who presents to the ER with abdominal pain, nausea and emesis beginning at noon today.  Previous lap chole 2019 by Dr. Fredricka Bonine.  Presented to ER for evaluation.  CT abdomen with dilated loops of small bowel without obvious transition point consistent with partial obstruction.  General surgery asked to evaluate and manage.  Difficult to obtain history in ER.  Patient is sedated and easily falls asleep during interview.  Hx of DM - poorly controlled.  Glucose > 400 today.  Hx of HTN  Hx of Afib - not on anti-coagulation   Past Medical History:  Diagnosis Date  . Asthma   . Complication of anesthesia    "takes me awhile to wakeup" (05/17/2017)  . Hypertension   . Intractable nausea and vomiting   . Paroxysmal atrial fibrillation (HCC)   . Type II diabetes mellitus (HCC)     Past Surgical History:  Procedure Laterality Date  . CESAREAN SECTION  2003  . CHOLECYSTECTOMY N/A 12/23/2016   Procedure: LAPAROSCOPIC CHOLECYSTECTOMY;  Surgeon: Sheliah Hatch De Blanch, MD;  Location: [facility];  Service: General;  Laterality: N/A;  . EYE SURGERY Right ~ 2016   "bleeding issues; lasered; cauterized leaks"  . KNEE ARTHROSCOPY Right X 2  . SPLIT NIGHT STUDY  08/06/2015  . TUBAL LIGATION  2003    Family History  Problem Relation Age of Onset  . Epilepsy Father   . Diabetes Mother   . Kidney disease Mother   . Hypertension Mother   . Kidney disease Brother   . Diabetic kidney disease Brother   . Diabetes Brother    Social History:  reports that she has never smoked. She has never used smokeless tobacco. She reports current alcohol use. She reports that she does not use drugs.  Allergies: No Known Allergies  (Not in a hospital admission)   Results for orders placed or performed during the hospital  encounter of 03/25/19 (from the past 48 hour(s))  CBG monitoring, ED     Status: Abnormal   Collection Time: 03/25/19  3:22 PM  Result Value Ref Range   Glucose-Capillary 351 (H) 70 - 99 mg/dL  CBC with Differential     Status: None   Collection Time: 03/25/19  3:33 PM  Result Value Ref Range   WBC 9.5 4.0 - 10.5 K/uL   RBC 5.07 3.87 - 5.11 MIL/uL   Hemoglobin 13.9 12.0 - 15.0 g/dL   HCT 09.3 26.7 - 12.4 %   MCV 85.8 80.0 - 100.0 fL   MCH 27.4 26.0 - 34.0 pg   MCHC 32.0 30.0 - 36.0 g/dL   RDW 58.0 99.8 - 33.8 %   Platelets 370 150 - 400 K/uL   nRBC 0.0 0.0 - 0.2 %   Neutrophils Relative % 59 %   Neutro Abs 5.6 1.7 - 7.7 K/uL   Lymphocytes Relative 33 %   Lymphs Abs 3.1 0.7 - 4.0 K/uL   Monocytes Relative 6 %   Monocytes Absolute 0.6 0.1 - 1.0 K/uL   Eosinophils Relative 2 %   Eosinophils Absolute 0.2 0.0 - 0.5 K/uL   Basophils Relative 0 %   Basophils Absolute 0.0 0.0 - 0.1 K/uL   Immature Granulocytes 0 %   Abs Immature Granulocytes 0.02 0.00 - 0.07 K/uL  Comment: Performed at [facility], [place] [address] [place], [place] [address]  Comprehensive metabolic panel     Status: Abnormal   Collection Time: 03/25/19  3:33 PM  Result Value Ref Range   Sodium 136 135 - 145 mmol/L   Potassium 3.7 3.5 - 5.1 mmol/L   Chloride 100 98 - 111 mmol/L   CO2 20 (L) 22 - 32 mmol/L   Glucose, Bld 432 (H) 70 - 99 mg/dL   BUN 23 (H) 6 - 20 mg/dL   Creatinine, Ser 1.19 (H) 0.44 - 1.00 mg/dL   Calcium 9.7 8.9 - 10.3 mg/dL   Total Protein 8.2 (H) 6.5 - 8.1 g/dL   Albumin 4.0 3.5 - 5.0 g/dL   AST 19 15 - 41 U/L   ALT 15 0 - 44 U/L   Alkaline Phosphatase 122 38 - 126 U/L   Total Bilirubin 0.6 0.3 - 1.2 mg/dL   GFR calc non Af Amer 52 (L) >60 mL/min   GFR calc Af Amer 60 (L) >60 mL/min   Anion gap 16 (H) 5 - 15    Comment: Performed at [facility], [place] [address] [place], [place] [address]  Lipase, blood     Status: None   Collection Time: 03/25/19   3:33 PM  Result Value Ref Range   Lipase 19 11 - 51 U/L    Comment: Performed at [facility], [place] [address] [place], [place] [address]  Urinalysis, Routine w reflex microscopic     Status: Abnormal   Collection Time: 03/25/19  3:33 PM  Result Value Ref Range   Color, Urine YELLOW YELLOW   APPearance HAZY (A) CLEAR   Specific Gravity, Urine 1.025 1.005 - 1.030   pH 5.0 5.0 - 8.0   Glucose, UA >=500 (A) NEGATIVE mg/dL   Hgb urine dipstick SMALL (A) NEGATIVE   Bilirubin Urine NEGATIVE NEGATIVE   Ketones, ur 5 (A) NEGATIVE mg/dL   Protein, ur 100 (A) NEGATIVE mg/dL   Nitrite NEGATIVE NEGATIVE   Leukocytes,Ua NEGATIVE NEGATIVE   RBC / HPF 0-5 0 - 5 RBC/hpf   WBC, UA 0-5 0 - 5 WBC/hpf   Bacteria, UA RARE (A) NONE SEEN   Squamous Epithelial / LPF 0-5 0 - 5   Mucus PRESENT     Comment: Performed at [facility], [place] [address] [place],  [address]   CT Head Wo Contrast  Result Date: 03/25/2019 CLINICAL DATA:  Hypertension, prior contrast earlier EXAM: CT HEAD WITHOUT CONTRAST TECHNIQUE: Contiguous axial images were obtained from the base of the skull through the vertex without intravenous contrast. COMPARISON:  None. FINDINGS: Brain: No evidence of acute territorial infarction, hemorrhage, hydrocephalus,extra-axial collection or mass lesion/mass effect. Normal gray-white differentiation. Ventricles are normal in size and contour. Vascular: No hyperdense vessel or unexpected calcification. Skull: The skull is intact. No fracture or focal lesion identified. Sinuses/Orbits: The visualized paranasal sinuses and mastoid air cells are clear. The orbits and globes intact. Other: None IMPRESSION: No acute intracranial abnormality. Electronically Signed   By: Prudencio Pair M.D.   On: 03/25/2019 18:53   CT ABDOMEN PELVIS W CONTRAST  Result Date: 03/25/2019 CLINICAL DATA:  Nausea vomiting abdominal pain EXAM: CT ABDOMEN AND PELVIS WITH CONTRAST TECHNIQUE:  Multidetector CT imaging of the abdomen and pelvis was performed using the standard protocol following bolus administration of intravenous contrast. CONTRAST:  146mL OMNIPAQUE IOHEXOL 300 MG/ML  SOLN COMPARISON:  December 31, 2016 FINDINGS: Lower  chest: The visualized heart size within normal limits. No pericardial fluid/thickening. No hiatal hernia. Mild bibasilar dependent atelectasis is noted. Hepatobiliary: The liver is normal in density without focal abnormality.The main portal vein is patent. The patient is status post cholecystectomy. Pancreas: Unremarkable. No pancreatic ductal dilatation or surrounding inflammatory changes. Spleen: Normal in size without focal abnormality. Adrenals/Urinary Tract: Both adrenal glands appear normal. The kidneys and collecting system appear normal without evidence of urinary tract calculus or hydronephrosis. Bladder is unremarkable. Stomach/Bowel: The stomach is normal in appearance. The proximal small bowel is decompressed. There is a multiple segments of fluid and air-filled mildly dilated small bowel seen within the mid abdomen involving the distal jejunum and proximal ileum measuring up to 3 cm. There is a focal area of narrowing seen within the mid abdomen, series 4, image 47 within the distal ileal loops. There is no definite bowel wall thickening. There is mild clumped appearance seen of the mildly dilated small bowel loops within the mid abdomen.The terminal ileum is normal appearance and decompressed. There is a moderate amount of colonic stool with air and stool to the level of the rectum. The appendix is normal in appearance. Vascular/Lymphatic: There are no enlarged mesenteric, retroperitoneal, or pelvic lymph nodes. A small amount of perihepatic and abdominopelvic ascites is noted. Reproductive: Calcified posterior uterine fibroid is present. Other: No evidence of abdominal wall mass or hernia. Musculoskeletal: No acute or significant osseous findings.  IMPRESSION: Findings of partial proximal small-bowel obstruction involving the distal jejunum and proximal ileum. There is a gradual area of narrowing within the mid abdomen in the distal ileal loop. Small amount of abdominopelvic ascites. Electronically Signed   By: Jonna ClarkBindu  Avutu M.D.   On: 03/25/2019 18:40    Review of Systems  Constitutional: Negative.   HENT: Negative.   Eyes: Negative.   Respiratory: Negative.   Cardiovascular: Negative.   Gastrointestinal: Positive for abdominal pain, nausea and vomiting.  Endocrine: Negative.   Genitourinary: Negative.   Musculoskeletal: Negative.   Allergic/Immunologic: Negative.   Neurological: Negative.   Hematological: Negative.   Psychiatric/Behavioral: Negative.     Blood pressure (!) 160/132, pulse 97, temperature 98.9 F (37.2 C), temperature source Oral, resp. rate (!) 21, SpO2 97 %. Physical Exam  Constitutional: She appears well-developed and well-nourished. No distress.  HENT:  Head: Normocephalic and atraumatic.  Mouth/Throat: No oropharyngeal exudate.  Eyes: Pupils are equal, round, and reactive to light. Conjunctivae are normal. No scleral icterus.  Neck: No tracheal deviation present. No thyromegaly present.  Cardiovascular: Normal rate and normal heart sounds.  No murmur heard. Respiratory: Effort normal and breath sounds normal. No respiratory distress. She has no wheezes.  GI: Soft. She exhibits no distension and no mass. There is no abdominal tenderness. There is no rebound and no guarding.  quiet  Musculoskeletal:        General: No deformity or edema. Normal range of motion.     Cervical back: Normal range of motion and neck supple.  Neurological:  Somnolent; arouses to voice; non-focal  Skin: Skin is warm and dry. She is not diaphoretic.  Psychiatric: She has a normal mood and affect. Her behavior is normal.     Assessment/Plan Partial small bowel obstruction  Admit to general surgery service  Initiate small  bowel protocol - ordered  IVF, NPO Uncontrolled DM  Consult to [facility]  SS Insulin ordered HTN Atrial fibrillation Asthma  Will admit to general surgery service for management of partial SBO.  Will consult  [facility] for assistance with DM, Afib, HTN.  Darnell Level, MD [facility] Office: [phone]    Darnell Level, MD 03/25/2019, 9:18 PM

## 2019-03-26 ENCOUNTER — Inpatient Hospital Stay (HOSPITAL_COMMUNITY): Payer: Medicaid Other

## 2019-03-26 ENCOUNTER — Inpatient Hospital Stay (HOSPITAL_COMMUNITY): Payer: Medicaid Other | Admitting: Certified Registered Nurse Anesthetist

## 2019-03-26 ENCOUNTER — Encounter (HOSPITAL_COMMUNITY): Payer: Self-pay

## 2019-03-26 ENCOUNTER — Encounter (HOSPITAL_COMMUNITY): Admission: EM | Disposition: A | Discharge status: Home / Self Care | Payer: Self-pay | Source: Home / Self Care

## 2019-03-26 DIAGNOSIS — K56609 Unspecified intestinal obstruction, unspecified as to partial versus complete obstruction: Secondary | ICD-10-CM

## 2019-03-26 HISTORY — DX: Unspecified intestinal obstruction, unspecified as to partial versus complete obstruction: K56.609

## 2019-03-26 HISTORY — PX: LYSIS OF ADHESION: SHX5961

## 2019-03-26 HISTORY — PX: LAPAROTOMY: SHX154

## 2019-03-26 LAB — SARS CORONAVIRUS 2 (TAT 6-24 HRS): SARS Coronavirus 2: NEGATIVE

## 2019-03-26 LAB — BASIC METABOLIC PANEL
Anion gap: 15 (ref 5–15)
Anion gap: 11 (ref 5–15)
BUN: 26 mg/dL — ABNORMAL HIGH (ref 6–20)
BUN: 32 mg/dL — ABNORMAL HIGH (ref 6–20)
CO2: 18 mmol/L — ABNORMAL LOW (ref 22–32)
CO2: 20 mmol/L — ABNORMAL LOW (ref 22–32)
Calcium: 8.7 mg/dL — ABNORMAL LOW (ref 8.9–10.3)
Calcium: 8.5 mg/dL — ABNORMAL LOW (ref 8.9–10.3)
Chloride: 102 mmol/L (ref 98–111)
Chloride: 113 mmol/L — ABNORMAL HIGH (ref 98–111)
Creatinine, Ser: 1.34 mg/dL — ABNORMAL HIGH (ref 0.44–1.00)
Creatinine, Ser: 1.62 mg/dL — ABNORMAL HIGH (ref 0.44–1.00)
GFR calc Af Amer: 52 mL/min — ABNORMAL LOW (ref >60)
GFR calc Af Amer: 41 mL/min — ABNORMAL LOW (ref >60)
GFR calc non Af Amer: 45 mL/min — ABNORMAL LOW (ref >60)
GFR calc non Af Amer: 36 mL/min — ABNORMAL LOW (ref >60)
Glucose, Bld: 576 mg/dL (ref 70–99)
Glucose, Bld: 188 mg/dL — ABNORMAL HIGH (ref 70–99)
Potassium: 5.2 mmol/L — ABNORMAL HIGH (ref 3.5–5.1)
Potassium: 4.4 mmol/L (ref 3.5–5.1)
Sodium: 135 mmol/L (ref 135–145)
Sodium: 144 mmol/L (ref 135–145)

## 2019-03-26 LAB — CBC
HCT: 44.3 % (ref 36.0–46.0)
Hemoglobin: 14.4 g/dL (ref 12.0–15.0)
MCH: 27.7 pg (ref 26.0–34.0)
MCHC: 32.5 g/dL (ref 30.0–36.0)
MCV: 85.4 fL (ref 80.0–100.0)
Platelets: 286 10*3/uL (ref 150–400)
RBC: 5.19 MIL/uL — ABNORMAL HIGH (ref 3.87–5.11)
RDW: 13.4 % (ref 11.5–15.5)
WBC: 20.8 10*3/uL — ABNORMAL HIGH (ref 4.0–10.5)
nRBC: 0.1 % (ref 0.0–0.2)

## 2019-03-26 LAB — GLUCOSE, CAPILLARY
Glucose-Capillary: 556 mg/dL (ref 70–99)
Glucose-Capillary: 578 mg/dL (ref 70–99)
Glucose-Capillary: 415 mg/dL — ABNORMAL HIGH (ref 70–99)
Glucose-Capillary: 376 mg/dL — ABNORMAL HIGH (ref 70–99)
Glucose-Capillary: 288 mg/dL — ABNORMAL HIGH (ref 70–99)
Glucose-Capillary: 256 mg/dL — ABNORMAL HIGH (ref 70–99)
Glucose-Capillary: 176 mg/dL — ABNORMAL HIGH (ref 70–99)
Glucose-Capillary: 170 mg/dL — ABNORMAL HIGH (ref 70–99)
Glucose-Capillary: 157 mg/dL — ABNORMAL HIGH (ref 70–99)
Glucose-Capillary: 168 mg/dL — ABNORMAL HIGH (ref 70–99)
Glucose-Capillary: 201 mg/dL — ABNORMAL HIGH (ref 70–99)
Glucose-Capillary: 164 mg/dL — ABNORMAL HIGH (ref 70–99)
Glucose-Capillary: 132 mg/dL — ABNORMAL HIGH (ref 70–99)
Glucose-Capillary: 162 mg/dL — ABNORMAL HIGH (ref 70–99)
Glucose-Capillary: 221 mg/dL — ABNORMAL HIGH (ref 70–99)
Glucose-Capillary: 311 mg/dL — ABNORMAL HIGH (ref 70–99)

## 2019-03-26 LAB — POCT I-STAT 7, (LYTES, BLD GAS, ICA,H+H)
Acid-base deficit: 6 mmol/L — ABNORMAL HIGH (ref 0.0–2.0)
Bicarbonate: 19.8 mmol/L — ABNORMAL LOW (ref 20.0–28.0)
Calcium, Ion: 1.2 mmol/L (ref 1.15–1.40)
HCT: 32 % — ABNORMAL LOW (ref 36.0–46.0)
Hemoglobin: 10.9 g/dL — ABNORMAL LOW (ref 12.0–15.0)
O2 Saturation: 100 %
Potassium: 4.1 mmol/L (ref 3.5–5.1)
Sodium: 142 mmol/L (ref 135–145)
TCO2: 21 mmol/L — ABNORMAL LOW (ref 22–32)
pCO2 arterial: 38.4 mmHg (ref 32.0–48.0)
pH, Arterial: 7.321 — ABNORMAL LOW (ref 7.350–7.450)
pO2, Arterial: 265 mmHg — ABNORMAL HIGH (ref 83.0–108.0)

## 2019-03-26 LAB — BLOOD GAS, VENOUS
Acid-base deficit: 6.3 mmol/L — ABNORMAL HIGH (ref 0.0–2.0)
Bicarbonate: 19.7 mmol/L — ABNORMAL LOW (ref 20.0–28.0)
O2 Saturation: 68.1 %
Patient temperature: 98.6
pCO2, Ven: 42.4 mmHg — ABNORMAL LOW (ref 44.0–60.0)
pH, Ven: 7.288 (ref 7.250–7.430)
pO2, Ven: 40.4 mmHg (ref 32.0–45.0)

## 2019-03-26 LAB — CREATININE, SERUM
Creatinine, Ser: 1.58 mg/dL — ABNORMAL HIGH (ref 0.44–1.00)
GFR calc Af Amer: 43 mL/min — ABNORMAL LOW (ref >60)
GFR calc non Af Amer: 37 mL/min — ABNORMAL LOW (ref >60)

## 2019-03-26 LAB — MRSA PCR SCREENING: MRSA by PCR: NEGATIVE

## 2019-03-26 LAB — HEMOGLOBIN A1C
Hgb A1c MFr Bld: 13.6 % — ABNORMAL HIGH (ref 4.8–5.6)
Mean Plasma Glucose: 343.62 mg/dL

## 2019-03-26 SURGERY — LAPAROTOMY, EXPLORATORY
Anesthesia: General | Site: Abdomen

## 2019-03-26 MED ORDER — LACTATED RINGERS IV SOLN: INTRAVENOUS | Status: DC | PRN

## 2019-03-26 MED ORDER — PROPOFOL 10 MG/ML IV BOLUS
INTRAVENOUS | Status: DC | PRN
  Administered 2019-03-26: 170 mg via INTRAVENOUS

## 2019-03-26 MED ORDER — ONDANSETRON 4 MG PO TBDP: 4.0000 mg | ORAL_TABLET | Freq: Four times a day (QID) | ORAL | Status: DC | PRN

## 2019-03-26 MED ORDER — INSULIN GLARGINE 100 UNIT/ML ~~LOC~~ SOLN
10.0000 [IU] | Freq: Every day | SUBCUTANEOUS | Status: DC
  Administered 2019-03-26 – 2019-03-27 (×2): 10 [IU] via SUBCUTANEOUS
  Filled 2019-03-26 (×2): qty 0.1

## 2019-03-26 MED ORDER — CHLORHEXIDINE GLUCONATE CLOTH 2 % EX PADS: 6.0000 | MEDICATED_PAD | Freq: Once | CUTANEOUS | Status: DC

## 2019-03-26 MED ORDER — PANTOPRAZOLE SODIUM 40 MG IV SOLR
40.0000 mg | Freq: Every day | INTRAVENOUS | Status: DC
  Administered 2019-03-26 – 2019-03-28 (×3): 40 mg via INTRAVENOUS
  Filled 2019-03-26 (×3): qty 40

## 2019-03-26 MED ORDER — INSULIN ASPART 100 UNIT/ML ~~LOC~~ SOLN
0.0000 [IU] | SUBCUTANEOUS | Status: DC
  Administered 2019-03-26: 4 [IU] via SUBCUTANEOUS
  Administered 2019-03-26: 2 [IU] via SUBCUTANEOUS
  Administered 2019-03-27 (×3): 4 [IU] via SUBCUTANEOUS
  Administered 2019-03-27 (×2): 3 [IU] via SUBCUTANEOUS
  Administered 2019-03-27 – 2019-03-29 (×6): 2 [IU] via SUBCUTANEOUS
  Administered 2019-03-29: 1 [IU] via SUBCUTANEOUS
  Administered 2019-03-29: 2 [IU] via SUBCUTANEOUS
  Administered 2019-03-29 – 2019-03-30 (×7): 1 [IU] via SUBCUTANEOUS
  Administered 2019-03-31: 3 [IU] via SUBCUTANEOUS
  Administered 2019-03-31 (×2): 1 [IU] via SUBCUTANEOUS
  Administered 2019-03-31: 2 [IU] via SUBCUTANEOUS
  Administered 2019-03-31 – 2019-04-01 (×2): 1 [IU] via SUBCUTANEOUS
  Administered 2019-04-01: 2 [IU] via SUBCUTANEOUS
  Administered 2019-04-01 (×2): 1 [IU] via SUBCUTANEOUS

## 2019-03-26 MED ORDER — MIDAZOLAM HCL 2 MG/2ML IJ SOLN
INTRAMUSCULAR | Status: AC
  Filled 2019-03-26: qty 2

## 2019-03-26 MED ORDER — PHENYLEPHRINE 40 MCG/ML (10ML) SYRINGE FOR IV PUSH (FOR BLOOD PRESSURE SUPPORT)
PREFILLED_SYRINGE | INTRAVENOUS | Status: DC | PRN
  Administered 2019-03-26: 200 ug via INTRAVENOUS
  Administered 2019-03-26: 160 ug via INTRAVENOUS

## 2019-03-26 MED ORDER — INSULIN REGULAR(HUMAN) IN NACL 100-0.9 UT/100ML-% IV SOLN
INTRAVENOUS | Status: DC | PRN
  Administered 2019-03-26: 6.5 [IU]/h via INTRAVENOUS

## 2019-03-26 MED ORDER — POTASSIUM CHLORIDE IN NACL 20-0.45 MEQ/L-% IV SOLN: INTRAVENOUS | Status: DC

## 2019-03-26 MED ORDER — FENTANYL CITRATE (PF) 100 MCG/2ML IJ SOLN
INTRAMUSCULAR | Status: DC | PRN
  Administered 2019-03-26 (×2): 50 ug via INTRAVENOUS

## 2019-03-26 MED ORDER — HYDROMORPHONE HCL 1 MG/ML IJ SOLN
1.0000 mg | INTRAMUSCULAR | Status: DC | PRN
  Administered 2019-03-28 – 2019-03-29 (×3): 2 mg via INTRAVENOUS
  Filled 2019-03-26: qty 2
  Filled 2019-03-26: qty 1
  Filled 2019-03-26 (×2): qty 2

## 2019-03-26 MED ORDER — ACETAMINOPHEN 650 MG RE SUPP: 650.0000 mg | Freq: Four times a day (QID) | RECTAL | Status: DC | PRN

## 2019-03-26 MED ORDER — VASOPRESSIN 20 UNIT/ML IV SOLN
INTRAVENOUS | Status: AC
  Filled 2019-03-26: qty 1

## 2019-03-26 MED ORDER — SODIUM CHLORIDE 0.9 % IV BOLUS
500.0000 mL | Freq: Once | INTRAVENOUS | Status: AC
  Administered 2019-03-26: 500 mL via INTRAVENOUS

## 2019-03-26 MED ORDER — PROMETHAZINE HCL 25 MG/ML IJ SOLN: 6.2500 mg | INTRAMUSCULAR | Status: DC | PRN

## 2019-03-26 MED ORDER — SUGAMMADEX SODIUM 500 MG/5ML IV SOLN
INTRAVENOUS | Status: DC | PRN
  Administered 2019-03-26: 200 mg via INTRAVENOUS

## 2019-03-26 MED ORDER — SODIUM CHLORIDE 0.9 % IV SOLN
1.0000 g | INTRAVENOUS | Status: AC
  Administered 2019-03-26: 1 g via INTRAVENOUS
  Filled 2019-03-26: qty 1

## 2019-03-26 MED ORDER — CHLORHEXIDINE GLUCONATE CLOTH 2 % EX PADS
6.0000 | MEDICATED_PAD | Freq: Every day | CUTANEOUS | Status: DC
  Administered 2019-03-26 – 2019-03-30 (×5): 6 via TOPICAL

## 2019-03-26 MED ORDER — VASOPRESSIN 20 UNIT/ML IV SOLN: INTRAVENOUS | Status: DC | PRN

## 2019-03-26 MED ORDER — MIDAZOLAM HCL 5 MG/5ML IJ SOLN
INTRAMUSCULAR | Status: DC | PRN
  Administered 2019-03-26: 2 mg via INTRAVENOUS

## 2019-03-26 MED ORDER — ONDANSETRON HCL 4 MG/2ML IJ SOLN: 4.0000 mg | Freq: Four times a day (QID) | INTRAMUSCULAR | Status: DC | PRN

## 2019-03-26 MED ORDER — LIDOCAINE 2% (20 MG/ML) 5 ML SYRINGE
INTRAMUSCULAR | Status: DC | PRN
  Administered 2019-03-26: 100 mg via INTRAVENOUS

## 2019-03-26 MED ORDER — DEXAMETHASONE SODIUM PHOSPHATE 4 MG/ML IJ SOLN
INTRAMUSCULAR | Status: DC | PRN
  Administered 2019-03-26: 10 mg via INTRAVENOUS

## 2019-03-26 MED ORDER — ONDANSETRON HCL 4 MG/2ML IJ SOLN
INTRAMUSCULAR | Status: DC | PRN
  Administered 2019-03-26: 4 mg via INTRAVENOUS

## 2019-03-26 MED ORDER — HYDRALAZINE HCL 20 MG/ML IJ SOLN
5.0000 mg | Freq: Four times a day (QID) | INTRAMUSCULAR | Status: DC | PRN
  Administered 2019-03-26 – 2019-03-28 (×7): 5 mg via INTRAVENOUS
  Filled 2019-03-26 (×8): qty 1

## 2019-03-26 MED ORDER — ROCURONIUM BROMIDE 10 MG/ML (PF) SYRINGE
PREFILLED_SYRINGE | INTRAVENOUS | Status: AC
  Filled 2019-03-26: qty 10

## 2019-03-26 MED ORDER — SODIUM CHLORIDE 0.9 % IR SOLN
Status: DC | PRN
  Administered 2019-03-26: 2000 mL

## 2019-03-26 MED ORDER — ENOXAPARIN SODIUM 40 MG/0.4ML ~~LOC~~ SOLN
40.0000 mg | SUBCUTANEOUS | Status: DC
  Administered 2019-03-27 – 2019-03-30 (×4): 40 mg via SUBCUTANEOUS
  Filled 2019-03-26 (×4): qty 0.4

## 2019-03-26 MED ORDER — PROPOFOL 10 MG/ML IV BOLUS
INTRAVENOUS | Status: AC
  Filled 2019-03-26: qty 20

## 2019-03-26 MED ORDER — SUCCINYLCHOLINE CHLORIDE 200 MG/10ML IV SOSY
PREFILLED_SYRINGE | INTRAVENOUS | Status: AC
  Filled 2019-03-26: qty 20

## 2019-03-26 MED ORDER — CLONIDINE HCL 0.1 MG PO TABS
0.2000 mg | ORAL_TABLET | Freq: Once | ORAL | Status: AC
  Administered 2019-03-26: 0.2 mg via ORAL
  Filled 2019-03-26: qty 2

## 2019-03-26 MED ORDER — PHENYLEPHRINE 40 MCG/ML (10ML) SYRINGE FOR IV PUSH (FOR BLOOD PRESSURE SUPPORT)
PREFILLED_SYRINGE | INTRAVENOUS | Status: AC
  Filled 2019-03-26: qty 10

## 2019-03-26 MED ORDER — VASOPRESSIN 20 UNIT/ML IV SOLN
INTRAVENOUS | Status: DC | PRN
  Administered 2019-03-26: 2 [IU] via INTRAVENOUS

## 2019-03-26 MED ORDER — FENTANYL CITRATE (PF) 100 MCG/2ML IJ SOLN
INTRAMUSCULAR | Status: AC
  Filled 2019-03-26: qty 2

## 2019-03-26 MED ORDER — LABETALOL HCL 5 MG/ML IV SOLN
20.0000 mg | Freq: Once | INTRAVENOUS | Status: AC
  Administered 2019-03-26: 20 mg via INTRAVENOUS
  Filled 2019-03-26: qty 4

## 2019-03-26 MED ORDER — KETAMINE HCL 10 MG/ML IJ SOLN
INTRAMUSCULAR | Status: AC
  Filled 2019-03-26: qty 1

## 2019-03-26 MED ORDER — FENTANYL CITRATE (PF) 100 MCG/2ML IJ SOLN
25.0000 ug | INTRAMUSCULAR | Status: DC | PRN
  Administered 2019-03-26: 25 ug via INTRAVENOUS

## 2019-03-26 MED ORDER — ROCURONIUM BROMIDE 50 MG/5ML IV SOSY
PREFILLED_SYRINGE | INTRAVENOUS | Status: DC | PRN
  Administered 2019-03-26: 50 mg via INTRAVENOUS

## 2019-03-26 MED ORDER — ETOMIDATE 2 MG/ML IV SOLN
INTRAVENOUS | Status: AC
  Filled 2019-03-26: qty 10

## 2019-03-26 MED ORDER — PHENYLEPHRINE HCL (PRESSORS) 10 MG/ML IV SOLN
INTRAVENOUS | Status: DC | PRN
  Administered 2019-03-26 (×3): 200 ug via INTRAVENOUS

## 2019-03-26 MED ORDER — SODIUM CHLORIDE 0.9 % IV SOLN: INTRAVENOUS | Status: DC

## 2019-03-26 MED ORDER — ACETAMINOPHEN 325 MG PO TABS: 650.0000 mg | ORAL_TABLET | Freq: Four times a day (QID) | ORAL | Status: DC | PRN

## 2019-03-26 MED ORDER — FENTANYL CITRATE (PF) 250 MCG/5ML IJ SOLN
INTRAMUSCULAR | Status: AC
  Filled 2019-03-26: qty 5

## 2019-03-26 MED ORDER — SUCCINYLCHOLINE CHLORIDE 200 MG/10ML IV SOSY
PREFILLED_SYRINGE | INTRAVENOUS | Status: DC | PRN
  Administered 2019-03-26: 120 mg via INTRAVENOUS

## 2019-03-26 SURGICAL SUPPLY — 42 items
APL PRP STRL LF DISP 70% ISPRP (MISCELLANEOUS) ×2
APL SWBSTK 6 STRL LF DISP (MISCELLANEOUS) ×2
APPLICATOR COTTON TIP 6 STRL (MISCELLANEOUS) ×2 IMPLANT
APPLICATOR COTTON TIP 6IN STRL (MISCELLANEOUS) ×4 IMPLANT
BLADE EXTENDED COATED 6.5IN (ELECTRODE) IMPLANT
BLADE HEX COATED 2.75 (ELECTRODE) ×4 IMPLANT
CHLORAPREP W/TINT 26 (MISCELLANEOUS) ×4 IMPLANT
COVER MAYO STAND STRL (DRAPES) ×3 IMPLANT
COVER SURGICAL LIGHT HANDLE (MISCELLANEOUS) ×4 IMPLANT
COVER WAND RF STERILE (DRAPES) IMPLANT
DRAPE LAPAROSCOPIC ABDOMINAL (DRAPES) ×4 IMPLANT
DRAPE WARM FLUID 44X44 (DRAPES) ×3 IMPLANT
DRSG OPSITE POSTOP 4X6 (GAUZE/BANDAGES/DRESSINGS) ×3 IMPLANT
ELECT REM PT RETURN 15FT ADLT (MISCELLANEOUS) ×4 IMPLANT
GAUZE SPONGE 4X4 12PLY STRL (GAUZE/BANDAGES/DRESSINGS) ×4 IMPLANT
GLOVE BIOGEL PI IND STRL 7.0 (GLOVE) ×2 IMPLANT
GLOVE BIOGEL PI INDICATOR 7.0 (GLOVE) ×2
GLOVE SURG ORTHO 8.0 STRL STRW (GLOVE) ×4 IMPLANT
GOWN STRL REUS W/TWL LRG LVL3 (GOWN DISPOSABLE) ×4 IMPLANT
GOWN STRL REUS W/TWL XL LVL3 (GOWN DISPOSABLE) ×8 IMPLANT
HANDLE SUCTION POOLE (INSTRUMENTS) ×1 IMPLANT
KIT BASIN OR (CUSTOM PROCEDURE TRAY) ×4 IMPLANT
KIT TURNOVER KIT A (KITS) ×3 IMPLANT
NS IRRIG 1000ML POUR BTL (IV SOLUTION) ×4 IMPLANT
PACK GENERAL/GYN (CUSTOM PROCEDURE TRAY) ×4 IMPLANT
PENCIL SMOKE EVACUATOR (MISCELLANEOUS) IMPLANT
SPONGE LAP 18X18 RF (DISPOSABLE) IMPLANT
STAPLER VISISTAT 35W (STAPLE) ×4 IMPLANT
SUCTION POOLE HANDLE (INSTRUMENTS) ×4
SUT NOV 1 T60/GS (SUTURE) IMPLANT
SUT NOVA NAB DX-16 0-1 5-0 T12 (SUTURE) ×12 IMPLANT
SUT SILK 2 0 (SUTURE)
SUT SILK 2 0 SH CR/8 (SUTURE) IMPLANT
SUT SILK 2-0 18XBRD TIE 12 (SUTURE) IMPLANT
SUT SILK 3 0 (SUTURE)
SUT SILK 3 0 SH CR/8 (SUTURE) IMPLANT
SUT SILK 3-0 18XBRD TIE 12 (SUTURE) IMPLANT
SUT VICRYL 2 0 18  UND BR (SUTURE)
SUT VICRYL 2 0 18 UND BR (SUTURE) IMPLANT
TOWEL OR 17X26 10 PK STRL BLUE (TOWEL DISPOSABLE) ×5 IMPLANT
TRAY FOLEY MTR SLVR 14FR STAT (SET/KITS/TRAYS/PACK) ×3 IMPLANT
YANKAUER SUCT BULB TIP NO VENT (SUCTIONS) ×3 IMPLANT

## 2019-03-26 NOTE — Progress Notes (Signed)
CRITICAL VALUE ALERT  Critical Value:  CBG 576  Date & Time Notied:  03/26/19 0230  Provider Notified: [facility]  Orders Received/Actions taken: waiting for further orders. Pt already on insulin drip. Will continue to monitor

## 2019-03-26 NOTE — Progress Notes (Signed)
Assessment & Plan: HD#2 - Partial small bowel obstruction             NG decompression - thin bilious output  AXR with unremarkable bowel gas pattern, contrast still in stomach - ?gastroparesis  Nursing reports "bloody bowel movement" overnight - will check guiac             IVF, NPO  Will repeat AXR this PM - continue NG tube for now Uncontrolled DM, impending DKA             Appreciate Dr. Richardean Canal assistance and management  CBG much improved this AM on insulin drip HTN, Atrial fibrillation  On cardizem drip with rate controlled  Per hospitalist        Armandina Gemma, MD       [facility]       Office: [phone]   Chief Complaint: Partial small bowel obstruction, abdominal pain  Subjective: Patient in ICU, sleeping.  Awakens to voice.  Complains of upper abdominal pain.  Wants to eat.  Objective: Vital signs in last 24 hours: Temp:  [98.1 F (36.7 C)-98.9 F (37.2 C)] 98.1 F (36.7 C) (12/20 0054) Pulse Rate:  [85-124] 101 (12/20 0700) Resp:  [12-29] 20 (12/20 0700) BP: (140-229)/(76-187) 160/82 (12/20 0700) SpO2:  [82 %-100 %] 97 % (12/20 0700) Weight:  [91.5 kg] 91.5 kg (12/20 0054) Last BM Date: 03/24/19  Intake/Output from previous day: 12/19 0701 - 12/20 0700 In: 1259.9 [I.V.:777.2; IV Piggyback:482.6] Out: 151 [Emesis/NG output:150; Stool:1] Intake/Output this shift: No intake/output data recorded.  Physical Exam: HEENT - sclerae clear, mucous membranes moist Neck - soft Chest - clear bilaterally Cor - RRR Abdomen - soft without distension; rare BS present; mild diffuse tenderness, mainly upper abdomen Ext - no edema, non-tender  Lab Results:  Recent Labs    03/25/19 1533  WBC 9.5  HGB 13.9  HCT 43.5  PLT 370   BMET Recent Labs    03/25/19 1533 03/26/19 0004  NA 136 135  K 3.7 5.2*  CL 100 102  CO2 20* 18*  GLUCOSE 432* 576*  BUN 23* 26*  CREATININE 1.19* 1.34*  CALCIUM 9.7 8.7*   PT/INR No results for  input(s): LABPROT, INR in the last 72 hours. Comprehensive Metabolic Panel:    Component Value Date/Time   NA 135 03/26/2019 0004   NA 136 03/25/2019 1533   K 5.2 (H) 03/26/2019 0004   K 3.7 03/25/2019 1533   CL 102 03/26/2019 0004   CL 100 03/25/2019 1533   CO2 18 (L) 03/26/2019 0004   CO2 20 (L) 03/25/2019 1533   BUN 26 (H) 03/26/2019 0004   BUN 23 (H) 03/25/2019 1533   CREATININE 1.34 (H) 03/26/2019 0004   CREATININE 1.19 (H) 03/25/2019 1533   GLUCOSE 576 (HH) 03/26/2019 0004   GLUCOSE 432 (H) 03/25/2019 1533   CALCIUM 8.7 (L) 03/26/2019 0004   CALCIUM 9.7 03/25/2019 1533   AST 19 03/25/2019 1533   AST 16 09/18/2018 0627   ALT 15 03/25/2019 1533   ALT 20 09/18/2018 0627   ALKPHOS 122 03/25/2019 1533   ALKPHOS 155 (H) 09/18/2018 0627   BILITOT 0.6 03/25/2019 1533   BILITOT 0.9 09/18/2018 0627   PROT 8.2 (H) 03/25/2019 1533   PROT 8.1 09/18/2018 0627   ALBUMIN 4.0 03/25/2019 1533   ALBUMIN 4.0 09/18/2018 8657    Studies/Results: CT Head Wo Contrast  Result Date: 03/25/2019 CLINICAL DATA:  Hypertension, prior contrast earlier  EXAM: CT HEAD WITHOUT CONTRAST TECHNIQUE: Contiguous axial images were obtained from the base of the skull through the vertex without intravenous contrast. COMPARISON:  None. FINDINGS: Brain: No evidence of acute territorial infarction, hemorrhage, hydrocephalus,extra-axial collection or mass lesion/mass effect. Normal gray-white differentiation. Ventricles are normal in size and contour. Vascular: No hyperdense vessel or unexpected calcification. Skull: The skull is intact. No fracture or focal lesion identified. Sinuses/Orbits: The visualized paranasal sinuses and mastoid air cells are clear. The orbits and globes intact. Other: None IMPRESSION: No acute intracranial abnormality. Electronically Signed   By: Jonna ClarkBindu  Avutu M.D.   On: 03/25/2019 18:53   CT ABDOMEN PELVIS W CONTRAST  Result Date: 03/25/2019 CLINICAL DATA:  Nausea vomiting abdominal pain  EXAM: CT ABDOMEN AND PELVIS WITH CONTRAST TECHNIQUE: Multidetector CT imaging of the abdomen and pelvis was performed using the standard protocol following bolus administration of intravenous contrast. CONTRAST:  100mL OMNIPAQUE IOHEXOL 300 MG/ML  SOLN COMPARISON:  December 31, 2016 FINDINGS: Lower chest: The visualized heart size within normal limits. No pericardial fluid/thickening. No hiatal hernia. Mild bibasilar dependent atelectasis is noted. Hepatobiliary: The liver is normal in density without focal abnormality.The main portal vein is patent. The patient is status post cholecystectomy. Pancreas: Unremarkable. No pancreatic ductal dilatation or surrounding inflammatory changes. Spleen: Normal in size without focal abnormality. Adrenals/Urinary Tract: Both adrenal glands appear normal. The kidneys and collecting system appear normal without evidence of urinary tract calculus or hydronephrosis. Bladder is unremarkable. Stomach/Bowel: The stomach is normal in appearance. The proximal small bowel is decompressed. There is a multiple segments of fluid and air-filled mildly dilated small bowel seen within the mid abdomen involving the distal jejunum and proximal ileum measuring up to 3 cm. There is a focal area of narrowing seen within the mid abdomen, series 4, image 47 within the distal ileal loops. There is no definite bowel wall thickening. There is mild clumped appearance seen of the mildly dilated small bowel loops within the mid abdomen.The terminal ileum is normal appearance and decompressed. There is a moderate amount of colonic stool with air and stool to the level of the rectum. The appendix is normal in appearance. Vascular/Lymphatic: There are no enlarged mesenteric, retroperitoneal, or pelvic lymph nodes. A small amount of perihepatic and abdominopelvic ascites is noted. Reproductive: Calcified posterior uterine fibroid is present. Other: No evidence of abdominal wall mass or hernia. Musculoskeletal:  No acute or significant osseous findings. IMPRESSION: Findings of partial proximal small-bowel obstruction involving the distal jejunum and proximal ileum. There is a gradual area of narrowing within the mid abdomen in the distal ileal loop. Small amount of abdominopelvic ascites. Electronically Signed   By: Jonna ClarkBindu  Avutu M.D.   On: 03/25/2019 18:40   DG Abd Portable 1V-Small Bowel Obstruction Protocol-initial, 8 hr delay  Result Date: 03/26/2019 CLINICAL DATA:  Small bowel obstruction. EXAM: PORTABLE ABDOMEN - 1 VIEW COMPARISON:  Abdominal x-ray and CT abdomen pelvis from yesterday. FINDINGS: Enteric tube tip in the distal stomach. Oral contrast within the gastric fundus. Paucity of small bowel gas. Air and stool within the colon. No radio-opaque calculi or other significant radiographic abnormality are seen. Excreted contrast within the bladder. Calcified uterine fibroids. No acute osseous abnormality. IMPRESSION: 1. Oral contrast remains within the gastric fundus and has not emptied into the small bowel after 8 hours. This could be related to gastroparesis. 2. Nonspecific bowel gas pattern with paucity of small bowel gas. Electronically Signed   By: Obie DredgeWilliam T Derry M.D.   On: 03/26/2019  07:55   DG Abd Portable 1V-Small Bowel Protocol-Position Verification  Result Date: 03/25/2019 CLINICAL DATA:  NG tube placement EXAM: PORTABLE ABDOMEN - 1 VIEW COMPARISON:  None. FINDINGS: Tip of the NG tube is seen projecting into the proximal stomach. Visualized bowel gas is unremarkable. IMPRESSION: Tip the NG tube in the proximal stomach. Electronically Signed   By: Jonna Clark M.D.   On: 03/25/2019 22:26      Darnell Level 03/26/2019  Patient ID: Dawn Braun, female   DOB: 1965-12-04, 53 y.o.   MRN: [id number]

## 2019-03-26 NOTE — Progress Notes (Addendum)
PROGRESS NOTE    LACOSTA HARGAN  ZOX:[id number] DOB: 08-30-1965 DOA: 03/25/2019 PCP: Renford Dills, MD  Outpatient Specialists:   Brief Narrative:  Patient is a 53 year old African-American female, morbidly obese, with past medical history significant for diabetes mellitus type 2, paroxysmal atrial fibrillation and hypertension.  Patient was admitted with partial small bowel obstruction and accelerated hypertension.  Medical team was consulted to assist with blood sugar and blood pressure management.  Apparently, patient was in mild DKA.  Patient was started on insulin drip following medical consultation.  DKA has resolved.  With start patient on transition protocol.  From my understanding, the surgical team plans to proceed with surgery for small bowel obstruction.  Patient was seen alongside patient's nurse.  No new complaints.  Patient continues to report epigastric pain.  Patient has NG tube to low suction with significant output.  No other constitutional symptoms reported.  Assessment & Plan:   Principal Problem:   Small bowel obstruction, partial (HCC) Active Problems:   Diabetes mellitus type 2, uncontrolled, with complications (HCC)   Paroxysmal atrial fibrillation (HCC)   Partial small bowel obstruction (HCC)   Diabetes mellitus type 2, uncontrolled with mild DKA: -DKA has resolved. -We will discontinue insulin drip -Start transition orders sent -Continue to monitor blood sugar closely. -Hemoglobin A1c done on 03/26/2019 was 13.6%.  A1c was 18.3% on 09/18/2018. -Consider consulting diabetes educator. -Further management will depend on hospital course. -Continue to monitor renal function, volume status and electrolytes closely. -Meds prior to admission was Metformin 1000 mg orally twice daily and glipizide 5 Mg p.o. twice daily.  May consider introducing subcutaneous Lantus on discharge.  Hypertensive urgency: -Blood pressure peaked at 229/127 mmHg. -Last blood  pressure documented this morning was 161/86 mmHg -Continue to optimize blood pressure control. -Goal blood pressure for this patient should be less than 130/80 mmHg  Acute kidney injury on CKD 3A: -Suspect acute kidney injury is volume related -Continue with aggressive hydration -Continue to monitor renal function and electrolytes closely.  Paroxysmal atrial fibrillation: -Patient was on apixaban, Cardizem, flecainide and metoprolol prior to presentation. -Continue Cardizem drip. -If needed, consider IV Lopressor. -Restart anticoagulation when cleared for surgery by the surgical team  Moderate obesity: Further management on discharge.  Epigastric tenderness: -Likely related to vomiting -Continue PPI -Patient has NG tube to low suction -Further management will depend on hospital course  Small bowel obstruction/partial small bowel obstruction: We will defer to the surgical team.  DVT prophylaxis: Apixaban is currently on hold Code Status: Full code Family Communication:  Disposition Plan: Will depend on hospital course  Procedures:   Possible surgery plans for small bowel obstruction  Antimicrobials:   None   Subjective: Epigastric pain Vomiting  Objective: Vitals:   03/26/19 0500 03/26/19 0600 03/26/19 0700 03/26/19 0800  BP: (!) 176/86 (!) 148/76 (!) 160/82 (!) 161/86  Pulse: (!) 103 99 (!) 101 99  Resp: 18 (!) Temp:    98.4 F (36.9 C)  TempSrc:    Oral  SpO2: 97% 96% 97% 96%  Weight:      Height:        Intake/Output Summary (Last 24 hours) at 03/26/2019 1058 Last data filed at 03/26/2019 1015 Gross per 24 hour  Intake 1941.48 ml  Output 151 ml  Net 1790.48 ml   Filed Weights   03/26/19 0054  Weight: 91.5 kg    Examination:  General exam: Appears calm and comfortable.  Moderately obese.  Patient has NG  tube to the low suction Respiratory system: Clear to auscultation. Respiratory effort normal. Cardiovascular system: S1 & S2  heard Gastrointestinal system: Abdomen is obese, epigastric tenderness.  Organs are difficult to assess.   Central nervous system: Alert and oriented.  Patient moves all extremities.   Extremities: No leg edema  Data Reviewed: I have personally reviewed following labs and imaging studies  CBC: Recent Labs  Lab 03/25/19 1533  WBC 9.5  NEUTROABS 5.6  HGB 13.9  HCT 43.5  MCV 85.8  PLT 370   Basic Metabolic Panel: Recent Labs  Lab 03/25/19 1533 03/26/19 0004 03/26/19 0844  NA 136 135 144  K 3.7 5.2* 4.4  CL 100 102 113*  CO2 20* 18* 20*  GLUCOSE 432* 576* 188*  BUN 23* 26* 32*  CREATININE 1.19* 1.34* 1.62*  CALCIUM 9.7 8.7* 8.5*   GFR: Estimated Creatinine Clearance: 41.8 mL/min (A) (by C-G formula based on SCr of 1.62 mg/dL (H)). Liver Function Tests: Recent Labs  Lab 03/25/19 1533  AST 19  ALT 15  ALKPHOS 122  BILITOT 0.6  PROT 8.2*  ALBUMIN 4.0   Recent Labs  Lab 03/25/19 1533  LIPASE 19   No results for input(s): AMMONIA in the last 168 hours. Coagulation Profile: No results for input(s): INR, PROTIME in the last 168 hours. Cardiac Enzymes: No results for input(s): CKTOTAL, CKMB, CKMBINDEX, TROPONINI in the last 168 hours. BNP (last 3 results) No results for input(s): PROBNP in the last 8760 hours. HbA1C: Recent Labs    03/26/19 0004  HGBA1C 13.6*   CBG: Recent Labs  Lab 03/26/19 0619 03/26/19 0732 03/26/19 0832 03/26/19 0936 03/26/19 1037  GLUCAP 256* 176* 170* 157* 168*   Lipid Profile: No results for input(s): CHOL, HDL, LDLCALC, TRIG, CHOLHDL, LDLDIRECT in the last 72 hours. Thyroid Function Tests: No results for input(s): TSH, T4TOTAL, FREET4, T3FREE, THYROIDAB in the last 72 hours. Anemia Panel: No results for input(s): VITAMINB12, FOLATE, FERRITIN, TIBC, IRON, RETICCTPCT in the last 72 hours. Urine analysis:    Component Value Date/Time   COLORURINE YELLOW 03/25/2019 1533   APPEARANCEUR HAZY (A) 03/25/2019 1533   LABSPEC  1.025 03/25/2019 1533   PHURINE 5.0 03/25/2019 1533   GLUCOSEU >=500 (A) 03/25/2019 1533   HGBUR SMALL (A) 03/25/2019 1533   BILIRUBINUR NEGATIVE 03/25/2019 1533   KETONESUR 5 (A) 03/25/2019 1533   PROTEINUR 100 (A) 03/25/2019 1533   NITRITE NEGATIVE 03/25/2019 1533   LEUKOCYTESUR NEGATIVE 03/25/2019 1533   Sepsis Labs: @LABRCNTIP (procalcitonin:4,lacticidven:4)  ) Recent Results (from the past 240 hour(s))  SARS CORONAVIRUS 2 (TAT 6-24 HRS) Nasopharyngeal Nasopharyngeal Swab     Status: None   Collection Time: 03/25/19  8:02 PM   Specimen: Nasopharyngeal Swab  Result Value Ref Range Status   SARS Coronavirus 2 NEGATIVE NEGATIVE Final    Comment: (NOTE) SARS-CoV-2 target nucleic acids are NOT DETECTED. The SARS-CoV-2 RNA is generally detectable in upper and lower respiratory specimens during the acute phase of infection. Negative results do not preclude SARS-CoV-2 infection, do not rule out co-infections with other pathogens, and should not be used as the sole basis for treatment or other patient management decisions. Negative results must be combined with clinical observations, patient history, and epidemiological information. The expected result is Negative. Fact Sheet for Patients: 03/27/19 Fact Sheet for Healthcare Providers: HairSlick.no This test is not yet approved or cleared by the quierodirigir.com FDA and  has been authorized for detection and/or diagnosis of SARS-CoV-2 by FDA under an Emergency  Use Authorization (EUA). This EUA will remain  in effect (meaning this test can be used) for the duration of the COVID-19 declaration under Section 56 4(b)(1) of the Act, 21 U.S.C. section 360bbb-3(b)(1), unless the authorization is terminated or revoked sooner. Performed at [facility], [place] [address], [place], [place] [address]   MRSA PCR Screening     Status: None   Collection Time: 03/26/19  1:29 AM    Specimen: Nasopharyngeal  Result Value Ref Range Status   MRSA by PCR NEGATIVE NEGATIVE Final    Comment:        The GeneXpert MRSA Assay (FDA approved for NASAL specimens only), is one component of a comprehensive MRSA colonization surveillance program. It is not intended to diagnose MRSA infection nor to guide or monitor treatment for MRSA infections. Performed at [facility], [place] [address] [place], [place] [address]          Radiology Studies: CT Head Wo Contrast  Result Date: 03/25/2019 CLINICAL DATA:  Hypertension, prior contrast earlier EXAM: CT HEAD WITHOUT CONTRAST TECHNIQUE: Contiguous axial images were obtained from the base of the skull through the vertex without intravenous contrast. COMPARISON:  None. FINDINGS: Brain: No evidence of acute territorial infarction, hemorrhage, hydrocephalus,extra-axial collection or mass lesion/mass effect. Normal gray-white differentiation. Ventricles are normal in size and contour. Vascular: No hyperdense vessel or unexpected calcification. Skull: The skull is intact. No fracture or focal lesion identified. Sinuses/Orbits: The visualized paranasal sinuses and mastoid air cells are clear. The orbits and globes intact. Other: None IMPRESSION: No acute intracranial abnormality. Electronically Signed   By: Prudencio Pair M.D.   On: 03/25/2019 18:53   CT ABDOMEN PELVIS W CONTRAST  Addendum Date: 03/26/2019   ADDENDUM REPORT: 03/26/2019 08:47 ADDENDUM: Upon further review, appearance is concerning for closed loop small bowel obstruction, with two transition points seen close to each other at the possible site of an internal hernia (series 4, image 45). These results were called by telephone at the time of interpretation on 03/26/2019 at 8:30 am to provider [facility], who verbally acknowledged these results. Electronically Signed   By: Titus Dubin M.D.   On: 03/26/2019 08:47   Result Date: 03/26/2019 CLINICAL DATA:   Nausea vomiting abdominal pain EXAM: CT ABDOMEN AND PELVIS WITH CONTRAST TECHNIQUE: Multidetector CT imaging of the abdomen and pelvis was performed using the standard protocol following bolus administration of intravenous contrast. CONTRAST:  142mL OMNIPAQUE IOHEXOL 300 MG/ML  SOLN COMPARISON:  December 31, 2016 FINDINGS: Lower chest: The visualized heart size within normal limits. No pericardial fluid/thickening. No hiatal hernia. Mild bibasilar dependent atelectasis is noted. Hepatobiliary: The liver is normal in density without focal abnormality.The main portal vein is patent. The patient is status post cholecystectomy. Pancreas: Unremarkable. No pancreatic ductal dilatation or surrounding inflammatory changes. Spleen: Normal in size without focal abnormality. Adrenals/Urinary Tract: Both adrenal glands appear normal. The kidneys and collecting system appear normal without evidence of urinary tract calculus or hydronephrosis. Bladder is unremarkable. Stomach/Bowel: The stomach is normal in appearance. The proximal small bowel is decompressed. There is a multiple segments of fluid and air-filled mildly dilated small bowel seen within the mid abdomen involving the distal jejunum and proximal ileum measuring up to 3 cm. There is a focal area of narrowing seen within the mid abdomen, series 4, image 47 within the distal ileal loops. There is no definite bowel wall thickening. There is mild clumped appearance seen of the mildly dilated small bowel loops  within the mid abdomen.The terminal ileum is normal appearance and decompressed. There is a moderate amount of colonic stool with air and stool to the level of the rectum. The appendix is normal in appearance. Vascular/Lymphatic: There are no enlarged mesenteric, retroperitoneal, or pelvic lymph nodes. A small amount of perihepatic and abdominopelvic ascites is noted. Reproductive: Calcified posterior uterine fibroid is present. Other: No evidence of abdominal wall  mass or hernia. Musculoskeletal: No acute or significant osseous findings. IMPRESSION: Findings of partial proximal small-bowel obstruction involving the distal jejunum and proximal ileum. There is a gradual area of narrowing within the mid abdomen in the distal ileal loop. Small amount of abdominopelvic ascites. Electronically Signed: By: Jonna ClarkBindu  Avutu M.D. On: 03/25/2019 18:40   DG Abd Portable 1V-Small Bowel Obstruction Protocol-initial, 8 hr delay  Result Date: 03/26/2019 CLINICAL DATA:  Small bowel obstruction. EXAM: PORTABLE ABDOMEN - 1 VIEW COMPARISON:  Abdominal x-ray and CT abdomen pelvis from yesterday. FINDINGS: Enteric tube tip in the distal stomach. Oral contrast within the gastric fundus. Paucity of small bowel gas. Air and stool within the colon. No radio-opaque calculi or other significant radiographic abnormality are seen. Excreted contrast within the bladder. Calcified uterine fibroids. No acute osseous abnormality. IMPRESSION: 1. Oral contrast remains within the gastric fundus and has not emptied into the small bowel after 8 hours. This could be related to gastroparesis. 2. Nonspecific bowel gas pattern with paucity of small bowel gas. Electronically Signed   By: Obie DredgeWilliam T Derry M.D.   On: 03/26/2019 07:55   DG Abd Portable 1V-Small Bowel Protocol-Position Verification  Result Date: 03/25/2019 CLINICAL DATA:  NG tube placement EXAM: PORTABLE ABDOMEN - 1 VIEW COMPARISON:  None. FINDINGS: Tip of the NG tube is seen projecting into the proximal stomach. Visualized bowel gas is unremarkable. IMPRESSION: Tip the NG tube in the proximal stomach. Electronically Signed   By: Jonna ClarkBindu  Avutu M.D.   On: 03/25/2019 22:26        Scheduled Meds: . Chlorhexidine Gluconate Cloth  6 each Topical Daily  . insulin aspart  0-6 Units Subcutaneous Q4H  . insulin glargine  10 Units Subcutaneous Q24H  . pantoprazole (PROTONIX) IV  40 mg Intravenous QHS   Continuous Infusions: . sodium chloride    .  sodium chloride Stopped (03/26/19 0737)  . 0.9 % NaCl with KCl 20 mEq / L 100 mL/hr at 03/26/19 1057  . dextrose 5 % and 0.45% NaCl 75 mL/hr at 03/26/19 0735  . diltiazem (CARDIZEM) infusion 5 mg/hr (03/26/19 0306)  . sodium chloride       LOS: 1 day    Time spent: 35 minutes    Berton MountSylvester Dontavius Keim, MD  [facility] Pager #: [phone] 7PM-7AM contact night coverage as above

## 2019-03-26 NOTE — H&P (View-Only) (Signed)
General Surgery [facility]  Telephone call this AM from radiology with concerns that CT scan from yesterday shows a closed loop obstruction of the small bowel.  Patient with persistent pain this morning.  Had another episode of emesis despite NG tube.  Discussed proceeding with exploratory laparotomy with lysis of adhesions and possible small bowel resection with patient now.  She understands and agrees to proceed urgently. OR contacted and case posted.  The risks and benefits of the procedure have been discussed at length with the patient.  The patient understands the proposed procedure, potential alternative treatments, and the course of recovery to be expected.  All of the patient's questions have been answered at this time.  The patient wishes to proceed with surgery.  Dawn Gemma, MD [facility] Office: [phone]

## 2019-03-26 NOTE — Progress Notes (Signed)
Dear Doctor: Daryll Drown. This patient has been identified as a candidate for PICC for the following reason (s): IV therapy over 48 hours, poor veins/poor circulatory system (CHF, COPD, emphysema, diabetes, steroid use, IV drug abuse, etc.) and incompatible drugs (aminophyllin, TPN, heparin, given with an antibiotic) If you agree, please write an order for the indicated device. For any questions contact the Vascular Access Team at [phone] if no answer, please leave a message.  Thank you for supporting the early vascular access assessment program.

## 2019-03-26 NOTE — Progress Notes (Signed)
General Surgery - [facility]  Telephone call this AM from radiology with concerns that CT scan from yesterday shows a closed loop obstruction of the small bowel.  Patient with persistent pain this morning.  Had another episode of emesis despite NG tube.  Discussed proceeding with exploratory laparotomy with lysis of adhesions and possible small bowel resection with patient now.  She understands and agrees to proceed urgently. OR contacted and case posted.  The risks and benefits of the procedure have been discussed at length with the patient.  The patient understands the proposed procedure, potential alternative treatments, and the course of recovery to be expected.  All of the patient's questions have been answered at this time.  The patient wishes to proceed with surgery.  Zymir Napoli, MD [facility] Office: [phone]   

## 2019-03-26 NOTE — Interval H&P Note (Signed)
History and Physical Interval Note:  03/26/2019 12:13 PM  Sherril Cong  has presented today for surgery, with the diagnosis of closed loop SBO.  The various methods of treatment have been discussed with the patient and family. After consideration of risks, benefits and other options for treatment, the patient has consented to    Procedure(s): EXPLORATORY LAPAROTOMY (N/A) LYSIS OF ADHESION (N/A) SMALL BOWEL RESECTION (N/A) as a surgical intervention.    The patient's history has been reviewed, patient examined, no change in status, stable for surgery.  I have reviewed the patient's chart and labs.  Questions were answered to the patient's satisfaction.    Armandina Gemma, MD [facility] Office: [place]

## 2019-03-26 NOTE — Anesthesia Postprocedure Evaluation (Signed)
Anesthesia Post Note  Patient: Dawn Braun  Procedure(s) Performed: EXPLORATORY LAPAROTOMY (N/A Abdomen) LYSIS OF ADHESION (N/A Abdomen)     Patient location during evaluation: PACU Anesthesia Type: General Level of consciousness: awake and alert Pain management: pain level controlled Vital Signs Assessment: post-procedure vital signs reviewed and stable Respiratory status: spontaneous breathing, nonlabored ventilation and respiratory function stable Cardiovascular status: blood pressure returned to baseline and stable Postop Assessment: no apparent nausea or vomiting Anesthetic complications: no    Last Vitals:  Vitals:   03/26/19 1520 03/26/19 1545  BP:  (!) 158/69  Pulse: 99 92  Resp: 15 16  Temp: 36.7 C   SpO2: 100% 100%    Last Pain:  Vitals:   03/26/19 1520  TempSrc:   PainSc: Asleep                 Catalina Gravel

## 2019-03-26 NOTE — Op Note (Addendum)
Operative Note  Pre-operative Diagnosis:  Closed loop small bowel obstruction  Post-operative Diagnosis:  same  Surgeon:  Armandina Gemma, MD  Assistant:  Neysa Bonito, MD   Procedure:  Exploratory laparotomy with lysis of adhesions  Anesthesia:  general  Estimated Blood Loss:  minimal  Drains: none         Specimen: none  Indications: Patient is a 53 year old female admitted from the emergency department with partial small bowel obstruction.  Patient's clinical course was complicated by hyperglycemia with impending diabetic ketoacidosis, untreated hypertension, and uncontrolled atrial fibrillation without anticoagulation.  Initial CT scan showed dilated loops of small bowel suspicious for partial small bowel obstruction.  Previous surgical history included laparoscopic cholecystectomy as well as cesarean section.  On the day following admission the radiologist evaluated the CT scan again and contacted our service with the revised reading saying there was suspicion of a closed loop obstruction.  Patient was immediately prepared and brought to the operating room for exploration.  Procedure:  The patient was seen in the pre-op holding area. The risks, benefits, complications, treatment options, and expected outcomes were previously discussed with the patient. The patient agreed with the proposed plan and has signed the informed consent form.  The patient was brought to the operating room by the surgical team, identified as Dawn Braun and the procedure verified. A "time out" was completed and the above information confirmed.  Following administration of general anesthesia, the patient was positioned and then prepped and draped in usual aseptic fashion.  After ascertaining that an adequate level of anesthesia been achieved, an upper midline surgical incision is made with a #10 blade.  Dissection is carried through subcutaneous tissues.  Fascia is incised in the midline and the peritoneal  cavity is entered cautiously.  There are several hundred cc's of ascitic fluid present.  This is evacuated.  There is no odor.  Upon exploration there is a bandlike adhesion of the omentum down to the pelvis which is stretched taut across the underlying bowel.  This adhesion is lysed bluntly and the omentum is delivered up to the surgical incision.  Small bowel was then run from the ligament of Treitz to the ileocecal valve.  In the mid to distal small bowel there is a proximately a 50 cm segment of small intestine which appears hyperemic.  There are bandlike markings proximally and distally consistent with obstruction due to the omentum causing a closed loop obstruction.  The bowel appears viable.  There does not appear to be any stenotic areas.  There is no sign of perforation.  Entire abdomen is explored bimanually without other abnormality being identified.  There are some adhesions above the right lobe of the liver.  There are some adhesions around the uterus in the pelvis.  Ascitic fluid is evacuated.  The small bowel was run from ligament of Treitz to the ileocecal valve again without abnormal finding.  The involved segment of small bowel appears viable.  Small bowel was returned to the peritoneal cavity and covered with the omentum.  Midline incision is closed with interrupted #1 Novafil simple sutures.  Subcutaneous tissues are irrigated.  Skin is closed with stainless steel staples.  A honeycomb dressing is applied.  Nasogastric tube positioning was confirmed during the procedure.  Patient is awakened from anesthesia and transported to the recovery room in stable condition.  The patient tolerated the procedure well.   Armandina Gemma, MD [facility] Office: [phone]

## 2019-03-26 NOTE — Anesthesia Procedure Notes (Signed)
Arterial Line Insertion Start/End12/20/2020 1:30 PM, 03/26/2019 1:36 PM Performed by: Catalina Gravel, MD, anesthesiologist  Patient location: Pre-op. Preanesthetic checklist: patient identified, IV checked, site marked, risks and benefits discussed, surgical consent, monitors and equipment checked, pre-op evaluation, timeout performed and anesthesia consent Lidocaine 1% used for infiltration Left, radial was placed Catheter size: 20 Fr Hand hygiene performed  and maximum sterile barriers used   Attempts: 1 Procedure performed without using ultrasound guided technique. Following insertion, dressing applied and Biopatch. Post procedure assessment: normal and unchanged  Patient tolerated the procedure well with no immediate complications.

## 2019-03-26 NOTE — Progress Notes (Signed)
Inpatient Diabetes Program Recommendations  AACE/ADA: New Consensus Statement on Inpatient Glycemic Control (2015)  Target Ranges:  Prepandial:   less than 140 mg/dL      Peak postprandial:   less than 180 mg/dL (1-2 hours)      Critically ill patients:  140 - 180 mg/dL   Results for Dawn Braun, Dawn Braun (MRN [id number]) as of 03/26/2019 09:57  Ref. Range 03/25/2019 15:33  Sodium Latest Ref Range: 135 - 145 mmol/L 136  Potassium Latest Ref Range: 3.5 - 5.1 mmol/L 3.7  Chloride Latest Ref Range: 98 - 111 mmol/L 100  CO2 Latest Ref Range: 22 - 32 mmol/L 20 (L)  Glucose Latest Ref Range: 70 - 99 mg/dL 432 (H)  BUN Latest Ref Range: 6 - 20 mg/dL 23 (H)  Creatinine Latest Ref Range: 0.44 - 1.00 mg/dL 1.19 (H)  Calcium Latest Ref Range: 8.9 - 10.3 mg/dL 9.7  Anion gap Latest Ref Range: 5 - 15  16 (H)   Results for Dawn Braun, Dawn Braun (MRN [id number]) as of 03/26/2019 09:57  Ref. Range 03/26/2019 05:00 03/26/2019 06:19 03/26/2019 07:32 03/26/2019 08:32 03/26/2019 09:36  Glucose-Capillary Latest Ref Range: 70 - 99 mg/dL 288 (H)  IV Insulin Drip 256 (H)  IV Insulin Drip 176 (H)  IV Insulin Drip 170 (H)  IV Insulin Drip 157 (H)  IV Insulin Drip   Results for Dawn Braun, Dawn Braun (MRN [id number]) as of 03/26/2019 09:57  Ref. Range 09/18/2018 06:27 03/26/2019 00:04  Hemoglobin A1C Latest Ref Range: 4.8 - 5.6 % 18.3 (H) 13.6 (H)    Admit with: Partial small bowel obstruction/ Uncontrolled Diabetes  History: DM  Home DM Meds: Glipizide 5 mg BID       Metformin 1000 mg BID  Current Orders: IV Insulin Drip     BMET from 12:04am shows: Glucose 576 mg/dl CO2 level 18 Anion Gap 15  8:44am BMET pending.    MD- When patient is appropriate and safe to transition to SQ Insulin, please make sure she receives basal insulin at least 1-2 hours prior to d/c of the IV Insulin Drip  Will have Diabetes Coordinator follow up with pt on Monday to discuss current A1c and need for better  glucose management.    Extremely poor glucose control at home.  A1c back in June was 18.3% and now current A1c is 13.6%.  Only taking oral meds at home for blood sugar control--Unsure of pt taking consistently.    Likely needs insulin for home.  Has Medicaid so cost should not be an issue.  Needs better follow up with PCP for further diabetes management.     --Will follow patient during hospitalization--  Wyn Quaker RN, MSN, CDE Diabetes Coordinator Inpatient Glycemic Control Team Team Pager: [phone] (8a-5p)

## 2019-03-26 NOTE — Anesthesia Procedure Notes (Signed)
Procedure Name: Intubation Performed by: Genavie Boettger J, CRNA Pre-anesthesia Checklist: Patient identified, Emergency Drugs available, Suction available, Patient being monitored and Timeout performed Patient Re-evaluated:Patient Re-evaluated prior to induction Oxygen Delivery Method: Circle system utilized Preoxygenation: Pre-oxygenation with 100% oxygen Induction Type: IV induction and Rapid sequence Laryngoscope Size: Mac and 3 Grade View: Grade I Tube type: Oral Tube size: 7.0 mm Number of attempts: 1 Airway Equipment and Method: Stylet Placement Confirmation: ETT inserted through vocal cords under direct vision,  positive ETCO2 and breath sounds checked- equal and bilateral Secured at: 21 cm Tube secured with: Tape Dental Injury: Teeth and Oropharynx as per pre-operative assessment        

## 2019-03-26 NOTE — Anesthesia Preprocedure Evaluation (Addendum)
Anesthesia Evaluation  Patient identified by MRN, date of birth, ID band Patient awake    Reviewed: Allergy & Precautions, NPO status , Patient's Chart, lab work & pertinent test results  History of Anesthesia Complications (+) PROLONGED EMERGENCE and history of anesthetic complications  Airway Mallampati: II  TM Distance: >3 FB Neck ROM: Full    Dental  (+) Teeth Intact, Dental Advisory Given   Pulmonary asthma ,    Pulmonary exam normal breath sounds clear to auscultation       Cardiovascular hypertension, Pt. on medications and Pt. on home beta blockers + dysrhythmias Atrial Fibrillation  Rhythm:Regular Rate:Tachycardia     Neuro/Psych negative neurological ROS     GI/Hepatic Neg liver ROS, Closed loop SBO    Endo/Other  diabetes, Poorly Controlled, Type 2, Oral Hypoglycemic AgentsObesity   Renal/GU Renal disease (AKI)     Musculoskeletal negative musculoskeletal ROS (+)   Abdominal   Peds  Hematology  (+) Blood dyscrasia (Eliquis), ,   Anesthesia Other Findings Day of surgery medications reviewed with the patient.  Reproductive/Obstetrics                             Anesthesia Physical Anesthesia Plan  ASA: III and emergent  Anesthesia Plan: General   Post-op Pain Management:    Induction: Intravenous, Rapid sequence and Cricoid pressure planned  PONV Risk Score and Plan: 4 or greater and Diphenhydramine, Midazolam, Dexamethasone and Ondansetron  Airway Management Planned: Oral ETT  Additional Equipment:   Intra-op Plan:   Post-operative Plan: Extubation in OR  Informed Consent: I have reviewed the patients History and Physical, chart, labs and discussed the procedure including the risks, benefits and alternatives for the proposed anesthesia with the patient or authorized representative who has indicated his/her understanding and acceptance.     Dental advisory  given  Plan Discussed with: CRNA  Anesthesia Plan Comments:        Anesthesia Quick Evaluation

## 2019-03-26 NOTE — Transfer of Care (Signed)
Immediate Anesthesia Transfer of Care Note  Patient: Dawn Braun  Procedure(s) Performed: EXPLORATORY LAPAROTOMY (N/A Abdomen) LYSIS OF ADHESION (N/A Abdomen)  Patient Location: PACU  Anesthesia Type:General  Level of Consciousness: sedated, patient cooperative and responds to stimulation  Airway & Oxygen Therapy: Patient Spontanous Breathing and Patient connected to face mask oxygen  Post-op Assessment: Report given to RN and Post -op Vital signs reviewed and stable  Post vital signs: Reviewed and stable  Last Vitals:  Vitals Value Taken Time  BP 154/99 03/26/19 1432  Temp    Pulse 105 03/26/19 1435  Resp 22 03/26/19 1435  SpO2 100 % 03/26/19 1435  Vitals shown include unvalidated device data.  Last Pain:  Vitals:   03/26/19 0800  TempSrc: Oral  PainSc:          Complications: No apparent anesthesia complications

## 2019-03-27 LAB — CBC
HCT: 35.3 % — ABNORMAL LOW (ref 36.0–46.0)
Hemoglobin: 11.4 g/dL — ABNORMAL LOW (ref 12.0–15.0)
MCH: 28.3 pg (ref 26.0–34.0)
MCHC: 32.3 g/dL (ref 30.0–36.0)
MCV: 87.6 fL (ref 80.0–100.0)
Platelets: 283 10*3/uL (ref 150–400)
RBC: 4.03 MIL/uL (ref 3.87–5.11)
RDW: 13.7 % (ref 11.5–15.5)
WBC: 23.1 10*3/uL — ABNORMAL HIGH (ref 4.0–10.5)
nRBC: 0 % (ref 0.0–0.2)

## 2019-03-27 LAB — BASIC METABOLIC PANEL
Anion gap: 12 (ref 5–15)
BUN: 41 mg/dL — ABNORMAL HIGH (ref 6–20)
CO2: 16 mmol/L — ABNORMAL LOW (ref 22–32)
Calcium: 7.8 mg/dL — ABNORMAL LOW (ref 8.9–10.3)
Chloride: 112 mmol/L — ABNORMAL HIGH (ref 98–111)
Creatinine, Ser: 1.59 mg/dL — ABNORMAL HIGH (ref 0.44–1.00)
GFR calc Af Amer: 42 mL/min — ABNORMAL LOW (ref >60)
GFR calc non Af Amer: 36 mL/min — ABNORMAL LOW (ref >60)
Glucose, Bld: 366 mg/dL — ABNORMAL HIGH (ref 70–99)
Potassium: 4.7 mmol/L (ref 3.5–5.1)
Sodium: 140 mmol/L (ref 135–145)

## 2019-03-27 LAB — GLUCOSE, CAPILLARY
Glucose-Capillary: 307 mg/dL — ABNORMAL HIGH (ref 70–99)
Glucose-Capillary: 328 mg/dL — ABNORMAL HIGH (ref 70–99)
Glucose-Capillary: 303 mg/dL — ABNORMAL HIGH (ref 70–99)
Glucose-Capillary: 273 mg/dL — ABNORMAL HIGH (ref 70–99)
Glucose-Capillary: 241 mg/dL — ABNORMAL HIGH (ref 70–99)
Glucose-Capillary: 266 mg/dL — ABNORMAL HIGH (ref 70–99)

## 2019-03-27 MED ORDER — METHOCARBAMOL 1000 MG/10ML IJ SOLN
500.0000 mg | Freq: Four times a day (QID) | INTRAVENOUS | Status: DC | PRN
  Filled 2019-03-27: qty 5

## 2019-03-27 MED ORDER — INSULIN GLARGINE 100 UNIT/ML ~~LOC~~ SOLN
10.0000 [IU] | Freq: Once | SUBCUTANEOUS | Status: AC
  Administered 2019-03-27: 10 [IU] via SUBCUTANEOUS
  Filled 2019-03-27: qty 0.1

## 2019-03-27 MED ORDER — INSULIN GLARGINE 100 UNIT/ML ~~LOC~~ SOLN
10.0000 [IU] | Freq: Every day | SUBCUTANEOUS | Status: DC
  Administered 2019-03-28 – 2019-04-01 (×5): 10 [IU] via SUBCUTANEOUS
  Filled 2019-03-27 (×5): qty 0.1

## 2019-03-27 MED ORDER — ACETAMINOPHEN 10 MG/ML IV SOLN
1000.0000 mg | Freq: Four times a day (QID) | INTRAVENOUS | Status: AC
  Administered 2019-03-27 – 2019-03-28 (×4): 1000 mg via INTRAVENOUS
  Filled 2019-03-27 (×4): qty 100

## 2019-03-27 MED ORDER — INSULIN GLARGINE 100 UNIT/ML ~~LOC~~ SOLN: 15.0000 [IU] | Freq: Every day | SUBCUTANEOUS | Status: DC

## 2019-03-27 NOTE — Progress Notes (Signed)
Patient ID: Dawn Braun, female   DOB: [birth year], 53 y.o.   MRN: [id number]    1 Day Post-Op  Subjective: Some nausea this morning as NGT not to suction correctly when I walked in as cannister just changed.  No flatus or BM.  Having abdominal pain.  ROS: See above, otherwise other systems negative  Objective: Vital signs in last 24 hours: Temp:  [97.7 F (36.5 C)-99.4 F (37.4 C)] 99.1 F (37.3 C) (12/21 0400) Pulse Rate:  [85-112] 112 (12/21 0800) Resp:  [14-25] 19 (12/21 0800) BP: (154-191)/(65-99) 172/75 (12/21 0500) SpO2:  [97 %-100 %] 98 % (12/21 0800) Arterial Line BP: (133-189)/(62-88) 169/73 (12/21 0800) Last BM Date: 03/25/19  Intake/Output from previous day: 12/20 0701 - 12/21 0700 In: 3036.6 [I.V.:2936.6; IV Piggyback:100] Out: 720 [Urine:85; Emesis/NG output:585; Blood:50] Intake/Output this shift: Total I/O In: 318.1 [I.V.:318.1] Out: -   PE: Heart: regular, but tachy Lungs: CTAB Abd: soft, appropriately tender, absent BS, NGT with bilious output, midline wound is c/d/i with staples and a honeycomb dressing in place GU: foley with clear yellow urine  Lab Results:  Recent Labs    03/26/19 1639 03/27/19 0430  WBC 20.8* 23.1*  HGB 14.4 11.4*  HCT 44.3 35.3*  PLT 286 283   BMET Recent Labs    03/26/19 0844 03/26/19 1355 03/26/19 1639 03/27/19 0430  NA 144 142  --  140  K 4.4 4.1  --  4.7  CL 113*  --   --  112*  CO2 20*  --   --  16*  GLUCOSE 188*  --   --  366*  BUN 32*  --   --  41*  CREATININE 1.62*  --  1.58* 1.59*  CALCIUM 8.5*  --   --  7.8*   PT/INR No results for input(s): LABPROT, INR in the last 72 hours. CMP     Component Value Date/Time   NA 140 03/27/2019 0430   K 4.7 03/27/2019 0430   CL 112 (H) 03/27/2019 0430   CO2 16 (L) 03/27/2019 0430   GLUCOSE 366 (H) 03/27/2019 0430   BUN 41 (H) 03/27/2019 0430   CREATININE 1.59 (H) 03/27/2019 0430   CALCIUM 7.8 (L) 03/27/2019 0430   PROT 8.2 (H) 03/25/2019 1533   ALBUMIN 4.0 03/25/2019 1533   AST 19 03/25/2019 1533   ALT 15 03/25/2019 1533   ALKPHOS 122 03/25/2019 1533   BILITOT 0.6 03/25/2019 1533   GFRNONAA 36 (L) 03/27/2019 0430   GFRAA 42 (L) 03/27/2019 0430   Lipase     Component Value Date/Time   LIPASE 19 03/25/2019 1533       Studies/Results: CT Head Wo Contrast  Result Date: 03/25/2019 CLINICAL DATA:  Hypertension, prior contrast earlier EXAM: CT HEAD WITHOUT CONTRAST TECHNIQUE: Contiguous axial images were obtained from the base of the skull through the vertex without intravenous contrast. COMPARISON:  None. FINDINGS: Brain: No evidence of acute territorial infarction, hemorrhage, hydrocephalus,extra-axial collection or mass lesion/mass effect. Normal gray-white differentiation. Ventricles are normal in size and contour. Vascular: No hyperdense vessel or unexpected calcification. Skull: The skull is intact. No fracture or focal lesion identified. Sinuses/Orbits: The visualized paranasal sinuses and mastoid air cells are clear. The orbits and globes intact. Other: None IMPRESSION: No acute intracranial abnormality. Electronically Signed   By: Jonna ClarkBindu  Avutu M.D.   On: 03/25/2019 18:53   CT ABDOMEN PELVIS W CONTRAST  Addendum Date: 03/26/2019   ADDENDUM REPORT: 03/26/2019 08:47 ADDENDUM: Upon further review, appearance  is concerning for closed loop small bowel obstruction, with two transition points seen close to each other at the possible site of an internal hernia (series 4, image 45). These results were called by telephone at the time of interpretation on 03/26/2019 at 8:30 am to provider [facility], who verbally acknowledged these results. Electronically Signed   By: Obie Dredge M.D.   On: 03/26/2019 08:47   Result Date: 03/26/2019 CLINICAL DATA:  Nausea vomiting abdominal pain EXAM: CT ABDOMEN AND PELVIS WITH CONTRAST TECHNIQUE: Multidetector CT imaging of the abdomen and pelvis was performed using the standard protocol following  bolus administration of intravenous contrast. CONTRAST:  OMNIPAQUE IOHEXOL 300 MG/ML  SOLN COMPARISON:  December 31, 2016 FINDINGS: Lower chest: The visualized heart size within normal limits. No pericardial fluid/thickening. No hiatal hernia. Mild bibasilar dependent atelectasis is noted. Hepatobiliary: The liver is normal in density without focal abnormality.The main portal vein is patent. The patient is status post cholecystectomy. Pancreas: Unremarkable. No pancreatic ductal dilatation or surrounding inflammatory changes. Spleen: Normal in size without focal abnormality. Adrenals/Urinary Tract: Both adrenal glands appear normal. The kidneys and collecting system appear normal without evidence of urinary tract calculus or hydronephrosis. Bladder is unremarkable. Stomach/Bowel: The stomach is normal in appearance. The proximal small bowel is decompressed. There is a multiple segments of fluid and air-filled mildly dilated small bowel seen within the mid abdomen involving the distal jejunum and proximal ileum measuring up to 3 cm. There is a focal area of narrowing seen within the mid abdomen, series 4, image 47 within the distal ileal loops. There is no definite bowel wall thickening. There is mild clumped appearance seen of the mildly dilated small bowel loops within the mid abdomen.The terminal ileum is normal appearance and decompressed. There is a moderate amount of colonic stool with air and stool to the level of the rectum. The appendix is normal in appearance. Vascular/Lymphatic: There are no enlarged mesenteric, retroperitoneal, or pelvic lymph nodes. A small amount of perihepatic and abdominopelvic ascites is noted. Reproductive: Calcified posterior uterine fibroid is present. Other: No evidence of abdominal wall mass or hernia. Musculoskeletal: No acute or significant osseous findings. IMPRESSION: Findings of partial proximal small-bowel obstruction involving the distal jejunum and proximal ileum.  There is a gradual area of narrowing within the mid abdomen in the distal ileal loop. Small amount of abdominopelvic ascites. Electronically Signed: By: Jonna Clark M.D. On: 03/25/2019 18:40   DG Abd Portable 1V-Small Bowel Obstruction Protocol-initial, 8 hr delay  Result Date: 03/26/2019 CLINICAL DATA:  Small bowel obstruction. EXAM: PORTABLE ABDOMEN - 1 VIEW COMPARISON:  Abdominal x-ray and CT abdomen pelvis from yesterday. FINDINGS: Enteric tube tip in the distal stomach. Oral contrast within the gastric fundus. Paucity of small bowel gas. Air and stool within the colon. No radio-opaque calculi or other significant radiographic abnormality are seen. Excreted contrast within the bladder. Calcified uterine fibroids. No acute osseous abnormality. IMPRESSION: 1. Oral contrast remains within the gastric fundus and has not emptied into the small bowel after 8 hours. This could be related to gastroparesis. 2. Nonspecific bowel gas pattern with paucity of small bowel gas. Electronically Signed   By: Obie Dredge M.D.   On: 03/26/2019 07:55   DG Abd Portable 1V-Small Bowel Protocol-Position Verification  Result Date: 03/25/2019 CLINICAL DATA:  NG tube placement EXAM: PORTABLE ABDOMEN - 1 VIEW COMPARISON:  None. FINDINGS: Tip of the NG tube is seen projecting into the proximal stomach. Visualized bowel gas is unremarkable. IMPRESSION:  Tip the NG tube in the proximal stomach. Electronically Signed   By: Prudencio Pair M.D.   On: 03/25/2019 22:26    Anti-infectives: Anti-infectives (From admission, onward)   Start     Dose/Rate Route Frequency Ordered Stop   03/26/19 1300  ertapenem (INVANZ) 1,000 mg in sodium chloride 0.9 % 100 mL IVPB     1 g 200 mL/hr over 30 Minutes Intravenous On call to O.R. 03/26/19 1250 03/26/19 1351       Assessment/Plan DM with DKA - per medicine, CBG 366 this am HTN - per medicine ARF - cr improved to 1.59 today  POD 1, s/p ex lap with LOA for CL SBO, Dr. Harlow Asa  12/20 -cont NGT and await ileus resolution -mobilize TID in halls -pulm toilet and IS -follow CBC and WBC, 21K today, likely reactive at this point given no evidence of intraoperative infection -multi-modal pain control  FEN - NPO/NGT/IVFs VTE - lovenox ID - invanz preop   LOS: 2 days    Henreitta Cea , [facility] 03/27/2019, 8:52 AM Please see Amion for pager number during day hours 7:00am-4:30pm

## 2019-03-27 NOTE — Progress Notes (Signed)
Pt up ambulating in room with RN and NT stand by assist per order after using bedside commode, pt was walking slowly and reported she felt funny, pt placed in chair at that time, syncopal episode noted, no loss of consciousness but pt was drowsy and hard to keep awake post episode, BP went from 170s/90s to 120s/60s per a-line.   Was able to safety get back to bed with additional support. Pt did return to baseline alertness after approx 15 mins. VSS, BS WNL upon check.   Pt reports this has happened in the past.   [facility] MD notified, no new orders received.

## 2019-03-27 NOTE — Progress Notes (Signed)
[facility] on-call contacted regarding pt's 0000 and 0400 CBGs being over 300. No new orders were given.

## 2019-03-27 NOTE — Progress Notes (Signed)
PRN hydralazine given, pts BP remains in 170s/180s, [facility] MD [place] notified, no new orders received.   Will continue to give PRN hydralazine Q6 per order and notify MD for systolic >975 per communication.

## 2019-03-27 NOTE — Progress Notes (Addendum)
Progress Note    Dawn Braun  [id number]:[id number] DOB: May 31, 1965  DOA: 03/25/2019 PCP: Seward Carol, MD           Assessment/Plan:   Principal Problem:   Closed loop SBO (small bowel obstruction) s/p exlap /lysis of adhesions 03/26/2019 Active Problems:   Diabetes mellitus type 2, uncontrolled, with complications ([place])   Paroxysmal atrial fibrillation (HCC)   Body mass index is 36.9 kg/m.  (Morbid obesity)  Closed-loop small bowel obstruction s/p ex lap with lysis of adhesions on 03/26/2019: Follow-up with general surgeon for further management.  Analgesics as needed for pain.  Type 2 diabetes mellitus with severe hyperglycemia: Hemoglobin A1c 13.6.  Increase Lantus for adequate glucose control.  AKI on CKD stage IIIa: Continue IV fluids.  Patient remains n.p.o.  Paroxysmal atrial fibrillation with RVR: Taper off IV Cardizem infusion as able  Severe hypertension/hypertensive urgency: Continue IV hydralazine as needed.  Patient is also on IV Cardizem infusion.  Epigastric plan: Continue IV Protonix.   Family Communication/Anticipated D/C date and plan/Code Status   DVT prophylaxis: Lovenox Code Status: Full code Family Communication: Plan discussed with the patient Disposition Plan: To be determined      Subjective:   C/o abdominal pain  Objective:    Vitals:   03/27/19 0400 03/27/19 0500 03/27/19 0700 03/27/19 0800  BP: (!) 172/79 (!) 172/75    Pulse: (!) 111 (!) 112 (!) 111 (!) 112  Resp: 20 17 19 19   Temp: 99.1 F (37.3 C)   98.9 F (37.2 C)  TempSrc: Oral   Oral  SpO2: 98% 98% 99% 98%  Weight:      Height:        Intake/Output Summary (Last 24 hours) at 03/27/2019 1152 Last data filed at 03/27/2019 0800 Gross per 24 hour  Intake 2568.76 ml  Output 270 ml  Net 2298.76 ml   Filed Weights   03/26/19 0054  Weight: 91.5 kg    Exam:  GEN: NAD SKIN: No rash EYES: No pallor or icterus ENT: MMM, NG tube to low wall  suction CV: RRR PULM: CTA B ABD: soft, ND, surgical tenderness, could not hear bowel sounds, midline surgical wound with staples is clean, dry and intact, CNS: AAO x 3, non focal EXT: No edema or tenderness   Data Reviewed:   I have personally reviewed following labs and imaging studies:  Labs: Labs show the following:   Basic Metabolic Panel: Recent Labs  Lab 03/25/19 1533 03/26/19 0004 03/26/19 0844 03/26/19 1355 03/26/19 1639 03/27/19 0430  NA 136 135 144 142  --  140  K 3.7 5.2* 4.4 4.1  --  4.7  CL 100 102 113*  --   --  112*  CO2 20* 18* 20*  --   --  16*  GLUCOSE 432* 576* 188*  --   --  366*  BUN 23* 26* 32*  --   --  41*  CREATININE 1.19* 1.34* 1.62*  --  1.58* 1.59*  CALCIUM 9.7 8.7* 8.5*  --   --  7.8*   GFR Estimated Creatinine Clearance: 42.6 mL/min (A) (by C-G formula based on SCr of 1.59 mg/dL (H)). Liver Function Tests: Recent Labs  Lab 03/25/19 1533  AST 19  ALT 15  ALKPHOS 122  BILITOT 0.6  PROT 8.2*  ALBUMIN 4.0   Recent Labs  Lab 03/25/19 1533  LIPASE 19   No results for input(s): AMMONIA in the last 168 hours. Coagulation profile No  results for input(s): INR, PROTIME in the last 168 hours.  CBC: Recent Labs  Lab 03/25/19 1533 03/26/19 1355 03/26/19 1639 03/27/19 0430  WBC 9.5  --  20.8* 23.1*  NEUTROABS 5.6  --   --   --   HGB 13.9 10.9* 14.4 11.4*  HCT 43.5 32.0* 44.3 35.3*  MCV 85.8  --  85.4 87.6  PLT 370  --  286 283   Cardiac Enzymes: No results for input(s): CKTOTAL, CKMB, CKMBINDEX, TROPONINI in the last 168 hours. BNP (last 3 results) No results for input(s): PROBNP in the last 8760 hours. CBG: Recent Labs  Lab 03/26/19 1549 03/26/19 1654 03/26/19 1937 03/26/19 2321 03/27/19 0325  GLUCAP 132* 162* 221* 311* 307*   D-Dimer: No results for input(s): DDIMER in the last 72 hours. Hgb A1c: Recent Labs    03/26/19 0004  HGBA1C 13.6*   Lipid Profile: No results for input(s): CHOL, HDL, LDLCALC, TRIG,  CHOLHDL, LDLDIRECT in the last 72 hours. Thyroid function studies: No results for input(s): TSH, T4TOTAL, T3FREE, THYROIDAB in the last 72 hours.  Invalid input(s): FREET3 Anemia work up: No results for input(s): VITAMINB12, FOLATE, FERRITIN, TIBC, IRON, RETICCTPCT in the last 72 hours. Sepsis Labs: Recent Labs  Lab 03/25/19 1533 03/26/19 1639 03/27/19 0430  WBC 9.5 20.8* 23.1*    Microbiology Recent Results (from the past 240 hour(s))  SARS CORONAVIRUS 2 (TAT 6-24 HRS) Nasopharyngeal Nasopharyngeal Swab     Status: None   Collection Time: 03/25/19  8:02 PM   Specimen: Nasopharyngeal Swab  Result Value Ref Range Status   SARS Coronavirus 2 NEGATIVE NEGATIVE Final    Comment: (NOTE) SARS-CoV-2 target nucleic acids are NOT DETECTED. The SARS-CoV-2 RNA is generally detectable in upper and lower respiratory specimens during the acute phase of infection. Negative results do not preclude SARS-CoV-2 infection, do not rule out co-infections with other pathogens, and should not be used as the sole basis for treatment or other patient management decisions. Negative results must be combined with clinical observations, patient history, and epidemiological information. The expected result is Negative. Fact Sheet for Patients: HairSlick.no Fact Sheet for Healthcare Providers: quierodirigir.com This test is not yet approved or cleared by the [place] FDA and  has been authorized for detection and/or diagnosis of SARS-CoV-2 by FDA under an Emergency Use Authorization (EUA). This EUA will remain  in effect (meaning this test can be used) for the duration of the COVID-19 declaration under Section 56 4(b)(1) of the Act, 21 U.S.C. section 360bbb-3(b)(1), unless the authorization is terminated or revoked sooner. Performed at [facility], [address], [place], [place] [address]   MRSA PCR Screening     Status: None    Collection Time: 03/26/19  1:29 AM   Specimen: Nasopharyngeal  Result Value Ref Range Status   MRSA by PCR NEGATIVE NEGATIVE Final    Comment:        The GeneXpert MRSA Assay (FDA approved for NASAL specimens only), is one component of a comprehensive MRSA colonization surveillance program. It is not intended to diagnose MRSA infection nor to guide or monitor treatment for MRSA infections. Performed at [facility], [address] [place], [place] [address]     Procedures and diagnostic studies:  CT Head Wo Contrast  Result Date: 03/25/2019 CLINICAL DATA:  Hypertension, prior contrast earlier EXAM: CT HEAD WITHOUT CONTRAST TECHNIQUE: Contiguous axial images were obtained from the base of the skull through the vertex without intravenous contrast. COMPARISON:  None. FINDINGS: Brain: No evidence of acute territorial infarction, hemorrhage, hydrocephalus,extra-axial collection or mass lesion/mass effect. Normal gray-white differentiation. Ventricles are normal in size and contour. Vascular: No hyperdense vessel or unexpected calcification. Skull: The skull is intact. No fracture or focal lesion identified. Sinuses/Orbits: The visualized paranasal sinuses and mastoid air cells are clear. The orbits and globes intact. Other: None IMPRESSION: No acute intracranial abnormality. Electronically Signed   By: Jonna Clark M.D.   On: 03/25/2019 18:53   CT ABDOMEN PELVIS W CONTRAST  Addendum Date: 03/26/2019   ADDENDUM REPORT: 03/26/2019 08:47 ADDENDUM: Upon further review, appearance is concerning for closed loop small bowel obstruction, with two transition points seen close to each other at the possible site of an internal hernia (series 4, image 45). These results were called by telephone at the time of interpretation on 03/26/2019 at 8:30 am to provider [facility], who verbally acknowledged these results. Electronically Signed   By: Obie Dredge M.D.   On: 03/26/2019 08:47    Result Date: 03/26/2019 CLINICAL DATA:  Nausea vomiting abdominal pain EXAM: CT ABDOMEN AND PELVIS WITH CONTRAST TECHNIQUE: Multidetector CT imaging of the abdomen and pelvis was performed using the standard protocol following bolus administration of intravenous contrast. CONTRAST:  OMNIPAQUE IOHEXOL 300 MG/ML  SOLN COMPARISON:  December 31, 2016 FINDINGS: Lower chest: The visualized heart size within normal limits. No pericardial fluid/thickening. No hiatal hernia. Mild bibasilar dependent atelectasis is noted. Hepatobiliary: The liver is normal in density without focal abnormality.The main portal vein is patent. The patient is status post cholecystectomy. Pancreas: Unremarkable. No pancreatic ductal dilatation or surrounding inflammatory changes. Spleen: Normal in size without focal abnormality. Adrenals/Urinary Tract: Both adrenal glands appear normal. The kidneys and collecting system appear normal without evidence of urinary tract calculus or hydronephrosis. Bladder is unremarkable. Stomach/Bowel: The stomach is normal in appearance. The proximal small bowel is decompressed. There is a multiple segments of fluid and air-filled mildly dilated small bowel seen within the mid abdomen involving the distal jejunum and proximal ileum measuring up to 3 cm. There is a focal area of narrowing seen within the mid abdomen, series 4, image 47 within the distal ileal loops. There is no definite bowel wall thickening. There is mild clumped appearance seen of the mildly dilated small bowel loops within the mid abdomen.The terminal ileum is normal appearance and decompressed. There is a moderate amount of colonic stool with air and stool to the level of the rectum. The appendix is normal in appearance. Vascular/Lymphatic: There are no enlarged mesenteric, retroperitoneal, or pelvic lymph nodes. A small amount of perihepatic and abdominopelvic ascites is noted. Reproductive: Calcified posterior uterine fibroid is  present. Other: No evidence of abdominal wall mass or hernia. Musculoskeletal: No acute or significant osseous findings. IMPRESSION: Findings of partial proximal small-bowel obstruction involving the distal jejunum and proximal ileum. There is a gradual area of narrowing within the mid abdomen in the distal ileal loop. Small amount of abdominopelvic ascites. Electronically Signed: By: Jonna Clark M.D. On: 03/25/2019 18:40   DG Abd Portable 1V-Small Bowel Obstruction Protocol-initial, 8 hr delay  Result Date: 03/26/2019 CLINICAL DATA:  Small bowel obstruction. EXAM: PORTABLE ABDOMEN - 1 VIEW COMPARISON:  Abdominal x-ray and CT abdomen pelvis from yesterday. FINDINGS: Enteric tube tip in the distal stomach. Oral contrast within the gastric fundus. Paucity of small bowel gas. Air and stool within the colon. No radio-opaque calculi or other significant radiographic abnormality are seen. Excreted contrast within the bladder. Calcified uterine  fibroids. No acute osseous abnormality. IMPRESSION: 1. Oral contrast remains within the gastric fundus and has not emptied into the small bowel after 8 hours. This could be related to gastroparesis. 2. Nonspecific bowel gas pattern with paucity of small bowel gas. Electronically Signed   By: Obie DredgeWilliam T Derry M.D.   On: 03/26/2019 07:55   DG Abd Portable 1V-Small Bowel Protocol-Position Verification  Result Date: 03/25/2019 CLINICAL DATA:  NG tube placement EXAM: PORTABLE ABDOMEN - 1 VIEW COMPARISON:  None. FINDINGS: Tip of the NG tube is seen projecting into the proximal stomach. Visualized bowel gas is unremarkable. IMPRESSION: Tip the NG tube in the proximal stomach. Electronically Signed   By: Jonna ClarkBindu  Avutu M.D.   On: 03/25/2019 22:26    Medications:   . Chlorhexidine Gluconate Cloth  6 each Topical Daily  . enoxaparin (LOVENOX) injection  40 mg Subcutaneous Q24H  . insulin aspart  0-6 Units Subcutaneous Q4H  . insulin glargine  10 Units Subcutaneous Daily  .  pantoprazole (PROTONIX) IV  40 mg Intravenous QHS   Continuous Infusions: . sodium chloride 100 mL/hr at 03/27/19 0800  . acetaminophen    . diltiazem (CARDIZEM) infusion 7.5 mg/hr (03/27/19 0800)  . methocarbamol (ROBAXIN) IV    . sodium chloride       LOS: 2 days   Bennie Scaff  [facility]   *Please refer to [email or web address], password [email or web address] to get updated schedule on who will round on this patient, as hospitalists switch teams weekly. If 7PM-7AM, please contact night-coverage at [email or web address], password [email or web address] for any overnight needs.  03/27/2019, 11:52 AM

## 2019-03-28 DIAGNOSIS — E669 Obesity, unspecified: Secondary | ICD-10-CM

## 2019-03-28 DIAGNOSIS — N183 Chronic kidney disease, stage 3 unspecified: Secondary | ICD-10-CM

## 2019-03-28 DIAGNOSIS — Z7901 Long term (current) use of anticoagulants: Secondary | ICD-10-CM

## 2019-03-28 LAB — CBC
HCT: 34.7 % — ABNORMAL LOW (ref 36.0–46.0)
Hemoglobin: 11 g/dL — ABNORMAL LOW (ref 12.0–15.0)
MCH: 27.6 pg (ref 26.0–34.0)
MCHC: 31.7 g/dL (ref 30.0–36.0)
MCV: 87.2 fL (ref 80.0–100.0)
Platelets: 278 10*3/uL (ref 150–400)
RBC: 3.98 MIL/uL (ref 3.87–5.11)
RDW: 14.1 % (ref 11.5–15.5)
WBC: 22.8 10*3/uL — ABNORMAL HIGH (ref 4.0–10.5)
nRBC: 0 % (ref 0.0–0.2)

## 2019-03-28 LAB — GLUCOSE, CAPILLARY
Glucose-Capillary: 224 mg/dL — ABNORMAL HIGH (ref 70–99)
Glucose-Capillary: 201 mg/dL — ABNORMAL HIGH (ref 70–99)
Glucose-Capillary: 213 mg/dL — ABNORMAL HIGH (ref 70–99)
Glucose-Capillary: 207 mg/dL — ABNORMAL HIGH (ref 70–99)
Glucose-Capillary: 149 mg/dL — ABNORMAL HIGH (ref 70–99)
Glucose-Capillary: 211 mg/dL — ABNORMAL HIGH (ref 70–99)

## 2019-03-28 LAB — BASIC METABOLIC PANEL
Anion gap: 8 (ref 5–15)
BUN: 39 mg/dL — ABNORMAL HIGH (ref 6–20)
CO2: 19 mmol/L — ABNORMAL LOW (ref 22–32)
Calcium: 8.1 mg/dL — ABNORMAL LOW (ref 8.9–10.3)
Chloride: 115 mmol/L — ABNORMAL HIGH (ref 98–111)
Creatinine, Ser: 1.29 mg/dL — ABNORMAL HIGH (ref 0.44–1.00)
GFR calc Af Amer: 54 mL/min — ABNORMAL LOW (ref >60)
GFR calc non Af Amer: 47 mL/min — ABNORMAL LOW (ref >60)
Glucose, Bld: 246 mg/dL — ABNORMAL HIGH (ref 70–99)
Potassium: 3.8 mmol/L (ref 3.5–5.1)
Sodium: 142 mmol/L (ref 135–145)

## 2019-03-28 MED ORDER — SODIUM CHLORIDE 0.9 % IV SOLN
25.0000 mg | Freq: Four times a day (QID) | INTRAVENOUS | Status: DC | PRN
  Administered 2019-03-28: 25 mg via INTRAVENOUS
  Filled 2019-03-28 (×2): qty 1

## 2019-03-28 MED ORDER — ACETAMINOPHEN 650 MG RE SUPP: 650.0000 mg | Freq: Four times a day (QID) | RECTAL | Status: DC | PRN

## 2019-03-28 MED ORDER — FLECAINIDE ACETATE 100 MG PO TABS
100.0000 mg | ORAL_TABLET | Freq: Two times a day (BID) | ORAL | Status: DC
  Administered 2019-03-29 (×2): 100 mg
  Filled 2019-03-28 (×4): qty 1

## 2019-03-28 MED ORDER — MAGIC MOUTHWASH
15.0000 mL | Freq: Four times a day (QID) | ORAL | Status: DC | PRN
  Filled 2019-03-28: qty 15

## 2019-03-28 MED ORDER — MENTHOL 3 MG MT LOZG: 1.0000 | LOZENGE | OROMUCOSAL | Status: DC | PRN

## 2019-03-28 MED ORDER — DILTIAZEM HCL ER COATED BEADS 120 MG PO CP24: 240.0000 mg | ORAL_CAPSULE | Freq: Every day | ORAL | Status: DC

## 2019-03-28 MED ORDER — PHENOL 1.4 % MT LIQD
2.0000 | OROMUCOSAL | Status: DC | PRN
  Filled 2019-03-28: qty 177

## 2019-03-28 MED ORDER — LIP MEDEX EX OINT
1.0000 "application " | TOPICAL_OINTMENT | Freq: Two times a day (BID) | CUTANEOUS | Status: DC
  Administered 2019-03-28 – 2019-04-01 (×7): 1 via TOPICAL
  Filled 2019-03-28 (×2): qty 7

## 2019-03-28 MED ORDER — METHOCARBAMOL 1000 MG/10ML IJ SOLN
1000.0000 mg | Freq: Four times a day (QID) | INTRAVENOUS | Status: DC | PRN
  Administered 2019-03-28: 1000 mg via INTRAVENOUS
  Filled 2019-03-28: qty 10
  Filled 2019-03-28: qty 1000
  Filled 2019-03-28: qty 10

## 2019-03-28 MED ORDER — METOPROLOL TARTRATE 25 MG PO TABS: 25.0000 mg | ORAL_TABLET | Freq: Two times a day (BID) | ORAL | Status: DC

## 2019-03-28 MED ORDER — BISACODYL 10 MG RE SUPP: 10.0000 mg | Freq: Two times a day (BID) | RECTAL | Status: DC | PRN

## 2019-03-28 MED ORDER — FLECAINIDE ACETATE 100 MG PO TABS
100.0000 mg | ORAL_TABLET | Freq: Two times a day (BID) | ORAL | Status: DC
  Filled 2019-03-28: qty 1

## 2019-03-28 MED ORDER — LACTATED RINGERS IV BOLUS: 1000.0000 mL | Freq: Three times a day (TID) | INTRAVENOUS | Status: AC | PRN

## 2019-03-28 MED ORDER — DIPHENHYDRAMINE HCL 50 MG/ML IJ SOLN: 12.5000 mg | Freq: Four times a day (QID) | INTRAMUSCULAR | Status: DC | PRN

## 2019-03-28 NOTE — Progress Notes (Addendum)
Progress Note    Dawn Braun  VOZ:[id number] DOB: Dec 28, 1965  DOA: 03/25/2019 PCP: Seward Carol, MD           Assessment/Plan:   Principal Problem:   Closed loop SBO (small bowel obstruction) s/p exlap /lysis of adhesions 03/26/2019 Active Problems:   Diabetes mellitus type 2, uncontrolled, with complications (HCC)   Paroxysmal atrial fibrillation (HCC)   Asthma   AKI (acute kidney injury) ([place])   Acute renal failure superimposed on stage 3 chronic kidney disease (HCC)   CKD (chronic kidney disease) stage 3, GFR 30-59 ml/min   Obesity (BMI 30-39.9)   Chronic anticoagulation   Body mass index is 36.9 kg/m.  (Morbid obesity)  Closed-loop small bowel obstruction s/p ex lap with lysis of adhesions on 03/26/2019: Follow-up with general surgeon for further management.  Analgesics as needed for pain.  Leukocytosis: This is probably due to recent surgery.  Continue to monitor.  Type 2 diabetes mellitus with severe hyperglycemia: Hemoglobin A1c 13.6.  Continue Lantus  AKI on CKD stage IIIa: Discontinue IV fluids because of developing fluid overload/peripheral edema.    Paroxysmal atrial fibrillation with RVR: Telemetry shows sinus tachycardia.  Discontinue IV Cardizem infusion and resume oral Cardizem, flecainide and metoprolol.  Severe hypertension/hypertensive urgency: Resume oral antihypertensives.  Continue IV hydralazine as needed.    Epigastric plan: Continue IV Protonix.   Family Communication/Anticipated D/C date and plan/Code Status   DVT prophylaxis: Lovenox Code Status: Full code Family Communication: Plan discussed with the patient Disposition Plan: To be determined      Subjective:   She still has abdominal pain from surgery.  She has passed flatus but not moved her bowels.  Objective:    Vitals:   03/28/19 0900 03/28/19 1000 03/28/19 1030 03/28/19 1100  BP: (!) 179/73 (!) 184/76 (!) 177/79 (!) 179/72  Pulse: (!) 111 (!) 110 (!)  145 (!) 119  Resp: 13 12 12 13   Temp:      TempSrc:      SpO2: 99% 98% 99% 99%  Weight:      Height:        Intake/Output Summary (Last 24 hours) at 03/28/2019 1119 Last data filed at 03/28/2019 1000 Gross per 24 hour  Intake 2946.45 ml  Output 1450 ml  Net 1496.45 ml   Filed Weights   03/26/19 0054  Weight: 91.5 kg    Exam:  GEN: No acute distress.   SKIN: Midline surgical wound with staples is clean, dry and intact. EYES: Anicteric ENT: NG-tube in place.   CV: Tachycardic but regular rhythm PULM: CTA B ABD: soft, mildly distended, surgical tenderness, +BS, CNS: AAO x 3, non focal EXT: Edema and mild tenderness of bilateral hands and arms   Data Reviewed:   I have personally reviewed following labs and imaging studies:  Labs: Labs show the following:   Basic Metabolic Panel: Recent Labs  Lab 03/25/19 1533 03/26/19 0004 03/26/19 0844 03/26/19 1355 03/26/19 1639 03/27/19 0430 03/28/19 0622  NA 136 135 144 142  --  140 142  K 3.7 5.2* 4.4 4.1  --  4.7 3.8  CL 100 102 113*  --   --  112* 115*  CO2 20* 18* 20*  --   --  16* 19*  GLUCOSE 432* 576* 188*  --   --  366* 246*  BUN 23* 26* 32*  --   --  41* 39*  CREATININE 1.19* 1.34* 1.62*  --  1.58* 1.59* 1.29*  CALCIUM 9.7 8.7* 8.5*  --   --  7.8* 8.1*   GFR Estimated Creatinine Clearance: 52.5 mL/min (A) (by C-G formula based on SCr of 1.29 mg/dL (H)). Liver Function Tests: Recent Labs  Lab 03/25/19 1533  AST 19  ALT 15  ALKPHOS 122  BILITOT 0.6  PROT 8.2*  ALBUMIN 4.0   Recent Labs  Lab 03/25/19 1533  LIPASE 19   No results for input(s): AMMONIA in the last 168 hours. Coagulation profile No results for input(s): INR, PROTIME in the last 168 hours.  CBC: Recent Labs  Lab 03/25/19 1533 03/26/19 1355 03/26/19 1639 03/27/19 0430 03/28/19 0622  WBC 9.5  --  20.8* 23.1* 22.8*  NEUTROABS 5.6  --   --   --   --   HGB 13.9 10.9* 14.4 11.4* 11.0*  HCT 43.5 32.0* 44.3 35.3* 34.7*  MCV 85.8   --  85.4 87.6 87.2  PLT 370  --  286 283 278   Cardiac Enzymes: No results for input(s): CKTOTAL, CKMB, CKMBINDEX, TROPONINI in the last 168 hours. BNP (last 3 results) No results for input(s): PROBNP in the last 8760 hours. CBG: Recent Labs  Lab 03/27/19 1715 03/27/19 1937 03/27/19 2326 03/28/19 0339 03/28/19 0738  GLUCAP 273* 241* 266* 224* 201*   D-Dimer: No results for input(s): DDIMER in the last 72 hours. Hgb A1c: Recent Labs    03/26/19 0004  HGBA1C 13.6*   Lipid Profile: No results for input(s): CHOL, HDL, LDLCALC, TRIG, CHOLHDL, LDLDIRECT in the last 72 hours. Thyroid function studies: No results for input(s): TSH, T4TOTAL, T3FREE, THYROIDAB in the last 72 hours.  Invalid input(s): FREET3 Anemia work up: No results for input(s): VITAMINB12, FOLATE, FERRITIN, TIBC, IRON, RETICCTPCT in the last 72 hours. Sepsis Labs: Recent Labs  Lab 03/25/19 1533 03/26/19 1639 03/27/19 0430 03/28/19 0622  WBC 9.5 20.8* 23.1* 22.8*    Microbiology Recent Results (from the past 240 hour(s))  SARS CORONAVIRUS 2 (TAT 6-24 HRS) Nasopharyngeal Nasopharyngeal Swab     Status: None   Collection Time: 03/25/19  8:02 PM   Specimen: Nasopharyngeal Swab  Result Value Ref Range Status   SARS Coronavirus 2 NEGATIVE NEGATIVE Final    Comment: (NOTE) SARS-CoV-2 target nucleic acids are NOT DETECTED. The SARS-CoV-2 RNA is generally detectable in upper and lower respiratory specimens during the acute phase of infection. Negative results do not preclude SARS-CoV-2 infection, do not rule out co-infections with other pathogens, and should not be used as the sole basis for treatment or other patient management decisions. Negative results must be combined with clinical observations, patient history, and epidemiological information. The expected result is Negative. Fact Sheet for Patients: HairSlick.nohttps://www.fda.gov/media/138098/download Fact Sheet for Healthcare  Providers: quierodirigir.comhttps://www.fda.gov/media/138095/download This test is not yet approved or cleared by the [place] FDA and  has been authorized for detection and/or diagnosis of SARS-CoV-2 by FDA under an Emergency Use Authorization (EUA). This EUA will remain  in effect (meaning this test can be used) for the duration of the COVID-19 declaration under Section 56 4(b)(1) of the Act, 21 U.S.C. section 360bbb-3(b)(1), unless the authorization is terminated or revoked sooner. Performed at [facility], [address] [place], [place] [address]   MRSA PCR Screening     Status: None   Collection Time: 03/26/19  1:29 AM   Specimen: Nasopharyngeal  Result Value Ref Range Status   MRSA by PCR NEGATIVE NEGATIVE Final    Comment:        The  GeneXpert MRSA Assay (FDA approved for NASAL specimens only), is one component of a comprehensive MRSA colonization surveillance program. It is not intended to diagnose MRSA infection nor to guide or monitor treatment for MRSA infections. Performed at [facility], [address], [place], [place] [address]     Procedures and diagnostic studies:  No results found.  Medications:   . Chlorhexidine Gluconate Cloth  6 each Topical Daily  . enoxaparin (LOVENOX) injection  40 mg Subcutaneous Q24H  . insulin aspart  0-6 Units Subcutaneous Q4H  . insulin glargine  10 Units Subcutaneous Daily  . lip balm  1 application Topical BID  . pantoprazole (PROTONIX) IV  40 mg Intravenous QHS   Continuous Infusions: . chlorproMAZINE (THORAZINE) IV    . lactated ringers    . methocarbamol (ROBAXIN) IV    . sodium chloride       LOS: 3 days   Jandiel Magallanes  [facility]   *Please refer to [email or web address], password [email or web address] to get updated schedule on who will round on this patient, as hospitalists switch teams weekly. If 7PM-7AM, please contact night-coverage at [email or web address], password [email or web address] for any overnight needs.  03/28/2019,  11:19 AM

## 2019-03-28 NOTE — Progress Notes (Addendum)
Dawn Braun [id number] 1965-12-20  CARE TEAM:  PCP: Renford Dills, MD  Outpatient Care Team: Patient Care Team: Renford Dills, MD as PCP - General (Internal Medicine)  Inpatient Treatment Team: Treatment Team: Attending Provider: Montez Morita, Md, MD; Registered Nurse: Rella Larve, RN; Consulting Physician: Massie Kluver, MD; Registered Nurse: Wende Crease, RN; Registered Nurse: Maricela Bo, RN; Registered Nurse: Julio Alm, RN   Problem List:   Principal Problem:   Closed loop SBO (small bowel obstruction) s/p exlap [name] of adhesions 03/26/2019 Active Problems:   Diabetes mellitus type 2, uncontrolled, with complications (HCC)   Paroxysmal atrial fibrillation (HCC)   Asthma   AKI (acute kidney injury) (HCC)   Acute renal failure superimposed on stage 3 chronic kidney disease (HCC)   CKD (chronic kidney disease) stage 3, GFR 30-59 ml/min   Obesity (BMI 30-39.9)   Chronic anticoagulation   2 Days Post-Op  03/26/2019  Operative Note  Procedure:  Exploratory laparotomy with lysis of adhesions  Pre-operative Diagnosis:  Closed loop small bowel obstruction  Post-operative Diagnosis:  same  There are several 100 cc of ascitic fluid present.  This is evacuated.  There is no odor.  Upon exploration there is a bandlike adhesion of the omentum down to the pelvis which is stretched taut across the underlying bowel.   In the mid to distal small bowel there is a proximately a 50 cm segment of small intestine which appears hyperemic.  There are bandlike markings proximally and distally consistent with obstruction due to the omentum causing a closed loop obstruction.  The bowel appears viable.  There does not appear to be any stenotic areas.  There is no sign of perforation.  Surgeon:  Darnell Level, MD  Assistant:  Estelle Grumbles, MD    Assessment/Plan DM with DKA.  Chronically uncontrolled.- per medicine, adjusting Lantus and insulin.   HTN - per  medicine Paroxysmal atrial fibrillation on IV diltiazem.  Will need to continue IV medications as patient had emergency surgery for obstruction and ileus.  Enteral medications will not work.  If has flatus and lower output, consider clamping trial. ARF - Acute kidney injury on top of chronic kidney disease #3 stabilizing.  Nonoliguric.   s/p ex lap with LOA for closed-loop SBO.  Small bowel ischemia inflammation but not to the point of severe ischemia or necrosis, Dr. Gerrit Friends 12/20 -cont NGT and await ileus resolution -Nausea control.  Add Thorazine for hiccups. -mobilize TID in halls -pulm toilet and IS -follow CBC and WBC, 21K today, likely reactive at this point given no evidence of intraoperative infection -multi-modal pain control  FEN - NPO/NGT/IVFs.  VTE - lovenox ID - invanz preop.  Perioperative leukocytosis but clinically okay.  Follow off antibiotics for now.  03/28/2019    Subjective: (Chief complaint)  Atrial fibrillation on IV diltiazem.  Patient complaining of hiccups.  Denies much abdominal pain.  Was lightheaded and dizzy on trying to transfer to a chair yesterday.  Feels better now.  Objective:  Vital signs:  Vitals:   03/28/19 0353 03/28/19 0400 03/28/19 0600 03/28/19 0700  BP:  (!) 168/66 (!) 173/70 (!) 173/68  Pulse:  (!) 115 (!) 108 (!) 105  Resp:  Temp: 98.3 F (36.8 C)     TempSrc: Oral     SpO2:  99% 96% 97%  Weight:      Height:        Last BM Date: 03/25/19  Intake/Output  Yesterday:  12/21 0701 - 12/22 0700 In: 3359.2 [P.O.:240; I.V.:2768.6; IV Piggyback:350.6] Out: 1550 [Urine:650; Emesis/NG output:900] This shift:  No intake/output data recorded.  Bowel function:  Flatus: Scant  BM:  No  Drain: Nasogastric tube with thin bilious output   Physical Exam:  General: Pt awake/alert/oriented x4 in mild acute distress Eyes: PERRL, normal EOM.  Sclera clear.  No icterus Neuro: CN II-XII intact w/o focal  sensory/motor deficits. Lymph: No head/neck/groin lymphadenopathy Psych:  No delerium/psychosis/paranoia HENT: Normocephalic, Mucus membranes moist.  No thrush Neck: Supple, No tracheal deviation Chest: No chest wall pain w good excursion CV:  Pulses intact.  Regular rhythm MS: Normal AROM mjr joints.  No obvious deformity  Abdomen: Somewhat firm.  Moderately distended.  Mildly tender at incisions only.  No evidence of peritonitis.  No incarcerated hernias.  Ext:  No deformity.  No mjr edema.  No cyanosis Skin: No petechiae / purpura  Results:   Cultures: Recent Results (from the past 720 hour(s))  SARS CORONAVIRUS 2 (TAT 6-24 HRS) Nasopharyngeal Nasopharyngeal Swab     Status: None   Collection Time: 03/25/19  8:02 PM   Specimen: Nasopharyngeal Swab  Result Value Ref Range Status   SARS Coronavirus 2 NEGATIVE NEGATIVE Final    Comment: (NOTE) SARS-CoV-2 target nucleic acids are NOT DETECTED. The SARS-CoV-2 RNA is generally detectable in upper and lower respiratory specimens during the acute phase of infection. Negative results do not preclude SARS-CoV-2 infection, do not rule out co-infections with other pathogens, and should not be used as the sole basis for treatment or other patient management decisions. Negative results must be combined with clinical observations, patient history, and epidemiological information. The expected result is Negative. Fact Sheet for Patients: SugarRoll.be Fact Sheet for Healthcare Providers: https://www.woods-mathews.com/ This test is not yet approved or cleared by the [place] FDA and  has been authorized for detection and/or diagnosis of SARS-CoV-2 by FDA under an Emergency Use Authorization (EUA). This EUA will remain  in effect (meaning this test can be used) for the duration of the COVID-19 declaration under Section 56 4(b)(1) of the Act, 21 U.S.C. section 360bbb-3(b)(1), unless the  authorization is terminated or revoked sooner. Performed at [facility], [place] [address] [place], [place] [address]   MRSA PCR Screening     Status: None   Collection Time: 03/26/19  1:29 AM   Specimen: Nasopharyngeal  Result Value Ref Range Status   MRSA by PCR NEGATIVE NEGATIVE Final    Comment:        The GeneXpert MRSA Assay (FDA approved for NASAL specimens only), is one component of a comprehensive MRSA colonization surveillance program. It is not intended to diagnose MRSA infection nor to guide or monitor treatment for MRSA infections. Performed at [facility], [place] [address], [place], [place] [address]     Labs: Results for orders placed or performed during the hospital encounter of 03/25/19 (from the past 48 hour(s))  Glucose, capillary     Status: Abnormal   Collection Time: 03/26/19  8:32 AM  Result Value Ref Range   Glucose-Capillary 170 (H) 70 - 99 mg/dL   Comment 1 Notify RN    Comment 2 Document in Chart    Comment 3 Glucose Stabilizer   Basic metabolic panel     Status: Abnormal   Collection Time: 03/26/19  8:44 AM  Result Value Ref Range   Sodium 144 135 - 145 mmol/L    Comment: DELTA CHECK NOTED  Potassium 4.4 3.5 - 5.1 mmol/L   Chloride 113 (H) 98 - 111 mmol/L   CO2 20 (L) 22 - 32 mmol/L   Glucose, Bld 188 (H) 70 - 99 mg/dL   BUN 32 (H) 6 - 20 mg/dL   Creatinine, Ser 1.61 (H) 0.44 - 1.00 mg/dL   Calcium 8.5 (L) 8.9 - 10.3 mg/dL   GFR calc non Af Amer 36 (L) >60 mL/min   GFR calc Af Amer 41 (L) >60 mL/min   Anion gap 11 5 - 15    Comment: Performed at [facility], [address] [place], [place] [address]  Glucose, capillary     Status: Abnormal   Collection Time: 03/26/19  9:36 AM  Result Value Ref Range   Glucose-Capillary 157 (H) 70 - 99 mg/dL   Comment 1 Notify RN    Comment 2 Document in Chart    Comment 3 Glucose Stabilizer   Glucose, capillary     Status: Abnormal   Collection Time:  03/26/19 10:37 AM  Result Value Ref Range   Glucose-Capillary 168 (H) 70 - 99 mg/dL   Comment 1 Notify RN    Comment 2 Document in Chart    Comment 3 Glucose Stabilizer   Glucose, capillary     Status: Abnormal   Collection Time: 03/26/19 11:48 AM  Result Value Ref Range   Glucose-Capillary 201 (H) 70 - 99 mg/dL  I-STAT 7, (LYTES, BLD GAS, ICA, H+H)     Status: Abnormal   Collection Time: 03/26/19  1:55 PM  Result Value Ref Range   pH, Arterial 7.321 (L) 7.350 - 7.450   pCO2 arterial 38.4 32.0 - 48.0 mmHg   pO2, Arterial 265.0 (H) 83.0 - 108.0 mmHg   Bicarbonate 19.8 (L) 20.0 - 28.0 mmol/L   TCO2 21 (L) 22 - 32 mmol/L   O2 Saturation 100.0 %   Acid-base deficit 6.0 (H) 0.0 - 2.0 mmol/L   Sodium 142 135 - 145 mmol/L   Potassium 4.1 3.5 - 5.1 mmol/L   Calcium, Ion 1.20 1.15 - 1.40 mmol/L   HCT 32.0 (L) 36.0 - 46.0 %   Hemoglobin 10.9 (L) 12.0 - 15.0 g/dL   Patient temperature HIDE    Sample type ARTERIAL   Glucose, capillary     Status: Abnormal   Collection Time: 03/26/19  1:57 PM  Result Value Ref Range   Glucose-Capillary 164 (H) 70 - 99 mg/dL  Glucose, capillary     Status: Abnormal   Collection Time: 03/26/19  3:49 PM  Result Value Ref Range   Glucose-Capillary 132 (H) 70 - 99 mg/dL  CBC     Status: Abnormal   Collection Time: 03/26/19  4:39 PM  Result Value Ref Range   WBC 20.8 (H) 4.0 - 10.5 K/uL   RBC 5.19 (H) 3.87 - 5.11 MIL/uL   Hemoglobin 14.4 12.0 - 15.0 g/dL   HCT 54.0 98.1 - 19.1 %   MCV 85.4 80.0 - 100.0 fL   MCH 27.7 26.0 - 34.0 pg   MCHC 32.5 30.0 - 36.0 g/dL   RDW 47.8 29.5 - 62.1 %   Platelets 286 150 - 400 K/uL   nRBC 0.1 0.0 - 0.2 %    Comment: Performed at [facility], [address] [place], [place] [address]  Creatinine, serum     Status: Abnormal   Collection Time: 03/26/19  4:39 PM  Result Value Ref Range   Creatinine, Ser 1.58 (H) 0.44 -  1.00 mg/dL   GFR calc non Af Amer 37 (L) >60 mL/min   GFR calc Af Amer 43 (L) >60  mL/min    Comment: Performed at [facility], [address], [place], [place] [address]  Glucose, capillary     Status: Abnormal   Collection Time: 03/26/19  4:54 PM  Result Value Ref Range   Glucose-Capillary 162 (H) 70 - 99 mg/dL   Comment 1 Notify RN    Comment 2 Document in Chart    Comment 3 Glucose Stabilizer   Glucose, capillary     Status: Abnormal   Collection Time: 03/26/19  7:37 PM  Result Value Ref Range   Glucose-Capillary 221 (H) 70 - 99 mg/dL   Comment 1 Notify RN    Comment 2 Document in Chart   Glucose, capillary     Status: Abnormal   Collection Time: 03/26/19 11:21 PM  Result Value Ref Range   Glucose-Capillary 311 (H) 70 - 99 mg/dL   Comment 1 Notify RN    Comment 2 Document in Chart   Glucose, capillary     Status: Abnormal   Collection Time: 03/27/19  3:25 AM  Result Value Ref Range   Glucose-Capillary 307 (H) 70 - 99 mg/dL   Comment 1 Notify RN    Comment 2 Document in Chart   Basic metabolic panel     Status: Abnormal   Collection Time: 03/27/19  4:30 AM  Result Value Ref Range   Sodium 140 135 - 145 mmol/L   Potassium 4.7 3.5 - 5.1 mmol/L   Chloride 112 (H) 98 - 111 mmol/L   CO2 16 (L) 22 - 32 mmol/L   Glucose, Bld 366 (H) 70 - 99 mg/dL   BUN 41 (H) 6 - 20 mg/dL   Creatinine, Ser 9.21 (H) 0.44 - 1.00 mg/dL   Calcium 7.8 (L) 8.9 - 10.3 mg/dL   GFR calc non Af Amer 36 (L) >60 mL/min   GFR calc Af Amer 42 (L) >60 mL/min   Anion gap 12 5 - 15    Comment: Performed at [facility], [address] [place], [place] [address]  CBC     Status: Abnormal   Collection Time: 03/27/19  4:30 AM  Result Value Ref Range   WBC 23.1 (H) 4.0 - 10.5 K/uL   RBC 4.03 3.87 - 5.11 MIL/uL   Hemoglobin 11.4 (L) 12.0 - 15.0 g/dL   HCT 40.8 (L) 14.4 - 81.8 %   MCV 87.6 80.0 - 100.0 fL   MCH 28.3 26.0 - 34.0 pg   MCHC 32.3 30.0 - 36.0 g/dL   RDW 56.3 14.9 - 70.2 %   Platelets 283 150 - 400 K/uL   nRBC 0.0 0.0 - 0.2 %     Comment: Performed at [facility], [address] [place], [place] [address]  Glucose, capillary     Status: Abnormal   Collection Time: 03/27/19  7:53 AM  Result Value Ref Range   Glucose-Capillary 328 (H) 70 - 99 mg/dL   Comment 1 Notify RN    Comment 2 Document in Chart   Glucose, capillary     Status: Abnormal   Collection Time: 03/27/19 11:37 AM  Result Value Ref Range   Glucose-Capillary 303 (H) 70 - 99 mg/dL   Comment 1 Notify RN    Comment 2 Document in Chart   Glucose, capillary     Status: Abnormal   Collection Time: 03/27/19  5:15 PM  Result Value Ref Range   Glucose-Capillary 273 (H) 70 - 99 mg/dL   Comment 1 Notify RN    Comment 2 Document in Chart   Glucose, capillary     Status: Abnormal   Collection Time: 03/27/19  7:37 PM  Result Value Ref Range   Glucose-Capillary 241 (H) 70 - 99 mg/dL   Comment 1 Notify RN    Comment 2 Document in Chart   Glucose, capillary     Status: Abnormal   Collection Time: 03/27/19 11:26 PM  Result Value Ref Range   Glucose-Capillary 266 (H) 70 - 99 mg/dL  Glucose, capillary     Status: Abnormal   Collection Time: 03/28/19  3:39 AM  Result Value Ref Range   Glucose-Capillary 224 (H) 70 - 99 mg/dL  Basic metabolic panel     Status: Abnormal   Collection Time: 03/28/19  6:22 AM  Result Value Ref Range   Sodium 142 135 - 145 mmol/L   Potassium 3.8 3.5 - 5.1 mmol/L    Comment: DELTA CHECK NOTED   Chloride 115 (H) 98 - 111 mmol/L   CO2 19 (L) 22 - 32 mmol/L   Glucose, Bld 246 (H) 70 - 99 mg/dL   BUN 39 (H) 6 - 20 mg/dL   Creatinine, Ser 1.611.29 (H) 0.44 - 1.00 mg/dL   Calcium 8.1 (L) 8.9 - 10.3 mg/dL   GFR calc non Af Amer 47 (L) >60 mL/min   GFR calc Af Amer 54 (L) >60 mL/min   Anion gap 8 5 - 15    Comment: Performed at [facility], [address] [place], [place] [address]  CBC     Status: Abnormal   Collection Time: 03/28/19  6:22 AM  Result Value Ref Range   WBC 22.8 (H) 4.0 - 10.5  K/uL   RBC 3.98 3.87 - 5.11 MIL/uL   Hemoglobin 11.0 (L) 12.0 - 15.0 g/dL   HCT 54.034.7 (L) 98.136.0 - 19.146.0 %   MCV 87.2 80.0 - 100.0 fL   MCH 27.6 26.0 - 34.0 pg   MCHC 31.7 30.0 - 36.0 g/dL   RDW 47.814.1 29.511.5 - 62.115.5 %   Platelets 278 150 - 400 K/uL   nRBC 0.0 0.0 - 0.2 %    Comment: Performed at [facility], [address] [place], [place] [address]    Imaging / Studies: No results found.  Medications / Allergies: per chart  Antibiotics: Anti-infectives (From admission, onward)   Start     Dose/Rate Route Frequency Ordered Stop   03/26/19 1300  ertapenem (INVANZ) 1,000 mg in sodium chloride 0.9 % 100 mL IVPB     1 g 200 mL/hr over 30 Minutes Intravenous On call to O.R. 03/26/19 1250 03/26/19 1351        Note: Portions of this report may have been transcribed using voice recognition software. Every effort was made to ensure accuracy; however, inadvertent computerized transcription errors may be present.   Any transcriptional errors that result from this process are unintentional.     Ardeth SportsmanSteven C. Billy Turvey, MD, FACS, MASCRS Gastrointestinal and Minimally Invasive Surgery    [address] [place], [place] [address] [phone] Main / Paging [phone] Fax

## 2019-03-28 NOTE — Progress Notes (Signed)
Inpatient Diabetes Program Recommendations  AACE/ADA: New Consensus Statement on Inpatient Glycemic Control (2015)  Target Ranges:  Prepandial:   less than 140 mg/dL      Peak postprandial:   less than 180 mg/dL (1-2 hours)      Critically ill patients:  140 - 180 mg/dL   Lab Results  Component Value Date   GLUCAP 213 (H) 03/28/2019   HGBA1C 13.6 (H) 03/26/2019    Review of Glycemic Control  Diabetes history: DM2 Outpatient Diabetes medications: glipizide 5 mg bid, metformin 1000 mg bid Current orders for Inpatient glycemic control: Lantus 10 units every day, Novolog 0-6 units Q4H  HgbA1C - 13.6% - Uncontrolled  Inpatient Diabetes Program Recommendations:     Increase Lantus to 16 units QD Increase Novolog to 0-9 units Q4H  When po intake starts, will likely need meal coverage insulin. Begin with Novolog 3 units tidwc if pt eats > 50% meal  Spoke with pt this afternoon about her glycemic control, HgbA1C of 13.6% and importance of controlling blood sugars to prevent complications from diabetes. Pt states she was on Victoza at one point, so she knows how to use a pen. Prefers insulin pen over syringe since pt will likely need to go home on insulin. Discussed hypoglycemia s/s and treatment. Demonstrated insulin pen administration. Pt was in pain and did not "teach back" insulin pen instruction. Seemed very surprised that she would likely be going home on insulin. States blood sugars are usually in mid to high 200s. Discussed importance of lifestyle modification with weight loss and exercising, and eating healthy diet. Pt states she had exercised in the past, but had not done anything recently. Checks blood sugars at home with meter occasionally. Will order Living Well book and OP Diabetes Education.  Will f/u in am.   Thank you. Lorenda Peck, RD, LDN, CDE Inpatient Diabetes Coordinator [phone]

## 2019-03-29 ENCOUNTER — Inpatient Hospital Stay: Payer: Self-pay

## 2019-03-29 DIAGNOSIS — E1165 Type 2 diabetes mellitus with hyperglycemia: Secondary | ICD-10-CM

## 2019-03-29 DIAGNOSIS — E669 Obesity, unspecified: Secondary | ICD-10-CM

## 2019-03-29 DIAGNOSIS — E118 Type 2 diabetes mellitus with unspecified complications: Secondary | ICD-10-CM

## 2019-03-29 DIAGNOSIS — I480 Paroxysmal atrial fibrillation: Secondary | ICD-10-CM

## 2019-03-29 DIAGNOSIS — K56600 Partial intestinal obstruction, unspecified as to cause: Secondary | ICD-10-CM

## 2019-03-29 DIAGNOSIS — N1831 Chronic kidney disease, stage 3a: Secondary | ICD-10-CM

## 2019-03-29 DIAGNOSIS — N179 Acute kidney failure, unspecified: Secondary | ICD-10-CM

## 2019-03-29 DIAGNOSIS — K56609 Unspecified intestinal obstruction, unspecified as to partial versus complete obstruction: Secondary | ICD-10-CM

## 2019-03-29 LAB — BASIC METABOLIC PANEL
Anion gap: 7 (ref 5–15)
BUN: 44 mg/dL — ABNORMAL HIGH (ref 6–20)
CO2: 20 mmol/L — ABNORMAL LOW (ref 22–32)
Calcium: 8.2 mg/dL — ABNORMAL LOW (ref 8.9–10.3)
Chloride: 118 mmol/L — ABNORMAL HIGH (ref 98–111)
Creatinine, Ser: 1.85 mg/dL — ABNORMAL HIGH (ref 0.44–1.00)
GFR calc Af Amer: 35 mL/min — ABNORMAL LOW (ref >60)
GFR calc non Af Amer: 30 mL/min — ABNORMAL LOW (ref >60)
Glucose, Bld: 171 mg/dL — ABNORMAL HIGH (ref 70–99)
Potassium: 4.2 mmol/L (ref 3.5–5.1)
Sodium: 145 mmol/L (ref 135–145)

## 2019-03-29 LAB — GLUCOSE, CAPILLARY
Glucose-Capillary: 129 mg/dL — ABNORMAL HIGH (ref 70–99)
Glucose-Capillary: 151 mg/dL — ABNORMAL HIGH (ref 70–99)
Glucose-Capillary: 156 mg/dL — ABNORMAL HIGH (ref 70–99)
Glucose-Capillary: 163 mg/dL — ABNORMAL HIGH (ref 70–99)
Glucose-Capillary: 170 mg/dL — ABNORMAL HIGH (ref 70–99)
Glucose-Capillary: 216 mg/dL — ABNORMAL HIGH (ref 70–99)

## 2019-03-29 MED ORDER — BOOST / RESOURCE BREEZE PO LIQD CUSTOM
1.0000 | Freq: Three times a day (TID) | ORAL | Status: DC
  Administered 2019-03-29 – 2019-03-31 (×3): 1 via ORAL

## 2019-03-29 MED ORDER — SODIUM CHLORIDE 0.9% FLUSH: 10.0000 mL | INTRAVENOUS | Status: DC | PRN

## 2019-03-29 MED ORDER — SODIUM CHLORIDE 0.9 % IV SOLN: INTRAVENOUS | Status: DC

## 2019-03-29 MED ORDER — SODIUM CHLORIDE 0.9% FLUSH: 10.0000 mL | Freq: Two times a day (BID) | INTRAVENOUS | Status: DC

## 2019-03-29 NOTE — Progress Notes (Signed)
     Assessment & Plan: POD#3 - status post ex lap with lysis of adhesions, 03/26/19 Dr. Harlow Asa  Clamp NG tube this AM, allow sips of clear liquids  OOB, mobilize  DM, HTN, Afib  Per medical service  OK for transfer to floor when stable from medical standpoint.        Armandina Gemma, MD       [facility]       Office: [phone]   Chief Complaint: Closed loop SBO  Subjective: Patient up in chair, some pain with movement.  May have passed gas yesterday, no BM's.  Objective: Vital signs in last 24 hours: Temp:  [97.8 F (36.6 C)-98.6 F (37 C)] 97.8 F (36.6 C) (12/23 0400) Pulse Rate:  [98-155] 107 (12/23 0700) Resp:  [7-20] 9 (12/23 0700) BP: (109-204)/(61-112) 133/65 (12/23 0700) SpO2:  [87 %-100 %] 99 % (12/23 0700) Arterial Line BP: (147-176)/(71-109) 172/76 (12/22 1400) Last BM Date: 03/26/19  Intake/Output from previous day: 12/22 0701 - 12/23 0700 In: 423.1 [P.O.:180; I.V.:120.4; IV Piggyback:122.7] Out: 1000 [Urine:250; Emesis/NG output:750] Intake/Output this shift: No intake/output data recorded.  Physical Exam: HEENT - sclerae clear, mucous membranes moist Neck - soft Abdomen - soft, BS present; dressing dry and intact; limited from NG tube Ext - no edema, non-tender Neuro - alert & oriented, no focal deficits  Lab Results:  Recent Labs    03/27/19 0430 03/28/19 0622  WBC 23.1* 22.8*  HGB 11.4* 11.0*  HCT 35.3* 34.7*  PLT 283 278   BMET Recent Labs    03/28/19 0622 03/29/19 0225  NA 142 145  K 3.8 4.2  CL 115* 118*  CO2 19* 20*  GLUCOSE 246* 171*  BUN 39* 44*  CREATININE 1.29* 1.85*  CALCIUM 8.1* 8.2*   PT/INR No results for input(s): LABPROT, INR in the last 72 hours. Comprehensive Metabolic Panel:    Component Value Date/Time   NA 145 03/29/2019 0225   NA 142 03/28/2019 0622   K 4.2 03/29/2019 0225   K 3.8 03/28/2019 0622   CL 118 (H) 03/29/2019 0225   CL 115 (H) 03/28/2019 0622   CO2 20 (L) 03/29/2019  0225   CO2 19 (L) 03/28/2019 0622   BUN 44 (H) 03/29/2019 0225   BUN 39 (H) 03/28/2019 0622   CREATININE 1.85 (H) 03/29/2019 0225   CREATININE 1.29 (H) 03/28/2019 0622   GLUCOSE 171 (H) 03/29/2019 0225   GLUCOSE 246 (H) 03/28/2019 0622   CALCIUM 8.2 (L) 03/29/2019 0225   CALCIUM 8.1 (L) 03/28/2019 0622   AST 19 03/25/2019 1533   AST 16 09/18/2018 0627   ALT 15 03/25/2019 1533   ALT 20 09/18/2018 0627   ALKPHOS 122 03/25/2019 1533   ALKPHOS 155 (H) 09/18/2018 0627   BILITOT 0.6 03/25/2019 1533   BILITOT 0.9 09/18/2018 0627   PROT 8.2 (H) 03/25/2019 1533   PROT 8.1 09/18/2018 0627   ALBUMIN 4.0 03/25/2019 1533   ALBUMIN 4.0 09/18/2018 0627    Studies/Results: No results found.    Armandina Gemma 03/29/2019  Patient ID: Dawn Braun, female   DOB: Mar 27, 1966, 53 y.o.   MRN: [id number]

## 2019-03-29 NOTE — Progress Notes (Signed)
eLink Physician-Brief Progress Note Patient Name: LEASIA SWANN DOB: 1965/06/03 MRN: [id number]   Date of Service  03/29/2019  HPI/Events of Note  PT midline catheter is not working, Pt meets renal criteria for PICC line placement.  eICU Interventions  PICC line ordered.        Kerry Kass Sung Parodi 03/29/2019, 9:40 PM

## 2019-03-29 NOTE — Progress Notes (Signed)
Assessed BLU with [place] for PIV placement. Poor vasculature, unable to place line at this time. Notified nurse. May consult for PICC assessment. VU. Fran Lowes, RN VAST

## 2019-03-29 NOTE — Progress Notes (Signed)
New midline not infusing, no blood return noted, swelling at site when flushed. Contacted on call for triad to request central line placement as well as PO medications until new line is placed. IV team said previously that patient was not the best candidate for PICC placement. All infusions stopped. Awaiting instructions.

## 2019-03-29 NOTE — Progress Notes (Signed)
On call placed new order for STAT central line placement. Contacted once again to make sure consult to CCM was placed by her. Awaiting response now.

## 2019-03-29 NOTE — Progress Notes (Signed)
Progress Note    Dawn Braun  OZH:[id number] DOB: 1965/07/03  DOA: 03/25/2019 PCP: Renford DillsPolite, [name], MD    HPI Dawn Braun is an 53 y.o. female with PMH of uncontrolled DM2, paroxysmal Afib and HTN who presented with a partial SBO and is admitted to the Gen Surgery team. TRH asked to consult for management of Afib, DM and HTN.    Assessment/Plan:   Principal Problem:   Closed loop SBO (small bowel obstruction) s/p exlap /lysis of adhesions 03/26/2019 Active Problems:   Diabetes mellitus type 2, uncontrolled, with complications (HCC)   Paroxysmal atrial fibrillation (HCC)   Asthma   AKI (acute kidney injury) (HCC)   Acute renal failure superimposed on stage 3 chronic kidney disease (HCC)   CKD (chronic kidney disease) stage 3, GFR 30-59 ml/min   Obesity (BMI 30-39.9)   Chronic anticoagulation   Closed-loop small bowel obstruction s/p ex lap with lysis of adhesions on 03/26/2019  Management per general surgery  Leukocytosis Afebrile, likely reactive post op Daily CBC  Type 2 diabetes mellitus Uncontrolled, Hemoglobin A1c 13.6 Continue SSI, accuchecks, Lantus, hypoglycemic protocol  AKI on CKD stage IIIa Cr now 1.85, baseline around 1.2-1.4 Started on gentle hydration Daily BMP  Paroxysmal atrial fibrillation with RVR Telemetry now shows sinus tachycardia Continue oral Cardizem, flecainide and metoprolol  Hypertension Continue cardizem, metoprolol, IV hydralazine as needed.    GERD Continue IV Protonix.  Obesity Lifestyle modification advised  Family Communication/Anticipated D/C date and plan/Code Status   DVT prophylaxis: Lovenox Code Status: Full code Family Communication: None at bedside Disposition Plan: To be determined      Subjective:   Reports some abdominal tenderness, denies any other complaints  Objective:    Vitals:   03/29/19 1551 03/29/19 1700 03/29/19 1954 03/29/19 2020  BP: (!) 181/89   (!) 168/104  Pulse:  (!) 108   (!) 109  Resp: (!) 27   14  Temp:  99 F (37.2 C) 99.3 F (37.4 C)   TempSrc:  Oral Oral   SpO2: 100%   100%  Weight:      Height:        Intake/Output Summary (Last 24 hours) at 03/29/2019 2114 Last data filed at 03/29/2019 1600 Gross per 24 hour  Intake 420 ml  Output 700 ml  Net -280 ml   Filed Weights   03/26/19 0054  Weight: 91.5 kg    Exam:  General: NAD   Cardiovascular: S1, S2 present  Respiratory: CTAB  Abdomen: Soft, mildly tender, nondistended, bowel sounds present, midline incision c/d/i  Musculoskeletal: Bilateral upper extremity edema noted  Skin: Normal  Psychiatry: Normal mood    Data Reviewed:   I have personally reviewed following labs and imaging studies:  Labs: Labs show the following:   Basic Metabolic Panel: Recent Labs  Lab 03/26/19 0004 03/26/19 0844 03/26/19 1355 03/26/19 1639 03/27/19 0430 03/28/19 0622 03/29/19 0225  NA 135 144 142  --  140 142 145  K 5.2* 4.4 4.1  --  4.7 3.8 4.2  CL 102 113*  --   --  112* 115* 118*  CO2 18* 20*  --   --  16* 19* 20*  GLUCOSE 576* 188*  --   --  366* 246* 171*  BUN 26* 32*  --   --  41* 39* 44*  CREATININE 1.34* 1.62*  --  1.58* 1.59* 1.29* 1.85*  CALCIUM 8.7* 8.5*  --   --  7.8* 8.1* 8.2*  GFR Estimated Creatinine Clearance: 36.6 mL/min (A) (by C-G formula based on SCr of 1.85 mg/dL (H)). Liver Function Tests: Recent Labs  Lab 03/25/19 1533  AST 19  ALT 15  ALKPHOS 122  BILITOT 0.6  PROT 8.2*  ALBUMIN 4.0   Recent Labs  Lab 03/25/19 1533  LIPASE 19   No results for input(s): AMMONIA in the last 168 hours. Coagulation profile No results for input(s): INR, PROTIME in the last 168 hours.  CBC: Recent Labs  Lab 03/25/19 1533 03/26/19 1355 03/26/19 1639 03/27/19 0430 03/28/19 0622  WBC 9.5  --  20.8* 23.1* 22.8*  NEUTROABS 5.6  --   --   --   --   HGB 13.9 10.9* 14.4 11.4* 11.0*  HCT 43.5 32.0* 44.3 35.3* 34.7*  MCV 85.8  --  85.4 87.6 87.2  PLT  370  --  286 283 278   Cardiac Enzymes: No results for input(s): CKTOTAL, CKMB, CKMBINDEX, TROPONINI in the last 168 hours. BNP (last 3 results) No results for input(s): PROBNP in the last 8760 hours. CBG: Recent Labs  Lab 03/29/19 0346 03/29/19 0748 03/29/19 1126 03/29/19 1621 03/29/19 1946  GLUCAP 151* 129* 170* 156* 216*   D-Dimer: No results for input(s): DDIMER in the last 72 hours. Hgb A1c: No results for input(s): HGBA1C in the last 72 hours. Lipid Profile: No results for input(s): CHOL, HDL, LDLCALC, TRIG, CHOLHDL, LDLDIRECT in the last 72 hours. Thyroid function studies: No results for input(s): TSH, T4TOTAL, T3FREE, THYROIDAB in the last 72 hours.  Invalid input(s): FREET3 Anemia work up: No results for input(s): VITAMINB12, FOLATE, FERRITIN, TIBC, IRON, RETICCTPCT in the last 72 hours. Sepsis Labs: Recent Labs  Lab 03/25/19 1533 03/26/19 1639 03/27/19 0430 03/28/19 0622  WBC 9.5 20.8* 23.1* 22.8*    Microbiology Recent Results (from the past 240 hour(s))  SARS CORONAVIRUS 2 (TAT 6-24 HRS) Nasopharyngeal Nasopharyngeal Swab     Status: None   Collection Time: 03/25/19  8:02 PM   Specimen: Nasopharyngeal Swab  Result Value Ref Range Status   SARS Coronavirus 2 NEGATIVE NEGATIVE Final    Comment: (NOTE) SARS-CoV-2 target nucleic acids are NOT DETECTED. The SARS-CoV-2 RNA is generally detectable in upper and lower respiratory specimens during the acute phase of infection. Negative results do not preclude SARS-CoV-2 infection, do not rule out co-infections with other pathogens, and should not be used as the sole basis for treatment or other patient management decisions. Negative results must be combined with clinical observations, patient history, and epidemiological information. The expected result is Negative. Fact Sheet for Patients: SugarRoll.be Fact Sheet for Healthcare  Providers: https://www.woods-mathews.com/ This test is not yet approved or cleared by the [place] FDA and  has been authorized for detection and/or diagnosis of SARS-CoV-2 by FDA under an Emergency Use Authorization (EUA). This EUA will remain  in effect (meaning this test can be used) for the duration of the COVID-19 declaration under Section 56 4(b)(1) of the Act, 21 U.S.C. section 360bbb-3(b)(1), unless the authorization is terminated or revoked sooner. Performed at [facility], [place] [address] [place], [place] [address]   MRSA PCR Screening     Status: None   Collection Time: 03/26/19  1:29 AM   Specimen: Nasopharyngeal  Result Value Ref Range Status   MRSA by PCR NEGATIVE NEGATIVE Final    Comment:        The GeneXpert MRSA Assay (FDA approved for NASAL specimens only), is one component of a comprehensive MRSA  colonization surveillance program. It is not intended to diagnose MRSA infection nor to guide or monitor treatment for MRSA infections. Performed at [facility], [address] [place], [place] [address]     Procedures and diagnostic studies:  [place] EKG SITE RITE  Result Date: 03/29/2019 If Site Rite image not attached, placement could not be confirmed due to current cardiac rhythm.   Medications:   . Chlorhexidine Gluconate Cloth  6 each Topical Daily  . enoxaparin (LOVENOX) injection  40 mg Subcutaneous Q24H  . feeding supplement  1 Container Oral TID BM  . flecainide  100 mg Per Tube Q12H  . insulin aspart  0-6 Units Subcutaneous Q4H  . insulin glargine  10 Units Subcutaneous Daily  . lip balm  1 application Topical BID  . pantoprazole (PROTONIX) IV  40 mg Intravenous QHS  . sodium chloride flush  10-40 mL Intracatheter Q12H   Continuous Infusions: . sodium chloride    . chlorproMAZINE (THORAZINE) IV Stopped (03/28/19 1639)  . lactated ringers    . methocarbamol (ROBAXIN) IV Stopped (03/28/19 1442)  .  sodium chloride       LOS: 4 days   Briant Cedar, MD  [facility]    03/29/2019, 9:14 PM

## 2019-03-30 LAB — GLUCOSE, CAPILLARY
Glucose-Capillary: 116 mg/dL — ABNORMAL HIGH (ref 70–99)
Glucose-Capillary: 160 mg/dL — ABNORMAL HIGH (ref 70–99)
Glucose-Capillary: 187 mg/dL — ABNORMAL HIGH (ref 70–99)
Glucose-Capillary: 183 mg/dL — ABNORMAL HIGH (ref 70–99)

## 2019-03-30 MED ORDER — HYDRALAZINE HCL 25 MG PO TABS
25.0000 mg | ORAL_TABLET | Freq: Three times a day (TID) | ORAL | Status: DC
  Administered 2019-03-30 (×2): 25 mg via ORAL
  Filled 2019-03-30 (×2): qty 1

## 2019-03-30 MED ORDER — PANTOPRAZOLE SODIUM 40 MG PO TBEC
40.0000 mg | DELAYED_RELEASE_TABLET | Freq: Every day | ORAL | Status: DC
  Administered 2019-03-30 – 2019-04-01 (×3): 40 mg via ORAL
  Filled 2019-03-30 (×3): qty 1

## 2019-03-30 MED ORDER — METOPROLOL TARTRATE 25 MG PO TABS
25.0000 mg | ORAL_TABLET | Freq: Two times a day (BID) | ORAL | Status: DC
  Administered 2019-03-30 – 2019-04-01 (×4): 25 mg via ORAL
  Filled 2019-03-30 (×4): qty 1

## 2019-03-30 MED ORDER — HYDROCODONE-ACETAMINOPHEN 5-325 MG PO TABS: 1.0000 | ORAL_TABLET | ORAL | Status: DC | PRN

## 2019-03-30 MED ORDER — FLECAINIDE ACETATE 100 MG PO TABS
100.0000 mg | ORAL_TABLET | Freq: Two times a day (BID) | ORAL | Status: DC
  Administered 2019-03-30 – 2019-04-01 (×5): 100 mg via ORAL
  Filled 2019-03-30 (×6): qty 1

## 2019-03-30 MED ORDER — ACETAMINOPHEN 160 MG/5ML PO SOLN: 650.0000 mg | ORAL | Status: DC | PRN

## 2019-03-30 MED ORDER — HYDRALAZINE HCL 25 MG PO TABS: 25.0000 mg | ORAL_TABLET | Freq: Three times a day (TID) | ORAL | Status: DC | PRN

## 2019-03-30 MED ORDER — HYDROMORPHONE HCL 1 MG/ML IJ SOLN: 1.0000 mg | INTRAMUSCULAR | Status: DC | PRN

## 2019-03-30 MED ORDER — HYDRALAZINE HCL 25 MG PO TABS
25.0000 mg | ORAL_TABLET | Freq: Three times a day (TID) | ORAL | Status: DC | PRN
  Administered 2019-03-30: 25 mg via ORAL
  Filled 2019-03-30: qty 1

## 2019-03-30 MED ORDER — APIXABAN 5 MG PO TABS
5.0000 mg | ORAL_TABLET | Freq: Two times a day (BID) | ORAL | Status: DC
  Administered 2019-03-30 – 2019-04-01 (×4): 5 mg via ORAL
  Filled 2019-03-30 (×4): qty 1

## 2019-03-30 MED ORDER — DILTIAZEM HCL ER COATED BEADS 120 MG PO CP24
120.0000 mg | ORAL_CAPSULE | Freq: Every day | ORAL | Status: DC
  Administered 2019-03-30 – 2019-04-01 (×3): 120 mg via ORAL
  Filled 2019-03-30 (×3): qty 1

## 2019-03-30 NOTE — Progress Notes (Signed)
[place] for Eliquis Indication: atrial fibrillation  No Known Allergies  Patient Measurements: Height: 5\' 2"  (157.5 cm) Weight: 205 lb 14.6 oz (93.4 kg) IBW/kg (Calculated) : 50.1 Heparin Dosing Weight:   Vital Signs: Temp: 98.6 F (37 C) (12/24 1229) Temp Source: Oral (12/24 1229) BP: 152/95 (12/24 1229) Pulse Rate: 106 (12/24 1229)  Labs: Recent Labs    03/28/19 0622 03/29/19 0225  HGB 11.0*  --   HCT 34.7*  --   PLT 278  --   CREATININE 1.29* 1.85*    Estimated Creatinine Clearance: 37 mL/min (A) (by C-G formula based on SCr of 1.85 mg/dL (H)).   Assessment: Patient is a 53 y.o F presented to the ED on 12/19 with c/o abdominal pain and n/v.  Abdominal CT showed findings consistent with partial small bowel obstruction.  She underwent exp lap with lysis of adhesion on 12/20.  Pharmacy was consulted to start Eliquis on 12/24 for afib.  - scr up 1.85 on 12/23 (crcl~37)   Plan:  - d/c lovenox 40 mg daily - Eliquis 5 mg bid for weight >60kg and age <8 y.o - monitor for s/s bleeding nd renal function  Zackeriah Kissler P 03/30/2019,4:05 PM

## 2019-03-30 NOTE — Progress Notes (Signed)
[facility] Office:  [phone] General Surgery Progress Note   LOS: 5 days  POD -  4 Days Post-Op  Chief Complaint: Abdominal pain  Assessment and Plan: 1.  EXPLORATORY LAPAROTOMY, LYSIS OF ADHESION - 03/26/2019 - [facility]  For adhesive band  On clear liquids - will advance diet  Plan transfer to floor, needs to ambulate, difficult IV access (so will hold further IV access attempts)   2.  DM  Will probably need insulin to go home - defer to medicine.  She sees Dr. Delfina Redwood as her PCP. 3.  HTN 4.  A Fib  On chronic anticoagulation 5.  CRF/ARF   Creatinine - 1.80 - 03/29/2019 6.  DVT prophylaxis - Lovenox   Principal Problem:   Closed loop SBO (small bowel obstruction) s/p exlap /lysis of adhesions 03/26/2019 Active Problems:   Diabetes mellitus type 2, uncontrolled, with complications (HCC)   Paroxysmal atrial fibrillation (HCC)   Asthma   AKI (acute kidney injury) ([place])   Acute renal failure superimposed on stage 3 chronic kidney disease (HCC)   CKD (chronic kidney disease) stage 3, GFR 30-59 ml/min   Obesity (BMI 30-39.9)   Chronic anticoagulation  Subjective:  Tolerating liquids and has had BM's.  Married.  Her husband.  She works Transport planner for [employer].  Objective:   Vitals:   03/30/19 0520 03/30/19 0800  BP: (!) 176/123   Pulse: (!) 113   Resp: 14   Temp:  99.6 F (37.6 C)  SpO2: 100%      Intake/Output from previous day:  12/23 0701 - 12/24 0700 In: 240 [P.O.:240] Out: 500 [Urine:500]  Intake/Output this shift:  No intake/output data recorded.   Physical Exam:   General: WN [name] who is alert and oriented.    HEENT: Normal. Pupils equal. .   Lungs: Clear.  IS = 1,100 cc   Abdomen: Soft.  Has BS.   Wound: Clean   Lab Results:    Recent Labs    03/28/19 0622  WBC 22.8*  HGB 11.0*  HCT 34.7*  PLT 278    BMET   Recent Labs    03/28/19 0622 03/29/19 0225  NA 142 145  K 3.8 4.2  CL 115* 118*  CO2 19* 20*   GLUCOSE 246* 171*  BUN 39* 44*  CREATININE 1.29* 1.85*  CALCIUM 8.1* 8.2*    PT/INR  No results for input(s): LABPROT, INR in the last 72 hours.  ABG  No results for input(s): PHART, HCO3 in the last 72 hours.  Invalid input(s): PCO2, PO2   Studies/Results:  [place] EKG SITE RITE  Result Date: 03/29/2019 If Site Rite image not attached, placement could not be confirmed due to current cardiac rhythm.  [place] EKG SITE RITE  Result Date: 03/29/2019 If Site Rite image not attached, placement could not be confirmed due to current cardiac rhythm.    Anti-infectives:   Anti-infectives (From admission, onward)   Start     Dose/Rate Route Frequency Ordered Stop   03/26/19 1300  ertapenem (INVANZ) 1,000 mg in sodium chloride 0.9 % 100 mL IVPB     1 g 200 mL/hr over 30 Minutes Intravenous On call to O.R. 03/26/19 1250 03/26/19 1351      Alphonsa Overall, MD, [facility] Surgery Office: [phone] 03/30/2019

## 2019-03-30 NOTE — Progress Notes (Signed)
Pt refused lab draw MD notified.

## 2019-03-30 NOTE — Progress Notes (Signed)
Remains without IV access.  Taking po's well, having BM's now.

## 2019-03-30 NOTE — Progress Notes (Signed)
eLink Physician-Brief Progress Note Patient Name: Dawn Braun DOB: 1965-10-19 MRN: [id number]   Date of Service  03/30/2019  HPI/Events of Note  Pt needs something for pain PRN, she is taking PO  eICU Interventions  Tylenol tabs 650 mg PO Q 4 hours PRN ordered, Protonix switched to PO.        Kerry Kass Kynli Chou 03/30/2019, 12:11 AM

## 2019-03-30 NOTE — Progress Notes (Signed)
Progress Note    Dawn Braun  [id number] DOB: 09-08-1965  DOA: 03/25/2019 PCP: Renford DillsPolite, [name], MD    HPI Dawn Braun is an 53 y.o. female with PMH of uncontrolled DM2, paroxysmal Afib and HTN who presented with a partial SBO and is admitted to the Gen Surgery team. TRH asked to consult for management of Afib, DM and HTN.    Assessment/Plan:   Principal Problem:   Closed loop SBO (small bowel obstruction) s/p exlap /lysis of adhesions 03/26/2019 Active Problems:   Diabetes mellitus type 2, uncontrolled, with complications (HCC)   Paroxysmal atrial fibrillation (HCC)   Asthma   AKI (acute kidney injury) (HCC)   Acute renal failure superimposed on stage 3 chronic kidney disease (HCC)   CKD (chronic kidney disease) stage 3, GFR 30-59 ml/min   Obesity (BMI 30-39.9)   Chronic anticoagulation   Closed-loop small bowel obstruction s/p ex lap with lysis of adhesions on 03/26/2019  Management per general surgery  Leukocytosis Afebrile, likely reactive post op Daily CBC  Type 2 diabetes mellitus Uncontrolled, Hemoglobin A1c 13.6 Continue SSI, accuchecks, Lantus, hypoglycemic protocol  AKI on CKD stage IIIa Cr now 1.85, baseline around 1.2-1.4 Started on gentle hydration, lost IV access Daily BMP  Paroxysmal atrial fibrillation with RVR Telemetry now shows sinus tachycardia Continue oral Cardizem, flecainide and metoprolol  Hypertension Continue cardizem, metoprolol, hydralazine as needed.    GERD Continue Protonix.  Obesity Lifestyle modification advised  Family Communication/Anticipated D/C date and plan/Code Status   DVT prophylaxis: Lovenox Code Status: Full code Family Communication: None at bedside Disposition Plan: To be determined      Subjective:   Patient noted to be a very hard stick, no labs done today and no IV access.  Switched home meds to p.o. as patient is currently tolerating p.o. and having BMs.  Patient denies any  new complaints.  Objective:    Vitals:   03/30/19 0830 03/30/19 0900 03/30/19 1138 03/30/19 1229  BP: (!) 182/81  (!) 164/78 (!) 152/95  Pulse:  (!) 105 (!) 110 (!) 106  Resp: 13 15 14 18   Temp:    98.6 F (37 C)  TempSrc:    Oral  SpO2:  97% 100% 100%  Weight:      Height:        Intake/Output Summary (Last 24 hours) at 03/30/2019 1549 Last data filed at 03/30/2019 1344 Gross per 24 hour  Intake 480 ml  Output 250 ml  Net 230 ml   Filed Weights   03/26/19 0054 03/30/19 0520  Weight: 91.5 kg 93.4 kg    Exam:  General: NAD   Cardiovascular: S1, S2 present  Respiratory: CTAB  Abdomen: Soft, nontender, nondistended, bowel sounds present, midline incision C/D/I  Musculoskeletal: Bilateral upper extremity edema noted  Skin: Normal  Psychiatry: Normal mood    Data Reviewed:   I have personally reviewed following labs and imaging studies:  Labs: Labs show the following:   Basic Metabolic Panel: Recent Labs  Lab 03/26/19 0004 03/26/19 0844 03/26/19 1355 03/26/19 1639 03/27/19 0430 03/28/19 0622 03/29/19 0225  NA 135 144 142  --  140 142 145  K 5.2* 4.4 4.1  --  4.7 3.8 4.2  CL 102 113*  --   --  112* 115* 118*  CO2 18* 20*  --   --  16* 19* 20*  GLUCOSE 576* 188*  --   --  366* 246* 171*  BUN 26* 32*  --   --  41* 39* 44*  CREATININE 1.34* 1.62*  --  1.58* 1.59* 1.29* 1.85*  CALCIUM 8.7* 8.5*  --   --  7.8* 8.1* 8.2*   GFR Estimated Creatinine Clearance: 37 mL/min (A) (by C-G formula based on SCr of 1.85 mg/dL (H)). Liver Function Tests: Recent Labs  Lab 03/25/19 1533  AST 19  ALT 15  ALKPHOS 122  BILITOT 0.6  PROT 8.2*  ALBUMIN 4.0   Recent Labs  Lab 03/25/19 1533  LIPASE 19   No results for input(s): AMMONIA in the last 168 hours. Coagulation profile No results for input(s): INR, PROTIME in the last 168 hours.  CBC: Recent Labs  Lab 03/25/19 1533 03/26/19 1355 03/26/19 1639 03/27/19 0430 03/28/19 0622  WBC 9.5  --   20.8* 23.1* 22.8*  NEUTROABS 5.6  --   --   --   --   HGB 13.9 10.9* 14.4 11.4* 11.0*  HCT 43.5 32.0* 44.3 35.3* 34.7*  MCV 85.8  --  85.4 87.6 87.2  PLT 370  --  286 283 278   Cardiac Enzymes: No results for input(s): CKTOTAL, CKMB, CKMBINDEX, TROPONINI in the last 168 hours. BNP (last 3 results) No results for input(s): PROBNP in the last 8760 hours. CBG: Recent Labs  Lab 03/29/19 1621 03/29/19 1946 03/29/19 2335 03/30/19 0344 03/30/19 0754  GLUCAP 156* 216* 163* 116* 160*   D-Dimer: No results for input(s): DDIMER in the last 72 hours. Hgb A1c: No results for input(s): HGBA1C in the last 72 hours. Lipid Profile: No results for input(s): CHOL, HDL, LDLCALC, TRIG, CHOLHDL, LDLDIRECT in the last 72 hours. Thyroid function studies: No results for input(s): TSH, T4TOTAL, T3FREE, THYROIDAB in the last 72 hours.  Invalid input(s): FREET3 Anemia work up: No results for input(s): VITAMINB12, FOLATE, FERRITIN, TIBC, IRON, RETICCTPCT in the last 72 hours. Sepsis Labs: Recent Labs  Lab 03/25/19 1533 03/26/19 1639 03/27/19 0430 03/28/19 0622  WBC 9.5 20.8* 23.1* 22.8*    Microbiology Recent Results (from the past 240 hour(s))  SARS CORONAVIRUS 2 (TAT 6-24 HRS) Nasopharyngeal Nasopharyngeal Swab     Status: None   Collection Time: 03/25/19  8:02 PM   Specimen: Nasopharyngeal Swab  Result Value Ref Range Status   SARS Coronavirus 2 NEGATIVE NEGATIVE Final    Comment: (NOTE) SARS-CoV-2 target nucleic acids are NOT DETECTED. The SARS-CoV-2 RNA is generally detectable in upper and lower respiratory specimens during the acute phase of infection. Negative results do not preclude SARS-CoV-2 infection, do not rule out co-infections with other pathogens, and should not be used as the sole basis for treatment or other patient management decisions. Negative results must be combined with clinical observations, patient history, and epidemiological information. The expected result  is Negative. Fact Sheet for Patients: SugarRoll.be Fact Sheet for Healthcare Providers: https://www.woods-mathews.com/ This test is not yet approved or cleared by the [place] FDA and  has been authorized for detection and/or diagnosis of SARS-CoV-2 by FDA under an Emergency Use Authorization (EUA). This EUA will remain  in effect (meaning this test can be used) for the duration of the COVID-19 declaration under Section 56 4(b)(1) of the Act, 21 U.S.C. section 360bbb-3(b)(1), unless the authorization is terminated or revoked sooner. Performed at [facility], [place] [address], [place], [place] [address]   MRSA PCR Screening     Status: None   Collection Time: 03/26/19  1:29 AM   Specimen: Nasopharyngeal  Result Value Ref Range Status   MRSA by PCR NEGATIVE NEGATIVE  Final    Comment:        The GeneXpert MRSA Assay (FDA approved for NASAL specimens only), is one component of a comprehensive MRSA colonization surveillance program. It is not intended to diagnose MRSA infection nor to guide or monitor treatment for MRSA infections. Performed at [facility], [address] [place], [place] [address]     Procedures and diagnostic studies:  [place] EKG SITE RITE  Result Date: 03/29/2019 If Site Rite image not attached, placement could not be confirmed due to current cardiac rhythm.  [place] EKG SITE RITE  Result Date: 03/29/2019 If Site Rite image not attached, placement could not be confirmed due to current cardiac rhythm.   Medications:   . enoxaparin (LOVENOX) injection  40 mg Subcutaneous Q24H  . feeding supplement  1 Container Oral TID BM  . flecainide  100 mg Oral Q12H  . hydrALAZINE  25 mg Oral Q8H  . insulin aspart  0-6 Units Subcutaneous Q4H  . insulin glargine  10 Units Subcutaneous Daily  . lip balm  1 application Topical BID  . pantoprazole  40 mg Oral Daily   Continuous Infusions: .  chlorproMAZINE (THORAZINE) IV Stopped (03/28/19 1639)  . methocarbamol (ROBAXIN) IV Stopped (03/28/19 1442)     LOS: 5 days   Briant Cedar, MD  [facility]    03/30/2019, 3:49 PM

## 2019-03-31 LAB — CBC WITH DIFFERENTIAL/PLATELET
Abs Immature Granulocytes: 0.16 10*3/uL — ABNORMAL HIGH (ref 0.00–0.07)
Basophils Absolute: 0 10*3/uL (ref 0.0–0.1)
Basophils Relative: 0 %
Eosinophils Absolute: 0.4 10*3/uL (ref 0.0–0.5)
Eosinophils Relative: 3 %
HCT: 30 % — ABNORMAL LOW (ref 36.0–46.0)
Hemoglobin: 9.4 g/dL — ABNORMAL LOW (ref 12.0–15.0)
Immature Granulocytes: 1 %
Lymphocytes Relative: 26 %
Lymphs Abs: 3.5 10*3/uL (ref 0.7–4.0)
MCH: 27.6 pg (ref 26.0–34.0)
MCHC: 31.3 g/dL (ref 30.0–36.0)
MCV: 88.2 fL (ref 80.0–100.0)
Monocytes Absolute: 1.1 10*3/uL — ABNORMAL HIGH (ref 0.1–1.0)
Monocytes Relative: 8 %
Neutro Abs: 8.2 10*3/uL — ABNORMAL HIGH (ref 1.7–7.7)
Neutrophils Relative %: 62 %
Platelets: 257 10*3/uL (ref 150–400)
RBC: 3.4 MIL/uL — ABNORMAL LOW (ref 3.87–5.11)
RDW: 13.8 % (ref 11.5–15.5)
WBC: 13.4 10*3/uL — ABNORMAL HIGH (ref 4.0–10.5)
nRBC: 0 % (ref 0.0–0.2)

## 2019-03-31 LAB — BASIC METABOLIC PANEL
Anion gap: 8 (ref 5–15)
BUN: 25 mg/dL — ABNORMAL HIGH (ref 6–20)
CO2: 18 mmol/L — ABNORMAL LOW (ref 22–32)
Calcium: 7.9 mg/dL — ABNORMAL LOW (ref 8.9–10.3)
Chloride: 114 mmol/L — ABNORMAL HIGH (ref 98–111)
Creatinine, Ser: 0.96 mg/dL (ref 0.44–1.00)
GFR calc Af Amer: >60 mL/min (ref >60)
GFR calc non Af Amer: >60 mL/min (ref >60)
Glucose, Bld: 195 mg/dL — ABNORMAL HIGH (ref 70–99)
Potassium: 4.4 mmol/L (ref 3.5–5.1)
Sodium: 140 mmol/L (ref 135–145)

## 2019-03-31 LAB — GLUCOSE, CAPILLARY
Glucose-Capillary: 145 mg/dL — ABNORMAL HIGH (ref 70–99)
Glucose-Capillary: 185 mg/dL — ABNORMAL HIGH (ref 70–99)
Glucose-Capillary: 187 mg/dL — ABNORMAL HIGH (ref 70–99)
Glucose-Capillary: 191 mg/dL — ABNORMAL HIGH (ref 70–99)
Glucose-Capillary: 196 mg/dL — ABNORMAL HIGH (ref 70–99)
Glucose-Capillary: 199 mg/dL — ABNORMAL HIGH (ref 70–99)
Glucose-Capillary: 216 mg/dL — ABNORMAL HIGH (ref 70–99)
Glucose-Capillary: 270 mg/dL — ABNORMAL HIGH (ref 70–99)

## 2019-03-31 NOTE — Progress Notes (Signed)
Progress Note    Dawn Braun  ZOX:[id number] DOB: Jul 13, 1965  DOA: 03/25/2019 PCP: Renford DillsPolite, [name], MD    HPI Dawn Braun is an 53 y.o. female with PMH of uncontrolled DM2, paroxysmal Afib and HTN who presented with a partial SBO and is admitted to the Gen Surgery team. TRH asked to consult for management of Afib, DM and HTN.    Assessment/Plan:   Principal Problem:   Closed loop SBO (small bowel obstruction) s/p exlap /lysis of adhesions 03/26/2019 Active Problems:   Diabetes mellitus type 2, uncontrolled, with complications (HCC)   Paroxysmal atrial fibrillation (HCC)   Asthma   AKI (acute kidney injury) (HCC)   Acute renal failure superimposed on stage 3 chronic kidney disease (HCC)   CKD (chronic kidney disease) stage 3, GFR 30-59 ml/min   Obesity (BMI 30-39.9)   Chronic anticoagulation   Closed-loop small bowel obstruction s/p ex lap with lysis of adhesions on 03/26/2019  Management per general surgery  Leukocytosis Improving Afebrile, likely reactive post op Daily CBC  Type 2 diabetes mellitus Uncontrolled, Hemoglobin A1c 13.6 Continue SSI, accuchecks, Lantus, hypoglycemic protocol Patient stated she is unable to afford her medications, case management consulted  AKI on CKD stage IIIa Resolved Cr back to baseline  Paroxysmal atrial fibrillation with RVR Rate controlled Continue oral Cardizem, flecainide and metoprolol  Hypertension Continue cardizem, metoprolol, hydralazine as needed.    GERD Continue Protonix.  Obesity Lifestyle modification advised  Family Communication/Anticipated D/C date and plan/Code Status   DVT prophylaxis: Lovenox Code Status: Full code Family Communication: None at bedside Disposition Plan: Likely home      Subjective:   Patient denies any new complaints, eager to eat stated she is very hungry, has been having BMs.  Denies any worsening abdominal pain, fever/chills, chest pain, shortness of  breath.  Patient stated she is unable to afford her Eliquis, reason why she stopped it.  Unsure if she is able to afford insulin  Objective:    Vitals:   03/31/19 0358 03/31/19 0419 03/31/19 0816 03/31/19 1244  BP: 130/66 (!) 157/74 136/70 134/80  Pulse: 69 87 71 66  Resp: 14 14 14 15   Temp: 98.1 F (36.7 C) 98.5 F (36.9 C) 98.7 F (37.1 C) 98.1 F (36.7 C)  TempSrc: Oral Oral Oral Oral  SpO2: 98% 98% 99% 100%  Weight:      Height:        Intake/Output Summary (Last 24 hours) at 03/31/2019 1522 Last data filed at 03/31/2019 0600 Gross per 24 hour  Intake 720 ml  Output 700 ml  Net 20 ml   Filed Weights   03/26/19 0054 03/30/19 0520  Weight: 91.5 kg 93.4 kg    Exam:  General: NAD   Cardiovascular: S1, S2 present  Respiratory: CTAB  Abdomen: Soft, nontender, nondistended, bowel sounds present, midline incision C/D/I  Musculoskeletal: No bilateral pedal edema noted  Skin: Normal  Psychiatry: Normal mood    Data Reviewed:   I have personally reviewed following labs and imaging studies:  Labs: Labs show the following:   Basic Metabolic Panel: Recent Labs  Lab 03/26/19 0844 03/26/19 0844 03/26/19 1355 03/26/19 1639 03/27/19 0430 03/28/19 0622 03/29/19 0225 03/31/19 0451  NA 144  --  142  --  140 142 145 140  K 4.4   < > 4.1  --  4.7 3.8 4.2 4.4  CL 113*  --   --   --  112* 115* 118* 114*  CO2 20*  --   --   --  16* 19* 20* 18*  GLUCOSE 188*  --   --   --  366* 246* 171* 195*  BUN 32*  --   --   --  41* 39* 44* 25*  CREATININE 1.62*  --   --  1.58* 1.59* 1.29* 1.85* 0.96  CALCIUM 8.5*  --   --   --  7.8* 8.1* 8.2* 7.9*   < > = values in this interval not displayed.   GFR Estimated Creatinine Clearance: 71.3 mL/min (by C-G formula based on SCr of 0.96 mg/dL). Liver Function Tests: Recent Labs  Lab 03/25/19 1533  AST 19  ALT 15  ALKPHOS 122  BILITOT 0.6  PROT 8.2*  ALBUMIN 4.0   Recent Labs  Lab 03/25/19 1533  LIPASE 19   No  results for input(s): AMMONIA in the last 168 hours. Coagulation profile No results for input(s): INR, PROTIME in the last 168 hours.  CBC: Recent Labs  Lab 03/25/19 1533 03/26/19 1355 03/26/19 1639 03/27/19 0430 03/28/19 0622 03/31/19 0451  WBC 9.5  --  20.8* 23.1* 22.8* 13.4*  NEUTROABS 5.6  --   --   --   --  8.2*  HGB 13.9 10.9* 14.4 11.4* 11.0* 9.4*  HCT 43.5 32.0* 44.3 35.3* 34.7* 30.0*  MCV 85.8  --  85.4 87.6 87.2 88.2  PLT 370  --  286 283 278 257   Cardiac Enzymes: No results for input(s): CKTOTAL, CKMB, CKMBINDEX, TROPONINI in the last 168 hours. BNP (last 3 results) No results for input(s): PROBNP in the last 8760 hours. CBG: Recent Labs  Lab 03/30/19 1949 03/30/19 2357 03/31/19 0356 03/31/19 0800 03/31/19 1211  GLUCAP 185* 196* 191* 145* 216*   D-Dimer: No results for input(s): DDIMER in the last 72 hours. Hgb A1c: No results for input(s): HGBA1C in the last 72 hours. Lipid Profile: No results for input(s): CHOL, HDL, LDLCALC, TRIG, CHOLHDL, LDLDIRECT in the last 72 hours. Thyroid function studies: No results for input(s): TSH, T4TOTAL, T3FREE, THYROIDAB in the last 72 hours.  Invalid input(s): FREET3 Anemia work up: No results for input(s): VITAMINB12, FOLATE, FERRITIN, TIBC, IRON, RETICCTPCT in the last 72 hours. Sepsis Labs: Recent Labs  Lab 03/26/19 1639 03/27/19 0430 03/28/19 0622 03/31/19 0451  WBC 20.8* 23.1* 22.8* 13.4*    Microbiology Recent Results (from the past 240 hour(s))  SARS CORONAVIRUS 2 (TAT 6-24 HRS) Nasopharyngeal Nasopharyngeal Swab     Status: None   Collection Time: 03/25/19  8:02 PM   Specimen: Nasopharyngeal Swab  Result Value Ref Range Status   SARS Coronavirus 2 NEGATIVE NEGATIVE Final    Comment: (NOTE) SARS-CoV-2 target nucleic acids are NOT DETECTED. The SARS-CoV-2 RNA is generally detectable in upper and lower respiratory specimens during the acute phase of infection. Negative results do not preclude  SARS-CoV-2 infection, do not rule out co-infections with other pathogens, and should not be used as the sole basis for treatment or other patient management decisions. Negative results must be combined with clinical observations, patient history, and epidemiological information. The expected result is Negative. Fact Sheet for Patients: HairSlick.no Fact Sheet for Healthcare Providers: quierodirigir.com This test is not yet approved or cleared by the [place] FDA and  has been authorized for detection and/or diagnosis of SARS-CoV-2 by FDA under an Emergency Use Authorization (EUA). This EUA will remain  in effect (meaning this test can be used) for the duration of the COVID-19 declaration under  Section 56 4(b)(1) of the Act, 21 U.S.C. section 360bbb-3(b)(1), unless the authorization is terminated or revoked sooner. Performed at [facility], [place] [address] [place], [place] [address]   MRSA PCR Screening     Status: None   Collection Time: 03/26/19  1:29 AM   Specimen: Nasopharyngeal  Result Value Ref Range Status   MRSA by PCR NEGATIVE NEGATIVE Final    Comment:        The GeneXpert MRSA Assay (FDA approved for NASAL specimens only), is one component of a comprehensive MRSA colonization surveillance program. It is not intended to diagnose MRSA infection nor to guide or monitor treatment for MRSA infections. Performed at [facility], [place] [address], [place], [place] [address]     Procedures and diagnostic studies:  [place] EKG SITE RITE  Result Date: 03/29/2019 If Site Rite image not attached, placement could not be confirmed due to current cardiac rhythm.  [place] EKG SITE RITE  Result Date: 03/29/2019 If Site Rite image not attached, placement could not be confirmed due to current cardiac rhythm.   Medications:   . apixaban  5 mg Oral BID  . diltiazem  120 mg Oral Daily  . feeding  supplement  1 Container Oral TID BM  . flecainide  100 mg Oral Q12H  . insulin aspart  0-6 Units Subcutaneous Q4H  . insulin glargine  10 Units Subcutaneous Daily  . lip balm  1 application Topical BID  . metoprolol tartrate  25 mg Oral BID  . pantoprazole  40 mg Oral Daily   Continuous Infusions: . chlorproMAZINE (THORAZINE) IV Stopped (03/28/19 1639)  . methocarbamol (ROBAXIN) IV Stopped (03/28/19 1442)     LOS: 6 days   Alma Friendly, MD  [facility]    03/31/2019, 3:22 PM

## 2019-03-31 NOTE — Progress Notes (Signed)
Pt has found new neck marking on her since admission and unsure of origin. She states its tender to touch. RN advised patient to discuss with MD in the morning. Pt agreed. No further needs expressed.

## 2019-03-31 NOTE — Progress Notes (Signed)
5 Days Post-Op   Subjective/Chief Complaint: Tolerating full liquids Having BM's   Objective: Vital signs in last 24 hours: Temp:  [98.1 F (36.7 C)-99.6 F (37.6 C)] 98.7 F (37.1 C) (12/25 0816) Pulse Rate:  [69-110] 71 (12/25 0816) Resp:  [14-18] 14 (12/25 0816) BP: (127-164)/(66-95) 136/70 (12/25 0816) SpO2:  [98 %-100 %] 99 % (12/25 0816) Last BM Date: 03/30/19  Intake/Output from previous day: 12/24 0701 - 12/25 0700 In: 960 [P.O.:960] Out: 700 [Urine:700] Intake/Output this shift: No intake/output data recorded.  Exam: Awake and alert Looks comfortable Abdomen soft, dressing intact  Lab Results:  Recent Labs    03/31/19 0451  WBC 13.4*  HGB 9.4*  HCT 30.0*  PLT 257   BMET Recent Labs    03/29/19 0225 03/31/19 0451  NA 145 140  K 4.2 4.4  CL 118* 114*  CO2 20* 18*  GLUCOSE 171* 195*  BUN 44* 25*  CREATININE 1.85* 0.96  CALCIUM 8.2* 7.9*   PT/INR No results for input(s): LABPROT, INR in the last 72 hours. ABG No results for input(s): PHART, HCO3 in the last 72 hours.  Invalid input(s): PCO2, PO2  Studies/Results: [place] EKG SITE RITE  Result Date: 03/29/2019 If Site Rite image not attached, placement could not be confirmed due to current cardiac rhythm.  [place] EKG SITE RITE  Result Date: 03/29/2019 If Site Rite image not attached, placement could not be confirmed due to current cardiac rhythm.   Anti-infectives: Anti-infectives (From admission, onward)   Start     Dose/Rate Route Frequency Ordered Stop   03/26/19 1300  ertapenem (INVANZ) 1,000 mg in sodium chloride 0.9 % 100 mL IVPB     1 g 200 mL/hr over 30 Minutes Intravenous On call to O.R. 03/26/19 1250 03/26/19 1351      Assessment/Plan: s/p Procedure(s): EXPLORATORY LAPAROTOMY (N/A) LYSIS OF ADHESION (N/A)  Advance to carb modified diet May be ready for discharge tomorrow. Medical recommendations for home diabetic management   LOS: 6 days    Coralie Keens 03/31/2019

## 2019-04-01 LAB — CBC WITH DIFFERENTIAL/PLATELET
Abs Immature Granulocytes: 0.23 10*3/uL — ABNORMAL HIGH (ref 0.00–0.07)
Basophils Absolute: 0 10*3/uL (ref 0.0–0.1)
Basophils Relative: 0 %
Eosinophils Absolute: 0.4 10*3/uL (ref 0.0–0.5)
Eosinophils Relative: 4 %
HCT: 35.2 % — ABNORMAL LOW (ref 36.0–46.0)
Hemoglobin: 11.1 g/dL — ABNORMAL LOW (ref 12.0–15.0)
Immature Granulocytes: 2 %
Lymphocytes Relative: 35 %
Lymphs Abs: 3.5 10*3/uL (ref 0.7–4.0)
MCH: 27.8 pg (ref 26.0–34.0)
MCHC: 31.5 g/dL (ref 30.0–36.0)
MCV: 88 fL (ref 80.0–100.0)
Monocytes Absolute: 1.1 10*3/uL — ABNORMAL HIGH (ref 0.1–1.0)
Monocytes Relative: 11 %
Neutro Abs: 4.7 10*3/uL (ref 1.7–7.7)
Neutrophils Relative %: 48 %
Platelets: 222 10*3/uL (ref 150–400)
RBC: 4 MIL/uL (ref 3.87–5.11)
RDW: 13.5 % (ref 11.5–15.5)
WBC: 9.9 10*3/uL (ref 4.0–10.5)
nRBC: 0 % (ref 0.0–0.2)

## 2019-04-01 LAB — GLUCOSE, CAPILLARY
Glucose-Capillary: 204 mg/dL — ABNORMAL HIGH (ref 70–99)
Glucose-Capillary: 154 mg/dL — ABNORMAL HIGH (ref 70–99)
Glucose-Capillary: 175 mg/dL — ABNORMAL HIGH (ref 70–99)

## 2019-04-01 LAB — PROTIME-INR
INR: 1.3 — ABNORMAL HIGH (ref 0.8–1.2)
Prothrombin Time: 15.9 seconds — ABNORMAL HIGH (ref 11.4–15.2)

## 2019-04-01 MED ORDER — RIVAROXABAN 20 MG PO TABS: 20.0000 mg | ORAL_TABLET | Freq: Every day | ORAL | 0 refills | Status: DC

## 2019-04-01 MED ORDER — NOVOLIN 70/30 FLEXPEN (70-30) 100 UNIT/ML ~~LOC~~ SUPN: 9.0000 [IU] | PEN_INJECTOR | Freq: Two times a day (BID) | SUBCUTANEOUS | 0 refills | Status: DC

## 2019-04-01 MED ORDER — COUMADIN BOOK
Freq: Once | Status: DC
  Filled 2019-04-01: qty 1

## 2019-04-01 MED ORDER — INSULIN PEN NEEDLE 31G X 4 MM MISC: 1.0000 | Freq: Two times a day (BID) | 0 refills | Status: DC

## 2019-04-01 MED ORDER — FLECAINIDE ACETATE 100 MG PO TABS: 100.0000 mg | ORAL_TABLET | Freq: Two times a day (BID) | ORAL | 0 refills | Status: DC

## 2019-04-01 MED ORDER — DILTIAZEM HCL ER COATED BEADS 120 MG PO CP24: 120.0000 mg | ORAL_CAPSULE | Freq: Every day | ORAL | 0 refills | Status: DC

## 2019-04-01 MED ORDER — RIVAROXABAN (XARELTO) EDUCATION KIT FOR AFIB PATIENTS
PACK | Freq: Once | Status: AC
  Filled 2019-04-01: qty 1

## 2019-04-01 MED ORDER — METOPROLOL TARTRATE 25 MG PO TABS: 25.0000 mg | ORAL_TABLET | Freq: Two times a day (BID) | ORAL | 0 refills | Status: DC

## 2019-04-01 NOTE — TOC Initial Note (Signed)
Transition of Care [facility]) - Initial/Assessment Note    Patient Details  Name: Dawn Braun MRN: [id number] Date of Birth: March 22, 1966  Transition of Care (TOC) CM/SW Contact:    Joaquin Courts, RN Phone Number: 04/01/2019, 8:33 AM  Clinical Narrative:    CM noted consult for medication assistance. Patient has [employer] are 3-4$. No further needs identified, will sign off.                       Patient Goals and CMS Choice        Expected Discharge Plan and Services                                                Prior Living Arrangements/Services                       Activities of Daily Living Home Assistive Devices/Equipment: Contact lenses, Eyeglasses, CBG Meter ADL Screening (condition at time of admission) Patient's cognitive ability adequate to safely complete daily activities?: Yes Is the patient deaf or have difficulty hearing?: No Does the patient have difficulty seeing, even when wearing glasses/contacts?: No Does the patient have difficulty concentrating, remembering, or making decisions?: No Patient able to express need for assistance with ADLs?: Yes Does the patient have difficulty dressing or bathing?: Yes(secondary to surgery) Independently performs ADLs?: No(secondary to abdominal surgery) Communication: Independent Dressing (OT): Needs assistance Is this a change from baseline?: Change from baseline, expected to last >3 days Grooming: Needs assistance Is this a change from baseline?: Change from baseline, expected to last >3 days Feeding: Independent Bathing: Needs assistance Is this a change from baseline?: Change from baseline, expected to last >3 days Toileting: Needs assistance Is this a change from baseline?: Change from baseline, expected to last >3days In/Out Bed: Needs assistance Is this a change from baseline?: Change from baseline, expected to last >3 days Walks in Home: Needs  assistance Is this a change from baseline?: Change from baseline, expected to last >3 days Does the patient have difficulty walking or climbing stairs?: Yes(secondary to weakness) Weakness of Legs: Both Weakness of Arms/Hands: None  Permission Sought/Granted                  Emotional Assessment              Admission diagnosis:  Small bowel obstruction ([place]) [K56.609] Encounter for imaging study to confirm nasogastric (NG) tube placement [Z01.89] Partial small bowel obstruction ([place]) [K56.600] Patient Active Problem List   Diagnosis Date Noted  . CKD (chronic kidney disease) stage 3, GFR 30-59 ml/min 03/28/2019  . Obesity (BMI 30-39.9) 03/28/2019  . Chronic anticoagulation 03/28/2019  . Closed loop SBO (small bowel obstruction) s/p exlap /lysis of adhesions 03/26/2019 03/25/2019  . Acute renal failure superimposed on stage 3 chronic kidney disease ([place]) 09/19/2018  . DKA (diabetic ketoacidoses) ([place]) 09/18/2018  . Leukocytosis 05/17/2017  . Hypokalemia 01/02/2017  . SIRS (systemic inflammatory response syndrome) ([place]) 12/31/2016  . Hemoperitoneum 12/31/2016  . Intractable nausea and vomiting 12/31/2016  . AKI (acute kidney injury) ([place]) 12/31/2016  . Paroxysmal atrial fibrillation (HCC)   . Asthma   . Diabetes mellitus type 2, uncontrolled, with complications ([place]) 40/98/1191  . Menopausal symptom 05/01/2016   PCP:  Seward Carol, MD Pharmacy:   [facility] [place], [place] [place] [address] Phone: [phone] Fax: [phone]     Social Determinants of Health (SDOH) Interventions    Readmission Risk Interventions No flowsheet data found.

## 2019-04-01 NOTE — Progress Notes (Signed)
Progress Note    Dawn Braun  ZOX:[id number] DOB: 01/19/1966  DOA: 03/25/2019 PCP: Renford DillsPolite, [name], MD    HPI Dawn Braun is an 53 y.o. female with PMH of uncontrolled DM2, paroxysmal Afib and HTN who presented with a partial SBO and is admitted to the Gen Surgery team. TRH asked to consult for management of Afib, DM and HTN.    Assessment/Plan:   Principal Problem:   Closed loop SBO (small bowel obstruction) s/p exlap /lysis of adhesions 03/26/2019 Active Problems:   Diabetes mellitus type 2, uncontrolled, with complications (HCC)   Paroxysmal atrial fibrillation (HCC)   Asthma   AKI (acute kidney injury) (HCC)   Acute renal failure superimposed on stage 3 chronic kidney disease (HCC)   CKD (chronic kidney disease) stage 3, GFR 30-59 ml/min   Obesity (BMI 30-39.9)   Chronic anticoagulation   Closed-loop small bowel obstruction s/p ex lap with lysis of adhesions on 03/26/2019  Management per general surgery  Leukocytosis Resolved Afebrile, likely reactive post op Daily CBC  Type 2 diabetes mellitus Uncontrolled, Hemoglobin A1c 13.6 Discussed in details with diabetes coordinator, case manager about the most affordable suitable regimen for her diabetes Discharge patient on 70/30 9 units twice daily, patient stated she will be able to afford this.  [facility] and [facility] care with [facility] for further management  AKI on CKD stage IIIa Resolved Cr back to baseline  Paroxysmal atrial fibrillation with RVR Rate controlled Continue oral Cardizem, flecainide and metoprolol Patient also unable to afford her Eliquis and has used up her coupon.  Discussed with pharmacy, case manager about the most affordable suitable regimen for her, recommend Xarelto with a 30-day free card given to her by pharmacy.  Patient advised to make an appointment with [facility] for further  management.  Warfarin with Lovenox bridging was another option, but patient has no way to check her INR within 48 hours and also may not be able to afford enoxaparin. Discharge patient on Xarelto  Hypertension Continue cardizem, metoprolol  Obesity Lifestyle modification advised    [facility] will sign off today, stable for discharge from my standpoint      Family Communication/Anticipated D/C date and plan/Code Status   DVT prophylaxis: Xarelto Code Status: Full code Family Communication: None at bedside Disposition Plan: Home      Subjective:   Patient denies any new complaints, except for a superficial abrasion/scratch noted on the anterior aspect of her neck.  Appears to be healing.  No signs of any infection.  Patient denies any chest pain, abdominal pain, nausea/vomiting, fever/chills.  Patient having BMs unable to tolerate orally  Objective:    Vitals:   04/01/19 0138 04/01/19 0546 04/01/19 0841 04/01/19 1424  BP: 119/61 116/65 134/80 118/68  Pulse: 67 67 68 69  Resp: 16 16  16   Temp: 98.7 F (37.1 C) 98.6 F (37 C)  98.5 F (36.9 C)  TempSrc: Oral Oral  Oral  SpO2: 100% 100%  100%  Weight:      Height:        Intake/Output Summary (Last 24 hours) at 04/01/2019 1446 Last data filed at 04/01/2019 1349 Gross per 24 hour  Intake 360 ml  Output 0 ml  Net 360 ml   Filed Weights   03/26/19 0054 03/30/19 0520  Weight: 91.5 kg 93.4 kg    Exam:  General: NAD   Cardiovascular: S1,  S2 present  Respiratory: CTAB  Abdomen: Soft, nontender, nondistended, bowel sounds present, midline incision C/D/I  Musculoskeletal: No bilateral pedal edema noted  Skin: Normal  Psychiatry: Normal mood    Data Reviewed:   I have personally reviewed following labs and imaging studies:  Labs: Labs show the following:   Basic Metabolic Panel: Recent Labs  Lab 03/26/19 0844 03/26/19 0844 03/26/19 1355 03/26/19 1639 03/27/19 0430 03/28/19 0622  03/29/19 0225 03/31/19 0451  NA 144  --  142  --  140 142 145 140  K 4.4   < > 4.1  --  4.7 3.8 4.2 4.4  CL 113*  --   --   --  112* 115* 118* 114*  CO2 20*  --   --   --  16* 19* 20* 18*  GLUCOSE 188*  --   --   --  366* 246* 171* 195*  BUN 32*  --   --   --  41* 39* 44* 25*  CREATININE 1.62*  --   --  1.58* 1.59* 1.29* 1.85* 0.96  CALCIUM 8.5*  --   --   --  7.8* 8.1* 8.2* 7.9*   < > = values in this interval not displayed.   GFR Estimated Creatinine Clearance: 71.3 mL/min (by C-G formula based on SCr of 0.96 mg/dL). Liver Function Tests: Recent Labs  Lab 03/25/19 1533  AST 19  ALT 15  ALKPHOS 122  BILITOT 0.6  PROT 8.2*  ALBUMIN 4.0   Recent Labs  Lab 03/25/19 1533  LIPASE 19   No results for input(s): AMMONIA in the last 168 hours. Coagulation profile Recent Labs  Lab 04/01/19 1410  INR 1.3*    CBC: Recent Labs  Lab 03/25/19 1533 03/26/19 1639 03/27/19 0430 03/28/19 0622 03/31/19 0451 04/01/19 0449  WBC 9.5 20.8* 23.1* 22.8* 13.4* 9.9  NEUTROABS 5.6  --   --   --  8.2* 4.7  HGB 13.9 14.4 11.4* 11.0* 9.4* 11.1*  HCT 43.5 44.3 35.3* 34.7* 30.0* 35.2*  MCV 85.8 85.4 87.6 87.2 88.2 88.0  PLT 370 286 283 278 257 222   Cardiac Enzymes: No results for input(s): CKTOTAL, CKMB, CKMBINDEX, TROPONINI in the last 168 hours. BNP (last 3 results) No results for input(s): PROBNP in the last 8760 hours. CBG: Recent Labs  Lab 03/31/19 2013 03/31/19 2338 04/01/19 0338 04/01/19 0759 04/01/19 1150  GLUCAP 270* 187* 204* 154* 175*   D-Dimer: No results for input(s): DDIMER in the last 72 hours. Hgb A1c: No results for input(s): HGBA1C in the last 72 hours. Lipid Profile: No results for input(s): CHOL, HDL, LDLCALC, TRIG, CHOLHDL, LDLDIRECT in the last 72 hours. Thyroid function studies: No results for input(s): TSH, T4TOTAL, T3FREE, THYROIDAB in the last 72 hours.  Invalid input(s): FREET3 Anemia work up: No results for input(s): VITAMINB12, FOLATE,  FERRITIN, TIBC, IRON, RETICCTPCT in the last 72 hours. Sepsis Labs: Recent Labs  Lab 03/27/19 0430 03/28/19 0622 03/31/19 0451 04/01/19 0449  WBC 23.1* 22.8* 13.4* 9.9    Microbiology Recent Results (from the past 240 hour(s))  SARS CORONAVIRUS 2 (TAT 6-24 HRS) Nasopharyngeal Nasopharyngeal Swab     Status: None   Collection Time: 03/25/19  8:02 PM   Specimen: Nasopharyngeal Swab  Result Value Ref Range Status   SARS Coronavirus 2 NEGATIVE NEGATIVE Final    Comment: (NOTE) SARS-CoV-2 target nucleic acids are NOT DETECTED. The SARS-CoV-2 RNA is generally detectable in upper and lower respiratory specimens during the acute phase  of infection. Negative results do not preclude SARS-CoV-2 infection, do not rule out co-infections with other pathogens, and should not be used as the sole basis for treatment or other patient management decisions. Negative results must be combined with clinical observations, patient history, and epidemiological information. The expected result is Negative. Fact Sheet for Patients: SugarRoll.be Fact Sheet for Healthcare Providers: https://www.woods-mathews.com/ This test is not yet approved or cleared by the [place] FDA and  has been authorized for detection and/or diagnosis of SARS-CoV-2 by FDA under an Emergency Use Authorization (EUA). This EUA will remain  in effect (meaning this test can be used) for the duration of the COVID-19 declaration under Section 56 4(b)(1) of the Act, 21 U.S.C. section 360bbb-3(b)(1), unless the authorization is terminated or revoked sooner. Performed at [facility], [place] [address], [place], [place] [address]   MRSA PCR Screening     Status: None   Collection Time: 03/26/19  1:29 AM   Specimen: Nasopharyngeal  Result Value Ref Range Status   MRSA by PCR NEGATIVE NEGATIVE Final    Comment:        The GeneXpert MRSA Assay (FDA approved for NASAL specimens only),  is one component of a comprehensive MRSA colonization surveillance program. It is not intended to diagnose MRSA infection nor to guide or monitor treatment for MRSA infections. Performed at [facility], [place] [address], [place],  [address]     Procedures and diagnostic studies:  No results found.  Medications:   . diltiazem  120 mg Oral Daily  . feeding supplement  1 Container Oral TID BM  . flecainide  100 mg Oral Q12H  . insulin aspart  0-6 Units Subcutaneous Q4H  . insulin glargine  10 Units Subcutaneous Daily  . lip balm  1 application Topical BID  . metoprolol tartrate  25 mg Oral BID  . pantoprazole  40 mg Oral Daily  . rivaroxaban   Does not apply Once   Continuous Infusions: . chlorproMAZINE (THORAZINE) IV Stopped (03/28/19 1639)  . methocarbamol (ROBAXIN) IV Stopped (03/28/19 1442)     LOS: 7 days   Alma Friendly, MD  [facility]    04/01/2019, 2:46 PM

## 2019-04-01 NOTE — Discharge Summary (Signed)
Physician Discharge Summary  Patient ID: Dawn Braun MRN: [id number] DOB/AGE: Oct 28, 1965 53 y.o.  Admit date: 03/25/2019 Discharge date: 04/01/2019  Admission Diagnoses:  Discharge Diagnoses:  Principal Problem:   Closed loop SBO (small bowel obstruction) s/p exlap /lysis of adhesions 03/26/2019 Active Problems:   Diabetes mellitus type 2, uncontrolled, with complications (HCC)   Paroxysmal atrial fibrillation (HCC)   Asthma   AKI (acute kidney injury) (HCC)   Acute renal failure superimposed on stage 3 chronic kidney disease (HCC)   CKD (chronic kidney disease) stage 3, GFR 30-59 ml/min   Obesity (BMI 30-39.9)   Chronic anticoagulation   Discharged Condition: good  Hospital Course: patient admitted with abdominal pain, a SBO, and in DKA.  Hospitalists saw patient for medical  of diabetes.  She was then taken to the OR for a laparotomy and lysis of adhesions.  Post op she did well with advancement of diet, activity, and pain control.  The hospitalists were able to improve her diabetes management and overall medical situation.  By POD#6, she was ready for discharge  Consults: Hospitalists  Significant Diagnostic Studies:   Treatments: surgery: exploratory laparotomy and lysis of adhesions  Discharge Exam: Blood pressure 118/68, pulse 69, temperature 98.5 F (36.9 C), temperature source Oral, resp. rate 16, height 5\' 2"  (1.575 m), weight 93.4 kg, SpO2 100 %. General appearance: alert, cooperative and no distress Resp: clear to auscultation bilaterally Cardio: regular rate and rhythm, S1, S2 normal, no murmur, click, rub or gallop Incision/Wound:incision healing well  Disposition: Discharge disposition: 01-Home or Self Care       Discharge Instructions    Diet - low sodium heart healthy   Complete by: As directed    Increase activity slowly   Complete by: As directed      Allergies as of 04/01/2019   No Known Allergies     Medication List    STOP  taking these medications   apixaban 5 MG Tabs tablet Commonly known as: Eliquis   glipiZIDE 5 MG tablet Commonly known as: GLUCOTROL   metFORMIN 1000 MG tablet Commonly known as: GLUCOPHAGE   predniSONE 10 MG (48) Tbpk tablet Commonly known as: STERAPRED UNI-PAK 48 TAB     TAKE these medications   diltiazem 120 MG 24 hr capsule Commonly known as: CARDIZEM CD Take 1 capsule (120 mg total) by mouth daily. What changed: additional instructions   flecainide 100 MG tablet Commonly known as: TAMBOCOR Take 1 tablet (100 mg total) by mouth 2 (two) times daily. What changed: additional instructions   Insulin Pen Needle 31G X 4 MM Misc 1 Container by Does not apply route 2 (two) times daily.   metoprolol tartrate 25 MG tablet Commonly known as: LOPRESSOR Take 1 tablet (25 mg total) by mouth 2 (two) times daily. What changed: additional instructions   NovoLIN 70/30 FlexPen (70-30) 100 UNIT/ML PEN Generic drug: Insulin Isophane & Regular Human Inject 9 Units into the skin 2 (two) times daily.   rivaroxaban 20 MG Tabs tablet Commonly known as: XARELTO Take 1 tablet (20 mg total) by mouth daily with supper.      Follow-up Information    [facility] Follow up.   Why: please schedule follow-up visit to establish primary care. Contact information: [address] [place] [address] [place] [phone]       [phone], MD. Schedule an appointment as soon as possible for a visit in 1 week(s).   Specialty: General Surgery Why:  call office on Monday to get an appointment for no earlier than 12/31 for staple removal Contact information: [place] [id number] [phone]           Signed: Coralie Keens 04/01/2019, 3:11 PM

## 2019-04-01 NOTE — Progress Notes (Signed)
Inpatient Diabetes Program Recommendations  AACE/ADA: New Consensus Statement on Inpatient Glycemic Control  Target Ranges:  Prepandial:   less than 140 mg/dL      Peak postprandial:   less than 180 mg/dL (1-2 hours)      Critically ill patients:  140 - 180 mg/dL    Results for Dawn Braun, Dawn Braun (MRN [id number]) as of 04/01/2019 11:45  Ref. Range 03/31/2019 08:00 03/31/2019 12:11 03/31/2019 16:11 03/31/2019 20:13 03/31/2019 23:38 04/01/2019 03:38 04/01/2019 07:59  Glucose-Capillary Latest Ref Range: 70 - 99 mg/dL 145 (H) 216 (H) 199 (H) 270 (H) 187 (H) 204 (H) 154 (H)    Note: Received page from Dr. Horris Latino regarding discharge plan for DM control. Patient has Medicaid listed as insurance in chart but patient states she only has [employer] Spoke with patient over the phone regarding DM control and insulin. Patient states that she will have to pay out of pocket for insulin. Discussed current insulin regimen and explained that due to cost of Lantus and Novolog (over $250 per vial and $400 for pens) she will need affordable insulin. Discussed more affordable insulin (Novolin) at [facility] which is $25 per vial or $43 per box of 5 insulin pens. Patient states that she would prefer insulin pens and she could afford $43 per box and box of insulin pens needles $9 per box of 50 insulin pen needles.  Patient states that she is having to pay out of pocket for testing strips for One Touch glucometer. Discussed more affordable glucometer and testing supplies at [facility],  Reli-on Classic glucometer for $9 and a box of 50 Reli-On test strips for $9. Discussed 70/30 insulin in detail (how to take it, when to take it).  Asked patient to check  glucose 4 times per day (before meals and at bedtime). Explained how the doctor she follows up with can use the log book to continue to make insulin adjustments if needed. Patient states that she has used a pen for Victoza injections in the past and she has given  herself some insulin injections while inpatient.   Patient has not seen PCP since 2019 and she will plan to call and see if she can get reestablished with PCP. Advised patient to use local resources such as [facility] if she is not able to get back in with PCP.  Patient verbalized understanding of information discussed and she states that she has no further questions at this time related to diabetes.   At time of discharge will need Rx for NOVOLIN 70/30 Flexpens (#[id number]) and insulin pen needles(#[id number]). Based on current insulin orders and glycemic trends, would recommend 70/30 9 units BID (will provide a total of 12.6 units for basal and 5.4 units for meal coverage per day).   Thanks, Barnie Alderman, RN, MSN, CDE Diabetes Coordinator Inpatient Diabetes Program [phone] (Team Pager from 8am to 5pm)

## 2019-04-01 NOTE — Discharge Instructions (Signed)
Information on my medicine - ELIQUIS (apixaban)  This medication education was reviewed with me or my healthcare representative as part of my discharge preparation.    Why was Eliquis prescribed for you? Eliquis was prescribed for you to reduce the risk of a blood clot forming that can cause a stroke if you have a medical condition called atrial fibrillation (a type of irregular heartbeat).  What do You need to know about Eliquis ? Take your Eliquis TWICE DAILY - one tablet in the morning and one tablet in the evening with or without food. If you have difficulty swallowing the tablet whole please discuss with your pharmacist how to take the medication safely.  Take Eliquis exactly as prescribed by your doctor and DO NOT stop taking Eliquis without talking to the doctor who prescribed the medication.  Stopping may increase your risk of developing a stroke.  Refill your prescription before you run out.  After discharge, you should have regular check-up appointments with your healthcare provider that is prescribing your Eliquis.  In the future your dose may need to be changed if your kidney function or weight changes by a significant amount or as you get older.  What do you do if you miss a dose? If you miss a dose, take it as soon as you remember on the same day and resume taking twice daily.  Do not take more than one dose of ELIQUIS at the same time to make up a missed dose.  Important Safety Information A possible side effect of Eliquis is bleeding. You should call your healthcare provider right away if you experience any of the following: ? Bleeding from an injury or your nose that does not stop. ? Unusual colored urine (red or dark brown) or unusual colored stools (red or black). ? Unusual bruising for unknown reasons. ? A serious fall or if you hit your head (even if there is no bleeding).  Some medicines may interact with Eliquis and might increase your risk of bleeding or  clotting while on Eliquis. To help avoid this, consult your healthcare provider or pharmacist prior to using any new prescription or non-prescription medications, including herbals, vitamins, non-steroidal anti-inflammatory drugs (NSAIDs) and supplements.  This website has more information on Eliquis (apixaban): http://www.eliquis.com/eliquis/home   [facility], [place] [phone]  OPEN ABDOMINAL SURGERY: POST OP INSTRUCTIONS  Always review your discharge instruction sheet given to you by the facility where your surgery was performed.  IF YOU HAVE DISABILITY OR FAMILY LEAVE FORMS, YOU MUST BRING THEM TO THE OFFICE FOR PROCESSING.  PLEASE DO NOT GIVE THEM TO YOUR DOCTOR.  1. A prescription for pain medication may be given to you upon discharge.  Take your pain medication as prescribed, if needed.  If narcotic pain medicine is not needed, then you may take acetaminophen (Tylenol) or ibuprofen (Advil) as needed. 2. Take your usually prescribed medications unless otherwise directed. 3. If you need a refill on your pain medication, please contact your pharmacy. They will contact our office to request authorization.  Prescriptions will not be filled after 5pm or on week-ends. 4. You should follow a light diet the first few days after arrival home, such as soup and crackers, pudding, etc.unless your doctor has advised otherwise. A high-fiber, low fat diet can be resumed as tolerated.   Be sure to include lots of fluids daily. Most patients will experience some swelling and bruising on the chest and neck area.  Ice  packs will help.  Swelling and bruising can take several days to resolve 5. Most patients will experience some swelling and bruising in the area of the incision. Ice pack will help. Swelling and bruising can take several days to resolve..  6. It is common to experience some constipation if taking pain medication after surgery.  Increasing fluid intake and taking a stool  softener will usually help or prevent this problem from occurring.  A mild laxative (Milk of Magnesia or Miralax) should be taken according to package directions if there are no bowel movements after 48 hours. 7.  You may have steri-strips (small skin tapes) in place directly over the incision.  These strips should be left on the skin for 7-10 days.  If your surgeon used skin glue on the incision, you may shower in 24 hours.  The glue will flake off over the next 2-3 weeks.  Any sutures or staples will be removed at the office during your follow-up visit. You may find that a light gauze bandage over your incision may keep your staples from being rubbed or pulled. You may shower and replace the bandage daily. 8. ACTIVITIES:  You may resume regular (light) daily activities beginning the next day--such as daily self-care, walking, climbing stairs--gradually increasing activities as tolerated.  You may have sexual intercourse when it is comfortable.  Refrain from any heavy lifting or straining until approved by your doctor. a. You may drive when you no longer are taking prescription pain medication, you can comfortably wear a seatbelt, and you can safely maneuver your car and apply brakes b. Return to Work: ___________________________________ 9. You should see your doctor in the office for a follow-up appointment approximately two weeks after your surgery.  Make sure that you call for this appointment within a day or two after you arrive home to insure a convenient appointment time. OTHER INSTRUCTIONS:  _____________________________________________________________ _____________________________________________________________  WHEN TO CALL YOUR DOCTOR: 1. Fever over 101.0 2. Inability to urinate 3. Nausea and/or vomiting 4. Extreme swelling or bruising 5. Continued bleeding from incision. 6. Increased pain, redness, or drainage from the incision. 7. Difficulty swallowing or breathing 8. Muscle cramping  or spasms. 9. Numbness or tingling in hands or feet or around lips.  The clinic staff is available to answer your questions during regular business hours.  Please don't hesitate to call and ask to speak to one of the nurses if you have concerns.  For further questions, please visit [email or web address]

## 2019-04-01 NOTE — TOC Progression Note (Signed)
Transition of Care [facility]) - Progression Note    Patient Details  Name: Dawn Braun MRN: [id number] Date of Birth: 18-Sep-1965  Transition of Care [facility]) CM/SW Contact  Joaquin Courts, RN Phone Number: 04/01/2019, 11:59 AM  Clinical Narrative:    CM spoke with patient at bedside.  Patient reports she believes she has medicaid family planning but is uncertain. Reports her husband had medicaid family planning after being laid off and she feels she got the same after she lost her job.  CM reviewed chart and noted Medicaid [place] access listed. Patient reports she has her card at home and can look and call to confirm which plan she has.  CM discussed with the patient the possibility of establishing primary care with [facility], where patient can apply for financial assistance if needed  To cover the cost of her medications and MD visits. Patient reports she has spoke with diabetes coordinator regarding relion 70/30 insulin available from [employer]. Patient feels she can afford this today along with the meter and supplies she will need. [facility] information placed on AVS.    Expected Discharge Plan: Home/Self Care Barriers to Discharge: No Barriers Identified  Expected Discharge Plan and Services Expected Discharge Plan: Home/Self Care   Discharge Planning Services: CM Consult   Living arrangements for the past 2 months: Single Family Home                                       Social Determinants of Health (SDOH) Interventions    Readmission Risk Interventions No flowsheet data found.

## 2019-04-01 NOTE — Progress Notes (Signed)
6 Days Post-Op   Subjective/Chief Complaint: Tolerating po and having BM's Pain controlled She has noticed a sore area on her left neck that she reports was not present on admission   Objective: Vital signs in last 24 hours: Temp:  [98.1 F (36.7 C)-98.7 F (37.1 C)] 98.6 F (37 C) (12/26 0546) Pulse Rate:  [66-71] 67 (12/26 0546) Resp:  [14-16] 16 (12/26 0546) BP: (116-136)/(61-80) 116/65 (12/26 0546) SpO2:  [99 %-100 %] 100 % (12/26 0546) Last BM Date: 03/31/19  Intake/Output from previous day: 12/25 0701 - 12/26 0700 In: 60 [P.O.:60] Out: 0  Intake/Output this shift: No intake/output data recorded.  Exam: Awake and alert Abdomen soft, minimally tender Abrasion to left neck without sign of infection  Lab Results:  Recent Labs    03/31/19 0451 04/01/19 0449  WBC 13.4* 9.9  HGB 9.4* 11.1*  HCT 30.0* 35.2*  PLT 257 222   BMET Recent Labs    03/31/19 0451  NA 140  K 4.4  CL 114*  CO2 18*  GLUCOSE 195*  BUN 25*  CREATININE 0.96  CALCIUM 7.9*   PT/INR No results for input(s): LABPROT, INR in the last 72 hours. ABG No results for input(s): PHART, HCO3 in the last 72 hours.  Invalid input(s): PCO2, PO2  Studies/Results: No results found.  Anti-infectives: Anti-infectives (From admission, onward)   Start     Dose/Rate Route Frequency Ordered Stop   03/26/19 1300  ertapenem (INVANZ) 1,000 mg in sodium chloride 0.9 % 100 mL IVPB     1 g 200 mL/hr over 30 Minutes Intravenous On call to O.R. 03/26/19 1250 03/26/19 1351      Assessment/Plan: s/p Procedure(s): EXPLORATORY LAPAROTOMY (N/A) LYSIS OF ADHESION (N/A)  She is ready for discharge from a surgical standpoint. Her issue is her inability to afford her diabetic meds.  She will need to see case management to see what aid is available before she can go  I am uncertain of the cause of the abrasion to her left neck which is superficial.  No treatment currently needed  LOS: 7 days    Coralie Keens 04/01/2019

## 2019-04-03 ENCOUNTER — Telehealth: Payer: Self-pay

## 2019-04-03 NOTE — Telephone Encounter (Signed)
Message received from Nancy Marus, RN CM requesting a hospital follow up appointment for the patient at [facility].   Call placed to the patient and scheduled an appt for 04/27/2019 @ 0930. She has an appt with her surgeon for staple removal on 04/06/2019.   She was concerned about what kind of medicaid she has. This CM explained that there information available indicates that she has full medicaid.  She then said that she noticed in her MyChart that she has a diagnosis of CKD-stage 3.  She understood what that is and was concerned that this had never been mentioned to her in the past. She said that she could see notes about CKD in 2018. She also noted that her mother and brother are on dialysis.  She said that she had been to a nephrologist in the past but was not aware of a problem.   She will ask her surgeon about this issue. Informed her that Dr Chapman Fitch will be notified of her question. Reminded her to take her medications including insulin as ordered and to keep a log of her blood sugars. She confirmed that she has a working glucometer. She said her last A1c was " about 14."  Explained to her that Dr Chapman Fitch would also discuss her concerns and would make the appropriate referrals to specialists as needed. Also explained to her that she can call the clinic at any time to check availability for an appointment sooner than 04/27/2019.

## 2019-04-27 ENCOUNTER — Ambulatory Visit: Payer: Medicaid Other | Attending: Family Medicine | Admitting: Family Medicine

## 2019-04-27 ENCOUNTER — Encounter: Payer: Self-pay | Admitting: Family Medicine

## 2019-04-27 ENCOUNTER — Other Ambulatory Visit: Payer: Self-pay

## 2019-04-27 VITALS — BP 139/84 | HR 87 | Temp 98.7°F | Resp 16 | Ht 64.17 in | Wt 201.0 lb

## 2019-04-27 DIAGNOSIS — IMO0002 Reserved for concepts with insufficient information to code with codable children: Secondary | ICD-10-CM

## 2019-04-27 DIAGNOSIS — E118 Type 2 diabetes mellitus with unspecified complications: Secondary | ICD-10-CM

## 2019-04-27 DIAGNOSIS — D649 Anemia, unspecified: Secondary | ICD-10-CM

## 2019-04-27 DIAGNOSIS — I480 Paroxysmal atrial fibrillation: Secondary | ICD-10-CM

## 2019-04-27 DIAGNOSIS — E1165 Type 2 diabetes mellitus with hyperglycemia: Secondary | ICD-10-CM | POA: Diagnosis not present

## 2019-04-27 DIAGNOSIS — Z7901 Long term (current) use of anticoagulants: Secondary | ICD-10-CM

## 2019-04-27 DIAGNOSIS — Z79899 Other long term (current) drug therapy: Secondary | ICD-10-CM

## 2019-04-27 LAB — POCT URINALYSIS DIP (CLINITEK)
Bilirubin, UA: NEGATIVE
Glucose, UA: NEGATIVE mg/dL
Ketones, POC UA: NEGATIVE mg/dL
Leukocytes, UA: NEGATIVE
Nitrite, UA: NEGATIVE
POC PROTEIN,UA: >=300 — AB
Spec Grav, UA: >=1.03 — AB
Urobilinogen, UA: 0.2 U/dL
pH, UA: 5.5

## 2019-04-27 LAB — GLUCOSE, POCT (MANUAL RESULT ENTRY): POC Glucose: 191 mg/dL — AB (ref 70–99)

## 2019-04-27 MED ORDER — RIVAROXABAN 20 MG PO TABS: 20.0000 mg | ORAL_TABLET | Freq: Every day | ORAL | 5 refills | Status: DC

## 2019-04-27 MED ORDER — DILTIAZEM HCL ER COATED BEADS 120 MG PO CP24: 120.0000 mg | ORAL_CAPSULE | Freq: Every day | ORAL | 5 refills | Status: DC

## 2019-04-27 MED ORDER — METFORMIN HCL 500 MG PO TABS: ORAL_TABLET | ORAL | 3 refills | Status: DC

## 2019-04-27 NOTE — Patient Instructions (Addendum)
Schedule follow-up with Dr. Magnus Ivan in follow-up of your recent surgery. Please bring your glucometer and blood sugar diary when you meet with the clinical pharmacist. You will be contacted about diabetic education/nutrition- call office if you have not heard anything about your appointment in the next 2 weeks   Diabetes Basics  Diabetes (diabetes mellitus) is a long-term (chronic) disease. It occurs when the body does not properly use sugar (glucose) that is released from food after you eat. Diabetes may be caused by one or both of these problems:  Your pancreas does not make enough of a hormone called insulin.  Your body does not react in a normal way to insulin that it makes. Insulin lets sugars (glucose) go into cells in your body. This gives you energy. If you have diabetes, sugars cannot get into cells. This causes high blood sugar (hyperglycemia). Follow these instructions at home: How is diabetes treated? You may need to take insulin or other diabetes medicines daily to keep your blood sugar in balance. Take your diabetes medicines every day as told by your doctor. List your diabetes medicines here: Diabetes medicines  Name of medicine: ______________________________ ? Amount (dose): _______________ Time (a.m./p.m.): _______________ Notes: ___________________________________  Name of medicine: ______________________________ ? Amount (dose): _______________ Time (a.m./p.m.): _______________ Notes: ___________________________________  Name of medicine: ______________________________ ? Amount (dose): _______________ Time (a.m./p.m.): _______________ Notes: ___________________________________ If you use insulin, you will learn how to give yourself insulin by injection. You may need to adjust the amount based on the food that you eat. List the types of insulin you use here: Insulin  Insulin type: ______________________________ ? Amount (dose): _______________ Time (a.m./p.m.):  _______________ Notes: ___________________________________  Insulin type: ______________________________ ? Amount (dose): _______________ Time (a.m./p.m.): _______________ Notes: ___________________________________  Insulin type: ______________________________ ? Amount (dose): _______________ Time (a.m./p.m.): _______________ Notes: ___________________________________  Insulin type: ______________________________ ? Amount (dose): _______________ Time (a.m./p.m.): _______________ Notes: ___________________________________  Insulin type: ______________________________ ? Amount (dose): _______________ Time (a.m./p.m.): _______________ Notes: ___________________________________ How do I manage my blood sugar?  Check your blood sugar levels using a blood glucose monitor as directed by your doctor. Your doctor will set treatment goals for you. Generally, you should have these blood sugar levels:  Before meals (preprandial): 80-130 mg/dL (8.5-4.6 mmol/L).  After meals (postprandial): below 180 mg/dL (10 mmol/L).  A1c level: less than 7%. Write down the times that you will check your blood sugar levels: Blood sugar checks  Time: _______________ Notes: ___________________________________  Time: _______________ Notes: ___________________________________  Time: _______________ Notes: ___________________________________  Time: _______________ Notes: ___________________________________  Time: _______________ Notes: ___________________________________  Time: _______________ Notes: ___________________________________  What do I need to know about low blood sugar? Low blood sugar is called hypoglycemia. This is when blood sugar is at or below 70 mg/dL (3.9 mmol/L). Symptoms may include:  Feeling: ? Hungry. ? Worried or nervous (anxious). ? Sweaty and clammy. ? Confused. ? Dizzy. ? Sleepy. ? Sick to your stomach (nauseous).  Having: ? A fast heartbeat. ? A headache. ? A change in  your vision. ? Tingling or no feeling (numbness) around the mouth, lips, or tongue. ? Jerky movements that you cannot control (seizure).  Having trouble with: ? Moving (coordination). ? Sleeping. ? Passing out (fainting). ? Getting upset easily (irritability). Treating low blood sugar To treat low blood sugar, eat or drink something sugary right away. If you can think clearly and swallow safely, follow the 15:15 rule:  Take 15 grams of a fast-acting carb (carbohydrate). Talk with your doctor about how  much you should take.  Some fast-acting carbs are: ? Sugar tablets (glucose pills). Take 3-4 glucose pills. ? 6-8 pieces of hard candy. ? 4-6 oz (120-150 mL) of fruit juice. ? 4-6 oz (120-150 mL) of regular (not diet) soda. ? 1 Tbsp (15 mL) honey or sugar.  Check your blood sugar 15 minutes after you take the carb.  If your blood sugar is still at or below 70 mg/dL (3.9 mmol/L), take 15 grams of a carb again.  If your blood sugar does not go above 70 mg/dL (3.9 mmol/L) after 3 tries, get help right away.  After your blood sugar goes back to normal, eat a meal or a snack within 1 hour. Treating very low blood sugar If your blood sugar is at or below 54 mg/dL (3 mmol/L), you have very low blood sugar (severe hypoglycemia). This is an emergency. Do not wait to see if the symptoms will go away. Get medical help right away. Call your local emergency services (911 in the [place] Do not drive yourself to the hospital. Questions to ask your health care provider  Do I need to meet with a diabetes educator?  What equipment will I need to care for myself at home?  What diabetes medicines do I need? When should I take them?  How often do I need to check my blood sugar?  What number can I call if I have questions?  When is my next doctor's visit?  Where can I find a support group for people with diabetes? Where to find more information  American Diabetes Association:  www.diabetes.org  American Association of Diabetes Educators: www.diabeteseducator.org/patient-resources Contact a doctor if:  Your blood sugar is at or above 240 mg/dL (13.3 mmol/L) for 2 days in a row.  You have been sick or have had a fever for 2 days or more, and you are not getting better.  You have any of these problems for more than 6 hours: ? You cannot eat or drink. ? You feel sick to your stomach (nauseous). ? You throw up (vomit). ? You have watery poop (diarrhea). Get help right away if:  Your blood sugar is lower than 54 mg/dL (3 mmol/L).  You get confused.  You have trouble: ? Thinking clearly. ? Breathing. Summary  Diabetes (diabetes mellitus) is a long-term (chronic) disease. It occurs when the body does not properly use sugar (glucose) that is released from food after digestion.  Take insulin and diabetes medicines as told.  Check your blood sugar every day, as often as told.  Keep all follow-up visits as told by your doctor. This is important. This information is not intended to replace advice given to you by your health care provider. Make sure you discuss any questions you have with your health care provider. Document Revised: 12/14/2018 Document Reviewed: 06/25/2017 Elsevier Patient Education  [place].

## 2019-04-27 NOTE — Progress Notes (Signed)
Here for a Hospital F/u  CHG fasting 191

## 2019-04-27 NOTE — Progress Notes (Signed)
Subjective:  Patient ID: Dawn Braun, female    DOB: [birth year]  Age: 54 y.o. MRN: [id number]  CC: Hospitalization Follow-up   HPI Dawn Braun, 54 year old female, who presents to establish care status post recent hospital discharge from 03/25/2019 through 04/01/2019 after presenting to the emergency department with abdominal pain, nausea and vomiting.  Per H&P, her CT of the abdomen showed dilated loops of small bowel without obvious transition point consistent with partial obstruction. Per discharge summary she required surgical intervention and patient had closed-loop small bowel obstruction for which exploratory laparotomy was done with lysis of adhesions on 03/26/2019 .  Her active problems per discharge summary include uncontrolled type 2 diabetes with complications with DKA upon admission, paroxysmal atrial fibrillation, asthma, acute kidney injury, acute renal failure superimposed on stage III chronic kidney disease, chronic kidney disease stage III with GFR of 30-59 ml/min, obesity and chronic anticoagulation.  She had discontinuation of Eliquis 5 mg, glipizide 5 mg, Metformin 1000 mg and prednisone Sterapred pack.  She was started on NovoLog 70/30 9 units twice daily for hypertension, Xarelto 20 mg daily for anticoagulation due to paroxysmal atrial fibrillation along with diltiazem metoprolol and flecainide for paroxysmal atrial fibrillation.  She had mild anemia at time of hospital discharge with hemoglobin of 11.1 on 04/01/2019.  BMP on 03/31/2019 with glucose of 195, creatinine normal at 0.96 with normal EGFR of greater than 60.  Hemoglobin A1c of 13.6 on 03/26/2019.        At today's visit, she reports that overall she feels well with the exception of some mild left upper abdominal discomfort which is about a 2-3 on a 0-to-10 scale with 10 being the worst imaginable pain.  She denies any fever or chills.  No nausea/vomiting/diarrhea or constipation.  No blood in the stool or  black stools.  She does have frequent urination.  Home blood sugars remain elevated but are not checked daily.  Blood sugars are generally in the 180s to 200s fasting.  She has started taking medications as per hospital discharge for treatment of diabetes, hypertension, and atrial fibrillation.  She denies any issues with unusual bruising or bleeding.  She has recently started having some mild, generalized headaches when she gets up at night due to the urge to urinate.   Past Medical History:  Diagnosis Date  . Asthma   . Complication of anesthesia    "takes me awhile to wakeup" (05/17/2017)  . Hypertension   . Intractable nausea and vomiting   . Paroxysmal atrial fibrillation ([facility])   . Type II diabetes mellitus ([place])     Past Surgical History:  Procedure Laterality Date  . CESAREAN SECTION  2003  . CHOLECYSTECTOMY N/A 12/23/2016   Procedure: LAPAROSCOPIC CHOLECYSTECTOMY;  Surgeon: Kieth Brightly Arta Bruce, MD;  Location: [facility];  Service: General;  Laterality: N/A;  . EYE SURGERY Right ~ 2016   "bleeding issues; lasered; cauterized leaks"  . KNEE ARTHROSCOPY Right X 2  . LAPAROTOMY N/A 03/26/2019   Procedure: EXPLORATORY LAPAROTOMY;  Surgeon: Armandina Gemma, MD;  Location: [facility];  Service: General;  Laterality: N/A;  . LYSIS OF ADHESION N/A 03/26/2019   Procedure: LYSIS OF ADHESION;  Surgeon: Armandina Gemma, MD;  Location: [facility];  Service: General;  Laterality: N/A;  . SPLIT NIGHT STUDY  08/06/2015  . TUBAL LIGATION  2003    Family History  Problem Relation Age of Onset  . Epilepsy Father   . Diabetes Mother   . Kidney disease Mother   .  Hypertension Mother   . Kidney disease Brother   . Diabetic kidney disease Brother   . Diabetes Brother     Social History   Tobacco Use  . Smoking status: Never Smoker  . Smokeless tobacco: Never Used  Substance Use Topics  . Alcohol use: Yes    Comment: 05/17/2017 "glass of wine q other weekend"    ROS Review of Systems  Constitutional:  Positive for fatigue (mild). Negative for chills and fever.  HENT: Negative for nosebleeds, sore throat and trouble swallowing.   Eyes: Negative for photophobia and visual disturbance.       Wears glasses  Respiratory: Negative for cough and shortness of breath.   Cardiovascular: Negative for chest pain, palpitations and leg swelling.  Gastrointestinal: Negative for abdominal pain, blood in stool, constipation, diarrhea, nausea and vomiting.  Endocrine: Positive for polyuria. Negative for polydipsia and polyphagia.  Genitourinary: Positive for frequency. Negative for dysuria.  Musculoskeletal: Negative for arthralgias and back pain.  Neurological: Positive for headaches (at night when she awakes to go to the restroom). Negative for dizziness.  Hematological: Negative for adenopathy. Does not bruise/bleed easily.  Psychiatric/Behavioral: Negative for sleep disturbance. The patient is not nervous/anxious.     Objective:   Today's Vitals: BP (!) 141/80 (BP Location: Left Arm)   Pulse 87   Temp 98.7 F (37.1 C) (Oral)   Resp 16   Ht 5' 4.17" (1.63 m)   Wt 201 lb (91.2 kg)   SpO2 99%   BMI 34.32 kg/m    Physical Exam Constitutional:      General: She is not in acute distress.    Appearance: Normal appearance.     Comments: Overweight for height  Neck:     Vascular: No carotid bruit.  Cardiovascular:     Rate and Rhythm: Normal rate and regular rhythm.     Pulses:          Dorsalis pedis pulses are 1+ on the right side and 1+ on the left side.       Posterior tibial pulses are 1+ on the right side and 1+ on the left side.  Pulmonary:     Effort: Pulmonary effort is normal.     Breath sounds: Normal breath sounds.  Abdominal:     General: Bowel sounds are normal.     Palpations: Abdomen is soft.     Tenderness: There is abdominal tenderness (area of mild tenderness to palp in left upper quadrant). There is no right CVA tenderness, left CVA tenderness, guarding or rebound.    Musculoskeletal:        General: No tenderness, deformity or signs of injury.     Cervical back: Normal range of motion and neck supple. No tenderness.     Right lower leg: No edema.     Left lower leg: No edema.     Right foot: Normal range of motion. No deformity, bunion, Charcot foot, foot drop or prominent metatarsal heads.     Left foot: Normal range of motion. No deformity, bunion, Charcot foot, foot drop or prominent metatarsal heads.  Feet:     Right foot:     Protective Sensation: 10 sites tested. 9 sites sensed.     Skin integrity: Callus (mild right heel) present. No ulcer, blister, skin breakdown, erythema, warmth, dry skin or fissure.     Toenail Condition: Right toenails are normal.     Left foot:     Protective Sensation: 10 sites tested. 10 sites  sensed.     Skin integrity: No ulcer, blister, skin breakdown, erythema, warmth, callus, dry skin or fissure.     Toenail Condition: Left toenails are normal.     Comments: Absent monofilament on right heel and mild callus/thickened skin in this area Lymphadenopathy:     Cervical: No cervical adenopathy.  Skin:    General: Skin is warm and dry.  Neurological:     General: No focal deficit present.     Mental Status: She is alert and oriented to person, place, and time.     Cranial Nerves: No cranial nerve deficit.  Psychiatric:        Mood and Affect: Mood normal.        Behavior: Behavior normal.     Assessment & Plan:  1. Diabetes mellitus type 2, uncontrolled, with complications ([place]) Discussed the importance of compliance with medications for treatment of diabetes as well as daily monitoring of blood sugars to help improve control of diabetes.  Fasting blood sugar of 191 at today's visit.  Referral made to nutrition and diabetes support services.  Referral also placed for patient to follow-up with the clinical pharmacist regarding the control of her diabetes.  Refills provided of 70/30 insulin and Metformin.  Low  carbohydrate diet and low impact exercise encouraged.  Urinalysis and follow-up of patient's urinary frequency which may be related to her uncontrolled diabetes but also be related to urinary tract infection. - Glucose (CBG) - Comprehensive metabolic panel -POCT UA  2. Paroxysmal atrial fibrillation [facility]) Continue cardiology follow-up.  Continue medication for rate control.  Continue use of blood thinning medication to help reduce the risk of embolic stroke associated with atrial fibrillation. - Comprehensive metabolic panel - CBC  3. Normocytic anemia; 4.  Long-term use of anticoagulant CBC in follow-up of anemia and patient with use of blood thinning medication due to atrial fibrillation. - CBC  5. Long-term use of high-risk medication Comprehensive metabolic panel in follow-up of long-term use of high-risk medications including medications for the treatment of hypertension, diabetes and atrial fibrillation. - Comprehensive metabolic panel  6.  Abnormal urinalysis Patient with abnormal findings on urinalysis and urine will be sent for culture.  Patient will be notified of further treatment is needed based on the culture results. -Urine culture  Outpatient Encounter Medications as of 04/27/2019  Medication Sig  . diltiazem (CARDIZEM CD) 120 MG 24 hr capsule Take 1 capsule (120 mg total) by mouth daily.  . flecainide (TAMBOCOR) 100 MG tablet Take 1 tablet (100 mg total) by mouth 2 (two) times daily.  . Insulin Isophane & Regular Human (NOVOLIN 70/30 FLEXPEN) (70-30) 100 UNIT/ML PEN Inject 9 Units into the skin 2 (two) times daily.  . Insulin Pen Needle 31G X 4 MM MISC 1 Container by Does not apply route 2 (two) times daily.  . metoprolol tartrate (LOPRESSOR) 25 MG tablet Take 1 tablet (25 mg total) by mouth 2 (two) times daily.  . rivaroxaban (XARELTO) 20 MG TABS tablet Take 1 tablet (20 mg total) by mouth daily with supper.   No facility-administered encounter medications on file as of  04/27/2019.    An After Visit Summary was printed and given to the patient.   Follow-up: Return in about 3 months (around 07/26/2019) for DM/chronic issues; 4 weeks with Luke/CPP.   Antony Blackbird MD

## 2019-04-28 LAB — LIPID PANEL
Chol/HDL Ratio: 3.5 ratio (ref 0.0–4.4)
Cholesterol, Total: 183 mg/dL (ref 100–199)
HDL: 52 mg/dL
LDL Chol Calc (NIH): 117 mg/dL — ABNORMAL HIGH (ref 0–99)
Triglycerides: 77 mg/dL (ref 0–149)
VLDL Cholesterol Cal: 14 mg/dL (ref 5–40)

## 2019-04-28 LAB — COMPREHENSIVE METABOLIC PANEL WITH GFR
ALT: 10 IU/L (ref 0–32)
AST: 15 IU/L (ref 0–40)
Albumin/Globulin Ratio: 1.3 (ref 1.2–2.2)
Albumin: 3.5 g/dL — ABNORMAL LOW (ref 3.8–4.9)
Alkaline Phosphatase: 108 IU/L (ref 39–117)
BUN/Creatinine Ratio: 20 (ref 9–23)
BUN: 22 mg/dL (ref 6–24)
Bilirubin Total: <0.2 mg/dL (ref 0.0–1.2)
CO2: 21 mmol/L (ref 20–29)
Calcium: 10 mg/dL (ref 8.7–10.2)
Chloride: 107 mmol/L — ABNORMAL HIGH (ref 96–106)
Creatinine, Ser: 1.09 mg/dL — ABNORMAL HIGH (ref 0.57–1.00)
GFR calc Af Amer: 67 mL/min/1.73
GFR calc non Af Amer: 58 mL/min/1.73 — ABNORMAL LOW
Globulin, Total: 2.8 g/dL (ref 1.5–4.5)
Glucose: 153 mg/dL — ABNORMAL HIGH (ref 65–99)
Potassium: 4.7 mmol/L (ref 3.5–5.2)
Sodium: 143 mmol/L (ref 134–144)
Total Protein: 6.3 g/dL (ref 6.0–8.5)

## 2019-04-28 LAB — CBC
Hematocrit: 33.3 % — ABNORMAL LOW (ref 34.0–46.6)
Hemoglobin: 10.8 g/dL — ABNORMAL LOW (ref 11.1–15.9)
MCH: 27.7 pg (ref 26.6–33.0)
MCHC: 32.4 g/dL (ref 31.5–35.7)
MCV: 85 fL (ref 79–97)
Platelets: 361 x10E3/uL (ref 150–450)
RBC: 3.9 x10E6/uL (ref 3.77–5.28)
RDW: 13 % (ref 11.7–15.4)
WBC: 8 x10E3/uL (ref 3.4–10.8)

## 2019-04-28 LAB — MICROALBUMIN / CREATININE URINE RATIO
Creatinine, Urine: 256.9 mg/dL
Microalb/Creat Ratio: 1422 mg/g{creat} — ABNORMAL HIGH (ref 0–29)
Microalbumin, Urine: 3652.7 ug/mL

## 2019-04-29 LAB — URINE CULTURE

## 2019-05-01 ENCOUNTER — Other Ambulatory Visit: Payer: Self-pay

## 2019-05-01 ENCOUNTER — Ambulatory Visit: Payer: Medicaid Other | Admitting: Cardiology

## 2019-05-01 ENCOUNTER — Encounter: Payer: Self-pay | Admitting: Cardiology

## 2019-05-01 VITALS — BP 148/91 | HR 67 | Temp 97.7°F | Ht 62.0 in | Wt 205.0 lb

## 2019-05-01 DIAGNOSIS — N179 Acute kidney failure, unspecified: Secondary | ICD-10-CM

## 2019-05-01 DIAGNOSIS — Z7189 Other specified counseling: Secondary | ICD-10-CM

## 2019-05-01 DIAGNOSIS — E1165 Type 2 diabetes mellitus with hyperglycemia: Secondary | ICD-10-CM

## 2019-05-01 DIAGNOSIS — I129 Hypertensive chronic kidney disease with stage 1 through stage 4 chronic kidney disease, or unspecified chronic kidney disease: Secondary | ICD-10-CM | POA: Diagnosis not present

## 2019-05-01 DIAGNOSIS — I480 Paroxysmal atrial fibrillation: Secondary | ICD-10-CM

## 2019-05-01 DIAGNOSIS — Z7182 Exercise counseling: Secondary | ICD-10-CM

## 2019-05-01 DIAGNOSIS — I10 Essential (primary) hypertension: Secondary | ICD-10-CM

## 2019-05-01 DIAGNOSIS — N183 Chronic kidney disease, stage 3 unspecified: Secondary | ICD-10-CM

## 2019-05-01 DIAGNOSIS — E7800 Pure hypercholesterolemia, unspecified: Secondary | ICD-10-CM

## 2019-05-01 DIAGNOSIS — Z7901 Long term (current) use of anticoagulants: Secondary | ICD-10-CM

## 2019-05-01 DIAGNOSIS — Z713 Dietary counseling and surveillance: Secondary | ICD-10-CM

## 2019-05-01 DIAGNOSIS — Z79899 Other long term (current) drug therapy: Secondary | ICD-10-CM

## 2019-05-01 DIAGNOSIS — E1122 Type 2 diabetes mellitus with diabetic chronic kidney disease: Secondary | ICD-10-CM | POA: Diagnosis not present

## 2019-05-01 DIAGNOSIS — Z6837 Body mass index (BMI) 37.0-37.9, adult: Secondary | ICD-10-CM

## 2019-05-01 MED ORDER — ATORVASTATIN CALCIUM 20 MG PO TABS: 20.0000 mg | ORAL_TABLET | Freq: Every day | ORAL | 3 refills | Status: DC

## 2019-05-01 NOTE — Progress Notes (Signed)
Cardiology Office Note:    Date:  05/01/2019   ID:  [name], DOB 1965/05/14, MRN [id number]  PCP:  Cain Saupe, MD  Cardiologist:  Jodelle Red, MD  Referring MD: Cain Saupe, MD   CC: new patient evaluation for paroxysmal atrial fibrillation  History of Present Illness:    MILTA CROSON is a 54 y.o. female with a hx of recent admission for SBO s/p surgery, uncontrolled type 2 diabetes (A1c 13.6) with recent DKA, paroxysmal atrial fibrillation followed by afib clinic, asthma, stage 3 CKD with recent AKI, obesity who is seen as a new consult at the request of Fulp, Cammie, MD for the evaluation and management of paroxysmal atrial fibrillation.  Recent hospitalization and note from Dr. Jillyn Hidden (04/27/19) reviewed. She was last seen by Rudi Coco in the afib clinic 07/27/2017 for post ER follow up. At that time she was on flecainide, metoprolol, diltiazem and apixaban.  Today: She reports that the indication for the visit is that she has never been seen by a cardiologist, only afib clinic.  She is very symptomatic when she has afib. Feels like it is "revving up like a car." Last time she felt it was the first week of December. Pulse was running 120 bpm. Took her "emergency pill" which she reports is short acting diltiazem (not on her medication list).   We discussed her other CV risk factors at length today. She has hyperlipidemia but is not currently on statin. Discussed cholesterol pathology and how it affects MI/CVA, how statins work, recommendations for prevention. See below.  We also reviewed blood pressure, goals, management. She is not routinely checking BP at home. Instructed on how to take a good BP measurement and log at home.  Discussed prevention, lifestyle recommendations as well.  Denies chest pain, shortness of breath at rest or with normal exertion. No PND, orthopnea, LE edema or unexpected weight gain. No syncope.  ROS otherwise positive for  chronic hearing loss, intermittent mild night sweats, chronic itching/burning skin.  Past Medical History:  Diagnosis Date  . Asthma   . Complication of anesthesia    "takes me awhile to wakeup" (05/17/2017)  . Hypertension   . Intractable nausea and vomiting   . Paroxysmal atrial fibrillation (HCC)   . Type II diabetes mellitus (HCC)     Past Surgical History:  Procedure Laterality Date  . CESAREAN SECTION  2003  . CHOLECYSTECTOMY N/A 12/23/2016   Procedure: LAPAROSCOPIC CHOLECYSTECTOMY;  Surgeon: Sheliah Hatch De Blanch, MD;  Location: [facility];  Service: General;  Laterality: N/A;  . EYE SURGERY Right ~ 2016   "bleeding issues; lasered; cauterized leaks"  . KNEE ARTHROSCOPY Right X 2  . LAPAROTOMY N/A 03/26/2019   Procedure: EXPLORATORY LAPAROTOMY;  Surgeon: Darnell Level, MD;  Location: [facility];  Service: General;  Laterality: N/A;  . LYSIS OF ADHESION N/A 03/26/2019   Procedure: LYSIS OF ADHESION;  Surgeon: Darnell Level, MD;  Location: [facility];  Service: General;  Laterality: N/A;  . SPLIT NIGHT STUDY  08/06/2015  . TUBAL LIGATION  2003    Current Medications: Current Outpatient Medications on File Prior to Visit  Medication Sig  . diltiazem (CARDIZEM CD) 120 MG 24 hr capsule Take 1 capsule (120 mg total) by mouth daily.  . flecainide (TAMBOCOR) 100 MG tablet Take 1 tablet (100 mg total) by mouth 2 (two) times daily.  . Insulin Isophane & Regular Human (NOVOLIN 70/30 FLEXPEN) (70-30) 100 UNIT/ML PEN Inject 9 Units into the skin 2 (  two) times daily.  . Insulin Pen Needle 31G X 4 MM MISC 1 Container by Does not apply route 2 (two) times daily.  . metFORMIN (GLUCOPHAGE) 500 MG tablet Take once per day after evening meal (Patient taking differently: 1,000 mg. Take one tablet 2 times per day)  . metoprolol tartrate (LOPRESSOR) 25 MG tablet Take 1 tablet (25 mg total) by mouth 2 (two) times daily.  . rivaroxaban (XARELTO) 20 MG TABS tablet Take 1 tablet (20 mg total) by mouth daily with  supper.   No current facility-administered medications on file prior to visit.     Allergies:   Patient has no known allergies.   Social History   Tobacco Use  . Smoking status: Never Smoker  . Smokeless tobacco: Never Used  Substance Use Topics  . Alcohol use: Not Currently    Comment: 05/17/2017 "glass of wine q other weekend"  . Drug use: No    Family History: family history includes Diabetes in her brother and mother; Diabetic kidney disease in her brother; Epilepsy in her father; Hypertension in her mother; Kidney disease in her brother and mother.  ROS:   Please see the history of present illness.  Additional pertinent ROS: Constitutional: Negative for chills, fever, unintentional weight loss. Endorses intermittent mild night sweats HENT: Negative for ear pain, positive for chronic hearing loss.   Eyes: Negative for loss of vision and eye pain.  Respiratory: Negative for cough, sputum, wheezing.   Cardiovascular: See HPI. Gastrointestinal: Negative for abdominal pain, melena, and hematochezia.  Genitourinary: Negative for dysuria and hematuria.  Musculoskeletal: Negative for falls and myalgias.  Skin: Positive for chronic intermittent itching/burning skin  Neurological: Negative for focal weakness, focal sensory changes and loss of consciousness.  Endo/Heme/Allergies: Does not bruise/bleed easily.     EKGs/Labs/Other Studies Reviewed:    The following studies were reviewed today: Echo 12/2017, ETT 01/31/16, echo 07/2015  EKG:  EKG is personally reviewed.  The ekg ordered today demonstrates NSR, PRWP  Recent Labs: 04/27/2019: ALT 10; BUN 22; Creatinine, Ser 1.09; Hemoglobin 10.8; Platelets 361; Potassium 4.7; Sodium 143  Recent Lipid Panel    Component Value Date/Time   CHOL 183 04/27/2019 1039   TRIG 77 04/27/2019 1039   HDL 52 04/27/2019 1039   CHOLHDL 3.5 04/27/2019 1039   LDLCALC 117 (H) 04/27/2019 1039    Physical Exam:    VS:  BP (!) 148/91   Pulse 67    Temp 97.7 F (36.5 C)   Ht 5\' 2"  (1.575 m)   Wt 205 lb (93 kg)   SpO2 100%   BMI 37.49 kg/m     Wt Readings from Last 3 Encounters:  04/27/19 201 lb (91.2 kg)  03/30/19 205 lb 14.6 oz (93.4 kg)  09/18/18 207 lb (93.9 kg)    GEN: Well nourished, well developed in no acute distress HEENT: Normal, moist mucous membranes NECK: No JVD CARDIAC: regular rhythm, normal S1 and S2, no rubs or gallops. No murmurs. VASCULAR: Radial and DP pulses 2+ bilaterally. No carotid bruits RESPIRATORY:  Clear to auscultation without rales, wheezing or rhonchi  ABDOMEN: Soft, non-tender, non-distended MUSCULOSKELETAL:  Ambulates independently SKIN: Warm and dry, no edema NEUROLOGIC:  Alert and oriented x 3. No focal neuro deficits noted. PSYCHIATRIC:  Normal affect    ASSESSMENT:    1. Paroxysmal atrial fibrillation (HCC)   2. Cardiac risk counseling   3. Counseling on health promotion and disease prevention   4. Hypercholesterolemia   5. Essential hypertension  6. Medication management   7. Class 2 severe obesity due to excess calories with serious comorbidity and body mass index (BMI) of 37.0 to 37.9 in adult [facility])    PLAN:    Paroxysmal atrial fibrillation: -CHA2DS2/VAS Stroke Risk Points= 3  -on diltiazem, flecainide, metoprolol -on rivaroxaban for long term anticoagulation -reviewed risk of arrhythmia on flecainide with ischemia  Hypertension: elevated today. Goal <130/80 -monitor. Has home BP cuff but hasn't used often. Counseled on taking home BP -on metoprolol, diltiazem already -would consider ACEi/ARB given microalbumin and diabetes, but will need to be monitored closely given CKD and recent AKI. Most recent Cr 1.09, peak 1.85 in the hospital -instructed on home BP measurements  Hypercholesterolemia: given her diabetes, ASCVD risk, I would recommend that she be on a statin with LDL <100. -discussed pathology of cholesterol, how plaques form, that MI/CVA result commonly from  acute plaque rupture and not gradual stenosis. Discussed mechanism of statin to both decrease plaque accumulation and stabilize plaque that is already present. Discussed that calcium is a marker for plaque, with decades of validated data regarding average amounts of calcium for age/gender/ethnicity, as well as value of calcium score for risk stratification. She declined calcium score as we are going to start a statin -we discussed the data on statins, both in terms of their long term benefit as well as the risk of side effects. Reviewed common misconceptions about statins. Reviewed how we monitor treatment. After shared decision making, patient is agreeable to trialing statin. -will place future order to recheck lipids and CMP  Cardiac risk counseling and prevention recommendations: -recommend heart healthy/Mediterranean diet, with whole grains, fruits, vegetable, fish, lean meats, nuts, and olive oil. Limit salt. -recommend moderate walking, 3-5 times/week for 30-50 minutes each session. Aim for at least 150 minutes.week. Goal should be pace of 3 miles/hours, or walking 1.5 miles in 30 minutes -recommend avoidance of tobacco products. Avoid excess alcohol. -Additional risk factor control:  -Diabetes risk: A1c is uncontrolled. Per PCP  -Lipids: see above  -Blood pressure control: as above  -Weight: BMI 37 with risk factors, would benefit from weight loss -ASCVD risk score: The 10-year ASCVD risk score Denman George DC Montez Hageman., et al., 2013) is: 8.9%   Values used to calculate the score:     Age: 42 years     Sex: Female     Is Non-Hispanic African American: Yes     Diabetic: Yes     Tobacco smoker: No     Systolic Blood Pressure: 139 mmHg     Is BP treated: No     HDL Cholesterol: 52 mg/dL     Total Cholesterol: 183 mg/dL    Plan for follow up: 3 mos or sooner PRN  Total time of encounter: 63 minutes total time of encounter, including 43 minutes spent in face-to-face patient care. This time includes  coordination of care and counseling regarding atrial fibrillation, other symptoms, and CV risk. Remainder of non-face-to-face time involved reviewing chart documents/testing relevant to the patient encounter and documentation in the medical record.  Jodelle Red, MD, PhD [place]  [facility]    Medication Adjustments/Labs and Tests Ordered: Current medicines are reviewed at length with the patient today.  Concerns regarding medicines are outlined above.  Orders Placed This Encounter  Procedures  . Lipid panel  . Comprehensive metabolic panel  . EKG 12-Lead   Meds ordered this encounter  Medications  . atorvastatin (LIPITOR) 20 MG tablet    Sig: Take 1  tablet (20 mg total) by mouth daily.    Dispense:  90 tablet    Refill:  3    Patient Instructions  Medication Instructions:  Start Atorvastatin 20 mg daily  *If you need a refill on your cardiac medications before your next appointment, please call your pharmacy*  Lab Work: Your physician recommends that you return for lab work in 3 months prior to appointment ( Fasting Lipid, CMP).  If you have labs (blood work) drawn today and your tests are completely normal, you will receive your results only by: [name] MyChart Message (if you have MyChart) OR . A paper copy in the mail If you have any lab test that is abnormal or we need to change your treatment, we will call you to review the results.  Testing/Procedures: None  Follow-Up: At [facility], you and your health needs are our priority.  As part of our continuing mission to provide you with exceptional heart care, we have created designated Provider Care Teams.  These Care Teams include your primary Cardiologist (physician) and Advanced Practice Providers (APPs -  Physician Assistants and Nurse Practitioners) who all work together to provide you with the care you need, when you need it.  Your next appointment:   3 month(s)  The format for your next  appointment:   In Person  Provider:   Buford Dresser, MD      Signed, Buford Dresser, MD PhD 05/01/2019  [place]

## 2019-05-01 NOTE — Patient Instructions (Addendum)
Medication Instructions:  Start Atorvastatin 20 mg daily  *If you need a refill on your cardiac medications before your next appointment, please call your pharmacy*  Lab Work: Your physician recommends that you return for lab work in 3 months prior to appointment ( Fasting Lipid, CMP).  If you have labs (blood work) drawn today and your tests are completely normal, you will receive your results only by: [name] MyChart Message (if you have MyChart) OR . A paper copy in the mail If you have any lab test that is abnormal or we need to change your treatment, we will call you to review the results.  Testing/Procedures: None  Follow-Up: At [facility], you and your health needs are our priority.  As part of our continuing mission to provide you with exceptional heart care, we have created designated Provider Care Teams.  These Care Teams include your primary Cardiologist (physician) and Advanced Practice Providers (APPs -  Physician Assistants and Nurse Practitioners) who all work together to provide you with the care you need, when you need it.  Your next appointment:   3 month(s)  The format for your next appointment:   In Person  Provider:   Jodelle Red, MD

## 2019-05-05 ENCOUNTER — Other Ambulatory Visit: Payer: Self-pay | Admitting: Family Medicine

## 2019-05-05 ENCOUNTER — Telehealth: Payer: Self-pay

## 2019-05-05 DIAGNOSIS — H9193 Unspecified hearing loss, bilateral: Secondary | ICD-10-CM

## 2019-05-05 NOTE — Telephone Encounter (Signed)
ENT referral placed in follow-up of hearing loss

## 2019-05-05 NOTE — Telephone Encounter (Signed)
Spoke with pt requested an ENT referall if possible due to her progresing hearing loss. Thank you

## 2019-05-05 NOTE — Progress Notes (Signed)
Patient ID: Dawn Braun, female   DOB: 01-03-1966, 54 y.o.   MRN: [id number]  Phone message left by patient due to the complaint of hearing loss and requesting ENT referral which will be placed.

## 2019-05-16 ENCOUNTER — Other Ambulatory Visit: Payer: Self-pay

## 2019-05-16 ENCOUNTER — Encounter: Payer: Medicaid Other | Attending: Family Medicine | Admitting: Dietician

## 2019-05-16 ENCOUNTER — Encounter: Payer: Self-pay | Admitting: Dietician

## 2019-05-16 DIAGNOSIS — E1165 Type 2 diabetes mellitus with hyperglycemia: Secondary | ICD-10-CM | POA: Insufficient documentation

## 2019-05-16 DIAGNOSIS — E118 Type 2 diabetes mellitus with unspecified complications: Secondary | ICD-10-CM | POA: Insufficient documentation

## 2019-05-16 DIAGNOSIS — IMO0002 Reserved for concepts with insufficient information to code with codable children: Secondary | ICD-10-CM

## 2019-05-16 NOTE — Patient Instructions (Signed)
Remember our main goals from today:   Spread out your carbohydrate foods throughout the day, sticking with  3 servings of carbs per meal  2 or less servings of carbs per snack  Try to always eat a source of protein with meals and snacks   Aim for 150 minutes of physical activity per week. You are already well on your way at 3 days per week!

## 2019-05-16 NOTE — Progress Notes (Signed)
Diabetes Self-Management Education  Visit Type: First/Initial  Appt. Start Time: 8:00am  Appt. End Time: 9:20am  05/16/2019  [name], identified by name and date of birth, is a 54 y.o. female with a diagnosis of Diabetes: Type 2.   ASSESSMENT  Weight 204 lb (92.5 kg). Body mass index is 37.31 kg/m.   Patient reports that, for the past 2 weeks, BG readings have been in the 110s to 130s. Patient checks BG once per day, fasting.   Patient states she has cut out fried foods since December (was hospitalized with A1c of 13.6% among other complications.) Likes soda but has switched to drinking diet ginger ale and diet juices. Snacks typically include peanuts, fruit, popcorn, sugar-free blueberry muffins. Meals may include baked pork chops, hamburgers, baked chicken, shrimp with sides such as broccoli, salad, asparagus, corn, green beans w/ olive oil. Typical meal pattern is 3 meals per day plus 1-2 snacks. Tries to avoid potatoes and rice.    Diabetes Self-Management Education - 05/16/19 0810      Visit Information   Visit Type  First/Initial      Initial Visit   Diabetes Type  Type 2    Are you currently following a meal plan?  No    Are you taking your medications as prescribed?  Yes    Date Diagnosed  2005      Health Coping   How would you rate your overall health?  Excellent      Psychosocial Assessment   Patient Belief/Attitude about Diabetes  Motivated to manage diabetes    Self-care barriers  None    Self-management support  Family    Other persons present  Patient    Patient Concerns  Nutrition/Meal planning    Special Needs  None    Preferred Learning Style  No preference indicated    Learning Readiness  Ready    How often do you need to have someone help you when you read instructions, pamphlets, or other written materials from your doctor or pharmacy?  1 - Never    What is the last grade level you completed in school?  college graduate      Complications   Last HgB A1C per patient/outside source  13.6 %   03/26/2019   How often do you check your blood sugar?  1-2 times/day    Fasting Blood glucose range (mg/dL)  130-179;70-129    Number of hypoglycemic episodes per month  1    Can you tell when your blood sugar is low?  Yes    What do you do if your blood sugar is low?  drink OJ    Have you had a dilated eye exam in the past 12 months?  No    Have you had a dental exam in the past 12 months?  No    Are you checking your feet?  Yes    How many days per week are you checking your feet?  5      Dietary Intake   Breakfast  carnation instant breakfast (low-sugar) + 2% milk    Snack (morning)  pear    Lunch  [place] salad    Snack (afternoon)  flaming hot popcorn    Dinner  baked chicken & rice casserole    Beverage(s)  water, diet juice, diet ginger ale, red wine (1 glass/month)      Exercise   Exercise Type  ADL's;Light (walking / raking leaves)    How many days  per week to you exercise?  3    How many minutes per day do you exercise?  25    Total minutes per week of exercise  75      Patient Education   Previous Diabetes Education  Yes (please comment)    Disease state   Definition of diabetes, type 1 and 2, and the diagnosis of diabetes    Nutrition management   Role of diet in the treatment of diabetes and the relationship between the three main macronutrients and blood glucose level;Food label reading, portion sizes and measuring food.;Carbohydrate counting;Information on hints to eating out and maintain blood glucose control.;Meal options for control of blood glucose level and chronic complications.    Physical activity and exercise   Role of exercise on diabetes management, blood pressure control and cardiac health.    Monitoring  Taught/evaluated SMBG meter.;Purpose and frequency of SMBG.;Taught/discussed recording of test results and interpretation of SMBG.;Identified appropriate SMBG and/or A1C goals.;Yearly dilated  eye exam    Acute complications  Taught treatment of hypoglycemia - the 15 rule.;Discussed and identified patients' treatment of hyperglycemia.      Individualized Goals (developed by patient)   Nutrition  Follow meal plan discussed    Physical Activity  Exercise 3-5 times per week;30 minutes per day    Medications  take my medication as prescribed    Monitoring   test my blood glucose as discussed      Outcomes   Expected Outcomes  Demonstrated interest in learning. Expect positive outcomes    Future DMSE  PRN       Individualized Plan for Diabetes Self-Management Training:   Learning Objective:  Patient will have a greater understanding of diabetes self-management. Patient education plan is to attend individual and/or group sessions per assessed needs and concerns.   Plan:  Patient Instructions  Remember our main goals from today:   Spread out your carbohydrate foods throughout the day, sticking with  3 servings of carbs per meal  2 or less servings of carbs per snack  Try to always eat a source of protein with meals and snacks   Aim for 150 minutes of physical activity per week. You are already well on your way at 3 days per week!   Expected Outcomes:  Demonstrated interest in learning. Expect positive outcomes  Education material provided: ADA - How to Thrive: A Guide for Your Journey with Diabetes, A1C conversion sheet, Meal plan card, My Plate, Snack sheet and Carbohydrate counting sheet  If problems or questions, patient to contact team via: Phone and Email  Future DSME appointment: PRN

## 2019-05-23 ENCOUNTER — Ambulatory Visit: Payer: Medicaid Other | Attending: Family Medicine | Admitting: Pharmacist

## 2019-05-23 ENCOUNTER — Other Ambulatory Visit: Payer: Self-pay

## 2019-05-23 VITALS — BP 124/86 | HR 65

## 2019-05-23 DIAGNOSIS — I129 Hypertensive chronic kidney disease with stage 1 through stage 4 chronic kidney disease, or unspecified chronic kidney disease: Secondary | ICD-10-CM | POA: Insufficient documentation

## 2019-05-23 DIAGNOSIS — Z833 Family history of diabetes mellitus: Secondary | ICD-10-CM | POA: Diagnosis not present

## 2019-05-23 DIAGNOSIS — E118 Type 2 diabetes mellitus with unspecified complications: Secondary | ICD-10-CM

## 2019-05-23 DIAGNOSIS — N189 Chronic kidney disease, unspecified: Secondary | ICD-10-CM | POA: Insufficient documentation

## 2019-05-23 DIAGNOSIS — Z794 Long term (current) use of insulin: Secondary | ICD-10-CM | POA: Diagnosis not present

## 2019-05-23 DIAGNOSIS — E11649 Type 2 diabetes mellitus with hypoglycemia without coma: Secondary | ICD-10-CM | POA: Diagnosis not present

## 2019-05-23 DIAGNOSIS — E1122 Type 2 diabetes mellitus with diabetic chronic kidney disease: Secondary | ICD-10-CM | POA: Insufficient documentation

## 2019-05-23 DIAGNOSIS — Z8249 Family history of ischemic heart disease and other diseases of the circulatory system: Secondary | ICD-10-CM | POA: Diagnosis not present

## 2019-05-23 DIAGNOSIS — E1165 Type 2 diabetes mellitus with hyperglycemia: Secondary | ICD-10-CM | POA: Diagnosis not present

## 2019-05-23 DIAGNOSIS — Z841 Family history of disorders of kidney and ureter: Secondary | ICD-10-CM | POA: Diagnosis not present

## 2019-05-23 DIAGNOSIS — IMO0002 Reserved for concepts with insufficient information to code with codable children: Secondary | ICD-10-CM

## 2019-05-23 DIAGNOSIS — Z79899 Other long term (current) drug therapy: Secondary | ICD-10-CM | POA: Diagnosis not present

## 2019-05-23 MED ORDER — LISINOPRIL 2.5 MG PO TABS: 2.5000 mg | ORAL_TABLET | Freq: Every day | ORAL | 1 refills | Status: DC

## 2019-05-23 NOTE — Progress Notes (Signed)
    S:    PCP: Dr. Jillyn Hidden  No chief complaint on file.  Patient arrives in good spirits.  Presents for diabetes evaluation, education, and management Patient was referred and last seen by Primary Care Provider on 04/27/19. At that visit, Dr. Jillyn Hidden started metformin.   Patient reports Diabetes was diagnosed ~16 years ago.   Family/Social History:  - FHx: DM, kidney disease, HTN - Tobacco: never smoker - Alcohol: denies use   Insurance coverage/medication affordability: [place] [employer]  Patient reports adherence with medications.  Current diabetes medications include: metformin 500 mg qAM, Novolin 70/30 9 units BID Current hyperlipidemia medications include: atorvastatin 20 mg daily   Patient denies hypoglycemic events.  Patient reported dietary habits:  - Recently seen by a dietician  - Has increased non-starchy vegetables and baked proteins - Reports switching to diet sodas and juices    Patient-reported exercise habits:  -  Spends time daily on her exercise bike    Patient denies nocturia (nighttime urination).  Patient denies neuropathy (nerve pain). Patient denies visual changes. Patient reports self foot exams.     O:  Lab Results  Component Value Date   HGBA1C 13.6 (H) 03/26/2019   Vitals:   05/23/19 0925  BP: 124/86  Pulse: 65   Lipid Panel     Component Value Date/Time   CHOL 183 04/27/2019 1039   TRIG 77 04/27/2019 1039   HDL 52 04/27/2019 1039   CHOLHDL 3.5 04/27/2019 1039   LDLCALC 117 (H) 04/27/2019 1039   Home CBG avgs:  14-day: 140 30-day: 140  Clinical Atherosclerotic Cardiovascular Disease (ASCVD): No  The 10-year ASCVD risk score Denman George DC Jr., et al., 2013) is: 9.4%   Values used to calculate the score:     Age: 54 years     Sex: Female     Is Non-Hispanic African American: Yes     Diabetic: Yes     Tobacco smoker: No     Systolic Blood Pressure: 124 mmHg     Is BP treated: Yes     HDL Cholesterol: 52 mg/dL     Total Cholesterol: 183  mg/dL    A/P: Diabetes longstanding currently uncontrolled. Patient is able to verbalize appropriate hypoglycemia management plan. Patient is adherent with medication. Home CBGs are close to goal. No changes given improvement. Of note, with her CKD and albuminuria, she would benefit from an SGLT-2 inhibitor. Would have to be cautious and monitor closely given her history of AKI and electrolyte abnormalities.  -Continued current regimen.  -Extensively discussed pathophysiology of diabetes, recommended lifestyle interventions, dietary effects on blood sugar control -Counseled on s/sx of and management of hypoglycemia -Next A1C anticipated 06/2019.   ASCVD risk - primary prevention in patient with diabetes. Last LDL is not controlled. ASCVD risk score is not >20%  - moderate intensity statin indicated.  -Continued atorvastatin 20 mg.   Hypertension longstanding currently close to goal.  Blood pressure goal = <130/80 mmHg. Patient endorses med compliance. Given her microalbumin:creatinine ratio and hx of CKD, will add low-dose lisinopril. Will get labs in 2-3 weeks on follow-up. -Start lisinopril 2.5 mg daily.   Written patient instructions provided.  Total time in face to face counseling 30 minutes.   Follow up Pharmacist Clinic Visit in 2-3 weeks.   Butch Penny, PharmD, CPP Clinical Pharmacist [facility] [phone]

## 2019-05-27 ENCOUNTER — Other Ambulatory Visit: Payer: Self-pay | Admitting: Otolaryngology

## 2019-05-27 DIAGNOSIS — H903 Sensorineural hearing loss, bilateral: Secondary | ICD-10-CM

## 2019-05-27 DIAGNOSIS — H905 Unspecified sensorineural hearing loss: Secondary | ICD-10-CM

## 2019-05-29 ENCOUNTER — Other Ambulatory Visit (HOSPITAL_COMMUNITY): Payer: Self-pay | Admitting: *Deleted

## 2019-05-29 MED ORDER — FLECAINIDE ACETATE 100 MG PO TABS: 100.0000 mg | ORAL_TABLET | Freq: Two times a day (BID) | ORAL | 0 refills | Status: DC

## 2019-06-07 ENCOUNTER — Encounter (HOSPITAL_COMMUNITY): Payer: Self-pay | Admitting: Nurse Practitioner

## 2019-06-07 ENCOUNTER — Ambulatory Visit (HOSPITAL_COMMUNITY)
Admission: RE | Admit: 2019-06-07 | Discharge: 2019-06-07 | Disposition: A | Discharge status: Home / Self Care | Payer: Medicaid Other | Source: Ambulatory Visit | Attending: Nurse Practitioner | Admitting: Nurse Practitioner

## 2019-06-07 ENCOUNTER — Other Ambulatory Visit: Payer: Self-pay

## 2019-06-07 VITALS — BP 130/78 | HR 64 | Ht 62.0 in | Wt 204.8 lb

## 2019-06-07 DIAGNOSIS — Z841 Family history of disorders of kidney and ureter: Secondary | ICD-10-CM | POA: Insufficient documentation

## 2019-06-07 DIAGNOSIS — I480 Paroxysmal atrial fibrillation: Secondary | ICD-10-CM

## 2019-06-07 DIAGNOSIS — E119 Type 2 diabetes mellitus without complications: Secondary | ICD-10-CM | POA: Insufficient documentation

## 2019-06-07 DIAGNOSIS — Z820 Family history of epilepsy and other diseases of the nervous system: Secondary | ICD-10-CM | POA: Diagnosis not present

## 2019-06-07 DIAGNOSIS — Z7901 Long term (current) use of anticoagulants: Secondary | ICD-10-CM | POA: Insufficient documentation

## 2019-06-07 DIAGNOSIS — Z833 Family history of diabetes mellitus: Secondary | ICD-10-CM | POA: Insufficient documentation

## 2019-06-07 DIAGNOSIS — D6869 Other thrombophilia: Secondary | ICD-10-CM

## 2019-06-07 DIAGNOSIS — Z79899 Other long term (current) drug therapy: Secondary | ICD-10-CM | POA: Diagnosis not present

## 2019-06-07 DIAGNOSIS — I10 Essential (primary) hypertension: Secondary | ICD-10-CM | POA: Diagnosis not present

## 2019-06-07 DIAGNOSIS — Z794 Long term (current) use of insulin: Secondary | ICD-10-CM | POA: Insufficient documentation

## 2019-06-07 DIAGNOSIS — Z8249 Family history of ischemic heart disease and other diseases of the circulatory system: Secondary | ICD-10-CM | POA: Diagnosis not present

## 2019-06-07 DIAGNOSIS — I4891 Unspecified atrial fibrillation: Secondary | ICD-10-CM | POA: Diagnosis present

## 2019-06-07 MED ORDER — FLECAINIDE ACETATE 100 MG PO TABS: 100.0000 mg | ORAL_TABLET | Freq: Two times a day (BID) | ORAL | 11 refills | Status: DC

## 2019-06-07 NOTE — Progress Notes (Addendum)
Primary Care Physician: Cain Saupe, MD Referring Physician: ER f/u   Dawn Braun is a 54 y.o. female with a h/o afib on flecainide with  gallbladder surgery 12/2016. Her afib was quiet during the surgery. She has recovered nicely. Continues on flecainide and eliquis 5 mg bid.  F/u in afib clinic, 4/23. She had hospitalization x 2 in February 2019 for persistent vomiting. She was checked for gut motility issues and for retained stones, no issues found to describe the vomiting. Pt is contributing to [name] as when she takes after a few hours, she will vomit. Because of this, she is not taking [name]. She is establishing with a new PCP in May and will further discuss. She is trying to be super careful with her diet. For unclear reasons, after last hospitalization, she was told to stop flecainide on d/c instructions. However, she has continues to take.She has not had any afib during the GB surgery in the fall or recently with the vomiting.  F/u in afib clinic, 06/06/29. I have not seen for a year and she iw here to get flecainide refills. She reports one short episode  of afib, in January, resolved with extra rate control med. SHe was in the hospital over Christmas  for BO, resolved with surgery,S and is feeling much better. Received an exercise bike for Christmas and feels she is improving her exercise tolerance. Contiues on xarelto for a CHA2DS2VASc score of 2.    Today, she denies symptoms of palpitations, chest pain, shortness of breath, orthopnea, PND, lower extremity edema, dizziness, presyncope, syncope, or neurologic sequela. The patient is tolerating medications without difficulties and is otherwise without complaint today.   Past Medical History:  Diagnosis Date  . Asthma   . Complication of anesthesia    "takes me awhile to wakeup" (05/17/2017)  . Hypertension   . Intractable nausea and vomiting   . Paroxysmal atrial fibrillation (HCC)   . Type II diabetes mellitus (HCC)     Past Surgical History:  Procedure Laterality Date  . CESAREAN SECTION  2003  . CHOLECYSTECTOMY N/A 12/23/2016   Procedure: LAPAROSCOPIC CHOLECYSTECTOMY;  Surgeon: Sheliah Hatch De Blanch, MD;  Location: [facility];  Service: General;  Laterality: N/A;  . EYE SURGERY Right ~ 2016   "bleeding issues; lasered; cauterized leaks"  . KNEE ARTHROSCOPY Right X 2  . LAPAROTOMY N/A 03/26/2019   Procedure: EXPLORATORY LAPAROTOMY;  Surgeon: Darnell Level, MD;  Location: [facility];  Service: General;  Laterality: N/A;  . LYSIS OF ADHESION N/A 03/26/2019   Procedure: LYSIS OF ADHESION;  Surgeon: Darnell Level, MD;  Location: [facility];  Service: General;  Laterality: N/A;  . SPLIT NIGHT STUDY  08/06/2015  . TUBAL LIGATION  2003    Current Outpatient Medications  Medication Sig Dispense Refill  . atorvastatin (LIPITOR) 20 MG tablet Take 1 tablet (20 mg total) by mouth daily. 90 tablet 3  . diltiazem (CARDIZEM CD) 120 MG 24 hr capsule Take 1 capsule (120 mg total) by mouth daily. 30 capsule 5  . flecainide (TAMBOCOR) 100 MG tablet Take 1 tablet (100 mg total) by mouth 2 (two) times daily. 60 tablet 11  . Insulin Isophane & Regular Human (NOVOLIN 70/30 FLEXPEN) (70-30) 100 UNIT/ML PEN Inject 9 Units into the skin 2 (two) times daily. 5.4 mL 0  . Insulin Pen Needle 31G X 4 MM MISC 1 Container by Does not apply route 2 (two) times daily. 100 each 0  . lisinopril (ZESTRIL) 2.5 MG tablet  Take 1 tablet (2.5 mg total) by mouth daily. 90 tablet 1  . metFORMIN (GLUCOPHAGE) 500 MG tablet Take once per day after evening meal (Patient taking differently: 1,000 mg. Take one tablet 2 times per day) 90 tablet 3  . metoprolol tartrate (LOPRESSOR) 25 MG tablet Take 1 tablet (25 mg total) by mouth 2 (two) times daily. 60 tablet 0  . rivaroxaban (XARELTO) 20 MG TABS tablet Take 1 tablet (20 mg total) by mouth daily with supper. 30 tablet 5   No current facility-administered medications for this encounter.    No Known  Allergies  Social History   Socioeconomic History  . Marital status: Single    Spouse name: Not on file  . Number of children: 2  . Years of education: Not on file  . Highest education level: Not on file  Occupational History  . Occupation: Radio broadcast assistant  Tobacco Use  . Smoking status: Never Smoker  . Smokeless tobacco: Never Used  Substance and Sexual Activity  . Alcohol use: Yes    Alcohol/week: 1.0 standard drinks    Types: 1 Glasses of wine per week    Comment: 05/17/2017 "glass of wine q other weekend"  . Drug use: No  . Sexual activity: Yes    Birth control/protection: Surgical  Other Topics Concern  . Not on file  Social History Narrative  . Not on file   Social Determinants of Health   Financial Resource Strain:   . Difficulty of Paying Living Expenses: Not on file  Food Insecurity:   . Worried About Programme researcher, broadcasting/film/video in the Last Year: Not on file  . Ran Out of Food in the Last Year: Not on file  Transportation Needs:   . Lack of Transportation (Medical): Not on file  . Lack of Transportation (Non-Medical): Not on file  Physical Activity:   . Days of Exercise per Week: Not on file  . Minutes of Exercise per Session: Not on file  Stress:   . Feeling of Stress : Not on file  Social Connections:   . Frequency of Communication with Friends and Family: Not on file  . Frequency of Social Gatherings with Friends and Family: Not on file  . Attends Religious Services: Not on file  . Active Member of Clubs or Organizations: Not on file  . Attends Banker Meetings: Not on file  . Marital Status: Not on file  Intimate Partner Violence:   . Fear of Current or Ex-Partner: Not on file  . Emotionally Abused: Not on file  . Physically Abused: Not on file  . Sexually Abused: Not on file    Family History  Problem Relation Age of Onset  . Epilepsy Father   . Diabetes Mother   . Kidney disease Mother   . Hypertension Mother   . Kidney disease  Brother   . Diabetic kidney disease Brother   . Diabetes Brother     ROS- All systems are reviewed and negative except as per the HPI above  Physical Exam: Vitals:   06/07/19 0843  BP: 130/78  Pulse: 64  Weight: 92.9 kg  Height: 5\' 2"  (1.575 m)   Wt Readings from Last 3 Encounters:  06/07/19 92.9 kg  05/16/19 92.5 kg  05/01/19 93 kg    Labs: Lab Results  Component Value Date   NA 143 04/27/2019   K 4.7 04/27/2019   CL 107 (H) 04/27/2019   CO2 21 04/27/2019   GLUCOSE 153 (H) 04/27/2019  BUN 22 04/27/2019   CREATININE 1.09 (H) 04/27/2019   CALCIUM 10.0 04/27/2019   PHOS 2.9 05/20/2017   MG 1.6 (L) 01/02/2017   Lab Results  Component Value Date   INR 1.3 (H) 04/01/2019   Lab Results  Component Value Date   CHOL 183 04/27/2019   HDL 52 04/27/2019   LDLCALC 117 (H) 04/27/2019   TRIG 77 04/27/2019     GEN- The patient is well appearing, alert and oriented x 3 today.   Head- normocephalic, atraumatic Eyes-  Sclera clear, conjunctiva pink Ears- hearing intact Oropharynx- clear Neck- supple, no JVP Lymph- no cervical lymphadenopathy Lungs- Clear to ausculation bilaterally, normal work of breathing Heart- Regular rate and rhythm, no murmurs, rubs or gallops, PMI not laterally displaced GI- soft, NT, ND, + BS Extremities- no clubbing, cyanosis, or edema MS- no significant deformity or atrophy Skin- no rash or lesion Psych- euthymic mood, full affect Neuro- strength and sensation are intact  EKG- NSR at 64  bpm, pr int 226 ms, qrs int 92 ms, qtc 439 ms Epic records reviewed    Assessment and Plan: 1. Paroxysmal afib Quiet  on flecainide 100 mg bid Continue metoprolol 25 mg bid/ dilt 120 mg qd Continue xarelto 20 mg daily for a chadsvasc score of 2(female, DM)  2. DM  Per PCP  F/u in one year  Butch Penny C. Reba Hulett, [facility] [address] [place], [place] [address] [phone]

## 2019-06-12 NOTE — Progress Notes (Unsigned)
S:    PCP: Dr. Jillyn Hidden No chief complaint on file.  Patient arrives ***.  Presents for diabetes evaluation, education, and management Patient was referred and last seen by Primary Care Provider on 04/27/19 and was last seen by Clinical Pharmacist on 05/23/19. At that visit, a low-dose lisinopril was added for renal protection and no changes were made to diabetes medications.  Patient reports Diabetes was diagnosed  ~16 years ago.   Family/Social History:  - FHx: DM, kidney disease, HTN - Tobacco: never smoker - Alcohol: denies use   Insurance coverage/medication affordability: [place] [employer]  Patient {Actions; denies-reports:120008} adherence with medications.   Check A1C - last one 03/2020 = 13.6% Check Clinic BG Review medications and adherence (timing of meds, etc.)  Ate or drank anything today? At home BGs? (highs vs lows) Hyperglycemia sx (nocturia, neuropathy, visual changes, foot exams) Hypoglycemia symptoms Medication management -discussed possibly SGLT-2 given CKD and diabetes  Current diabetes medications include: Metformin 500 mg qAM, Novolin 70/30 9 units BID Current hypertension medications include: Lisinopril 2.5 mg daily Current hyperlipidemia medications include: atorvastatin 20 mg daily  Patient {Actions; denies-reports:120008} hypoglycemic events.  Patient reported dietary habits:  - Recently seen by a dietician  - Has increased non-starchy vegetables and baked proteins - Reports switching to diet sodas and juices    Patient-reported exercise habits:  -  Spends time daily on her exercise bike    Patient {Actions; denies-reports:120008} nocturia (nighttime urination).  Patient {Actions; denies-reports:120008} neuropathy (nerve pain). Patient {Actions; denies-reports:120008} visual changes. Patient {Actions; denies-reports:120008} self foot exams.   O:    Lab Results  Component Value Date   HGBA1C 13.6 (H) 03/26/2019   There were no vitals filed  for this visit.  Lipid Panel     Component Value Date/Time   CHOL 183 04/27/2019 1039   TRIG 77 04/27/2019 1039   HDL 52 04/27/2019 1039   CHOLHDL 3.5 04/27/2019 1039   LDLCALC 117 (H) 04/27/2019 1039    Home fasting blood sugars: ***  2 hour post-meal/random blood sugars: ***.   Clinical Atherosclerotic Cardiovascular Disease (ASCVD): No  The 10-year ASCVD risk score Denman George DC Jr., et al., 2013) is: 11%   Values used to calculate the score:     Age: 54 years     Sex: Female     Is Non-Hispanic African American: Yes     Diabetic: Yes     Tobacco smoker: No     Systolic Blood Pressure: 130 mmHg     Is BP treated: Yes     HDL Cholesterol: 52 mg/dL     Total Cholesterol: 183 mg/dL    A/P: Diabetes longstanding*** currently ***. Patient is not*** able to verbalize appropriate hypoglycemia management plan. Patient {Is/is not:9024} adherent with medication. Control is suboptimal due to ***. -{Meds adjust:18428} basal insulin *** (insulin ***). Patient will continue to titrate 1 unit every ***days if fasting blood sugar > 100mg /dl until fasting blood sugars reach goal or next visit.  -{Meds adjust:18428}  rapid insulin *** (insulin ***) to ***.  -{Meds adjust:18428} GLP-1 *** (generic name***) to ***.  -{Meds adjust:18428} SGLT2-I *** (generic name***) to ***. Counseled on sick day rules for ***. -Extensively discussed pathophysiology of diabetes, recommended lifestyle interventions, dietary effects on blood sugar control -Counseled on s/sx of and management of hypoglycemia -Next A1C anticipated ***.   ASCVD risk - primary prevention in patient with diabetes. Last LDL is not controlled. ASCVD risk score is not >20%  - moderate  intensity statin indicated.  -Continued atorvastatin 20 mg.   Hypertension longstanding*** currently ***.  Blood pressure goal = *** mmHg. Patient medication adherence ***.  Blood pressure control is suboptimal due to ***. -***  Written patient  instructions provided.  Total time in face to face counseling *** minutes.   Follow up Pharmacist/PCP*** Clinic Visit in ***.     Patient seen with: Lorel Monaco, PharmD PGY1 Ambulatory Care Resident [facility]

## 2019-06-13 ENCOUNTER — Ambulatory Visit: Payer: Medicaid Other | Admitting: Pharmacist

## 2019-06-19 ENCOUNTER — Other Ambulatory Visit: Payer: Self-pay

## 2019-06-19 ENCOUNTER — Ambulatory Visit
Admission: RE | Admit: 2019-06-19 | Discharge: 2019-06-19 | Disposition: A | Discharge status: Home / Self Care | Payer: Medicaid Other | Source: Ambulatory Visit | Attending: Otolaryngology | Admitting: Otolaryngology

## 2019-06-19 DIAGNOSIS — H905 Unspecified sensorineural hearing loss: Secondary | ICD-10-CM

## 2019-06-19 DIAGNOSIS — H903 Sensorineural hearing loss, bilateral: Secondary | ICD-10-CM

## 2019-06-19 MED ORDER — GADOBENATE DIMEGLUMINE 529 MG/ML IV SOLN
19.0000 mL | Freq: Once | INTRAVENOUS | Status: AC | PRN
  Administered 2019-06-19: 19 mL via INTRAVENOUS

## 2019-06-21 ENCOUNTER — Telehealth: Payer: Self-pay | Admitting: Family Medicine

## 2019-06-21 MED ORDER — NOVOLIN 70/30 FLEXPEN (70-30) 100 UNIT/ML ~~LOC~~ SUPN: 9.0000 [IU] | PEN_INJECTOR | Freq: Two times a day (BID) | SUBCUTANEOUS | 0 refills | Status: DC

## 2019-06-21 NOTE — Telephone Encounter (Signed)
Rx sent 

## 2019-06-21 NOTE — Telephone Encounter (Signed)
1) Medication(s) Requested (by name): Insulin Isophane & Regular Human (NOVOLIN 70/30 FLEXPEN) (70-30) 100 UNIT/ML PEN [[id number]] ENDED  2) Pharmacy of Choice: [facility] on [address]  Seen primary dr in January 2021

## 2019-06-24 ENCOUNTER — Other Ambulatory Visit: Payer: Self-pay

## 2019-06-24 ENCOUNTER — Emergency Department (HOSPITAL_COMMUNITY)
Admission: EM | Admit: 2019-06-24 | Discharge: 2019-06-24 | Disposition: A | Discharge status: Home / Self Care | Payer: Medicaid Other | Attending: Emergency Medicine | Admitting: Emergency Medicine

## 2019-06-24 ENCOUNTER — Encounter (HOSPITAL_COMMUNITY): Payer: Self-pay | Admitting: Emergency Medicine

## 2019-06-24 DIAGNOSIS — E1122 Type 2 diabetes mellitus with diabetic chronic kidney disease: Secondary | ICD-10-CM | POA: Insufficient documentation

## 2019-06-24 DIAGNOSIS — L03116 Cellulitis of left lower limb: Secondary | ICD-10-CM

## 2019-06-24 DIAGNOSIS — N183 Chronic kidney disease, stage 3 unspecified: Secondary | ICD-10-CM | POA: Diagnosis not present

## 2019-06-24 DIAGNOSIS — Z79899 Other long term (current) drug therapy: Secondary | ICD-10-CM | POA: Insufficient documentation

## 2019-06-24 DIAGNOSIS — J45909 Unspecified asthma, uncomplicated: Secondary | ICD-10-CM | POA: Insufficient documentation

## 2019-06-24 DIAGNOSIS — Z794 Long term (current) use of insulin: Secondary | ICD-10-CM | POA: Diagnosis not present

## 2019-06-24 DIAGNOSIS — M79605 Pain in left leg: Secondary | ICD-10-CM | POA: Diagnosis present

## 2019-06-24 DIAGNOSIS — I129 Hypertensive chronic kidney disease with stage 1 through stage 4 chronic kidney disease, or unspecified chronic kidney disease: Secondary | ICD-10-CM | POA: Insufficient documentation

## 2019-06-24 LAB — CBC WITH DIFFERENTIAL/PLATELET
Abs Immature Granulocytes: 0.03 10*3/uL (ref 0.00–0.07)
Basophils Absolute: 0 10*3/uL (ref 0.0–0.1)
Basophils Relative: 0 %
Eosinophils Absolute: 0.3 10*3/uL (ref 0.0–0.5)
Eosinophils Relative: 3 %
HCT: 32.4 % — ABNORMAL LOW (ref 36.0–46.0)
Hemoglobin: 10.3 g/dL — ABNORMAL LOW (ref 12.0–15.0)
Immature Granulocytes: 0 %
Lymphocytes Relative: 39 %
Lymphs Abs: 4 10*3/uL (ref 0.7–4.0)
MCH: 27.3 pg (ref 26.0–34.0)
MCHC: 31.8 g/dL (ref 30.0–36.0)
MCV: 85.9 fL (ref 80.0–100.0)
Monocytes Absolute: 0.6 10*3/uL (ref 0.1–1.0)
Monocytes Relative: 6 %
Neutro Abs: 5.2 10*3/uL (ref 1.7–7.7)
Neutrophils Relative %: 52 %
Platelets: 361 10*3/uL (ref 150–400)
RBC: 3.77 MIL/uL — ABNORMAL LOW (ref 3.87–5.11)
RDW: 13.2 % (ref 11.5–15.5)
WBC: 10.2 10*3/uL (ref 4.0–10.5)
nRBC: 0 % (ref 0.0–0.2)

## 2019-06-24 LAB — BASIC METABOLIC PANEL
Anion gap: 8 (ref 5–15)
BUN: 40 mg/dL — ABNORMAL HIGH (ref 6–20)
CO2: 22 mmol/L (ref 22–32)
Calcium: 9 mg/dL (ref 8.9–10.3)
Chloride: 107 mmol/L (ref 98–111)
Creatinine, Ser: 1.27 mg/dL — ABNORMAL HIGH (ref 0.44–1.00)
GFR calc Af Amer: 55 mL/min — ABNORMAL LOW (ref >60)
GFR calc non Af Amer: 48 mL/min — ABNORMAL LOW (ref >60)
Glucose, Bld: 136 mg/dL — ABNORMAL HIGH (ref 70–99)
Potassium: 6 mmol/L — ABNORMAL HIGH (ref 3.5–5.1)
Sodium: 137 mmol/L (ref 135–145)

## 2019-06-24 LAB — CK: Total CK: 133 U/L (ref 38–234)

## 2019-06-24 LAB — POTASSIUM: Potassium: 4.8 mmol/L (ref 3.5–5.1)

## 2019-06-24 LAB — LACTIC ACID, PLASMA: Lactic Acid, Venous: 1 mmol/L (ref 0.5–1.9)

## 2019-06-24 MED ORDER — DOXYCYCLINE HYCLATE 100 MG PO CAPS: 100.0000 mg | ORAL_CAPSULE | Freq: Two times a day (BID) | ORAL | 0 refills | Status: DC

## 2019-06-24 MED ORDER — HYDROCODONE-ACETAMINOPHEN 5-325 MG PO TABS: 1.0000 | ORAL_TABLET | ORAL | 0 refills | Status: DC | PRN

## 2019-06-24 MED ORDER — SODIUM CHLORIDE 0.9 % IV SOLN
1.0000 g | Freq: Once | INTRAVENOUS | Status: AC
  Administered 2019-06-24: 1 g via INTRAVENOUS
  Filled 2019-06-24: qty 10

## 2019-06-24 MED ORDER — HYDROCODONE-ACETAMINOPHEN 5-325 MG PO TABS
1.0000 | ORAL_TABLET | Freq: Once | ORAL | Status: AC
  Administered 2019-06-24: 1 via ORAL
  Filled 2019-06-24: qty 1

## 2019-06-24 NOTE — ED Provider Notes (Signed)
[facility] DEPT Provider Note   CSN: [id number] Arrival date & time: 06/24/19  1850    History Chief Complaint  Patient presents with  . Leg Pain    left upper     Dawn Braun is a 54 y.o. female with small history significant for A. fib, diabetes, CKD, on chronic anticoagulation presents for evaluation of thigh pain.  Patient has anterior thigh pain x2 weeks and is worsening.  Has noticed some erythema and some warmth to her anterior thigh.  States that she had pain at her groin 2 weeks ago however none currently.  Is concerned about a blood clot.  She has been trying with her Xarelto.  Is here, chills, nausea, vomiting, chest pain, shortness of breath, paresthesias, swelling, recent falls or injury.  Measures aggravating alleviating factors.  Rates her pain a 10/10.  Has been trying Tylenol, ibuprofen, icy hot, heating pads without relief.  History obtained from patient and past medical records.  No interpreter is used.  HPI     Past Medical History:  Diagnosis Date  . Asthma   . Complication of anesthesia    "takes me awhile to wakeup" (05/17/2017)  . Hypertension   . Intractable nausea and vomiting   . Paroxysmal atrial fibrillation (HCC)   . Type II diabetes mellitus [facility])     Patient Active Problem List   Diagnosis Date Noted  . CKD (chronic kidney disease) stage 3, GFR 30-59 ml/min 03/28/2019  . Obesity (BMI 30-39.9) 03/28/2019  . Chronic anticoagulation 03/28/2019  . Closed loop SBO (small bowel obstruction) s/p exlap /lysis of adhesions 03/26/2019 03/25/2019  . Acute renal failure superimposed on stage 3 chronic kidney disease ([place]) 09/19/2018  . DKA (diabetic ketoacidoses) ([place]) 09/18/2018  . Leukocytosis 05/17/2017  . Hypokalemia 01/02/2017  . SIRS (systemic inflammatory response syndrome) ([place]) 12/31/2016  . Hemoperitoneum 12/31/2016  . Intractable nausea and vomiting 12/31/2016  . AKI (acute kidney injury) ([place])  12/31/2016  . Paroxysmal atrial fibrillation (HCC)   . Asthma   . Diabetes mellitus type 2, uncontrolled, with complications ([place]) 78/29/5621  . Menopausal symptom 05/01/2016    Past Surgical History:  Procedure Laterality Date  . CESAREAN SECTION  2003  . CHOLECYSTECTOMY N/A 12/23/2016   Procedure: LAPAROSCOPIC CHOLECYSTECTOMY;  Surgeon: Kieth Brightly Arta Bruce, MD;  Location: [facility];  Service: General;  Laterality: N/A;  . EYE SURGERY Right ~ 2016   "bleeding issues; lasered; cauterized leaks"  . KNEE ARTHROSCOPY Right X 2  . LAPAROTOMY N/A 03/26/2019   Procedure: EXPLORATORY LAPAROTOMY;  Surgeon: Armandina Gemma, MD;  Location: [facility];  Service: General;  Laterality: N/A;  . LYSIS OF ADHESION N/A 03/26/2019   Procedure: LYSIS OF ADHESION;  Surgeon: Armandina Gemma, MD;  Location: [facility];  Service: General;  Laterality: N/A;  . SPLIT NIGHT STUDY  08/06/2015  . TUBAL LIGATION  2003     OB History   No obstetric history on file.     Family History  Problem Relation Age of Onset  . Epilepsy Father   . Diabetes Mother   . Kidney disease Mother   . Hypertension Mother   . Kidney disease Brother   . Diabetic kidney disease Brother   . Diabetes Brother     Social History   Tobacco Use  . Smoking status: Never Smoker  . Smokeless tobacco: Never Used  Substance Use Topics  . Alcohol use: Yes    Alcohol/week: 1.0 standard drinks    Types: 1  Glasses of wine per week    Comment: 05/17/2017 "glass of wine q other weekend"  . Drug use: No    Home Medications Prior to Admission medications   Medication Sig Start Date End Date Taking? Authorizing Provider  atorvastatin (LIPITOR) 20 MG tablet Take 1 tablet (20 mg total) by mouth daily. Patient taking differently: Take 20 mg by mouth every evening.  05/01/19 04/25/20 Yes Jodelle Red, MD  diltiazem (CARDIZEM CD) 120 MG 24 hr capsule Take 1 capsule (120 mg total) by mouth daily. 04/27/19 06/24/19 Yes Fulp, Cammie, MD  flecainide  (TAMBOCOR) 100 MG tablet Take 1 tablet (100 mg total) by mouth 2 (two) times daily. 06/07/19 07/07/19 Yes Newman Nip, NP  insulin isophane & regular human (NOVOLIN 70/30 FLEXPEN) (70-30) 100 UNIT/ML KwikPen Inject 9 Units into the skin 2 (two) times daily. 06/21/19 09/12/19 Yes Fulp, Cammie, MD  Insulin Pen Needle 31G X 4 MM MISC 1 Container by Does not apply route 2 (two) times daily. 04/01/19  Yes Briant Cedar, MD  lisinopril (ZESTRIL) 2.5 MG tablet Take 1 tablet (2.5 mg total) by mouth daily. 05/23/19  Yes Fulp, Cammie, MD  metFORMIN (GLUCOPHAGE) 500 MG tablet Take once per day after evening meal Patient taking differently: Take 500 mg by mouth 2 (two) times daily.  04/27/19  Yes Fulp, Cammie, MD  metoprolol tartrate (LOPRESSOR) 25 MG tablet Take 1 tablet (25 mg total) by mouth 2 (two) times daily. 04/01/19 06/24/19 Yes Briant Cedar, MD  rivaroxaban (XARELTO) 20 MG TABS tablet Take 1 tablet (20 mg total) by mouth daily with supper. 04/27/19 06/24/19 Yes Fulp, Cammie, MD  doxycycline (VIBRAMYCIN) 100 MG capsule Take 1 capsule (100 mg total) by mouth 2 (two) times daily. 06/24/19   Daton Szilagyi A, PA-C  HYDROcodone-acetaminophen (NORCO/VICODIN) 5-325 MG tablet Take 1 tablet by mouth every 4 (four) hours as needed. 06/24/19   Mitchell Iwanicki A, PA-C    Allergies    Patient has no known allergies.  Review of Systems   Review of Systems  Constitutional: Negative.   HENT: Negative.   Respiratory: Negative.   Cardiovascular: Negative.   Gastrointestinal: Negative.   Musculoskeletal:       Left thigh pain  Skin: Positive for color change.  Neurological: Negative.   All other systems reviewed and are negative.   Physical Exam Updated Vital Signs BP (!) 151/76   Pulse 73   Temp 98.5 F (36.9 C)   Resp 19   SpO2 100%   Physical Exam Vitals and nursing note reviewed.  Constitutional:      General: She is not in acute distress.    Appearance: She is well-developed. She is  not ill-appearing, toxic-appearing or diaphoretic.  HENT:     Head: Normocephalic and atraumatic.     Nose: Nose normal.     Mouth/Throat:     Mouth: Mucous membranes are moist.     Pharynx: Oropharynx is clear.  Eyes:     Pupils: Pupils are equal, round, and reactive to light.  Cardiovascular:     Rate and Rhythm: Normal rate.     Pulses: Normal pulses.          Radial pulses are 2+ on the right side and 2+ on the left side.       Femoral pulses are 2+ on the right side and 2+ on the left side.      Dorsalis pedis pulses are 2+ on the right side and 2+ on  the left side.       Posterior tibial pulses are 2+ on the right side and 2+ on the left side.     Heart sounds: Normal heart sounds.  Pulmonary:     Effort: Pulmonary effort is normal. No respiratory distress.     Breath sounds: Normal breath sounds.  Abdominal:     General: Bowel sounds are normal. There is no distension.  Musculoskeletal:        General: Normal range of motion.     Cervical back: Normal range of motion.     Comments: Compartments soft.  No bony tenderness.  No edema.  Denna Haggard' sign negative  Skin:    General: Skin is warm and dry.     Capillary Refill: Capillary refill takes less than 2 seconds.     Comments: Mild erythema and some warmth to left anterior thigh.  No fluctuance or induration.  Neurological:     General: No focal deficit present.     Mental Status: She is alert and oriented to person, place, and time.     Comments: Ambulatory without difficulty     ED Results / Procedures / Treatments   Labs (all labs ordered are listed, but only abnormal results are displayed) Labs Reviewed  CBC WITH DIFFERENTIAL/PLATELET - Abnormal; Notable for the following components:      Result Value   RBC 3.77 (*)    Hemoglobin 10.3 (*)    HCT 32.4 (*)    All other components within normal limits  BASIC METABOLIC PANEL - Abnormal; Notable for the following components:   Potassium 6.0 (*)    Glucose, Bld 136  (*)    BUN 40 (*)    Creatinine, Ser 1.27 (*)    GFR calc non Af Amer 48 (*)    GFR calc Af Amer 55 (*)    All other components within normal limits  LACTIC ACID, PLASMA  CK  POTASSIUM  LACTIC ACID, PLASMA    EKG None  Radiology No results found.  Procedures Procedures (including critical care time)  Medications Ordered in ED Medications  HYDROcodone-acetaminophen (NORCO/VICODIN) 5-325 MG per tablet 1 tablet (1 tablet Oral Given 06/24/19 2016)  cefTRIAXone (ROCEPHIN) 1 g in sodium chloride 0.9 % 100 mL IVPB (1 g Intravenous New Bag/Given 06/24/19 2232)    ED Course  I have reviewed the triage vital signs and the nursing notes.  Pertinent labs & imaging results that were available during my care of the patient were reviewed by me and considered in my medical decision making (see chart for details).  54 year old female appears otherwise well presents for evaluation of left anterior thigh pain.  She is afebrile, nonseptic, non-ill-appearing.  No bony tenderness.  Compartments soft.  She does have mild erythema warmth to her left anterior thigh.  Compliant with Xarelto for her A. fib.  No paresthesias.  No gross edema to her left lower extremity.  Denna Haggard' sign negative.  She has good femoral, PT, DP pulses bilaterally.  No pain or proportion to exam.  Question cellulitis however I do not see any gross abscess.  Unfortunately ultrasound not here at this time to rule out DVT.  She denies any chest pain or shortness of breath.  She has no tachycardia, tachypnea or hypoxia. Low suspicion for PE.  Labs reviewed interpreted: CBC without leukocytosis Metabolic panel with hyperkalemia at 6.0 however with moderate hemolysis, creatinine 1.27 Lactic acid 1.0 CK 1.33  Patient reassessed. Pain controlled. Discussed hyperkalemia. No hx.  Not on supplementation. EKG not pulling over into Epic however no EKG changes.  Unfortunately patient is a hard stick.  She was stuck 7 times return obtain  additional potassium.  This is currently pending.  Question her moderate hemolysis as cause of her hyperkalemia.  Repeat potassium within normal limits.  DC home with antibiotics for cellulitis.  Have low suspicion for myositis, compartment syndrome, acute bacterial infectious process, fracture, dislocation.  She is to follow-up outpatient tomorrow for ultrasound to rule out DVT  The patient has been appropriately medically screened and/or stabilized in the ED. I have low suspicion for any other emergent medical condition which would require further screening, evaluation or treatment in the ED or require inpatient management.  Patient is hemodynamically stable and in no acute distress.  Patient able to ambulate in department prior to ED.  Evaluation does not show acute pathology that would require ongoing or additional emergent interventions while in the emergency department or further inpatient treatment.  I have discussed the diagnosis with the patient and answered all questions.  Pain is been managed while in the emergency department and patient has no further complaints prior to discharge.  Patient is comfortable with plan discussed in room and is stable for discharge at this time.  I have discussed strict return precautions for returning to the emergency department.  Patient was encouraged to follow-up with PCP/specialist refer to at discharge.   Patient is been seen evaluated any physician, Dr. Denton Lank who agrees with treatment, plan and disposition.    MDM Rules/Calculators/A&P                       Final Clinical Impression(s) / ED Diagnoses Final diagnoses:  Left leg pain  Cellulitis of left lower extremity    Rx / DC Orders ED Discharge Orders         Ordered    LE VENOUS     06/24/19 2325    doxycycline (VIBRAMYCIN) 100 MG capsule  2 times daily     06/24/19 2329    HYDROcodone-acetaminophen (NORCO/VICODIN) 5-325 MG tablet  Every 4 hours PRN     06/24/19 2339             Kendarious Gudino A, PA-C 06/24/19 2340    Cathren Laine, MD 06/25/19 1514

## 2019-06-24 NOTE — ED Triage Notes (Signed)
Pt c/o left upper thigh pains for 2 weeks. Reports area is warm to touch. Has a fib and takes [place] Ramp, concerned for blood clot.

## 2019-06-24 NOTE — Discharge Instructions (Signed)
Return tomorrow for your ultrasound to look for a blood clot.  Take the antibiotics as prescribed.  Follow up with your Primary for new or worsening symptoms

## 2019-06-25 ENCOUNTER — Ambulatory Visit (HOSPITAL_COMMUNITY)
Admission: RE | Admit: 2019-06-25 | Discharge: 2019-06-25 | Disposition: A | Discharge status: Home / Self Care | Payer: Medicaid Other | Source: Ambulatory Visit | Attending: Emergency Medicine | Admitting: Emergency Medicine

## 2019-06-25 DIAGNOSIS — R609 Edema, unspecified: Secondary | ICD-10-CM

## 2019-06-25 NOTE — Progress Notes (Signed)
VASCULAR LAB PRELIMINARY  PRELIMINARY  PRELIMINARY  PRELIMINARY  Left lower extremity venous duplex completed.    Preliminary report:  See CV proc for preliminary results.   Kymberly Blomberg, RVT 06/25/2019, 2:27 PM

## 2019-07-16 ENCOUNTER — Other Ambulatory Visit: Payer: Self-pay

## 2019-07-16 ENCOUNTER — Emergency Department (HOSPITAL_BASED_OUTPATIENT_CLINIC_OR_DEPARTMENT_OTHER): Payer: Medicaid Other

## 2019-07-16 ENCOUNTER — Encounter (HOSPITAL_COMMUNITY): Payer: Self-pay

## 2019-07-16 ENCOUNTER — Emergency Department (HOSPITAL_COMMUNITY)
Admission: EM | Admit: 2019-07-16 | Discharge: 2019-07-16 | Disposition: A | Discharge status: Home / Self Care | Payer: Medicaid Other | Attending: Emergency Medicine | Admitting: Emergency Medicine

## 2019-07-16 DIAGNOSIS — M79652 Pain in left thigh: Secondary | ICD-10-CM | POA: Insufficient documentation

## 2019-07-16 DIAGNOSIS — J45909 Unspecified asthma, uncomplicated: Secondary | ICD-10-CM | POA: Diagnosis not present

## 2019-07-16 DIAGNOSIS — Z79899 Other long term (current) drug therapy: Secondary | ICD-10-CM | POA: Diagnosis not present

## 2019-07-16 DIAGNOSIS — I129 Hypertensive chronic kidney disease with stage 1 through stage 4 chronic kidney disease, or unspecified chronic kidney disease: Secondary | ICD-10-CM | POA: Insufficient documentation

## 2019-07-16 DIAGNOSIS — M79662 Pain in left lower leg: Secondary | ICD-10-CM | POA: Insufficient documentation

## 2019-07-16 DIAGNOSIS — Z7901 Long term (current) use of anticoagulants: Secondary | ICD-10-CM | POA: Diagnosis not present

## 2019-07-16 DIAGNOSIS — R52 Pain, unspecified: Secondary | ICD-10-CM | POA: Diagnosis not present

## 2019-07-16 DIAGNOSIS — R2242 Localized swelling, mass and lump, left lower limb: Secondary | ICD-10-CM | POA: Diagnosis not present

## 2019-07-16 DIAGNOSIS — R202 Paresthesia of skin: Secondary | ICD-10-CM | POA: Diagnosis not present

## 2019-07-16 DIAGNOSIS — E1122 Type 2 diabetes mellitus with diabetic chronic kidney disease: Secondary | ICD-10-CM | POA: Insufficient documentation

## 2019-07-16 DIAGNOSIS — M79605 Pain in left leg: Secondary | ICD-10-CM

## 2019-07-16 DIAGNOSIS — Z794 Long term (current) use of insulin: Secondary | ICD-10-CM | POA: Insufficient documentation

## 2019-07-16 DIAGNOSIS — N183 Chronic kidney disease, stage 3 unspecified: Secondary | ICD-10-CM | POA: Diagnosis not present

## 2019-07-16 DIAGNOSIS — M25562 Pain in left knee: Secondary | ICD-10-CM | POA: Insufficient documentation

## 2019-07-16 LAB — CBC WITH DIFFERENTIAL/PLATELET
Abs Immature Granulocytes: 0.02 10*3/uL (ref 0.00–0.07)
Basophils Absolute: 0 10*3/uL (ref 0.0–0.1)
Basophils Relative: 0 %
Eosinophils Absolute: 0.3 10*3/uL (ref 0.0–0.5)
Eosinophils Relative: 3 %
HCT: 31.8 % — ABNORMAL LOW (ref 36.0–46.0)
Hemoglobin: 9.8 g/dL — ABNORMAL LOW (ref 12.0–15.0)
Immature Granulocytes: 0 %
Lymphocytes Relative: 34 %
Lymphs Abs: 3 10*3/uL (ref 0.7–4.0)
MCH: 27 pg (ref 26.0–34.0)
MCHC: 30.8 g/dL (ref 30.0–36.0)
MCV: 87.6 fL (ref 80.0–100.0)
Monocytes Absolute: 0.6 10*3/uL (ref 0.1–1.0)
Monocytes Relative: 7 %
Neutro Abs: 5 10*3/uL (ref 1.7–7.7)
Neutrophils Relative %: 56 %
Platelets: 322 10*3/uL (ref 150–400)
RBC: 3.63 MIL/uL — ABNORMAL LOW (ref 3.87–5.11)
RDW: 13.7 % (ref 11.5–15.5)
WBC: 8.9 10*3/uL (ref 4.0–10.5)
nRBC: 0 % (ref 0.0–0.2)

## 2019-07-16 LAB — COMPREHENSIVE METABOLIC PANEL
ALT: 21 U/L (ref 0–44)
AST: 17 U/L (ref 15–41)
Albumin: 3.3 g/dL — ABNORMAL LOW (ref 3.5–5.0)
Alkaline Phosphatase: 85 U/L (ref 38–126)
Anion gap: 9 (ref 5–15)
BUN: 27 mg/dL — ABNORMAL HIGH (ref 6–20)
CO2: 24 mmol/L (ref 22–32)
Calcium: 9.2 mg/dL (ref 8.9–10.3)
Chloride: 108 mmol/L (ref 98–111)
Creatinine, Ser: 0.97 mg/dL (ref 0.44–1.00)
GFR calc Af Amer: >60 mL/min (ref >60)
GFR calc non Af Amer: >60 mL/min (ref >60)
Glucose, Bld: 132 mg/dL — ABNORMAL HIGH (ref 70–99)
Potassium: 4.3 mmol/L (ref 3.5–5.1)
Sodium: 141 mmol/L (ref 135–145)
Total Bilirubin: 0.6 mg/dL (ref 0.3–1.2)
Total Protein: 6.7 g/dL (ref 6.5–8.1)

## 2019-07-16 LAB — C-REACTIVE PROTEIN: CRP: 3 mg/dL — ABNORMAL HIGH (ref <1.0)

## 2019-07-16 LAB — SEDIMENTATION RATE: Sed Rate: 58 mm/hr — ABNORMAL HIGH (ref 0–22)

## 2019-07-16 LAB — D-DIMER, QUANTITATIVE: D-Dimer, Quant: 0.54 ug/mL-FEU — ABNORMAL HIGH (ref 0.00–0.50)

## 2019-07-16 MED ORDER — ACETAMINOPHEN 325 MG PO TABS
650.0000 mg | ORAL_TABLET | Freq: Once | ORAL | Status: AC
  Administered 2019-07-16: 19:00:00 650 mg via ORAL
  Filled 2019-07-16: qty 2

## 2019-07-16 MED ORDER — ACETAMINOPHEN 500 MG PO TABS
1000.0000 mg | ORAL_TABLET | Freq: Once | ORAL | Status: AC
  Administered 2019-07-16: 17:00:00 1000 mg via ORAL
  Filled 2019-07-16: qty 2

## 2019-07-16 NOTE — Discharge Instructions (Addendum)
Acetaminophen: May take acetaminophen (generic for Tylenol), as needed, for pain. Your daily total maximum amount of acetaminophen from all sources should be limited to 4000mg /day for persons without liver problems, or 2000mg /day for those with liver problems.  Follow-up: Follow-up with the orthopedic specialist for any further management and evaluation of this issue.  Call to make an appointment.  Return: Return to the emergency department for worsening pain, loss of sensation, loss of function, or any other major concerns.

## 2019-07-16 NOTE — ED Triage Notes (Signed)
Pt presents with c/o left leg numbness. Pt was seen on 3/20 and diagnosed with cellulitis on her leg. Pt reports that when she stands up, she feels numbness in that leg and pain upon standing. Pt reports pain with ambulation.

## 2019-07-16 NOTE — Progress Notes (Signed)
VASCULAR LAB PRELIMINARY  PRELIMINARY  PRELIMINARY  PRELIMINARY  Left lower extremity venous duplex completed.    Preliminary report:  See CV proc for preliminary results.  Gave report to Dr. Havery Moros, [facility], RVT 07/16/2019, 6:16 PM

## 2019-07-16 NOTE — ED Provider Notes (Signed)
[facility]-EMERGENCY DEPT Provider Note   CSN: [id number] Arrival date & time: 07/16/19  1322     History Chief Complaint  Patient presents with  . Leg numbness    Dawn Braun is a 54 y.o. female.  HPI      Dawn Braun is a 54 y.o. female, with a history of DM, A. fib anticoagulated on Xarelto, HTN, presenting to the ED with left leg pain.    She was seen in the ED with left leg pain on March 20.  At that time, she had pain and erythema to the left anterior thigh.  She had no pain anywhere else on the leg.  The next day, she underwent duplex ultrasound to evaluate for DVT.  The impression was negative for DVT, however, study was limited due to patient's pain and her participation with instruction given by the sonographer. She was prescribed antibiotics.  She finished her antibiotic course and symptoms improved.  About a week ago, she began to have pain to the posterior left knee.  This has since worsened.  Her pain is in the left knee, left calf, and into the left thigh.  Pain is aching, sometimes sharp, severe at rest, but even worse with ambulation and complete extension of the knee.   She also indicates tingling in the left leg intermittently as well as swelling to the left lower leg.  Denies fever/chills, falls/trauma, weakness, actual numbness, groin pain, abdominal pain, back pain, changes in bowel or bladder function, saddle anesthesias, chest pain, shortness of breath, right leg swelling/pain, or any other complaints.  Past Medical History:  Diagnosis Date  . Asthma   . Complication of anesthesia    "takes me awhile to wakeup" (05/17/2017)  . Hypertension   . Intractable nausea and vomiting   . Paroxysmal atrial fibrillation (HCC)   . Type II diabetes mellitus [facility](HCC)     Patient Active Problem List   Diagnosis Date Noted  . CKD (chronic kidney disease) stage 3, GFR 30-59 ml/min 03/28/2019  . Obesity (BMI 30-39.9) 03/28/2019  .  Chronic anticoagulation 03/28/2019  . Closed loop SBO (small bowel obstruction) s/p exlap /lysis of adhesions 03/26/2019 03/25/2019  . Acute renal failure superimposed on stage 3 chronic kidney disease (HCC) 09/19/2018  . DKA (diabetic ketoacidoses) (HCC) 09/18/2018  . Leukocytosis 05/17/2017  . Hypokalemia 01/02/2017  . SIRS (systemic inflammatory response syndrome) (HCC) 12/31/2016  . Hemoperitoneum 12/31/2016  . Intractable nausea and vomiting 12/31/2016  . AKI (acute kidney injury) (HCC) 12/31/2016  . Paroxysmal atrial fibrillation (HCC)   . Asthma   . Diabetes mellitus type 2, uncontrolled, with complications (HCC) 12/23/2016  . Menopausal symptom 05/01/2016    Past Surgical History:  Procedure Laterality Date  . CESAREAN SECTION  2003  . CHOLECYSTECTOMY N/A 12/23/2016   Procedure: LAPAROSCOPIC CHOLECYSTECTOMY;  Surgeon: Sheliah HatchKinsinger, De BlanchLuke Aaron, MD;  Location: [facility];  Service: General;  Laterality: N/A;  . EYE SURGERY Right ~ 2016   "bleeding issues; lasered; cauterized leaks"  . KNEE ARTHROSCOPY Right X 2  . LAPAROTOMY N/A 03/26/2019   Procedure: EXPLORATORY LAPAROTOMY;  Surgeon: Darnell LevelGerkin, Todd, MD;  Location: [facility];  Service: General;  Laterality: N/A;  . LYSIS OF ADHESION N/A 03/26/2019   Procedure: LYSIS OF ADHESION;  Surgeon: Darnell LevelGerkin, Todd, MD;  Location: [facility];  Service: General;  Laterality: N/A;  . SPLIT NIGHT STUDY  08/06/2015  . TUBAL LIGATION  2003     OB History   No obstetric  history on file.     Family History  Problem Relation Age of Onset  . Epilepsy Father   . Diabetes Mother   . Kidney disease Mother   . Hypertension Mother   . Kidney disease Brother   . Diabetic kidney disease Brother   . Diabetes Brother     Social History   Tobacco Use  . Smoking status: Never Smoker  . Smokeless tobacco: Never Used  Substance Use Topics  . Alcohol use: Yes    Alcohol/week: 1.0 standard drinks    Types: 1 Glasses of wine per week    Comment: 05/17/2017  "glass of wine q other weekend"  . Drug use: No    Home Medications Prior to Admission medications   Medication Sig Start Date End Date Taking? Authorizing Provider  atorvastatin (LIPITOR) 20 MG tablet Take 1 tablet (20 mg total) by mouth daily. Patient taking differently: Take 20 mg by mouth every evening.  05/01/19 04/25/20  Jodelle Red, MD  diltiazem (CARDIZEM CD) 120 MG 24 hr capsule Take 1 capsule (120 mg total) by mouth daily. 04/27/19 06/24/19  Fulp, Cammie, MD  doxycycline (VIBRAMYCIN) 100 MG capsule Take 1 capsule (100 mg total) by mouth 2 (two) times daily. 06/24/19   Henderly, Britni A, PA-C  flecainide (TAMBOCOR) 100 MG tablet Take 1 tablet (100 mg total) by mouth 2 (two) times daily. 06/07/19 07/07/19  Newman Nip, NP  HYDROcodone-acetaminophen (NORCO/VICODIN) 5-325 MG tablet Take 1 tablet by mouth every 4 (four) hours as needed. 06/24/19   Henderly, Britni A, PA-C  insulin isophane & regular human (NOVOLIN 70/30 FLEXPEN) (70-30) 100 UNIT/ML KwikPen Inject 9 Units into the skin 2 (two) times daily. 06/21/19 09/12/19  Fulp, Cammie, MD  Insulin Pen Needle 31G X 4 MM MISC 1 Container by Does not apply route 2 (two) times daily. 04/01/19   Briant Cedar, MD  lisinopril (ZESTRIL) 2.5 MG tablet Take 1 tablet (2.5 mg total) by mouth daily. 05/23/19   Fulp, Cammie, MD  metFORMIN (GLUCOPHAGE) 500 MG tablet Take once per day after evening meal Patient taking differently: Take 500 mg by mouth 2 (two) times daily.  04/27/19   Fulp, Cammie, MD  metoprolol tartrate (LOPRESSOR) 25 MG tablet Take 1 tablet (25 mg total) by mouth 2 (two) times daily. 04/01/19 06/24/19  Briant Cedar, MD  rivaroxaban (XARELTO) 20 MG TABS tablet Take 1 tablet (20 mg total) by mouth daily with supper. 04/27/19 06/24/19  Cain Saupe, MD    Allergies    Patient has no known allergies.  Review of Systems   Review of Systems  Constitutional: Negative for chills, diaphoresis and fever.  Respiratory:  Negative for cough and shortness of breath.   Cardiovascular: Positive for leg swelling.  Gastrointestinal: Negative for abdominal pain, nausea and vomiting.  Genitourinary: Negative for difficulty urinating.  Musculoskeletal: Positive for arthralgias. Negative for back pain.  Neurological: Negative for weakness and numbness.  All other systems reviewed and are negative.   Physical Exam Updated Vital Signs BP (!) 168/89 (BP Location: Right Arm)   Pulse 90   Temp 98.2 F (36.8 C) (Oral)   Resp 16   SpO2 99%   Physical Exam Vitals and nursing note reviewed.  Constitutional:      General: She is not in acute distress.    Appearance: She is well-developed. She is not diaphoretic.  HENT:     Head: Normocephalic and atraumatic.     Mouth/Throat:     Mouth:  Mucous membranes are moist.     Pharynx: Oropharynx is clear.  Eyes:     Conjunctiva/sclera: Conjunctivae normal.  Cardiovascular:     Rate and Rhythm: Normal rate and regular rhythm.     Pulses: Normal pulses.          Radial pulses are 2+ on the right side and 2+ on the left side.       Dorsalis pedis pulses are 2+ on the right side and 2+ on the left side.       Posterior tibial pulses are 2+ on the right side and 2+ on the left side.     Heart sounds: Normal heart sounds.     Comments: Tactile temperature in the extremities appropriate and equal bilaterally. Additionally, her pulses were found to be intact whether her knee was extended, flexed, or the patient was standing. Pulmonary:     Effort: Pulmonary effort is normal. No respiratory distress.     Breath sounds: Normal breath sounds.  Abdominal:     Palpations: Abdomen is soft.     Tenderness: There is no abdominal tenderness. There is no guarding.  Musculoskeletal:     Cervical back: Neck supple.     Right lower leg: No edema.     Left lower leg: No edema.     Comments: Patient is quite tender to the posterior left knee without noted swelling, increased warmth,  erythema, deformity, or instability. Tenderness extends into the left calf.  Compartments are soft.  No tenderness, swelling, increased warmth, color change, or other abnormalities to the anterior knee.  No noted effusion. She has some tenderness to the posterior left upper leg.  No tenderness in the back or buttocks.  Lymphadenopathy:     Cervical: No cervical adenopathy.  Skin:    General: Skin is warm and dry.  Neurological:     Mental Status: She is alert.     Comments: Sensation grossly intact to light touch in the lower extremities bilaterally. No saddle anesthesias. Strength 5/5 in the bilateral lower extremities. Coordination intact.  Psychiatric:        Mood and Affect: Mood and affect normal.        Speech: Speech normal.        Behavior: Behavior normal.     ED Results / Procedures / Treatments   Labs (all labs ordered are listed, but only abnormal results are displayed) Labs Reviewed  COMPREHENSIVE METABOLIC PANEL - Abnormal; Notable for the following components:      Result Value   Glucose, Bld 132 (*)    BUN 27 (*)    Albumin 3.3 (*)    All other components within normal limits  CBC WITH DIFFERENTIAL/PLATELET - Abnormal; Notable for the following components:   RBC 3.63 (*)    Hemoglobin 9.8 (*)    HCT 31.8 (*)    All other components within normal limits  D-DIMER, QUANTITATIVE (NOT AT [facility]) - Abnormal; Notable for the following components:   D-Dimer, Quant 0.54 (*)    All other components within normal limits  SEDIMENTATION RATE - Abnormal; Notable for the following components:   Sed Rate 58 (*)    All other components within normal limits  C-REACTIVE PROTEIN - Abnormal; Notable for the following components:   CRP 3.0 (*)    All other components within normal limits   Hemoglobin  Date Value Ref Range Status  07/16/2019 9.8 (L) 12.0 - 15.0 g/dL Final  06/24/2019 10.3 (L) 12.0 - 15.0  g/dL Final  63/78/5885 02.7 (L) 11.1 - 15.9 g/dL Final  74/03/8785 76.7  (L) 12.0 - 15.0 g/dL Final  20/94/7096 9.4 (L) 12.0 - 15.0 g/dL Final    EKG None  Radiology VAS [place] LOWER EXTREMITY VENOUS (DVT) (ONLY [facility] & [facility])  Result Date: 07/16/2019  Lower Venous DVTStudy Indications: Pain, and patient states that when she places her left foot on the floor her leg goes numb from the knee down.  Comparison Study: Prior study done 06/25/19 is available for comparison Performing Technologist: Sherren Kerns RVS  Examination Guidelines: A complete evaluation includes B-mode imaging, spectral Doppler, color Doppler, and power Doppler as needed of all accessible portions of each vessel. Bilateral testing is considered an integral part of a complete examination. Limited examinations for reoccurring indications may be performed as noted. The reflux portion of the exam is performed with the patient in reverse Trendelenburg.  Right Technical Findings: Right leg not evaluated.  +---------+---------------+---------+-----------+----------+--------------+ LEFT     CompressibilityPhasicitySpontaneityPropertiesThrombus Aging +---------+---------------+---------+-----------+----------+--------------+ CFV      Full           Yes      Yes                                 +---------+---------------+---------+-----------+----------+--------------+ SFJ      Full                                                        +---------+---------------+---------+-----------+----------+--------------+ FV Prox  Full                                                        +---------+---------------+---------+-----------+----------+--------------+ FV Mid   Full                                                        +---------+---------------+---------+-----------+----------+--------------+ FV DistalFull                                                        +---------+---------------+---------+-----------+----------+--------------+ PFV      Full                                                         +---------+---------------+---------+-----------+----------+--------------+ POP      Full           Yes      Yes                                 +---------+---------------+---------+-----------+----------+--------------+ PTV      Full                                                        +---------+---------------+---------+-----------+----------+--------------+  PERO     Full                                                        +---------+---------------+---------+-----------+----------+--------------+     Summary: LEFT: - Findings appear essentially unchanged compared to previous examination. - There is no evidence of deep vein thrombosis in the lower extremity.  - No cystic structure found in the popliteal fossa.  *See table(s) above for measurements and observations.    Preliminary     Procedures Procedures (including critical care time)  Medications Ordered in ED Medications  acetaminophen (TYLENOL) tablet 650 mg (has no administration in time range)  acetaminophen (TYLENOL) tablet 1,000 mg (1,000 mg Oral Given 07/16/19 1723)    ED Course  I have reviewed the triage vital signs and the nursing notes.  Pertinent labs & imaging results that were available during my care of the patient were reviewed by me and considered in my medical decision making (see chart for details).  Clinical Course as of Jul 16 1903  Sun Jul 16, 2019  1450 Patient was offered analgesia.  She initially declined.  She eventually agreed to Tylenol at this time.   [SJ]    Clinical Course User Index [SJ] Bertha Earwood, Hillard Danker, PA-C   MDM Rules/Calculators/A&P                      Patient presents with at least a week of left lower extremity pain. She has no evidence of neurovascular compromise on exam. No evidence of DVT or other acute abnormality on ultrasound. There were some abnormalities noted with her blood work, such as her segmentation rate, CRP, and D-dimer, however,  these abnormalities are nonspecific, especially in light of the patient's unremarkable ultrasound.  Patient has been seen previously by Dr. August Saucer, orthopedic surgeon.  I sent Dr. August Saucer message through the epic system inquiring about close follow-up.  The patient was given instructions for home care as well as return precautions. Patient voices understanding of these instructions, accepts the plan, and is comfortable with discharge.  I reviewed and interpreted the patient's labs and radiological studies.   Findings and plan of care discussed with Adalberto Cole, MD. Dr. Jeraldine Loots personally evaluated and examined this patient.  Vitals:   07/16/19 1328 07/16/19 1724  BP: (!) 168/89 (!) 153/85  Pulse: 90 75  Resp: 16 16  Temp: 98.2 F (36.8 C)   TempSrc: Oral   SpO2: 99% 100%     Final Clinical Impression(s) / ED Diagnoses Final diagnoses:  Pain of left lower extremity    Rx / DC Orders ED Discharge Orders    None       Concepcion Living 07/16/19 1908    Gerhard Munch, MD 07/16/19 2205

## 2019-07-27 ENCOUNTER — Ambulatory Visit: Payer: Medicaid Other | Admitting: Family Medicine

## 2019-07-31 ENCOUNTER — Other Ambulatory Visit: Payer: Self-pay

## 2019-07-31 ENCOUNTER — Encounter: Payer: Self-pay | Admitting: Cardiology

## 2019-07-31 ENCOUNTER — Ambulatory Visit (INDEPENDENT_AMBULATORY_CARE_PROVIDER_SITE_OTHER): Payer: Medicaid Other | Admitting: Cardiology

## 2019-07-31 VITALS — BP 130/80 | HR 67 | Temp 96.1°F | Wt 200.8 lb

## 2019-07-31 DIAGNOSIS — I480 Paroxysmal atrial fibrillation: Secondary | ICD-10-CM | POA: Diagnosis not present

## 2019-07-31 DIAGNOSIS — E7800 Pure hypercholesterolemia, unspecified: Secondary | ICD-10-CM | POA: Diagnosis not present

## 2019-07-31 DIAGNOSIS — I10 Essential (primary) hypertension: Secondary | ICD-10-CM | POA: Diagnosis not present

## 2019-07-31 DIAGNOSIS — Z7189 Other specified counseling: Secondary | ICD-10-CM

## 2019-07-31 DIAGNOSIS — M7989 Other specified soft tissue disorders: Secondary | ICD-10-CM | POA: Diagnosis not present

## 2019-07-31 NOTE — Progress Notes (Signed)
Cardiology Office Note:    Date:  07/31/2019   ID:  Sherril Cong, DOB 10/13/1965, MRN [id number]  PCP:  Antony Blackbird, MD  Cardiologist:  Buford Dresser, MD  Referring MD: Antony Blackbird, MD   CC: follow up  History of Present Illness:    Dawn Braun is a 54 y.o. female with a hx of recent admission for SBO s/p surgery, uncontrolled type 2 diabetes (A1c 13.6) with recent DKA, paroxysmal atrial fibrillation followed by afib clinic, asthma, stage 3 CKD with recent AKI, obesity who is seen for follow up today. I initiallsy saw her 05/01/19 as a new consult at the request of Fulp, Cammie, MD for the evaluation and management of paroxysmal atrial fibrillation.  Recent hospitalization and note from Dr. Chapman Fitch (04/27/19) reviewed. She was last seen by Roderic Palau in the [place] clinic 07/27/2017 for post ER follow up. At that time she was on flecainide, metoprolol, diltiazem and apixaban.  Today: Had cellulitis about a month ago, was treated for cellulitis of upper left leg/thigh for two weeks and this improved. Returned 07/06/19 with left leg pain and itching in her left lower leg. Had vascular ultrasound, no DVT seen. Noted swelling from knee to ankle on the left side. Was having numbness of the lower leg with standing. Swelling also worsened with walking around a college tour with her son recently.   No missed doses of rivaroxaban. I reviewed vascular ultrasounds from 06/25/19 and 07/18/19. No DVT, no swelling/cyst in popliteal fossa. CRP, ESR slightly elevated. D-dimer slightly elevated as well. White count normal.  Not improved with elevation. No clear alleviating factors. Worse when she first puts pressure on the leg.   Has not had any recent afib. Denies chest pain, shortness of breath at rest or with normal exertion. No PND, orthopnea, or unexpected weight gain. No syncope or palpitations.  ROS positive for intermittent hives, no clear triggers, never in the same place. Moves all  over her body.   Past Medical History:  Diagnosis Date  . Asthma   . Complication of anesthesia    "takes me awhile to wakeup" (05/17/2017)  . Hypertension   . Intractable nausea and vomiting   . Paroxysmal atrial fibrillation (HCC)   . Type II diabetes mellitus ([place])     Past Surgical History:  Procedure Laterality Date  . CESAREAN SECTION  2003  . CHOLECYSTECTOMY N/A 12/23/2016   Procedure: LAPAROSCOPIC CHOLECYSTECTOMY;  Surgeon: Kieth Brightly Arta Bruce, MD;  Location: [facility];  Service: General;  Laterality: N/A;  . EYE SURGERY Right ~ 2016   "bleeding issues; lasered; cauterized leaks"  . KNEE ARTHROSCOPY Right X 2  . LAPAROTOMY N/A 03/26/2019   Procedure: EXPLORATORY LAPAROTOMY;  Surgeon: Armandina Gemma, MD;  Location: [facility];  Service: General;  Laterality: N/A;  . LYSIS OF ADHESION N/A 03/26/2019   Procedure: LYSIS OF ADHESION;  Surgeon: Armandina Gemma, MD;  Location: [facility];  Service: General;  Laterality: N/A;  . SPLIT NIGHT STUDY  08/06/2015  . TUBAL LIGATION  2003    Current Medications: Current Outpatient Medications on File Prior to Visit  Medication Sig  . atorvastatin (LIPITOR) 20 MG tablet Take 1 tablet (20 mg total) by mouth daily. (Patient taking differently: Take 20 mg by mouth every evening. )  . doxycycline (VIBRAMYCIN) 100 MG capsule Take 1 capsule (100 mg total) by mouth 2 (two) times daily.  [name] HYDROcodone-acetaminophen (NORCO/VICODIN) 5-325 MG tablet Take 1 tablet by mouth every 4 (four) hours as needed.  [name]  insulin isophane & regular human (NOVOLIN 70/30 FLEXPEN) (70-30) 100 UNIT/ML KwikPen Inject 9 Units into the skin 2 (two) times daily.  . Insulin Pen Needle 31G X 4 MM MISC 1 Container by Does not apply route 2 (two) times daily.  [name] lisinopril (ZESTRIL) 2.5 MG tablet Take 1 tablet (2.5 mg total) by mouth daily.  . metFORMIN (GLUCOPHAGE) 500 MG tablet Take once per day after evening meal (Patient taking differently: Take 500 mg by mouth 2 (two) times daily. )  .  diltiazem (CARDIZEM CD) 120 MG 24 hr capsule Take 1 capsule (120 mg total) by mouth daily.  . flecainide (TAMBOCOR) 100 MG tablet Take 1 tablet (100 mg total) by mouth 2 (two) times daily.  . metoprolol tartrate (LOPRESSOR) 25 MG tablet Take 1 tablet (25 mg total) by mouth 2 (two) times daily.  . rivaroxaban (XARELTO) 20 MG TABS tablet Take 1 tablet (20 mg total) by mouth daily with supper.   No current facility-administered medications on file prior to visit.     Allergies:   Patient has no known allergies.   Social History   Tobacco Use  . Smoking status: Never Smoker  . Smokeless tobacco: Never Used  Substance Use Topics  . Alcohol use: Yes    Alcohol/week: 1.0 standard drinks    Types: 1 Glasses of wine per week    Comment: 05/17/2017 "glass of wine q other weekend"  . Drug use: No    Family History: family history includes Diabetes in her brother and mother; Diabetic kidney disease in her brother; Epilepsy in her father; Hypertension in her mother; Kidney disease in her brother and mother.  ROS:   Please see the history of present illness.  Additional pertinent ROS otherwise unremarkable.  EKGs/Labs/Other Studies Reviewed:    The following studies were reviewed today: Echo 12/2017, ETT 01/31/16, echo 07/2015  EKG:  EKG is personally reviewed.  The ekg ordered today demonstrates NSR, LPFB  Recent Labs: 07/16/2019: ALT 21; BUN 27; Creatinine, Ser 0.97; Hemoglobin 9.8; Platelets 322; Potassium 4.3; Sodium 141  Recent Lipid Panel    Component Value Date/Time   CHOL 183 04/27/2019 1039   TRIG 77 04/27/2019 1039   HDL 52 04/27/2019 1039   CHOLHDL 3.5 04/27/2019 1039   LDLCALC 117 (H) 04/27/2019 1039    Physical Exam:    VS:  BP 130/80   Pulse 67   Temp (!) 96.1 F (35.6 C)   Wt 200 lb 12.8 oz (91.1 kg)   SpO2 98%   BMI 36.73 kg/m     Wt Readings from Last 3 Encounters:  07/31/19 200 lb 12.8 oz (91.1 kg)  06/07/19 204 lb 12.8 oz (92.9 kg)  05/16/19 204 lb (92.5  kg)    GEN: Well nourished, well developed in no acute distress HEENT: Normal, moist mucous membranes NECK: No JVD CARDIAC: regular rhythm, normal S1 and S2, no rubs or gallops. No murmur. VASCULAR: Radial and DP pulses 2+ bilaterally. No carotid bruits RESPIRATORY:  Clear to auscultation without rales, wheezing or rhonchi  ABDOMEN: Soft, non-tender, non-distended MUSCULOSKELETAL:  Ambulates independently SKIN: Warm and dry. Left lower extremity visually appears swollen compared to right. No pitting edema. No erythema or warmth. Palpable pulses. Very tender to touch diffusely across area. NEUROLOGIC:  Alert and oriented x 3. No focal neuro deficits noted. PSYCHIATRIC:  Normal affect   ASSESSMENT:    1. Paroxysmal atrial fibrillation (HCC)   2. Essential hypertension   3. Left leg swelling  4. Hypercholesterolemia   5. Cardiac risk counseling   6. Counseling on health promotion and disease prevention    PLAN:    Paroxysmal atrial fibrillation: -CHA2DS2/VAS Stroke Risk Points= 3  -on diltiazem, flecainide, metoprolol -on rivaroxaban for long term anticoagulation -reviewed risk of arrhythmia on flecainide with ischemia  Left lower extremity swelling: negative for DVT on study, and she has not missed any anticoagulation. No palpable cords, no erythema, palpable pulses. Very tender to touch. Concerning given slightly abnormal labs. -with palpable pulses and recent negative venous dopplers, unlikely acute vascular issue -she has upcoming appt this week with Dr. Chapman Fitch. Will defer to her expertise, but question if she needs CT or MRI given symptoms and findings.  Hypertension: Goal <130/80 -just at goal today -on metoprolol, diltiazem, lisinopril -instructed on home BP measurements  Hypercholesterolemia:  -tolerating atorvastatin 20 mg daily -recent LFTs WNL -recheck lipids at follow up  Cardiac risk counseling and prevention recommendations: -recommend heart  healthy/Mediterranean diet, with whole grains, fruits, vegetable, fish, lean meats, nuts, and olive oil. Limit salt. -recommend moderate walking, 3-5 times/week for 30-50 minutes each session. Aim for at least 150 minutes.week. Goal should be pace of 3 miles/hours, or walking 1.5 miles in 30 minutes -recommend avoidance of tobacco products. Avoid excess alcohol. -Additional risk factor control:  -Diabetes risk: A1c last was 13.6. Per PCP  -Weight: BMI 36 with risk factors, would benefit from weight loss -ASCVD risk score: The 10-year ASCVD risk score Mikey Bussing DC Brooke Bonito., et al., 2013) is: 11%   Values used to calculate the score:     Age: 59 years     Sex: Female     Is Non-Hispanic African American: Yes     Diabetic: Yes     Tobacco smoker: No     Systolic Blood Pressure: 638 mmHg     Is BP treated: Yes     HDL Cholesterol: 52 mg/dL     Total Cholesterol: 183 mg/dL    Plan for follow up: 6 mos or sooner PRN  Buford Dresser, MD, PhD [place]  [facility]    Medication Adjustments/Labs and Tests Ordered: Current medicines are reviewed at length with the patient today.  Concerns regarding medicines are outlined above.  Orders Placed This Encounter  Procedures  . EKG 12-Lead   No orders of the defined types were placed in this encounter.   Patient Instructions  Medication Instructions:  Your Physician recommend you continue on your current medication as directed.    *If you need a refill on your cardiac medications before your next appointment, please call your pharmacy*   Lab Work: None   Testing/Procedures: NOne   Follow-Up: At [facility], you and your health needs are our priority.  As part of our continuing mission to provide you with exceptional heart care, we have created designated Provider Care Teams.  These Care Teams include your primary Cardiologist (physician) and Advanced Practice Providers (APPs -  Physician Assistants and Nurse Practitioners)  who all work together to provide you with the care you need, when you need it.  We recommend signing up for the patient portal called "MyChart".  Sign up information is provided on this After Visit Summary.  MyChart is used to connect with patients for Virtual Visits (Telemedicine).  Patients are able to view lab/test results, encounter notes, upcoming appointments, etc.  Non-urgent messages can be sent to your provider as well.   To learn more about what you can do with MyChart, go  to [email or web address].    Your next appointment:   6 month(s)  The format for your next appointment:   In Person  Provider:   Buford Dresser, MD       Signed, Buford Dresser, MD PhD 07/31/2019  [place]

## 2019-07-31 NOTE — Patient Instructions (Signed)
Medication Instructions:  Your Physician recommend you continue on your current medication as directed.    *If you need a refill on your cardiac medications before your next appointment, please call your pharmacy*   Lab Work: None   Testing/Procedures: NOne   Follow-Up: At [facility], you and your health needs are our priority.  As part of our continuing mission to provide you with exceptional heart care, we have created designated Provider Care Teams.  These Care Teams include your primary Cardiologist (physician) and Advanced Practice Providers (APPs -  Physician Assistants and Nurse Practitioners) who all work together to provide you with the care you need, when you need it.  We recommend signing up for the patient portal called "MyChart".  Sign up information is provided on this After Visit Summary.  MyChart is used to connect with patients for Virtual Visits (Telemedicine).  Patients are able to view lab/test results, encounter notes, upcoming appointments, etc.  Non-urgent messages can be sent to your provider as well.   To learn more about what you can do with MyChart, go to [email or web address].    Your next appointment:   6 month(s)  The format for your next appointment:   In Person  Provider:   Jodelle Red, MD

## 2019-08-03 ENCOUNTER — Ambulatory Visit: Payer: Medicaid Other | Attending: Family Medicine | Admitting: Physician Assistant

## 2019-08-03 ENCOUNTER — Telehealth: Payer: Self-pay | Admitting: Family Medicine

## 2019-08-03 ENCOUNTER — Other Ambulatory Visit: Payer: Self-pay

## 2019-08-03 VITALS — BP 146/81 | HR 72 | Temp 97.7°F | Ht 64.17 in | Wt 202.0 lb

## 2019-08-03 DIAGNOSIS — N1831 Chronic kidney disease, stage 3a: Secondary | ICD-10-CM | POA: Insufficient documentation

## 2019-08-03 DIAGNOSIS — L503 Dermatographic urticaria: Secondary | ICD-10-CM | POA: Diagnosis not present

## 2019-08-03 DIAGNOSIS — E1165 Type 2 diabetes mellitus with hyperglycemia: Secondary | ICD-10-CM | POA: Diagnosis not present

## 2019-08-03 DIAGNOSIS — L509 Urticaria, unspecified: Secondary | ICD-10-CM | POA: Insufficient documentation

## 2019-08-03 DIAGNOSIS — I480 Paroxysmal atrial fibrillation: Secondary | ICD-10-CM | POA: Insufficient documentation

## 2019-08-03 DIAGNOSIS — M7989 Other specified soft tissue disorders: Secondary | ICD-10-CM | POA: Insufficient documentation

## 2019-08-03 DIAGNOSIS — Z794 Long term (current) use of insulin: Secondary | ICD-10-CM | POA: Diagnosis not present

## 2019-08-03 DIAGNOSIS — Z79899 Other long term (current) drug therapy: Secondary | ICD-10-CM | POA: Insufficient documentation

## 2019-08-03 DIAGNOSIS — Z6834 Body mass index (BMI) 34.0-34.9, adult: Secondary | ICD-10-CM | POA: Diagnosis not present

## 2019-08-03 DIAGNOSIS — E669 Obesity, unspecified: Secondary | ICD-10-CM | POA: Insufficient documentation

## 2019-08-03 DIAGNOSIS — E118 Type 2 diabetes mellitus with unspecified complications: Secondary | ICD-10-CM | POA: Insufficient documentation

## 2019-08-03 DIAGNOSIS — D649 Anemia, unspecified: Secondary | ICD-10-CM | POA: Insufficient documentation

## 2019-08-03 DIAGNOSIS — Z7901 Long term (current) use of anticoagulants: Secondary | ICD-10-CM | POA: Insufficient documentation

## 2019-08-03 DIAGNOSIS — E1122 Type 2 diabetes mellitus with diabetic chronic kidney disease: Secondary | ICD-10-CM | POA: Insufficient documentation

## 2019-08-03 DIAGNOSIS — E7800 Pure hypercholesterolemia, unspecified: Secondary | ICD-10-CM | POA: Insufficient documentation

## 2019-08-03 DIAGNOSIS — Z09 Encounter for follow-up examination after completed treatment for conditions other than malignant neoplasm: Secondary | ICD-10-CM

## 2019-08-03 DIAGNOSIS — I129 Hypertensive chronic kidney disease with stage 1 through stage 4 chronic kidney disease, or unspecified chronic kidney disease: Secondary | ICD-10-CM | POA: Insufficient documentation

## 2019-08-03 DIAGNOSIS — IMO0002 Reserved for concepts with insufficient information to code with codable children: Secondary | ICD-10-CM

## 2019-08-03 LAB — POCT GLYCOSYLATED HEMOGLOBIN (HGB A1C): Hemoglobin A1C: 6.7 % — AB (ref 4.0–5.6)

## 2019-08-03 LAB — GLUCOSE, POCT (MANUAL RESULT ENTRY): POC Glucose: 121 mg/dl — AB (ref 70–99)

## 2019-08-03 MED ORDER — INSULIN PEN NEEDLE 31G X 4 MM MISC: 1.0000 | Freq: Two times a day (BID) | 0 refills | Status: DC

## 2019-08-03 MED ORDER — CETIRIZINE HCL 10 MG PO TABS: 10.0000 mg | ORAL_TABLET | Freq: Every day | ORAL | 11 refills | Status: DC

## 2019-08-03 MED ORDER — METFORMIN HCL 500 MG PO TABS: ORAL_TABLET | ORAL | 3 refills | Status: DC

## 2019-08-03 MED ORDER — NOVOLIN 70/30 FLEXPEN (70-30) 100 UNIT/ML ~~LOC~~ SUPN: 9.0000 [IU] | PEN_INJECTOR | Freq: Two times a day (BID) | SUBCUTANEOUS | 3 refills | Status: DC

## 2019-08-03 MED ORDER — DOXYCYCLINE HYCLATE 100 MG PO CAPS: 100.0000 mg | ORAL_CAPSULE | Freq: Two times a day (BID) | ORAL | 0 refills | Status: DC

## 2019-08-03 NOTE — Progress Notes (Signed)
Patient ID: Dawn Braun, female   DOB: [birth year], 54 y.o.   MRN: [id number]     Dawn Braun, is a 54 y.o. female  HWK:[id number]  [id number]  DOB - 1965-10-27  Subjective:  Chief Complaint and HPI: Dawn Braun is a 54 y.o. female here today for a follow up visit after being seen a few times in the ED for L leg swelling.  DVT studies were negative.  No missed doses of xarelto.  She was placed on Doxy and started improving but since stopping the Doxy her lower L leg and L ankle have continued to swell.  Saw cardiology 07/31/2019.  No fever.  Vascular US done 07/18/2019 and negative for DVT.  Swelling has improved a little since then but started worsening again when she completed the antibiotics.  No CP, no SOB.  Tylenol has helped for leg pain but she hasn't taken any in 1 week.    She has been having intermittent rashes(this started prior to taking doxy).  This occurs all over her body with no causative agents identified.  She has multiple photos of when it occurs.  She is not having the rash currently.  No SOB.  No change in meds or soaps.    ED note A/P 07/16/2019: She has no evidence of neurovascular compromise on exam. No evidence of DVT or other acute abnormality on ultrasound. There were some abnormalities noted with her blood work, such as her segmentation rate, CRP, and D-dimer, however, these abnormalities are nonspecific, especially in light of the patient's unremarkable ultrasound.  Patient has been seen previously by Dr. Marlou Sa, orthopedic surgeon.  I sent Dr. Marlou Sa message through the epic system inquiring about close follow-up.  The patient was given instructions for home care as well as return precautions. Patient voices understanding of these instructions, accepts the plan, and is comfortable with discharge.  cardiology HPI 07/31/2019: Dawn Braun is a 54 y.o. female with a hx of recent admission for SBO s/p surgery, uncontrolled type 2 diabetes (A1c  13.6) with recent DKA, paroxysmal atrial fibrillation followed by afib clinic, asthma, stage 3 CKD with recent AKI, obesity who is seen for follow up today. I initiallsy saw her 05/01/19 as a new consult at the request of Fulp, Cammie, MD for the evaluation and management of paroxysmal atrial fibrillation.  Recent hospitalization and note from Dr. Chapman Fitch (04/27/19) reviewed. She was last seen by Roderic Palau in the [place] clinic 07/27/2017 for post ER follow up. At that time she was on flecainide, metoprolol, diltiazem and apixaban.  cardiology: Had cellulitis about a month ago, was treated for cellulitis of upper left leg/thigh for two weeks and this improved. Returned 07/06/19 with left leg pain and itching in her left lower leg. Had vascular ultrasound, no DVT seen. Noted swelling from knee to ankle on the left side. Was having numbness of the lower leg with standing. Swelling also worsened with walking around a college tour with her son recently.   No missed doses of rivaroxaban. I reviewed vascular ultrasounds from 06/25/19 and 07/18/19. No DVT, no swelling/cyst in popliteal fossa. CRP, ESR slightly elevated. D-dimer slightly elevated as well. White count normal.  Not improved with elevation. No clear alleviating factors. Worse when she first puts pressure on the leg.   Has not had any recent afib. Denies chest pain, shortness of breath at rest or with normal exertion. No PND, orthopnea, or unexpected weight gain. No syncope or palpitations.  From A/P: Paroxysmal atrial fibrillation: -CHA2DS2/VAS  Stroke Risk Points= 3  -on diltiazem, flecainide, metoprolol -on rivaroxaban for long term anticoagulation -reviewed risk of arrhythmia on flecainide with ischemia  Left lower extremity swelling: negative for DVT on study, and she has not missed any anticoagulation. No palpable cords, no erythema, palpable pulses. Very tender to touch. Concerning given slightly abnormal labs. -with palpable pulses and  recent negative venous dopplers, unlikely acute vascular issue -she has upcoming appt this week with Dr. Chapman Fitch. Will defer to her expertise, but question if she needs CT or MRI given symptoms and findings.  Hypertension: Goal <130/80 -just at goal today -on metoprolol, diltiazem, lisinopril -instructed on home BP measurements  Hypercholesterolemia:  -tolerating atorvastatin 20 mg daily -recent LFTs WNL -recheck lipids at follow up  Cardiac risk counseling and prevention recommendations: -recommend heart healthy/Mediterranean diet, with whole grains, fruits, vegetable, fish, lean meats, nuts, and olive oil. Limit salt. -recommend moderate walking, 3-5 times/week for 30-50 minutes each session. Aim for at least 150 minutes.week. Goal should be pace of 3 miles/hours, or walking 1.5 miles in 30 minutes -recommend avoidance of tobacco products. Avoid excess alcohol. -Additional risk factor control:             -Diabetes risk: A1c last was 13.6. Per PCP             -Weight: BMI 36 with risk factors, would benefit from weight loss -ASCVD risk score: The 10-year ASCVD risk score Mikey Bussing DC Brooke Bonito., et al., 2013) is: 11%   Values used to calculate the score:     Age: 49 years     Sex: Female     Is Non-Hispanic African American: Yes     Diabetic: Yes     Tobacco smoker: No     Systolic Blood Pressure: 378 mmHg     Is BP treated: Yes     HDL Cholesterol: 52 mg/dL     Total Cholesterol: 183 mg/dL   ROS positive for intermittent hives, no clear triggers, never in the same place. Moves all over her body.    ED/Hospital notes reviewed.    ROS:   Constitutional:  No f/c, No night sweats, No unexplained weight loss. EENT:  No vision changes, No blurry vision, No hearing changes. No mouth, throat, or ear problems.  Respiratory: No cough, No SOB Cardiac: No CP, no palpitations GI:  No abd pain, No N/V/D. GU: No Urinary s/sx Musculoskeletal: L leg pain Neuro: No headache, no dizziness, no motor  weakness.  Skin: spontaneous waxing and waning hives-none present currently Endocrine:  No polydipsia. No polyuria.  Psych: Denies SI/HI  No problems updated.  ALLERGIES: No Known Allergies  PAST MEDICAL HISTORY: Past Medical History:  Diagnosis Date  . Asthma   . Complication of anesthesia    "takes me awhile to wakeup" (05/17/2017)  . Hypertension   . Intractable nausea and vomiting   . Paroxysmal atrial fibrillation (HCC)   . Type II diabetes mellitus ([place])     MEDICATIONS AT HOME: Prior to Admission medications   Medication Sig Start Date End Date Taking? Authorizing Provider  atorvastatin (LIPITOR) 20 MG tablet Take 1 tablet (20 mg total) by mouth daily. 05/01/19 04/25/20 Yes Buford Dresser, MD  doxycycline (VIBRAMYCIN) 100 MG capsule Take 1 capsule (100 mg total) by mouth 2 (two) times daily. 08/03/19  Yes Kamilia Carollo, Dionne Bucy, PA-C  insulin isophane & regular human (NOVOLIN 70/30 FLEXPEN) (70-30) 100 UNIT/ML KwikPen Inject 9 Units into the skin 2 (two) times daily. 08/03/19 10/25/19 Yes  Freeman Caldron M, PA-C  Insulin Pen Needle 31G X 4 MM MISC 1 Container by Does not apply route 2 (two) times daily. 08/03/19  Yes Freeman Caldron M, PA-C  lisinopril (ZESTRIL) 2.5 MG tablet Take 1 tablet (2.5 mg total) by mouth daily. 05/23/19  Yes Fulp, Cammie, MD  metFORMIN (GLUCOPHAGE) 500 MG tablet Take 2 in  the morning with food and 1 in the evening with food 08/03/19  Yes Ladislaus Repsher M, PA-C  cetirizine (ZYRTEC) 10 MG tablet Take 1 tablet (10 mg total) by mouth daily. 08/03/19   Argentina Donovan, PA-C  diltiazem (CARDIZEM CD) 120 MG 24 hr capsule Take 1 capsule (120 mg total) by mouth daily. 04/27/19 06/24/19  Fulp, Cammie, MD  flecainide (TAMBOCOR) 100 MG tablet Take 1 tablet (100 mg total) by mouth 2 (two) times daily. 06/07/19 07/07/19  Sherran Needs, NP  HYDROcodone-acetaminophen (NORCO/VICODIN) 5-325 MG tablet Take 1 tablet by mouth every 4 (four) hours as needed. Patient not  taking: Reported on 08/03/2019 06/24/19   Henderly, Britni A, PA-C  metoprolol tartrate (LOPRESSOR) 25 MG tablet Take 1 tablet (25 mg total) by mouth 2 (two) times daily. 04/01/19 06/24/19  Alma Friendly, MD  rivaroxaban (XARELTO) 20 MG TABS tablet Take 1 tablet (20 mg total) by mouth daily with supper. 04/27/19 06/24/19  Fulp, Cammie, MD     Objective:  EXAM:   Vitals:   08/03/19 0849  BP: (!) 146/81  Pulse: 72  Temp: 97.7 F (36.5 C)  TempSrc: Temporal  SpO2: 98%  Weight: 202 lb (91.6 kg)  Height: 5' 4.17" (1.63 m)    General appearance : A&OX3. NAD. Non-toxic-appearing HEENT: Atraumatic and Normocephalic.  PERRLA. EOM intact.  Chest/Lungs:  Breathing-non-labored, Good air entry bilaterally, breath sounds normal without rales, rhonchi, or wheezing  CVS: S1 S2 regular, no murmurs, gallops, rubs  Abdomen: Bowel sounds present, Non tender and not distended with no gaurding, rigidity or rebound. Extremities: R Lower Ext shows no edema, both legs are warm to touch with = pulse throughout.  L leg with 1-2 + edema and mild erythema with skin tautness.  +TTP at lateral malleolus and decreased ROM.   Neurology:  CN II-XII grossly intact, Non focal.   Psych:  TP linear. J/I WNL. Normal speech. Appropriate eye contact and affect.  Skin:  No Rash currently-photos in her phone appear like urticaria and some dermatographia  Data Review Lab Results  Component Value Date   HGBA1C 6.7 (A) 08/03/2019   HGBA1C 13.6 (H) 03/26/2019   HGBA1C 18.3 (H) 09/18/2018     Assessment & Plan   1. Diabetes mellitus type 2, uncontrolled, with complications ([place]) Improving but will increase metformin from 1066m daily to 1000 in am and 5032min pm - Glucose (CBG) - HgB A1c - Comprehensive metabolic panel - Lipid panel - insulin isophane & regular human (NOVOLIN 70/30 FLEXPEN) (70-30) 100 UNIT/ML KwikPen; Inject 9 Units into the skin 2 (two) times daily.  Dispense: 15 mL; Refill: 3 - Insulin Pen  Needle 31G X 4 MM MISC; 1 Container by Does not apply route 2 (two) times daily.  Dispense: 100 each; Refill: 0 - metFORMIN (GLUCOPHAGE) 500 MG tablet; Take 2 in  the morning with food and 1 in the evening with food  Dispense: 90 tablet; Refill: 3  2. Left leg swelling Uncertain etiology.  Will proceed with next imaging as recommended.  Since she was improving on antibiotics, I will restart the doxy.  Elevate leg.  To ED if worsens.  Continue xarelto - Comprehensive metabolic panel - CBC with Differential/Platelet - doxycycline (VIBRAMYCIN) 100 MG capsule; Take 1 capsule (100 mg total) by mouth 2 (two) times daily.  Dispense: 20 capsule; Refill: 0 - Uric Acid - CT EXTREMITY LOWER LEFT W WO CONTRAST; Future  3. Dermatographia - cetirizine (ZYRTEC) 10 MG tablet; Take 1 tablet (10 mg total) by mouth daily.  Dispense: 30 tablet; Refill: 11 - Ambulatory referral to Dermatology  4. Urticaria -unclear etiology but occurring intermittently since February.   - cetirizine (ZYRTEC) 10 MG tablet; Take 1 tablet (10 mg total) by mouth daily.  Dispense: 30 tablet; Refill: 11 - Ambulatory referral to Dermatology  5. Paroxysmal atrial fibrillation (HCC) Continue current regimen  6. Long-term use of high-risk medication cmp today  7. Stage 3a chronic kidney disease cmp today  8. Normocytic anemia cbc  9. Hospital discharge follow-up   Patient have been counseled extensively about nutrition and exercise  Return in about 4 weeks (around 08/31/2019) for needs to see her PCP.  The patient was given clear instructions to go to ER or return to medical center if symptoms don't improve, worsen or new problems develop. The patient verbalized understanding. The patient was told to call to get lab results if they haven't heard anything in the next week.     Freeman Caldron, PA-C [facility] [place], [employer]   08/03/2019, 10:56 AM

## 2019-08-03 NOTE — Telephone Encounter (Signed)
Rep with the ct center at [facility] long called to inform pcp that the order needs to be corrected. She stated that the CT for the lower L Leg should have a specific body part on the order. Please follow up at your earliest convenience.

## 2019-08-03 NOTE — Telephone Encounter (Signed)
I did not order the CT on this patient

## 2019-08-04 LAB — CBC WITH DIFFERENTIAL/PLATELET
Basophils Absolute: 0 10*3/uL (ref 0.0–0.2)
Basos: 0 %
EOS (ABSOLUTE): 0.4 10*3/uL (ref 0.0–0.4)
Eos: 4 %
Hematocrit: 33.9 % — ABNORMAL LOW (ref 34.0–46.6)
Hemoglobin: 11 g/dL — ABNORMAL LOW (ref 11.1–15.9)
Immature Grans (Abs): 0 10*3/uL (ref 0.0–0.1)
Immature Granulocytes: 0 %
Lymphocytes Absolute: 3.2 10*3/uL — ABNORMAL HIGH (ref 0.7–3.1)
Lymphs: 37 %
MCH: 26.9 pg (ref 26.6–33.0)
MCHC: 32.4 g/dL (ref 31.5–35.7)
MCV: 83 fL (ref 79–97)
Monocytes Absolute: 0.4 10*3/uL (ref 0.1–0.9)
Monocytes: 5 %
Neutrophils Absolute: 4.6 10*3/uL (ref 1.4–7.0)
Neutrophils: 54 %
Platelets: 406 10*3/uL (ref 150–450)
RBC: 4.09 x10E6/uL (ref 3.77–5.28)
RDW: 13.7 % (ref 11.7–15.4)
WBC: 8.6 10*3/uL (ref 3.4–10.8)

## 2019-08-04 LAB — COMPREHENSIVE METABOLIC PANEL
ALT: 20 IU/L (ref 0–32)
AST: 29 IU/L (ref 0–40)
Albumin/Globulin Ratio: 1.3 (ref 1.2–2.2)
Albumin: 4 g/dL (ref 3.8–4.9)
Alkaline Phosphatase: 131 IU/L — ABNORMAL HIGH (ref 39–117)
BUN/Creatinine Ratio: 26 — ABNORMAL HIGH (ref 9–23)
BUN: 29 mg/dL — ABNORMAL HIGH (ref 6–24)
Bilirubin Total: <0.2 mg/dL (ref 0.0–1.2)
CO2: 18 mmol/L — ABNORMAL LOW (ref 20–29)
Calcium: 9.5 mg/dL (ref 8.7–10.2)
Chloride: 103 mmol/L (ref 96–106)
Creatinine, Ser: 1.12 mg/dL — ABNORMAL HIGH (ref 0.57–1.00)
GFR calc Af Amer: 64 mL/min/{1.73_m2} (ref >59)
GFR calc non Af Amer: 56 mL/min/{1.73_m2} — ABNORMAL LOW (ref >59)
Globulin, Total: 3.1 g/dL (ref 1.5–4.5)
Glucose: 115 mg/dL — ABNORMAL HIGH (ref 65–99)
Potassium: 5.7 mmol/L — ABNORMAL HIGH (ref 3.5–5.2)
Sodium: 140 mmol/L (ref 134–144)
Total Protein: 7.1 g/dL (ref 6.0–8.5)

## 2019-08-04 LAB — LIPID PANEL
Chol/HDL Ratio: 2.3 ratio (ref 0.0–4.4)
Cholesterol, Total: 133 mg/dL (ref 100–199)
HDL: 59 mg/dL (ref >39)
LDL Chol Calc (NIH): 62 mg/dL (ref 0–99)
Triglycerides: 54 mg/dL (ref 0–149)
VLDL Cholesterol Cal: 12 mg/dL (ref 5–40)

## 2019-08-04 LAB — URIC ACID: Uric Acid: 7.6 mg/dL — ABNORMAL HIGH (ref 3.0–7.2)

## 2019-08-04 NOTE — Telephone Encounter (Signed)
Please call and let the CT dept know that her L leg CT needs to be from L mid thigh down to the foot.  If you can order it for me and I can sign it, that would be great.  She has already had U/S to R/O DVT.  We are looking to try and find out why she is having swelling in her L leg/wondering if there is a possible cellulitis or mass that is causing her leg to be swollen.  If radiology says a different test is more appropriate, I could order that instead.  Thanks, Georgian Co, PA-C

## 2019-08-07 ENCOUNTER — Other Ambulatory Visit: Payer: Self-pay | Admitting: Physician Assistant

## 2019-08-07 NOTE — Addendum Note (Signed)
Addended by: Guillermina City A on: 08/07/2019 05:31 PM   Modules accepted: Orders

## 2019-08-09 ENCOUNTER — Telehealth: Payer: Self-pay | Admitting: Physician Assistant

## 2019-08-10 ENCOUNTER — Ambulatory Visit (HOSPITAL_COMMUNITY): Admission: RE | Admit: 2019-08-10 | Payer: Medicaid Other | Source: Ambulatory Visit

## 2019-08-11 ENCOUNTER — Ambulatory Visit (HOSPITAL_COMMUNITY): Payer: Medicaid Other

## 2019-10-24 ENCOUNTER — Other Ambulatory Visit: Payer: Self-pay | Admitting: Family Medicine

## 2019-10-25 ENCOUNTER — Other Ambulatory Visit: Payer: Self-pay | Admitting: Family Medicine

## 2019-12-13 ENCOUNTER — Ambulatory Visit: Payer: Medicaid Other | Attending: Family | Admitting: Family Medicine

## 2019-12-13 ENCOUNTER — Encounter: Payer: Self-pay | Admitting: Family Medicine

## 2019-12-13 ENCOUNTER — Other Ambulatory Visit: Payer: Self-pay

## 2019-12-13 VITALS — BP 137/83 | HR 68 | Temp 98.1°F | Resp 16 | Ht 62.0 in | Wt 204.0 lb

## 2019-12-13 DIAGNOSIS — E1121 Type 2 diabetes mellitus with diabetic nephropathy: Secondary | ICD-10-CM

## 2019-12-13 DIAGNOSIS — Z124 Encounter for screening for malignant neoplasm of cervix: Secondary | ICD-10-CM

## 2019-12-13 DIAGNOSIS — R5383 Other fatigue: Secondary | ICD-10-CM

## 2019-12-13 DIAGNOSIS — Z1211 Encounter for screening for malignant neoplasm of colon: Secondary | ICD-10-CM

## 2019-12-13 DIAGNOSIS — N1831 Chronic kidney disease, stage 3a: Secondary | ICD-10-CM

## 2019-12-13 DIAGNOSIS — E1122 Type 2 diabetes mellitus with diabetic chronic kidney disease: Secondary | ICD-10-CM

## 2019-12-13 DIAGNOSIS — Z794 Long term (current) use of insulin: Secondary | ICD-10-CM

## 2019-12-13 DIAGNOSIS — Z7901 Long term (current) use of anticoagulants: Secondary | ICD-10-CM

## 2019-12-13 DIAGNOSIS — Z1231 Encounter for screening mammogram for malignant neoplasm of breast: Secondary | ICD-10-CM

## 2019-12-13 DIAGNOSIS — D649 Anemia, unspecified: Secondary | ICD-10-CM

## 2019-12-13 DIAGNOSIS — I480 Paroxysmal atrial fibrillation: Secondary | ICD-10-CM

## 2019-12-13 NOTE — Progress Notes (Signed)
Here for routine DM check / stated feeling fatigue lately more exactly for the past 2 months

## 2019-12-13 NOTE — Progress Notes (Signed)
Established Patient Office Visit  Subjective:  Patient ID: Dawn Braun, female    DOB: December 22, 1965  Age: 54 y.o. MRN: [id number]  CC: f/u DM; recent onset fatigue  HPI Dawn Braun, 54 yo female who is seen in follow-up of diabetes with use of insulin as well as new complaint of increasing fatigue over the past few months. She has been following a healthy diet and remaining compliant with her medications. Blood sugars have been in the mid 80's-120's and no episodes of feeling as if her BS is too low.  She has had no issues with the sensation of increased heart rate or palpitations and no chest pain related to her afib. She has had no unusual bruising or bleeding. She denies any snoring. She does tend to fall asleep if she is sitting still or if things are quiet. She reports a prior normal sleep study. She feels that she is getting too much sleep. No anxiety or depression.   Past Medical History:  Diagnosis Date  . Asthma   . Complication of anesthesia    "takes me awhile to wakeup" (05/17/2017)  . Hypertension   . Intractable nausea and vomiting   . Paroxysmal atrial fibrillation (HCC)   . Type II diabetes mellitus (HCC)     Past Surgical History:  Procedure Laterality Date  . CESAREAN SECTION  2003  . CHOLECYSTECTOMY N/A 12/23/2016   Procedure: LAPAROSCOPIC CHOLECYSTECTOMY;  Surgeon: Sheliah Hatch De Blanch, MD;  Location: [facility];  Service: General;  Laterality: N/A;  . EYE SURGERY Right ~ 2016   "bleeding issues; lasered; cauterized leaks"  . KNEE ARTHROSCOPY Right X 2  . LAPAROTOMY N/A 03/26/2019   Procedure: EXPLORATORY LAPAROTOMY;  Surgeon: Darnell Level, MD;  Location: [facility];  Service: General;  Laterality: N/A;  . LYSIS OF ADHESION N/A 03/26/2019   Procedure: LYSIS OF ADHESION;  Surgeon: Darnell Level, MD;  Location: [facility];  Service: General;  Laterality: N/A;  . SPLIT NIGHT STUDY  08/06/2015  . TUBAL LIGATION  2003    Family History  Problem Relation Age of  Onset  . Epilepsy Father   . Diabetes Mother   . Kidney disease Mother   . Hypertension Mother   . Kidney disease Brother   . Diabetic kidney disease Brother   . Diabetes Brother     Social History   Socioeconomic History  . Marital status: Single    Spouse name: Not on file  . Number of children: 2  . Years of education: Not on file  . Highest education level: Not on file  Occupational History  . Occupation: Radio broadcast assistant  Tobacco Use  . Smoking status: Never Smoker  . Smokeless tobacco: Never Used  Vaping Use  . Vaping Use: Never used  Substance and Sexual Activity  . Alcohol use: Yes    Alcohol/week: 1.0 standard drink    Types: 1 Glasses of wine per week    Comment: 05/17/2017 "glass of wine q other weekend"  . Drug use: No  . Sexual activity: Yes    Birth control/protection: Surgical  Other Topics Concern  . Not on file  Social History Narrative  . Not on file   Social Determinants of Health   Financial Resource Strain:   . Difficulty of Paying Living Expenses: Not on file  Food Insecurity:   . Worried About Programme researcher, broadcasting/film/video in the Last Year: Not on file  . Ran Out of Food in the Last Year: Not  on file  Transportation Needs:   . Lack of Transportation (Medical): Not on file  . Lack of Transportation (Non-Medical): Not on file  Physical Activity:   . Days of Exercise per Week: Not on file  . Minutes of Exercise per Session: Not on file  Stress:   . Feeling of Stress : Not on file  Social Connections:   . Frequency of Communication with Friends and Family: Not on file  . Frequency of Social Gatherings with Friends and Family: Not on file  . Attends Religious Services: Not on file  . Active Member of Clubs or Organizations: Not on file  . Attends BankerClub or Organization Meetings: Not on file  . Marital Status: Not on file  Intimate Partner Violence:   . Fear of Current or Ex-Partner: Not on file  . Emotionally Abused: Not on file  . Physically  Abused: Not on file  . Sexually Abused: Not on file    Outpatient Medications Prior to Visit  Medication Sig Dispense Refill  . atorvastatin (LIPITOR) 20 MG tablet Take 1 tablet (20 mg total) by mouth daily. 90 tablet 3  . cetirizine (ZYRTEC) 10 MG tablet Take 1 tablet (10 mg total) by mouth daily. 30 tablet 11  . diltiazem (CARDIZEM CD) 120 MG 24 hr capsule Take 1 capsule by mouth once daily 90 capsule 0  . doxycycline (VIBRAMYCIN) 100 MG capsule Take 1 capsule (100 mg total) by mouth 2 (two) times daily. 20 capsule 0  . flecainide (TAMBOCOR) 100 MG tablet Take 1 tablet (100 mg total) by mouth 2 (two) times daily. 60 tablet 11  . HYDROcodone-acetaminophen (NORCO/VICODIN) 5-325 MG tablet Take 1 tablet by mouth every 4 (four) hours as needed. (Patient not taking: Reported on 08/03/2019) 10 tablet 0  . insulin isophane & regular human (NOVOLIN 70/30 FLEXPEN) (70-30) 100 UNIT/ML KwikPen Inject 9 Units into the skin 2 (two) times daily. 15 mL 3  . Insulin Pen Needle 31G X 4 MM MISC 1 Container by Does not apply route 2 (two) times daily. 100 each 0  . lisinopril (ZESTRIL) 2.5 MG tablet Take 1 tablet (2.5 mg total) by mouth daily. 90 tablet 1  . metFORMIN (GLUCOPHAGE) 500 MG tablet Take 2 in  the morning with food and 1 in the evening with food 90 tablet 3  . metoprolol tartrate (LOPRESSOR) 25 MG tablet Take 1 tablet (25 mg total) by mouth 2 (two) times daily. 60 tablet 0  . XARELTO 20 MG TABS tablet TAKE 1 TABLET BY MOUTH ONCE DAILY WITH  SUPPER 90 tablet 0   No facility-administered medications prior to visit.    No Known Allergies  ROS Review of Systems  Constitutional: Positive for fatigue. Negative for chills and fever.  HENT: Negative for nosebleeds, sore throat and trouble swallowing.   Eyes: Positive for visual disturbance. Negative for photophobia.       Reports recent DM eye exam and was then referred to retinal specialist  Respiratory: Negative for cough and shortness of breath.     Cardiovascular: Negative for chest pain, palpitations and leg swelling.  Gastrointestinal: Negative for abdominal pain, blood in stool, constipation, diarrhea and nausea.  Endocrine: Negative for polydipsia, polyphagia and polyuria.  Genitourinary: Negative for dysuria and frequency.  Musculoskeletal: Negative for back pain and myalgias.  Neurological: Negative for dizziness and headaches.  Hematological: Negative for adenopathy. Does not bruise/bleed easily.  Psychiatric/Behavioral: Positive for sleep disturbance (increased sleep; falls asleep easily). The patient is not nervous/anxious.  Objective:    Physical Exam Vitals and nursing note reviewed.  Constitutional:      General: She is not in acute distress.    Appearance: Normal appearance.     Comments: WNWD overweight older female in NAD, initially sitting in exam chair and sleeping when I entered the room  HENT:     Ears:     Comments: Wearing hearing aids bilaterally Neck:     Vascular: No carotid bruit.  Cardiovascular:     Rate and Rhythm: Normal rate and regular rhythm.     Pulses:          Dorsalis pedis pulses are 1+ on the right side and 1+ on the left side.       Posterior tibial pulses are 1+ on the right side and 1+ on the left side.  Pulmonary:     Effort: Pulmonary effort is normal.     Breath sounds: Normal breath sounds.  Abdominal:     Tenderness: There is no abdominal tenderness. There is no right CVA tenderness, left CVA tenderness, guarding or rebound.  Musculoskeletal:        General: No tenderness.     Cervical back: Normal range of motion and neck supple.     Right lower leg: No edema.     Left lower leg: No edema.     Right foot: Normal range of motion. No deformity.     Left foot: Normal range of motion. No deformity.  Feet:     Right foot:     Protective Sensation: 10 sites tested. 10 sites sensed.     Left foot:     Protective Sensation: 10 sites tested. 8 sites sensed.   Lymphadenopathy:     Cervical: No cervical adenopathy.  Skin:    General: Skin is warm and dry.  Neurological:     General: No focal deficit present.     Mental Status: She is alert and oriented to person, place, and time.  Psychiatric:        Mood and Affect: Mood normal.        Behavior: Behavior normal.     BP 137/83   Pulse 68   Temp 98.1 F (36.7 C)   Resp 16   Ht 5\' 2"  (1.575 m)   Wt 204 lb (92.5 kg)   SpO2 100%   BMI 37.31 kg/m  Wt Readings from Last 3 Encounters:  12/13/19 204 lb (92.5 kg)  08/03/19 202 lb (91.6 kg)  07/31/19 200 lb 12.8 oz (91.1 kg)     Health Maintenance Due  Topic Date Due  . Hepatitis C Screening  Never done  . FOOT EXAM  Never done  . OPHTHALMOLOGY EXAM  Never done  . COVID-19 Vaccine (1) Never done  . TETANUS/TDAP  Never done  . PAP SMEAR-Modifier  Never done  . MAMMOGRAM  Never done  . COLONOSCOPY  Never done      Lab Results  Component Value Date   TSH 1.502 05/17/2017   Lab Results  Component Value Date   WBC 8.6 08/03/2019   HGB 11.0 (L) 08/03/2019   HCT 33.9 (L) 08/03/2019   MCV 83 08/03/2019   PLT 406 08/03/2019   Lab Results  Component Value Date   NA 140 08/03/2019   K 5.7 (H) 08/03/2019   CO2 18 (L) 08/03/2019   GLUCOSE 115 (H) 08/03/2019   BUN 29 (H) 08/03/2019   CREATININE 1.12 (H) 08/03/2019   BILITOT <0.2 08/03/2019  ALKPHOS 131 (H) 08/03/2019   AST 29 08/03/2019   ALT 20 08/03/2019   PROT 7.1 08/03/2019   ALBUMIN 4.0 08/03/2019   CALCIUM 9.5 08/03/2019   ANIONGAP 9 07/16/2019   Lab Results  Component Value Date   CHOL 133 08/03/2019   Lab Results  Component Value Date   HDL 59 08/03/2019   Lab Results  Component Value Date   LDLCALC 62 08/03/2019   Lab Results  Component Value Date   TRIG 54 08/03/2019   Lab Results  Component Value Date   CHOLHDL 2.3 08/03/2019   Lab Results  Component Value Date   HGBA1C 6.7 (A) 08/03/2019      Assessment & Plan:  1. Type 2 diabetes  mellitus with stage 3a chronic kidney disease, with long-term current use of insulin (HCC) Last Hgb A1c was 6.7 on 08/03/2019 and will be rechecked in addition to CMET and thyroid hormones as she also has complaint of fatigue. She will continue the use of her Novolog 70/30 and metformin. Continue monitoring of blood sugars and healthy diet along with low impact exercise as tolerated.  - Comprehensive metabolic panel - Hemoglobin A1c - T4 AND TSH  2. Stage 3a chronic kidney disease Will check renal function as part of CMET as well as CBC to look for anemia related to renal disease.  - CBC with Differential  3. Fatigue, unspecified type She reports a recent increase in fatigue. She reports prior normal sleep study and has been told by her husband that she does not have any issues with snoring.  (I could not find her actual sleep study results but it appears that she may have gone for a split night sleep study in May of 2017. She continues to have good control of her DM without any significant hypoglycemia.  She will have blood work including comprehensive metabolic panel to look for electrolyte or liver dysfunction, T4 and TSH to look for thyroid disorder, vitamin D level to look for vitamin D deficiency, vitamin B12 level to look for vitamin B12 deficiency as patient is on Metformin which can lower vitamin B12 levels and additionally will have CBC to look for anemia or blood disorder which may be contributing to her recent onset of fatigue.  Additionally she has been asked to have screening mammogram as well as screening colonoscopy to look for breast cancer or colon cancer which could be contributing to fatigue.  She is encouraged to continue healthy diet, regular low impact exercise as tolerated and maintaining a healthy weight. She would also like referral to GYN for pap/pelvic exam. - Comprehensive metabolic panel - T4 AND TSH - Vitamin D, 25-hydroxy - Vitamin B12 - CBC with Differential  4.  Mild anemia CBC in follow-up of mild anemia as patient with Hgb of 11.0 with normal MCV of 83 on 08/03/2019. She is also being referred for screening colonoscopy/colon cancer screening.  - CBC with Differential  5. Screening for colon cancer Order placed for screening colonoscopy- patient is currently on Xarelto and GI will need to converse with patient's cardiologist regarding holding anticoagulant for colonoscopy or using a different method for screening - Ambulatory referral to Gastroenterology  6. Encounter for screening mammogram for malignant neoplasm of breast Order placed for screening mammogram - MM Digital Screening; Future  7. Paroxysmal atrial fibrillation (HCC) 8. Long term current use of anticoagulant Currently in normal sinus rhythm with controlled rate. Denies any unusual bleeding. CBC in follow-up of anticoagulant use.  - CBC  with Differential  9. Screening for cervical cancer She requests referral to GYN for pelvic and pap - Ambulatory referral to Gynecology     Follow-up: Return in about 4 weeks (around 01/10/2020) for annual well exam.    Cain Saupe, MD

## 2019-12-14 ENCOUNTER — Other Ambulatory Visit: Payer: Self-pay | Admitting: Family Medicine

## 2019-12-14 DIAGNOSIS — Z1231 Encounter for screening mammogram for malignant neoplasm of breast: Secondary | ICD-10-CM

## 2019-12-14 LAB — CBC WITH DIFFERENTIAL/PLATELET
Basophils Absolute: 0.1 x10E3/uL (ref 0.0–0.2)
Basos: 1 %
EOS (ABSOLUTE): 0.3 x10E3/uL (ref 0.0–0.4)
Eos: 4 %
Hematocrit: 36 % (ref 34.0–46.6)
Hemoglobin: 11.4 g/dL (ref 11.1–15.9)
Immature Grans (Abs): 0 x10E3/uL (ref 0.0–0.1)
Immature Granulocytes: 0 %
Lymphocytes Absolute: 2.8 x10E3/uL (ref 0.7–3.1)
Lymphs: 38 %
MCH: 26.9 pg (ref 26.6–33.0)
MCHC: 31.7 g/dL (ref 31.5–35.7)
MCV: 85 fL (ref 79–97)
Monocytes Absolute: 0.5 x10E3/uL (ref 0.1–0.9)
Monocytes: 7 %
Neutrophils Absolute: 3.7 x10E3/uL (ref 1.4–7.0)
Neutrophils: 50 %
Platelets: 342 x10E3/uL (ref 150–450)
RBC: 4.24 x10E6/uL (ref 3.77–5.28)
RDW: 13.8 % (ref 11.7–15.4)
WBC: 7.4 x10E3/uL (ref 3.4–10.8)

## 2019-12-14 LAB — COMPREHENSIVE METABOLIC PANEL WITH GFR
ALT: 15 IU/L (ref 0–32)
AST: 18 IU/L (ref 0–40)
Albumin/Globulin Ratio: 1.3 (ref 1.2–2.2)
Albumin: 4 g/dL (ref 3.8–4.9)
Alkaline Phosphatase: 114 IU/L (ref 48–121)
BUN/Creatinine Ratio: 29 — ABNORMAL HIGH (ref 9–23)
BUN: 45 mg/dL — ABNORMAL HIGH (ref 6–24)
Bilirubin Total: <0.2 mg/dL (ref 0.0–1.2)
CO2: 19 mmol/L — ABNORMAL LOW (ref 20–29)
Calcium: 9.8 mg/dL (ref 8.7–10.2)
Chloride: 109 mmol/L — ABNORMAL HIGH (ref 96–106)
Creatinine, Ser: 1.57 mg/dL — ABNORMAL HIGH (ref 0.57–1.00)
GFR calc Af Amer: 42 mL/min/1.73 — ABNORMAL LOW
GFR calc non Af Amer: 37 mL/min/1.73 — ABNORMAL LOW
Globulin, Total: 3 g/dL (ref 1.5–4.5)
Glucose: 86 mg/dL (ref 65–99)
Potassium: 5.7 mmol/L — ABNORMAL HIGH (ref 3.5–5.2)
Sodium: 141 mmol/L (ref 134–144)
Total Protein: 7 g/dL (ref 6.0–8.5)

## 2019-12-14 LAB — HEMOGLOBIN A1C
Est. average glucose Bld gHb Est-mCnc: 143 mg/dL
Hgb A1c MFr Bld: 6.6 % — ABNORMAL HIGH (ref 4.8–5.6)

## 2019-12-14 LAB — T4 AND TSH
T4, Total: 6.6 ug/dL (ref 4.5–12.0)
TSH: 1.1 u[IU]/mL (ref 0.450–4.500)

## 2019-12-14 LAB — VITAMIN D 25 HYDROXY (VIT D DEFICIENCY, FRACTURES): Vit D, 25-Hydroxy: 15.9 ng/mL — ABNORMAL LOW (ref 30.0–100.0)

## 2019-12-14 LAB — VITAMIN B12: Vitamin B-12: 555 pg/mL (ref 232–1245)

## 2019-12-15 ENCOUNTER — Ambulatory Visit: Payer: Medicaid Other | Admitting: Family

## 2019-12-17 ENCOUNTER — Other Ambulatory Visit: Payer: Self-pay | Admitting: Family Medicine

## 2019-12-17 DIAGNOSIS — E875 Hyperkalemia: Secondary | ICD-10-CM

## 2019-12-17 DIAGNOSIS — N1831 Chronic kidney disease, stage 3a: Secondary | ICD-10-CM

## 2019-12-17 DIAGNOSIS — E559 Vitamin D deficiency, unspecified: Secondary | ICD-10-CM

## 2019-12-17 MED ORDER — VITAMIN D (ERGOCALCIFEROL) 1.25 MG (50000 UNIT) PO CAPS: 50000.0000 [IU] | ORAL_CAPSULE | ORAL | 0 refills | Status: DC

## 2019-12-29 ENCOUNTER — Ambulatory Visit: Payer: Medicaid Other | Attending: Family Medicine

## 2019-12-29 ENCOUNTER — Other Ambulatory Visit: Payer: Self-pay

## 2019-12-29 DIAGNOSIS — N1831 Chronic kidney disease, stage 3a: Secondary | ICD-10-CM

## 2019-12-29 DIAGNOSIS — E875 Hyperkalemia: Secondary | ICD-10-CM

## 2019-12-30 LAB — BASIC METABOLIC PANEL WITH GFR
BUN/Creatinine Ratio: 32 — ABNORMAL HIGH (ref 9–23)
BUN: 39 mg/dL — ABNORMAL HIGH (ref 6–24)
CO2: 20 mmol/L (ref 20–29)
Calcium: 9.5 mg/dL (ref 8.7–10.2)
Chloride: 109 mmol/L — ABNORMAL HIGH (ref 96–106)
Creatinine, Ser: 1.23 mg/dL — ABNORMAL HIGH (ref 0.57–1.00)
GFR calc Af Amer: 57 mL/min/1.73 — ABNORMAL LOW
GFR calc non Af Amer: 49 mL/min/1.73 — ABNORMAL LOW
Glucose: 107 mg/dL — ABNORMAL HIGH (ref 65–99)
Potassium: 5 mmol/L (ref 3.5–5.2)
Sodium: 140 mmol/L (ref 134–144)

## 2020-01-01 NOTE — Progress Notes (Signed)
Normal/stable result letter sent via MyChart 

## 2020-01-02 ENCOUNTER — Ambulatory Visit: Payer: Medicaid Other

## 2020-01-15 ENCOUNTER — Other Ambulatory Visit: Payer: Self-pay | Admitting: Physician Assistant

## 2020-01-15 DIAGNOSIS — E1165 Type 2 diabetes mellitus with hyperglycemia: Secondary | ICD-10-CM

## 2020-01-15 DIAGNOSIS — IMO0002 Reserved for concepts with insufficient information to code with codable children: Secondary | ICD-10-CM

## 2020-01-15 NOTE — Telephone Encounter (Signed)
Requested Prescriptions  Pending Prescriptions Disp Refills  . metFORMIN (GLUCOPHAGE) 500 MG tablet [Pharmacy Med Name: metFORMIN HCl 500 MG Oral Tablet] 270 tablet 1    Sig: TAKE 2 TABLETS BY MOUTH IN THE MORNING WITH FOOD AND 1 TABLET IN THE EVENING WITH FOOD     Endocrinology:  Diabetes - Biguanides Failed - 01/15/2020 10:22 PM      Failed - Cr in normal range and within 360 days    Creatinine, Ser  Date Value Ref Range Status  12/29/2019 1.23 (H) 0.57 - 1.00 mg/dL Final   Creatinine, Urine  Date Value Ref Range Status  12/24/2016 278.42 mg/dL Final    Comment:    Performed at [facility], [place] [address], [place], [place] [address]  12/24/2016 277.32 mg/dL Final         Failed - eGFR in normal range and within 360 days    GFR calc Af Amer  Date Value Ref Range Status  12/29/2019 57 (L) >59 mL/min/1.73 Final    Comment:    **[facility] currently reports eGFR in compliance with the current**   recommendations of the [employer]. [facility] will   update reporting as new guidelines are published from the NKF-ASN   Task force.    GFR calc non Af Amer  Date Value Ref Range Status  12/29/2019 49 (L) >59 mL/min/1.73 Final         Passed - HBA1C is between 0 and 7.9 and within 180 days    Hgb A1c MFr Bld  Date Value Ref Range Status  12/13/2019 6.6 (H) 4.8 - 5.6 % Final    Comment:             Prediabetes: 5.7 - 6.4          Diabetes: >6.4          Glycemic control for adults with diabetes: <7.0          Passed - Valid encounter within last 6 months    Recent Outpatient Visits          1 month ago Type 2 diabetes mellitus with stage 3a chronic kidney disease, with long-term current use of insulin ([place])   [place] Fulp, [place], MD   5 months ago Diabetes mellitus type 2, uncontrolled, with complications [facility])   [place], [place], [place]   7 months ago Diabetes mellitus type 2,  uncontrolled, with complications [facility])   [place], Annie Main L, RPH-CPP   8 months ago Diabetes mellitus type 2, uncontrolled, with complications [facility])   [place] Antony Blackbird, MD      Future Appointments            In 2 weeks Buford Dresser, MD [facility] [place], [facility]

## 2020-01-18 ENCOUNTER — Encounter: Payer: Self-pay | Admitting: Internal Medicine

## 2020-01-20 ENCOUNTER — Other Ambulatory Visit: Payer: Self-pay | Admitting: Family Medicine

## 2020-01-20 NOTE — Telephone Encounter (Signed)
Requested Prescriptions  Pending Prescriptions Disp Refills  . diltiazem (CARDIZEM CD) 120 MG 24 hr capsule [Pharmacy Med Name: dilTIAZem HCl ER Coated Beads 120 MG Oral Capsule Extended Release 24 Hour] 90 capsule 1    Sig: Take 1 capsule by mouth once daily     Cardiovascular:  Calcium Channel Blockers Passed - 01/20/2020 11:35 PM      Passed - Last BP in normal range    BP Readings from Last 1 Encounters:  12/13/19 137/83         Passed - Valid encounter within last 6 months    Recent Outpatient Visits          1 month ago Type 2 diabetes mellitus with stage 3a chronic kidney disease, with long-term current use of insulin (HCC)   [facility] [place], [place], [place]   5 months ago Diabetes mellitus type 2, uncontrolled, with complications [facility])   [place] [facility] [place], [place], [place]   8 months ago Diabetes mellitus type 2, uncontrolled, with complications [facility])   [facility] [place], Jeannett Senior L, RPH-CPP   8 months ago Diabetes mellitus type 2, uncontrolled, with complications [facility])   [place] [facility] [facility] Cain Saupe, MD      Future Appointments            In 1 week Jodelle Red, MD [facility] [place], [facility]

## 2020-01-23 ENCOUNTER — Other Ambulatory Visit: Payer: Self-pay | Admitting: Family Medicine

## 2020-01-23 NOTE — Telephone Encounter (Signed)
Requested Prescriptions  Pending Prescriptions Disp Refills  . lisinopril (ZESTRIL) 2.5 MG tablet [Pharmacy Med Name: Lisinopril 2.5 MG Oral Tablet] 90 tablet 1    Sig: Take 1 tablet by mouth once daily     Cardiovascular:  ACE Inhibitors Failed - 01/23/2020  4:08 AM      Failed - Cr in normal range and within 180 days    Creatinine, Ser  Date Value Ref Range Status  12/29/2019 1.23 (H) 0.57 - 1.00 mg/dL Final   Creatinine, Urine  Date Value Ref Range Status  12/24/2016 278.42 mg/dL Final    Comment:    Performed at [facility], [address] [place], [place] [address]  12/24/2016 277.32 mg/dL Final         Passed - K in normal range and within 180 days    Potassium  Date Value Ref Range Status  12/29/2019 5.0 3.5 - 5.2 mmol/L Final         Passed - Patient is not pregnant      Passed - Last BP in normal range    BP Readings from Last 1 Encounters:  12/13/19 137/83         Passed - Valid encounter within last 6 months    Recent Outpatient Visits          1 month ago Type 2 diabetes mellitus with stage 3a chronic kidney disease, with long-term current use of insulin (HCC)   [facility] [place], [place], [place]   5 months ago Diabetes mellitus type 2, uncontrolled, with complications [facility])   [facility] [place], Maish [place], [place]   8 months ago Diabetes mellitus type 2, uncontrolled, with complications [facility])   [facility] [place], Jeannett Senior L, RPH-CPP   9 months ago Diabetes mellitus type 2, uncontrolled, with complications [facility])   [place] [facility] Cain Saupe, MD      Future Appointments            In 1 week Jodelle Red, MD [facility] [place], [facility]

## 2020-01-30 ENCOUNTER — Encounter: Payer: Self-pay | Admitting: Cardiology

## 2020-01-30 ENCOUNTER — Ambulatory Visit: Payer: Medicaid Other

## 2020-01-30 ENCOUNTER — Ambulatory Visit (INDEPENDENT_AMBULATORY_CARE_PROVIDER_SITE_OTHER): Payer: 59 | Admitting: Cardiology

## 2020-01-30 ENCOUNTER — Other Ambulatory Visit: Payer: Self-pay

## 2020-01-30 ENCOUNTER — Ambulatory Visit
Admission: RE | Admit: 2020-01-30 | Discharge: 2020-01-30 | Disposition: A | Discharge status: Home / Self Care | Payer: Medicaid Other | Source: Ambulatory Visit | Attending: Family Medicine | Admitting: Family Medicine

## 2020-01-30 VITALS — BP 110/80 | HR 72 | Ht 62.0 in | Wt 216.0 lb

## 2020-01-30 DIAGNOSIS — E1169 Type 2 diabetes mellitus with other specified complication: Secondary | ICD-10-CM

## 2020-01-30 DIAGNOSIS — I10 Essential (primary) hypertension: Secondary | ICD-10-CM | POA: Diagnosis not present

## 2020-01-30 DIAGNOSIS — I480 Paroxysmal atrial fibrillation: Secondary | ICD-10-CM | POA: Diagnosis not present

## 2020-01-30 DIAGNOSIS — E7800 Pure hypercholesterolemia, unspecified: Secondary | ICD-10-CM

## 2020-01-30 DIAGNOSIS — Z1231 Encounter for screening mammogram for malignant neoplasm of breast: Secondary | ICD-10-CM

## 2020-01-30 DIAGNOSIS — D6869 Other thrombophilia: Secondary | ICD-10-CM | POA: Diagnosis not present

## 2020-01-30 DIAGNOSIS — E669 Obesity, unspecified: Secondary | ICD-10-CM

## 2020-01-30 DIAGNOSIS — Z7189 Other specified counseling: Secondary | ICD-10-CM

## 2020-01-30 NOTE — Patient Instructions (Signed)

## 2020-01-30 NOTE — Progress Notes (Signed)
Cardiology Office Note:    Date:  01/30/2020   ID:  [name], DOB 09-05-1965, MRN [id number]  PCP:  Cain Saupe, MD  Cardiologist:  Jodelle Red, MD  Referring MD: Cain Saupe, MD   CC: follow up  History of Present Illness:    Dawn Braun is a 54 y.o. female with a hx of prior SBO s/p surgery, uncontrolled type 2 diabetes with history of DKA, paroxysmal atrial fibrillation followed by afib clinic, asthma, stage 3 CKD with history of AKI, obesity who is seen for follow up today. I initially saw her 05/01/19 as a new consult at the request of Fulp, Cammie, MD for the evaluation and management of paroxysmal atrial fibrillation.  Today: Continues to have fatigue, has been going on for several months. Saw Dr. Jillyn Hidden, had labs done, reviewed today. Sleeps well, but wakes up fatigued. Had normal sleep study in the past. In good spirits, not depressed.   Has right lateral tingling of the leg with sitting. Better with changing position. Examined, consistent with meralgia parastetica.   Not bothered at all by her afib. On flecainide, diltiazem, and rivaroxaban. Flecainide managed by afib clinic, just refilled.  Denies chest pain, shortness of breath at rest or with normal exertion. No PND, orthopnea, LE edema or unexpected weight gain. No syncope or palpitations.  Past Medical History:  Diagnosis Date  . Asthma   . Complication of anesthesia    "takes me awhile to wakeup" (05/17/2017)  . Hypertension   . Intractable nausea and vomiting   . Paroxysmal atrial fibrillation (HCC)   . Type II diabetes mellitus (HCC)     Past Surgical History:  Procedure Laterality Date  . CESAREAN SECTION  2003  . CHOLECYSTECTOMY N/A 12/23/2016   Procedure: LAPAROSCOPIC CHOLECYSTECTOMY;  Surgeon: Sheliah Hatch De Blanch, MD;  Location: [facility];  Service: General;  Laterality: N/A;  . EYE SURGERY Right ~ 2016   "bleeding issues; lasered; cauterized leaks"  . KNEE ARTHROSCOPY Right X  2  . LAPAROTOMY N/A 03/26/2019   Procedure: EXPLORATORY LAPAROTOMY;  Surgeon: Darnell Level, MD;  Location: [facility];  Service: General;  Laterality: N/A;  . LYSIS OF ADHESION N/A 03/26/2019   Procedure: LYSIS OF ADHESION;  Surgeon: Darnell Level, MD;  Location: [facility];  Service: General;  Laterality: N/A;  . SPLIT NIGHT STUDY  08/06/2015  . TUBAL LIGATION  2003    Current Medications: Current Outpatient Medications on File Prior to Visit  Medication Sig  . atorvastatin (LIPITOR) 20 MG tablet Take 1 tablet (20 mg total) by mouth daily.  . cetirizine (ZYRTEC) 10 MG tablet Take 1 tablet (10 mg total) by mouth daily.  [name] diltiazem (CARDIZEM CD) 120 MG 24 hr capsule Take 1 capsule by mouth once daily  . Insulin Pen Needle 31G X 4 MM MISC 1 Container by Does not apply route 2 (two) times daily.  [name] lisinopril (ZESTRIL) 2.5 MG tablet Take 1 tablet by mouth once daily  . metFORMIN (GLUCOPHAGE) 500 MG tablet TAKE 2 TABLETS BY MOUTH IN THE MORNING WITH FOOD AND 1 TABLET IN THE EVENING WITH FOOD  . Vitamin D, Ergocalciferol, (DRISDOL) 1.25 MG (50000 UNIT) CAPS capsule Take 1 capsule (50,000 Units total) by mouth every 7 (seven) days.  Carlena Hurl 20 MG TABS tablet TAKE 1 TABLET BY MOUTH ONCE DAILY WITH  SUPPER  . insulin isophane & regular human (NOVOLIN 70/30 FLEXPEN) (70-30) 100 UNIT/ML KwikPen Inject 9 Units into the skin 2 (two) times daily.  No current facility-administered medications on file prior to visit.     Allergies:   Patient has no known allergies.   Social History   Tobacco Use  . Smoking status: Never Smoker  . Smokeless tobacco: Never Used  Vaping Use  . Vaping Use: Never used  Substance Use Topics  . Alcohol use: Yes    Alcohol/week: 1.0 standard drink    Types: 1 Glasses of wine per week    Comment: 05/17/2017 "glass of wine q other weekend"  . Drug use: No    Family History: family history includes Diabetes in her brother and mother; Diabetic kidney disease in her brother;  Epilepsy in her father; Hypertension in her mother; Kidney disease in her brother and mother.  ROS:   Please see the history of present illness.  Additional pertinent ROS otherwise unremarkable.  EKGs/Labs/Other Studies Reviewed:    The following studies were reviewed today: Echo 12/2017, ETT 01/31/16, echo 07/2015  EKG:  EKG is personally reviewed.  The ekg ordered today demonstrates NSR, LPFB  Recent Labs: 12/13/2019: ALT 15; Hemoglobin 11.4; Platelets 342; TSH 1.100 12/29/2019: BUN 39; Creatinine, Ser 1.23; Potassium 5.0; Sodium 140  Recent Lipid Panel    Component Value Date/Time   CHOL 133 08/03/2019 0947   TRIG 54 08/03/2019 0947   HDL 59 08/03/2019 0947   CHOLHDL 2.3 08/03/2019 0947   LDLCALC 62 08/03/2019 0947    Physical Exam:    VS:  BP 110/80   Pulse 72   Ht 5\' 2"  (1.575 m)   Wt 216 lb (98 kg)   SpO2 98%   BMI 39.51 kg/m     Wt Readings from Last 3 Encounters:  01/30/20 216 lb (98 kg)  12/13/19 204 lb (92.5 kg)  08/03/19 202 lb (91.6 kg)    GEN: Well nourished, well developed in no acute distress HEENT: Normal, moist mucous membranes NECK: No JVD CARDIAC: regular rhythm, normal S1 and S2, no rubs or gallops. No murmur. VASCULAR: Radial and DP pulses 2+ bilaterally. No carotid bruits RESPIRATORY:  Clear to auscultation without rales, wheezing or rhonchi  ABDOMEN: Soft, non-tender, non-distended MUSCULOSKELETAL:  Ambulates independently SKIN: Warm and dry, no edema NEUROLOGIC:  Alert and oriented x 3. No focal neuro deficits noted. PSYCHIATRIC:  Normal affect   ASSESSMENT:    1. Paroxysmal atrial fibrillation (HCC)   2. Essential hypertension   3. Hypercholesterolemia   4. Secondary hypercoagulable state (HCC)   5. Cardiac risk counseling   6. Counseling on health promotion and disease prevention   7. Diabetes mellitus type 2 in obese [facility])    PLAN:    Paroxysmal atrial fibrillation: -CHA2DS2/VAS Stroke Risk Points= 3  -on diltiazem,  flecainide -on rivaroxaban for long term anticoagulation -reviewed risk of arrhythmia on flecainide with ischemia  Hypertension: Goal <130/80 -at goal today -on metoprolol, diltiazem, lisinopril -instructed on home BP measurements  Hypercholesterolemia:  -tolerating atorvastatin 20 mg daily -most recent lipids, LFTs reviewed  Type II diabetes -A1c much improved to 6.6 on recent check -BMI 39 -on atorvastatin -no aspirin as she is on rivaroxaban  Cardiac risk counseling and prevention recommendations: -recommend heart healthy/Mediterranean diet, with whole grains, fruits, vegetable, fish, lean meats, nuts, and olive oil. Limit salt. -recommend moderate walking, 3-5 times/week for 30-50 minutes each session. Aim for at least 150 minutes.week. Goal should be pace of 3 miles/hours, or walking 1.5 miles in 30 minutes -recommend avoidance of tobacco products. Avoid excess alcohol. -ASCVD risk score: The 10-year ASCVD  risk score Denman George DC Jr., et al., 2013) is: 4.4%   Values used to calculate the score:     Age: 17 years     Sex: Female     Is Non-Hispanic African American: Yes     Diabetic: Yes     Tobacco smoker: No     Systolic Blood Pressure: 110 mmHg     Is BP treated: Yes     HDL Cholesterol: 59 mg/dL     Total Cholesterol: 133 mg/dL    Plan for follow up: 6 mos or sooner PRN  Jodelle Red, MD, PhD [place]  [facility]    Medication Adjustments/Labs and Tests Ordered: Current medicines are reviewed at length with the patient today.  Concerns regarding medicines are outlined above.  No orders of the defined types were placed in this encounter.  No orders of the defined types were placed in this encounter.   Patient Instructions  Medication Instructions:  Your Physician recommend you continue on your current medication as directed.    *If you need a refill on your cardiac medications before your next appointment, please call your pharmacy*   Lab  Work: None ordered    Testing/Procedures: None ordered    Follow-Up: At [facility], you and your health needs are our priority.  As part of our continuing mission to provide you with exceptional heart care, we have created designated Provider Care Teams.  These Care Teams include your primary Cardiologist (physician) and Advanced Practice Providers (APPs -  Physician Assistants and Nurse Practitioners) who all work together to provide you with the care you need, when you need it.  We recommend signing up for the patient portal called "MyChart".  Sign up information is provided on this After Visit Summary.  MyChart is used to connect with patients for Virtual Visits (Telemedicine).  Patients are able to view lab/test results, encounter notes, upcoming appointments, etc.  Non-urgent messages can be sent to your provider as well.   To learn more about what you can do with MyChart, go to [email or web address].    Your next appointment:   6 month(s)  The format for your next appointment:   In Person  Provider:   Jodelle Red, MD        Signed, Jodelle Red, MD PhD 01/30/2020  [facility]

## 2020-02-01 NOTE — Progress Notes (Signed)
Normal result letter generated, sent via MyChart and mailed to address on file. 

## 2020-02-05 ENCOUNTER — Other Ambulatory Visit: Payer: Self-pay | Admitting: Family Medicine

## 2020-02-05 NOTE — Telephone Encounter (Signed)
   Notes to clinic: Patient due for follow up labs Review to see if okay for refill before repeat labs   Requested Prescriptions  Pending Prescriptions Disp Refills   XARELTO 20 MG TABS tablet [Pharmacy Med Name: Xarelto 20 MG Oral Tablet] 30 tablet 0    Sig: TAKE 1 TABLET BY MOUTH ONCE DAILY WITH  SUPPER      Hematology: Anticoagulants - rivaroxaban Failed - 02/05/2020 10:21 PM      Failed - Cr in normal range and within 360 days    Creatinine, Ser  Date Value Ref Range Status  12/29/2019 1.23 (H) 0.57 - 1.00 mg/dL Final   Creatinine, Urine  Date Value Ref Range Status  12/24/2016 278.42 mg/dL Final    Comment:    Performed at [facility], [address], [place], [place] [address]  12/24/2016 277.32 mg/dL Final          Passed - ALT in normal range and within 180 days    ALT  Date Value Ref Range Status  12/13/2019 15 0 - 32 IU/L Final          Passed - AST in normal range and within 180 days    AST  Date Value Ref Range Status  12/13/2019 18 0 - 40 IU/L Final          Passed - HCT in normal range and within 360 days    Hematocrit  Date Value Ref Range Status  12/13/2019 36.0 34.0 - 46.6 % Final          Passed - HGB in normal range and within 360 days    Hemoglobin  Date Value Ref Range Status  12/13/2019 11.4 11.1 - 15.9 g/dL Final          Passed - PLT in normal range and within 360 days    Platelets  Date Value Ref Range Status  12/13/2019 342 150 - 450 x10E3/uL Final          Passed - Valid encounter within last 12 months    Recent Outpatient Visits           1 month ago Type 2 diabetes mellitus with stage 3a chronic kidney disease, with long-term current use of insulin (HCC)   [facility] Fulp, [place], MD   6 months ago Diabetes mellitus type 2, uncontrolled, with complications [facility])   [facility] [place], [place], [place]   8 months ago Diabetes mellitus type 2, uncontrolled,  with complications [facility])   [place] [place], Jeannett Senior L, RPH-CPP   9 months ago Diabetes mellitus type 2, uncontrolled, with complications [facility])   [facility] Cain Saupe, MD

## 2020-03-04 ENCOUNTER — Other Ambulatory Visit: Payer: Self-pay

## 2020-03-04 ENCOUNTER — Other Ambulatory Visit (HOSPITAL_COMMUNITY)
Admission: RE | Admit: 2020-03-04 | Discharge: 2020-03-04 | Disposition: A | Discharge status: Home / Self Care | Payer: 59 | Source: Ambulatory Visit | Attending: Obstetrics and Gynecology | Admitting: Obstetrics and Gynecology

## 2020-03-04 ENCOUNTER — Ambulatory Visit (INDEPENDENT_AMBULATORY_CARE_PROVIDER_SITE_OTHER): Payer: Commercial Managed Care - HMO | Admitting: Obstetrics and Gynecology

## 2020-03-04 ENCOUNTER — Encounter: Payer: Self-pay | Admitting: Obstetrics and Gynecology

## 2020-03-04 VITALS — Wt 217.0 lb

## 2020-03-04 DIAGNOSIS — Z01419 Encounter for gynecological examination (general) (routine) without abnormal findings: Secondary | ICD-10-CM | POA: Diagnosis not present

## 2020-03-04 DIAGNOSIS — Z124 Encounter for screening for malignant neoplasm of cervix: Secondary | ICD-10-CM

## 2020-03-04 NOTE — Progress Notes (Signed)
GYNECOLOGY ANNUAL PREVENTATIVE CARE ENCOUNTER NOTE  History:     Dawn Braun is a 54 y.o. G4P2 SVD x1 and c/s x 1 female here for a routine annual gynecologic exam.  Current complaints: none.   Denies abnormal vaginal bleeding, discharge, pelvic pain, problems with intercourse or other gynecologic concerns. Pt currently in menopause and started at age 26.  Pt does note hot flashes and night sweats.   Gynecologic History Patient's last menstrual period was 12/24/2015 (exact date). Contraception: menopausal Last Pap: 2018. Results were: normal with negative HPV Last mammogram: 01/30/2020. Results were: normal  Obstetric History OB History  No obstetric history on file.    Past Medical History:  Diagnosis Date  . Asthma   . Complication of anesthesia    "takes me awhile to wakeup" (05/17/2017)  . Hypertension   . Intractable nausea and vomiting   . Paroxysmal atrial fibrillation (HCC)   . Type II diabetes mellitus (HCC)     Past Surgical History:  Procedure Laterality Date  . CESAREAN SECTION  2003  . CHOLECYSTECTOMY N/A 12/23/2016   Procedure: LAPAROSCOPIC CHOLECYSTECTOMY;  Surgeon: Sheliah Hatch De Blanch, MD;  Location: [facility];  Service: General;  Laterality: N/A;  . EYE SURGERY Right ~ 2016   "bleeding issues; lasered; cauterized leaks"  . KNEE ARTHROSCOPY Right X 2  . LAPAROTOMY N/A 03/26/2019   Procedure: EXPLORATORY LAPAROTOMY;  Surgeon: Darnell Level, MD;  Location: [facility];  Service: General;  Laterality: N/A;  . LYSIS OF ADHESION N/A 03/26/2019   Procedure: LYSIS OF ADHESION;  Surgeon: Darnell Level, MD;  Location: [facility];  Service: General;  Laterality: N/A;  . SPLIT NIGHT STUDY  08/06/2015  . TUBAL LIGATION  2003    Current Outpatient Medications on File Prior to Visit  Medication Sig Dispense Refill  . atorvastatin (LIPITOR) 20 MG tablet Take 1 tablet (20 mg total) by mouth daily. 90 tablet 3  . cetirizine (ZYRTEC) 10 MG tablet Take 1 tablet (10 mg total)  by mouth daily. 30 tablet 11  . diltiazem (CARDIZEM CD) 120 MG 24 hr capsule Take 1 capsule by mouth once daily 90 capsule 1  . flecainide (TAMBOCOR) 100 MG tablet Take 100 mg by mouth 2 (two) times daily.    . Insulin Pen Needle 31G X 4 MM MISC 1 Container by Does not apply route 2 (two) times daily. 100 each 0  . lisinopril (ZESTRIL) 2.5 MG tablet Take 1 tablet by mouth once daily 90 tablet 1  . metFORMIN (GLUCOPHAGE) 500 MG tablet TAKE 2 TABLETS BY MOUTH IN THE MORNING WITH FOOD AND 1 TABLET IN THE EVENING WITH FOOD 270 tablet 1  . XARELTO 20 MG TABS tablet TAKE 1 TABLET BY MOUTH ONCE DAILY WITH  SUPPER 30 tablet 2  . insulin isophane & regular human (NOVOLIN 70/30 FLEXPEN) (70-30) 100 UNIT/ML KwikPen Inject 9 Units into the skin 2 (two) times daily. 15 mL 3  . Vitamin D, Ergocalciferol, (DRISDOL) 1.25 MG (50000 UNIT) CAPS capsule Take 1 capsule (50,000 Units total) by mouth every 7 (seven) days. (Patient not taking: Reported on 03/04/2020) 12 capsule 0   No current facility-administered medications on file prior to visit.    No Known Allergies  Social History:  reports that she has never smoked. She has never used smokeless tobacco. She reports current alcohol use of about 1.0 standard drink of alcohol per week. She reports that she does not use drugs.  Family History  Problem Relation Age  of Onset  . Epilepsy Father   . Diabetes Mother   . Kidney disease Mother   . Hypertension Mother   . Kidney disease Brother   . Diabetic kidney disease Brother   . Diabetes Brother     The following portions of the patient's history were reviewed and updated as appropriate: allergies, current medications, past family history, past medical history, past social history, past surgical history and problem list.  Review of Systems Pertinent items noted in HPI and remainder of comprehensive ROS otherwise negative.  Physical Exam:  Wt 217 lb (98.4 kg)   LMP 12/24/2015 (Exact Date)   BMI 39.69  kg/m  CONSTITUTIONAL: Well-developed, well-nourished female in no acute distress.  HENT:  Normocephalic, atraumatic, External right and left ear normal. Oropharynx is clear and moist EYES: Conjunctivae and EOM are normal.  NECK: Normal range of motion, supple, no masses.  Normal thyroid.  SKIN: Skin is warm and dry. No rash noted. Not diaphoretic. No erythema. No pallor. MUSCULOSKELETAL: Normal range of motion. No tenderness.  No cyanosis, clubbing, or edema.  2+ distal pulses. NEUROLOGIC: Alert and oriented to person, place, and time. Normal reflexes, muscle tone coordination.  PSYCHIATRIC: Normal mood and affect. Normal behavior. Normal judgment and thought content. CARDIOVASCULAR: Normal heart rate noted, regular rhythm RESPIRATORY: Clear to auscultation bilaterally. Effort and breath sounds normal, no problems with respiration noted. BREASTS: Symmetric in size. No masses, tenderness, skin changes, nipple drainage, or lymphadenopathy bilaterally. Performed in the presence of a chaperone. ABDOMEN: Soft, no distention noted.  No tenderness, rebound or guarding. Midline and laparoscopic scars noted. PELVIC: Normal appearing external genitalia and urethral meatus; normal appearing vaginal mucosa and cervix.  No abnormal discharge noted.  Pap smear obtained.  Normal uterine size, no other palpable masses, no uterine or adnexal tenderness.  Performed in the presence of a chaperone.   Assessment and Plan:    1. Pap smear for cervical cancer screening  - Cytology - PAP( [facility])  2. Women's annual routine gynecological examination Grossly normal annual exam.  Did discuss nonhormonal treatment for hot flashes and night sweats ie SSRIs.  Pt declines at this time.  Will follow up results of pap smear and manage accordingly. Mammogram scheduled Routine preventative health maintenance measures emphasized. Please refer to After Visit Summary for other counseling recommendations.      Mariel Aloe, MD, FACOG Obstetrician & Gynecologist, [facility] for [facility], [facility]

## 2020-03-04 NOTE — Patient Instructions (Signed)

## 2020-03-06 LAB — CYTOLOGY - PAP
Comment: NEGATIVE
Diagnosis: NEGATIVE
Diagnosis: REACTIVE
High risk HPV: NEGATIVE

## 2020-03-11 ENCOUNTER — Encounter: Payer: Self-pay | Admitting: Internal Medicine

## 2020-03-11 ENCOUNTER — Telehealth: Payer: Self-pay

## 2020-03-11 ENCOUNTER — Ambulatory Visit: Payer: Medicaid Other | Admitting: Internal Medicine

## 2020-03-11 VITALS — BP 120/70 | HR 76 | Ht 62.0 in | Wt 215.0 lb

## 2020-03-11 DIAGNOSIS — Z1211 Encounter for screening for malignant neoplasm of colon: Secondary | ICD-10-CM | POA: Diagnosis not present

## 2020-03-11 DIAGNOSIS — Z7901 Long term (current) use of anticoagulants: Secondary | ICD-10-CM | POA: Diagnosis not present

## 2020-03-11 DIAGNOSIS — N1831 Chronic kidney disease, stage 3a: Secondary | ICD-10-CM

## 2020-03-11 DIAGNOSIS — R112 Nausea with vomiting, unspecified: Secondary | ICD-10-CM | POA: Diagnosis not present

## 2020-03-11 DIAGNOSIS — R10812 Left upper quadrant abdominal tenderness: Secondary | ICD-10-CM | POA: Diagnosis not present

## 2020-03-11 MED ORDER — FAMOTIDINE 20 MG PO TABS: 20.0000 mg | ORAL_TABLET | Freq: Two times a day (BID) | ORAL | 5 refills | Status: DC

## 2020-03-11 NOTE — Patient Instructions (Addendum)
You have been scheduled for an endoscopy and colonoscopy. Please follow the written instructions given to you at your visit today. Please pick up your prep supplies at the pharmacy within the next 1-3 days. If you use inhalers (even only as needed), please bring them with you on the day of your procedure.   You will be contacted by our office prior to your procedure for directions on holding your Xarelto..  If you do not hear from our office 1 week prior to your scheduled procedure, please call [phone] to discuss.    We have sent the following medications to your pharmacy for you to pick up at your convenience: Famotidine, Dr Leone Payor is increasing your dosage to twice daily.  If you experience nausea early in morning check your sugar.   I appreciate the opportunity to care for you. Denice Paradise, [facility]

## 2020-03-11 NOTE — Telephone Encounter (Signed)
   Primary Cardiologist: Jodelle Red, MD  Chart reviewed as part of pre-operative protocol coverage. Patient is scheduled for a colonscopy on 05/02/2020 and we were asked to give our recommendations for holding Xarelto.   Per Pharmacy and office protocol, patient can hold Xarelto for 2 days prior to procedure. This should be restarted as soon as able following procedure.   I will route this recommendation to the requesting party via Epic fax function and remove from pre-op pool.  Please call with questions.  Corrin Parker, PA-C 03/11/2020, 1:32 PM

## 2020-03-11 NOTE — Telephone Encounter (Signed)
Pharmacy, can you please comment on how long Xarelto can be held for upcoming procedure?  Thank you! 

## 2020-03-11 NOTE — Progress Notes (Signed)
Dawn Braun 54 y.o. 1965/05/15 [id number] Referred by: Cain Saupe, MD  Assessment & Plan:   Encounter Diagnoses  Name Primary?  . Left upper quadrant abdominal tenderness without rebound tenderness Yes  . Nausea and vomiting, intractability of vomiting not specified, unspecified vomiting type   . Colon cancer screening   . Long term current use of anticoagulant - Xarelto   . Chronic kidney disease (CKD) stage G3a/A3, moderately decreased glomerular filtration rate (GFR) between 45-59 mL/min/1.73 square meter and albuminuria creatinine ratio greater than 300 mg/g (HCC)    Evaluate nausea and vomiting upper abdominal tenderness with an EGD.  I have asked her to check her blood sugar when 1 of these episodes occurs as I wonder if she might not be experiencing some transient hypoglycemia.  Add famotidine at bedtime as well.  She does have microalbuminuria but says she does not have neuropathy.  Her vomiting does not sound like diabetic autonomic neuropathy to me and she has had the normal gastric emptying study.  Screening colonoscopy.  Hold Xarelto 2 days prior to endoscopic procedures, rare but real increased risk of stroke related to A. fib discussed with the patient but risk-benefit ratio favors holding the Xarelto.  We will clarify with cardiology, Dr. Cristal Deer.  The typical risks and benefits as well as alternatives of endoscopic procedure(s) have been discussed and reviewed. All questions answered. The patient agrees to proceed.  Meds ordered this encounter  Medications  . famotidine (PEPCID) 20 MG tablet    Sig: Take 1 tablet (20 mg total) by mouth 2 (two) times daily. AM and bedtime    Dispense:  60 tablet    Refill:  5      Subjective:   Chief Complaint: Colon cancer screening taking Xarelto  HPI 54 year old African-American woman referred for colon cancer screening in the setting of anticoagulant therapy for paroxysmal atrial fibrillation.  Had a small  bowel obstruction last year and was hospitalized over the Christmas holidays had laparotomy with lysis of adhesions by Dr. Gerrit Friends..  GI history otherwise notable for chronic intermittent nausea problems, she says she has these episodes where she will awaken at about 4 AM feeling very nauseous and sometimes will vomit yellow slimy fluid.  She has not checked blood sugar during these periods but says it is normally okay i.e. not too low during her typical awakening around 7 or 8 AM.  She has had a normal gastric emptying study, negative post Coley ultrasound as part of a work-up by Dr. Elnoria Howard.  She was seen by him during hospitalization in February 2019.  Lap chole 12/24/2016 Dr. Sheliah Hatch inflamed gallbladder with pathology showing cholecystitis with gallstones.  GI review of systems is otherwise negative.      No Known Allergies Current Meds  Medication Sig  . atorvastatin (LIPITOR) 20 MG tablet Take 1 tablet (20 mg total) by mouth daily.  . cetirizine (ZYRTEC) 10 MG tablet Take 1 tablet (10 mg total) by mouth daily.  [name] diltiazem (CARDIZEM CD) 120 MG 24 hr capsule Take 1 capsule by mouth once daily  . famotidine (PEPCID) 20 MG tablet Take 1 tablet (20 mg total) by mouth 2 (two) times daily. AM and bedtime  . flecainide (TAMBOCOR) 100 MG tablet Take 100 mg by mouth 2 (two) times daily.  . Insulin Pen Needle 31G X 4 MM MISC 1 Container by Does not apply route 2 (two) times daily.  [name] lisinopril (ZESTRIL) 2.5 MG tablet Take 1 tablet by mouth once  daily  . loratadine (CLARITIN) 10 MG tablet Take 10 mg by mouth daily.  . metFORMIN (GLUCOPHAGE) 500 MG tablet TAKE 2 TABLETS BY MOUTH IN THE MORNING WITH FOOD AND 1 TABLET IN THE EVENING WITH FOOD  . montelukast (SINGULAIR) 10 MG tablet Take 10 mg by mouth daily.  Carlena Hurl 20 MG TABS tablet TAKE 1 TABLET BY MOUTH ONCE DAILY WITH  SUPPER   Past Medical History:  Diagnosis Date  . Asthma   . Chronic kidney disease (CKD) stage G3a/A3, moderately decreased  glomerular filtration rate (GFR) between 45-59 mL/min/1.73 square meter and albuminuria creatinine ratio greater than 300 mg/g (HCC)   . Complication of anesthesia    "takes me awhile to wakeup" (05/17/2017)  . Hypertension   . Intractable nausea and vomiting   . Microalbuminuria due to type 2 diabetes mellitus (HCC)   . Paroxysmal atrial fibrillation (HCC)   . Small bowel obstruction (HCC) 03/26/2019  . Type II diabetes mellitus (HCC)    Past Surgical History:  Procedure Laterality Date  . CESAREAN SECTION  2003  . CHOLECYSTECTOMY N/A 12/23/2016   Procedure: LAPAROSCOPIC CHOLECYSTECTOMY;  Surgeon: Sheliah Hatch De Blanch, MD;  Location: [facility];  Service: General;  Laterality: N/A;  . EYE SURGERY Right ~ 2016   "bleeding issues; lasered; cauterized leaks"  . KNEE ARTHROSCOPY Right X 2  . LAPAROTOMY N/A 03/26/2019   Procedure: EXPLORATORY LAPAROTOMY;  Surgeon: Darnell Level, MD;  Location: [facility];  Service: General;  Laterality: N/A;  . LYSIS OF ADHESION N/A 03/26/2019   Procedure: LYSIS OF ADHESION;  Surgeon: Darnell Level, MD;  Location: [facility];  Service: General;  Laterality: N/A;  . SPLIT NIGHT STUDY  08/06/2015  . TUBAL LIGATION  2003   Social History   Social History Narrative   Patient is single she has 1 son and 1 daughter   Came to [place] for [employer] and remained after school   Employed as a Teacher, English as a foreign language   Never smoker   1 alcoholic beverage weekly   2 caffeinated beverages daily   No drugs or tobacco   family history includes Diabetes in her brother and mother; Diabetic kidney disease in her brother; Epilepsy in her father; Hypertension in her mother; Kidney disease in her brother and mother.   Review of Systems Fatigue dyspnea pedal edema at times night sweats skin itching all other review of systems negative or as per HPI  Objective:   Physical Exam BP 120/70   Pulse 76   Ht 5\' 2"  (1.575 m)   Wt 215 lb (97.5 kg)   LMP 12/24/2015 (Exact Date)    SpO2 99%   BMI 39.32 kg/m  Well-developed well-nourished obese middle-aged black woman in no acute distress Eyes are anicteric The lungs are clear to auscultation bilaterally The heart sounds are normal with an S1-S2 no rubs murmurs or gallops regular rhythm and rate The abdomen is obese soft with well-healed upper midline scar, she is tender to deep palpation in the left upper quadrant without organomegaly mass and there is no detectable hernia.  Bowel sounds are present.  The tenderness is mild.  There is no rebound tenderness.  No guarding. She is alert and oriented x3 and has an appropriate mood and affect  Data reviewed includes hospitalization records and imaging from 03/26/2019 and imaging studies from 2019 as well 2021 labs in the EMR also

## 2020-03-11 NOTE — Telephone Encounter (Signed)
Patient dropped off paperwork needing to be filled out by 03/22/2020.

## 2020-03-11 NOTE — Telephone Encounter (Signed)
  [facility] Pre-operative Risk Assessment     Request for surgical clearance:     Endoscopy Procedure  What type of surgery is being performed?     EGD, colonoscopy  When is this surgery scheduled?     05/02/2020  What type of clearance is required ?   Pharmacy  Are there any medications that need to be held prior to surgery and how long? Xarelto, 2 days  Practice name and name of physician performing surgery?      [facility]  What is your office phone and fax number?      Phone- [phone]  [phone]  Anesthesia type (None, local, MAC, general) ?       MAC

## 2020-03-11 NOTE — Telephone Encounter (Signed)
Patient informed and verbalized understanding

## 2020-03-11 NOTE — Telephone Encounter (Signed)
Patient with diagnosis of afib on Xarelto for anticoagulation.    Procedure: EGD, colonoscopy Date of procedure: 05/02/20  CHA2DS2-VASc Score = 3  This indicates a 3.2% annual risk of stroke. The patient's score is based upon: CHF History: 0 HTN History: 1 Diabetes History: 1 Stroke History: 0 Vascular Disease History: 0 Age Score: 0 Gender Score: 1  CrCl 39mL/min using adjusted body weight Platelet count 342K  Per office protocol, patient can hold Xarelto for 2 days prior to procedure as requested.

## 2020-04-01 ENCOUNTER — Encounter: Payer: Self-pay | Admitting: Cardiology

## 2020-04-01 DIAGNOSIS — E7800 Pure hypercholesterolemia, unspecified: Secondary | ICD-10-CM | POA: Insufficient documentation

## 2020-04-01 DIAGNOSIS — I10 Essential (primary) hypertension: Secondary | ICD-10-CM | POA: Insufficient documentation

## 2020-04-01 DIAGNOSIS — D6869 Other thrombophilia: Secondary | ICD-10-CM | POA: Insufficient documentation

## 2020-04-29 ENCOUNTER — Other Ambulatory Visit: Payer: Self-pay | Admitting: Cardiology

## 2020-04-29 DIAGNOSIS — E7800 Pure hypercholesterolemia, unspecified: Secondary | ICD-10-CM

## 2020-05-02 ENCOUNTER — Ambulatory Visit (AMBULATORY_SURGERY_CENTER): Payer: BC Managed Care – PPO | Admitting: Internal Medicine

## 2020-05-02 ENCOUNTER — Other Ambulatory Visit: Payer: Self-pay

## 2020-05-02 ENCOUNTER — Encounter: Payer: Self-pay | Admitting: Internal Medicine

## 2020-05-02 VITALS — BP 185/88 | HR 79 | Temp 97.3°F | Resp 14 | Ht 62.0 in | Wt 215.0 lb

## 2020-05-02 DIAGNOSIS — Z1211 Encounter for screening for malignant neoplasm of colon: Secondary | ICD-10-CM | POA: Diagnosis not present

## 2020-05-02 DIAGNOSIS — R10812 Left upper quadrant abdominal tenderness: Secondary | ICD-10-CM

## 2020-05-02 DIAGNOSIS — K2951 Unspecified chronic gastritis with bleeding: Secondary | ICD-10-CM | POA: Diagnosis not present

## 2020-05-02 DIAGNOSIS — K2970 Gastritis, unspecified, without bleeding: Secondary | ICD-10-CM

## 2020-05-02 DIAGNOSIS — K319 Disease of stomach and duodenum, unspecified: Secondary | ICD-10-CM | POA: Diagnosis not present

## 2020-05-02 DIAGNOSIS — R112 Nausea with vomiting, unspecified: Secondary | ICD-10-CM

## 2020-05-02 HISTORY — PX: UPPER GASTROINTESTINAL ENDOSCOPY: SHX188

## 2020-05-02 HISTORY — PX: COLONOSCOPY: SHX174

## 2020-05-02 MED ORDER — SODIUM CHLORIDE 0.9 % IV SOLN: 500.0000 mL | Freq: Once | INTRAVENOUS | Status: DC

## 2020-05-02 NOTE — Patient Instructions (Addendum)
There is some stomach inflammation (gastritis). I took biopsies and will contact you with results and plans.  Colonoscopy was normal.  I appreciate the opportunity to care for you. Iva Boop, MD, [facility]   Handout given for gastritis.  Resume your Xarelto today.   YOU HAD AN ENDOSCOPIC PROCEDURE TODAY AT THE [facility]:   Refer to the procedure report that was given to you for any specific questions about what was found during the examination.  If the procedure report does not answer your questions, please call your gastroenterologist to clarify.  If you requested that your care partner not be given the details of your procedure findings, then the procedure report has been included in a sealed envelope for you to review at your convenience later.  YOU SHOULD EXPECT: Some feelings of bloating in the abdomen. Passage of more gas than usual.  Walking can help get rid of the air that was put into your GI tract during the procedure and reduce the bloating. If you had a lower endoscopy (such as a colonoscopy or flexible sigmoidoscopy) you may notice spotting of blood in your stool or on the toilet paper. If you underwent a bowel prep for your procedure, you may not have a normal bowel movement for a few days.  Please Note:  You might notice some irritation and congestion in your nose or some drainage.  This is from the oxygen used during your procedure.  There is no need for concern and it should clear up in a day or so.  SYMPTOMS TO REPORT IMMEDIATELY:   Following lower endoscopy (colonoscopy or flexible sigmoidoscopy):  Excessive amounts of blood in the stool  Significant tenderness or worsening of abdominal pains  Swelling of the abdomen that is new, acute  Fever of 100F or higher   Following upper endoscopy (EGD)  Vomiting of blood or coffee ground material  New chest pain or pain under the shoulder blades  Painful or persistently difficult swallowing  New shortness of  breath  Fever of 100F or higher  Black, tarry-looking stools  For urgent or emergent issues, a gastroenterologist can be reached at any hour by calling [phone]. Do not use MyChart messaging for urgent concerns.    DIET:  We do recommend a small meal at first, but then you may proceed to your regular diet.  Drink plenty of fluids but you should avoid alcoholic beverages for 24 hours.  ACTIVITY:  You should plan to take it easy for the rest of today and you should NOT DRIVE or use heavy machinery until tomorrow (because of the sedation medicines used during the test).    FOLLOW UP: Our staff will call the number listed on your records 48-72 hours following your procedure to check on you and address any questions or concerns that you may have regarding the information given to you following your procedure. If we do not reach you, we will leave a message.  We will attempt to reach you two times.  During this call, we will ask if you have developed any symptoms of COVID 19. If you develop any symptoms (ie: fever, flu-like symptoms, shortness of breath, cough etc.) before then, please call [phone].  If you test positive for Covid 19 in the 2 weeks post procedure, please call and report this information to [place].    If any biopsies were taken you will be contacted by phone or by letter within the next 1-3 weeks.  Please call us  at [phone] if you have not heard about the biopsies in 3 weeks.    SIGNATURES/CONFIDENTIALITY: You and/or your care partner have signed paperwork which will be entered into your electronic medical record.  These signatures attest to the fact that that the information above on your After Visit Summary has been reviewed and is understood.  Full responsibility of the confidentiality of this discharge information lies with you and/or your care-partner.

## 2020-05-02 NOTE — Op Note (Signed)
[facility] Patient Name: Dawn Braun Procedure Date: 05/02/2020 1:16 PM MRN: [id number] Endoscopist: Iva Boop , MD Age: 55 Referring MD:  Date of Birth: 23-Apr-1965 Gender: Female Account #: [id number] Procedure:                Upper GI endoscopy Indications:              Abdominal pain in the left upper quadrant, Nausea                            with vomiting Medicines:                Propofol per Anesthesia, Monitored Anesthesia Care Procedure:                Pre-Anesthesia Assessment:                           - Prior to the procedure, a History and Physical                            was performed, and patient medications and                            allergies were reviewed. The patient's tolerance of                            previous anesthesia was also reviewed. The risks                            and benefits of the procedure and the sedation                            options and risks were discussed with the patient.                            All questions were answered, and informed consent                            was obtained. Prior Anticoagulants: The patient                            last took Xarelto (rivaroxaban) 2 days prior to the                            procedure. ASA Grade Assessment: III - A patient                            with severe systemic disease. After reviewing the                            risks and benefits, the patient was deemed in                            satisfactory condition to undergo the procedure.  After obtaining informed consent, the endoscope was                            passed under direct vision. Throughout the                            procedure, the patient's blood pressure, pulse, and                            oxygen saturations were monitored continuously. The                            Endoscope was introduced through the mouth, and                            advanced  to the second part of duodenum. The upper                            GI endoscopy was accomplished without difficulty.                            The patient tolerated the procedure well. Scope In: Scope Out: Findings:                 Patchy mild inflammation characterized by erosions,                            erythema and friability was found in the gastric                            antrum. Biopsies were taken with a cold forceps for                            histology. Verification of patient identification                            for the specimen was done. Estimated blood loss was                            minimal.                           The exam was otherwise without abnormality.                           The cardia and gastric fundus were normal on                            retroflexion. Complications:            No immediate complications. Estimated Blood Loss:     Estimated blood loss was minimal. Impression:               - Gastritis. Biopsied.                           -  The examination was otherwise normal.                           Note that blood sugars have been normal during                            sypmtom episodes Recommendation:           - Patient has a contact number available for                            emergencies. The signs and symptoms of potential                            delayed complications were discussed with the                            patient. Return to normal activities tomorrow.                            Written discharge instructions were provided to the                            patient.                           - Resume previous diet.                           - Continue present medications.                           - Resume Xarelto (rivaroxaban) at prior dose today.                           - Await pathology results.                           - See the other procedure note for documentation of                             additional recommendations. Iva Boop, MD 05/02/2020 1:59:32 PM This report has been signed electronically.

## 2020-05-02 NOTE — Progress Notes (Signed)
Report to PACU, RN, vss, BBS= Clear.  

## 2020-05-02 NOTE — Progress Notes (Signed)
Called to room to assist during endoscopic procedure.  Patient ID and intended procedure confirmed with present staff. Received instructions for my participation in the procedure from the performing physician.  

## 2020-05-02 NOTE — Op Note (Signed)
[facility] Patient Name: Dawn Braun Procedure Date: 05/02/2020 1:16 PM MRN: [id number] Endoscopist: Iva Boop , MD Age: 55 Referring MD:  Date of Birth: 07-Nov-1965 Gender: Female Account #: [id number] Procedure:                Colonoscopy Indications:              Screening for colorectal malignant neoplasm, This                            is the patient's first colonoscopy Medicines:                Propofol per Anesthesia, Monitored Anesthesia Care Procedure:                Pre-Anesthesia Assessment:                           - Prior to the procedure, a History and Physical                            was performed, and patient medications and                            allergies were reviewed. The patient's tolerance of                            previous anesthesia was also reviewed. The risks                            and benefits of the procedure and the sedation                            options and risks were discussed with the patient.                            All questions were answered, and informed consent                            was obtained. Prior Anticoagulants: The patient                            last took Xarelto (rivaroxaban) 2 days prior to the                            procedure. ASA Grade Assessment: III - A patient                            with severe systemic disease. After reviewing the                            risks and benefits, the patient was deemed in                            satisfactory condition to undergo the procedure.  After obtaining informed consent, the colonoscope                            was passed under direct vision. Throughout the                            procedure, the patient's blood pressure, pulse, and                            oxygen saturations were monitored continuously. The                            Olympus CF-HQ190L (Serial# [id number]) Colonoscope was                             introduced through the anus and advanced to the the                            terminal ileum, with identification of the                            appendiceal orifice and IC valve. The terminal                            ileum, ileocecal valve, appendiceal orifice, and                            rectum were photographed. The quality of the bowel                            preparation was good. The colonoscopy was performed                            without difficulty. The patient tolerated the                            procedure well. The bowel preparation used was                            Miralax via split dose instruction. Scope In: 1:34:40 PM Scope Out: 1:51:47 PM Scope Withdrawal Time: 0 hours 7 minutes 56 seconds  Total Procedure Duration: 0 hours 17 minutes 7 seconds  Findings:                 The perianal and digital rectal examinations were                            normal.                           The colon (entire examined portion) appeared normal.                           No additional abnormalities were found on  retroflexion.                           The terminal ileum appeared normal. Complications:            No immediate complications. Estimated blood loss:                            None. Estimated Blood Loss:     Estimated blood loss: none. Impression:               - The entire examined colon is normal.                           - The examined portion of the ileum was normal.                           - No specimens collected. Recommendation:           - Repeat colonoscopy in 10 years for screening                            purposes.                           - Patient has a contact number available for                            emergencies. The signs and symptoms of potential                            delayed complications were discussed with the                            patient. Return to normal activities tomorrow.                             Written discharge instructions were provided to the                            patient.                           - Resume previous diet.                           - Continue present medications.                           - See the other procedure note for documentation of                            additional recommendations. Iva Boop, MD 05/02/2020 2:01:44 PM This report has been signed electronically.

## 2020-05-02 NOTE — Progress Notes (Signed)
Vital signs checked by:CW ? ?The medical and surgical history was reviewed and verified with the patient. ? ?

## 2020-05-06 ENCOUNTER — Telehealth: Payer: Self-pay

## 2020-05-06 NOTE — Telephone Encounter (Signed)
LVM

## 2020-05-09 DIAGNOSIS — E113492 Type 2 diabetes mellitus with severe nonproliferative diabetic retinopathy without macular edema, left eye: Secondary | ICD-10-CM | POA: Diagnosis not present

## 2020-05-09 DIAGNOSIS — H25811 Combined forms of age-related cataract, right eye: Secondary | ICD-10-CM | POA: Diagnosis not present

## 2020-05-12 ENCOUNTER — Emergency Department (HOSPITAL_COMMUNITY): Payer: BC Managed Care – PPO

## 2020-05-12 ENCOUNTER — Emergency Department (HOSPITAL_COMMUNITY)
Admission: EM | Admit: 2020-05-12 | Discharge: 2020-05-12 | Disposition: A | Discharge status: Home / Self Care | Payer: BC Managed Care – PPO | Attending: Emergency Medicine | Admitting: Emergency Medicine

## 2020-05-12 ENCOUNTER — Other Ambulatory Visit: Payer: Self-pay

## 2020-05-12 ENCOUNTER — Encounter (HOSPITAL_COMMUNITY): Payer: Self-pay | Admitting: Obstetrics and Gynecology

## 2020-05-12 DIAGNOSIS — J45909 Unspecified asthma, uncomplicated: Secondary | ICD-10-CM | POA: Diagnosis not present

## 2020-05-12 DIAGNOSIS — N1831 Chronic kidney disease, stage 3a: Secondary | ICD-10-CM | POA: Diagnosis not present

## 2020-05-12 DIAGNOSIS — R1012 Left upper quadrant pain: Secondary | ICD-10-CM | POA: Diagnosis not present

## 2020-05-12 DIAGNOSIS — R10817 Generalized abdominal tenderness: Secondary | ICD-10-CM | POA: Insufficient documentation

## 2020-05-12 DIAGNOSIS — E1121 Type 2 diabetes mellitus with diabetic nephropathy: Secondary | ICD-10-CM | POA: Diagnosis not present

## 2020-05-12 DIAGNOSIS — E1122 Type 2 diabetes mellitus with diabetic chronic kidney disease: Secondary | ICD-10-CM | POA: Insufficient documentation

## 2020-05-12 DIAGNOSIS — Z7901 Long term (current) use of anticoagulants: Secondary | ICD-10-CM | POA: Diagnosis not present

## 2020-05-12 DIAGNOSIS — K439 Ventral hernia without obstruction or gangrene: Secondary | ICD-10-CM | POA: Diagnosis not present

## 2020-05-12 DIAGNOSIS — Z794 Long term (current) use of insulin: Secondary | ICD-10-CM | POA: Insufficient documentation

## 2020-05-12 DIAGNOSIS — Z7984 Long term (current) use of oral hypoglycemic drugs: Secondary | ICD-10-CM | POA: Insufficient documentation

## 2020-05-12 DIAGNOSIS — R112 Nausea with vomiting, unspecified: Secondary | ICD-10-CM | POA: Diagnosis not present

## 2020-05-12 DIAGNOSIS — Z9049 Acquired absence of other specified parts of digestive tract: Secondary | ICD-10-CM | POA: Diagnosis not present

## 2020-05-12 DIAGNOSIS — R519 Headache, unspecified: Secondary | ICD-10-CM | POA: Diagnosis not present

## 2020-05-12 DIAGNOSIS — I700 Atherosclerosis of aorta: Secondary | ICD-10-CM | POA: Diagnosis not present

## 2020-05-12 DIAGNOSIS — Z79899 Other long term (current) drug therapy: Secondary | ICD-10-CM | POA: Insufficient documentation

## 2020-05-12 DIAGNOSIS — D259 Leiomyoma of uterus, unspecified: Secondary | ICD-10-CM | POA: Diagnosis not present

## 2020-05-12 DIAGNOSIS — I129 Hypertensive chronic kidney disease with stage 1 through stage 4 chronic kidney disease, or unspecified chronic kidney disease: Secondary | ICD-10-CM | POA: Diagnosis not present

## 2020-05-12 LAB — COMPREHENSIVE METABOLIC PANEL
ALT: 17 U/L (ref 0–44)
AST: 21 U/L (ref 15–41)
Albumin: 3.7 g/dL (ref 3.5–5.0)
Alkaline Phosphatase: 99 U/L (ref 38–126)
Anion gap: 12 (ref 5–15)
BUN: 36 mg/dL — ABNORMAL HIGH (ref 6–20)
CO2: 21 mmol/L — ABNORMAL LOW (ref 22–32)
Calcium: 9.5 mg/dL (ref 8.9–10.3)
Chloride: 107 mmol/L (ref 98–111)
Creatinine, Ser: 1.17 mg/dL — ABNORMAL HIGH (ref 0.44–1.00)
GFR, Estimated: 55 mL/min — ABNORMAL LOW (ref >60)
Glucose, Bld: 128 mg/dL — ABNORMAL HIGH (ref 70–99)
Potassium: 5 mmol/L (ref 3.5–5.1)
Sodium: 140 mmol/L (ref 135–145)
Total Bilirubin: 0.7 mg/dL (ref 0.3–1.2)
Total Protein: 7.2 g/dL (ref 6.5–8.1)

## 2020-05-12 LAB — CBC
HCT: 40.3 % (ref 36.0–46.0)
Hemoglobin: 12.6 g/dL (ref 12.0–15.0)
MCH: 27.8 pg (ref 26.0–34.0)
MCHC: 31.3 g/dL (ref 30.0–36.0)
MCV: 88.8 fL (ref 80.0–100.0)
Platelets: 339 10*3/uL (ref 150–400)
RBC: 4.54 MIL/uL (ref 3.87–5.11)
RDW: 13.5 % (ref 11.5–15.5)
WBC: 7.6 10*3/uL (ref 4.0–10.5)
nRBC: 0 % (ref 0.0–0.2)

## 2020-05-12 LAB — URINALYSIS, ROUTINE W REFLEX MICROSCOPIC
Bacteria, UA: NONE SEEN
Bilirubin Urine: NEGATIVE
Glucose, UA: NEGATIVE mg/dL
Hgb urine dipstick: NEGATIVE
Ketones, ur: NEGATIVE mg/dL
Leukocytes,Ua: NEGATIVE
Nitrite: NEGATIVE
Protein, ur: >=300 mg/dL — AB
Specific Gravity, Urine: 1.028 (ref 1.005–1.030)
pH: 6 (ref 5.0–8.0)

## 2020-05-12 LAB — LIPASE, BLOOD: Lipase: 29 U/L (ref 11–51)

## 2020-05-12 MED ORDER — SODIUM CHLORIDE 0.9 % IV BOLUS
1000.0000 mL | Freq: Once | INTRAVENOUS | Status: AC
  Administered 2020-05-12: 1000 mL via INTRAVENOUS

## 2020-05-12 MED ORDER — IOHEXOL 300 MG/ML  SOLN
100.0000 mL | Freq: Once | INTRAMUSCULAR | Status: AC | PRN
  Administered 2020-05-12: 100 mL via INTRAVENOUS

## 2020-05-12 MED ORDER — ONDANSETRON 4 MG PO TBDP: 4.0000 mg | ORAL_TABLET | Freq: Three times a day (TID) | ORAL | 0 refills | Status: DC | PRN

## 2020-05-12 MED ORDER — ACETAMINOPHEN 325 MG PO TABS
650.0000 mg | ORAL_TABLET | Freq: Once | ORAL | Status: DC
  Filled 2020-05-12: qty 2

## 2020-05-12 MED ORDER — ONDANSETRON 4 MG PO TBDP
4.0000 mg | ORAL_TABLET | Freq: Once | ORAL | Status: AC | PRN
  Administered 2020-05-12: 4 mg via ORAL

## 2020-05-12 MED ORDER — ONDANSETRON HCL 4 MG/2ML IJ SOLN
4.0000 mg | Freq: Once | INTRAMUSCULAR | Status: AC
  Administered 2020-05-12: 4 mg via INTRAVENOUS
  Filled 2020-05-12: qty 2

## 2020-05-12 NOTE — ED Provider Notes (Signed)
[facility] EMERGENCY DEPARTMENT Provider Note  CSN: [id number] Arrival date & time: 05/12/20 1655    History Chief Complaint  Patient presents with  . Nausea  . Emesis    HPI  Dawn Braun is a 55 y.o. female with history of CKD, DM and pAF on Xarelto who has had intermittent episodes of LUQ pain and occasional nausea/vomiting typically self limited to one episode that wakes her up around 3am. She had an EGD about 2 weeks ago that show mild gastritis. She reports today has had persistent vomiting since around 9am this morning. Episodes of vomiting are preceded by a severe diffuse headache that subsides when vomiting improved. She reports emesis has been liquid and orange. No blood. No constipation or diarrhea. She denies any abdominal pain. No prior problems with headaches. No change in vision.    Past Medical History:  Diagnosis Date  . Asthma   . Chronic kidney disease (CKD) stage G3a/A3, moderately decreased glomerular filtration rate (GFR) between 45-59 mL/min/1.73 square meter and albuminuria creatinine ratio greater than 300 mg/g (HCC)   . Complication of anesthesia    "takes me awhile to wakeup" (05/17/2017)  . Hypertension   . Intractable nausea and vomiting   . Microalbuminuria due to type 2 diabetes mellitus (HCC)   . Paroxysmal atrial fibrillation (HCC)   . Small bowel obstruction (HCC) 03/26/2019  . Type II diabetes mellitus (HCC)     Past Surgical History:  Procedure Laterality Date  . CESAREAN SECTION  2003  . CHOLECYSTECTOMY N/A 12/23/2016   Procedure: LAPAROSCOPIC CHOLECYSTECTOMY;  Surgeon: Kinsinger, De Blanch, MD;  Location: [facility];  Service: General;  Laterality: N/A;  . COLONOSCOPY  05/02/2020   CG, MD  . EYE SURGERY Right ~ 2016   "bleeding issues; lasered; cauterized leaks"  . KNEE ARTHROSCOPY Right X 2  . LAPAROTOMY N/A 03/26/2019   Procedure: EXPLORATORY LAPAROTOMY;  Surgeon: Darnell Level, MD;  Location: [facility];  Service: General;   Laterality: N/A;  . LYSIS OF ADHESION N/A 03/26/2019   Procedure: LYSIS OF ADHESION;  Surgeon: Darnell Level, MD;  Location: [facility];  Service: General;  Laterality: N/A;  . SPLIT NIGHT STUDY  08/06/2015  . TUBAL LIGATION  2003  . UPPER GASTROINTESTINAL ENDOSCOPY  05/02/2020   CG, MD    Family History  Problem Relation Age of Onset  . Epilepsy Father   . Diabetes Mother   . Kidney disease Mother   . Hypertension Mother   . Kidney disease Brother   . Diabetic kidney disease Brother   . Diabetes Brother   . Colon cancer Neg Hx   . Pancreatic cancer Neg Hx   . Esophageal cancer Neg Hx   . Rectal cancer Neg Hx   . Stomach cancer Neg Hx   . Liver cancer Neg Hx     Social History   Tobacco Use  . Smoking status: Never Smoker  . Smokeless tobacco: Never Used  Vaping Use  . Vaping Use: Never used  Substance Use Topics  . Alcohol use: Yes    Alcohol/week: 1.0 standard drink    Types: 1 Glasses of wine per week    Comment: 05/17/2017 "glass of wine q other weekend"  . Drug use: No     Home Medications Prior to Admission medications   Medication Sig Start Date End Date Taking? Authorizing Provider  ondansetron (ZOFRAN ODT) 4 MG disintegrating tablet Take 1 tablet (4 mg total) by mouth every 8 (eight) hours as  needed for nausea or vomiting. 05/12/20  Yes Pollyann Savoy, MD  atorvastatin (LIPITOR) 20 MG tablet Take 1 tablet by mouth once daily 04/30/20   Jodelle Red, MD  cetirizine (ZYRTEC) 10 MG tablet Take 1 tablet (10 mg total) by mouth daily. 08/03/19   Anders Simmonds, PA-C  diltiazem (CARDIZEM CD) 120 MG 24 hr capsule Take 1 capsule by mouth once daily 01/20/20   Fulp, Cammie, MD  famotidine (PEPCID) 20 MG tablet Take 1 tablet (20 mg total) by mouth 2 (two) times daily. AM and bedtime 03/11/20   Iva Boop, MD  flecainide (TAMBOCOR) 100 MG tablet Take 100 mg by mouth 2 (two) times daily.    [provider]  insulin isophane & regular human (NOVOLIN  70/30 FLEXPEN) (70-30) 100 UNIT/ML KwikPen Inject 9 Units into the skin 2 (two) times daily. 08/03/19 10/25/19  Anders Simmonds, PA-C  Insulin Pen Needle 31G X 4 MM MISC 1 Container by Does not apply route 2 (two) times daily. 08/03/19   Anders Simmonds, PA-C  lisinopril (ZESTRIL) 2.5 MG tablet Take 1 tablet by mouth once daily 01/23/20   Fulp, Cammie, MD  loratadine (CLARITIN) 10 MG tablet Take 10 mg by mouth daily. 02/17/20   [provider]  metFORMIN (GLUCOPHAGE) 500 MG tablet TAKE 2 TABLETS BY MOUTH IN THE MORNING WITH FOOD AND 1 TABLET IN THE EVENING WITH FOOD 01/15/20   Fulp, Cammie, MD  montelukast (SINGULAIR) 10 MG tablet Take 10 mg by mouth daily. 02/20/20   [provider]  XARELTO 20 MG TABS tablet TAKE 1 TABLET BY MOUTH ONCE DAILY WITH  SUPPER 02/06/20   Fulp, Hewitt Shorts, MD     Allergies    Patient has no known allergies.   Review of Systems   Review of Systems A comprehensive review of systems was completed and negative except as noted in HPI.    Physical Exam BP (!) 183/88   Pulse 80   Temp (!) 97.2 F (36.2 C) (Oral)   Resp 18   LMP 12/24/2015 (Exact Date)   SpO2 100%   Physical Exam Vitals and nursing note reviewed.  Constitutional:      Appearance: Normal appearance.  HENT:     Head: Normocephalic and atraumatic.     Nose: Nose normal.     Mouth/Throat:     Mouth: Mucous membranes are moist.  Eyes:     Extraocular Movements: Extraocular movements intact.     Conjunctiva/sclera: Conjunctivae normal.  Cardiovascular:     Rate and Rhythm: Normal rate.  Pulmonary:     Effort: Pulmonary effort is normal.     Breath sounds: Normal breath sounds.  Abdominal:     General: Abdomen is flat.     Palpations: Abdomen is soft.     Tenderness: There is abdominal tenderness (diffuse). There is no guarding.  Musculoskeletal:        General: No swelling. Normal range of motion.     Cervical back: Neck supple.  Skin:    General: Skin is warm and dry.   Neurological:     General: No focal deficit present.     Mental Status: She is alert.  Psychiatric:        Mood and Affect: Mood normal.      ED Results / Procedures / Treatments   Labs (all labs ordered are listed, but only abnormal results are displayed) Labs Reviewed  COMPREHENSIVE METABOLIC PANEL - Abnormal; Notable for the following components:  Result Value   CO2 21 (*)    Glucose, Bld 128 (*)    BUN 36 (*)    Creatinine, Ser 1.17 (*)    GFR, Estimated 55 (*)    All other components within normal limits  LIPASE, BLOOD  CBC  URINALYSIS, ROUTINE W REFLEX MICROSCOPIC    EKG None  Radiology CT Head Wo Contrast  Result Date: 05/12/2020 CLINICAL DATA:  Headache, intracranial hemorrhage suspected EXAM: CT HEAD WITHOUT CONTRAST TECHNIQUE: Contiguous axial images were obtained from the base of the skull through the vertex without intravenous contrast. COMPARISON:  None. FINDINGS: Brain: No evidence of acute territorial infarction, hemorrhage, hydrocephalus,extra-axial collection or mass lesion/mass effect. Normal gray-white differentiation. Ventricles are normal in size and contour. Vascular: No hyperdense vessel or unexpected calcification. Skull: The skull is intact. No fracture or focal lesion identified. Sinuses/Orbits: The visualized paranasal sinuses and mastoid air cells are clear. The orbits and globes intact. Other: None IMPRESSION: No acute intracranial abnormality. Electronically Signed   By: Jonna Clark M.D.   On: 05/12/2020 21:23   CT Abdomen Pelvis W Contrast  Result Date: 05/12/2020 CLINICAL DATA:  Bowel obstruction suspected EXAM: CT ABDOMEN AND PELVIS WITH CONTRAST TECHNIQUE: Multidetector CT imaging of the abdomen and pelvis was performed using the standard protocol following bolus administration of intravenous contrast. CONTRAST:  OMNIPAQUE IOHEXOL 300 MG/ML  SOLN COMPARISON:  03/25/2019 FINDINGS: Lower chest: Lung bases are clear. No effusions. Heart is  normal size. Hepatobiliary: No focal liver abnormality is seen. Status post cholecystectomy. No biliary dilatation. Pancreas: No focal abnormality or ductal dilatation. Spleen: No focal abnormality.  Normal size. Adrenals/Urinary Tract: No adrenal abnormality. No focal renal abnormality. No stones or hydronephrosis. Urinary bladder is unremarkable. Stomach/Bowel: Normal appendix. Stomach, large and small bowel grossly unremarkable. Vascular/Lymphatic: Scattered aortic atherosclerosis. No evidence of aneurysm or adenopathy. Reproductive: Calcified fibroids within the uterus. No adnexal mass. Other: No free fluid or free air. Small supraumbilical midline ventral hernia containing fat. Musculoskeletal: No acute bony abnormality. IMPRESSION: No evidence of bowel obstruction. Fibroid uterus. Scattered aortic atherosclerosis. Small supraumbilical ventral hernia containing fat. No acute findings. Electronically Signed   By: Charlett Nose M.D.   On: 05/12/2020 21:25    Procedures Procedures  Medications Ordered in the ED Medications  acetaminophen (TYLENOL) tablet 650 mg (has no administration in time range)  ondansetron (ZOFRAN-ODT) disintegrating tablet 4 mg (4 mg Oral Given 05/12/20 1714)  sodium chloride 0.9 % bolus 1,000 mL (1,000 mLs Intravenous New Bag/Given 05/12/20 2025)  ondansetron (ZOFRAN) injection 4 mg (4 mg Intravenous Given 05/12/20 2043)  iohexol (OMNIPAQUE) 300 MG/ML solution 100 mL (100 mLs Intravenous Contrast Given 05/12/20 2055)     MDM Rules/Calculators/A&P MDM Patient with persistent vomiting, denies pain but is diffusely tender. Will check CT given history of bowel obstruction requiring lysis of adhesions. She also is on blood thinners, will check CT head to rule out spontaneous ICH.   ED Course  I have reviewed the triage vital signs and the nursing notes.  Pertinent labs & imaging results that were available during my care of the patient were reviewed by me and considered in my  medical decision making (see chart for details).  Clinical Course as of 05/12/20 2224  Wynelle Link May 12, 2020  1933 CBC, CMP Lipase unremarkable.  [CS]  2215 Patient sleeping soundly. CT head and abd/pel are negative. She has not been vomiting here, would like to try PO trial prior to discharge.  [CS]  Clinical Course User Index [CS] Pollyann Savoy, MD    Final Clinical Impression(s) / ED Diagnoses Final diagnoses:  Non-intractable vomiting with nausea, unspecified vomiting type    Rx / DC Orders ED Discharge Orders         Ordered    ondansetron (ZOFRAN ODT) 4 MG disintegrating tablet  Every 8 hours PRN        05/12/20 2223           Pollyann Savoy, MD 05/12/20 2224

## 2020-05-12 NOTE — ED Triage Notes (Signed)
Patient reports to the ER for N/V with HTN and headache that started this morning. Patient denies chest pain, SOB, or diarrhea.

## 2020-05-12 NOTE — ED Notes (Signed)
Patient transported to CT 

## 2020-05-20 DIAGNOSIS — H2511 Age-related nuclear cataract, right eye: Secondary | ICD-10-CM | POA: Diagnosis not present

## 2020-05-20 DIAGNOSIS — H25811 Combined forms of age-related cataract, right eye: Secondary | ICD-10-CM | POA: Diagnosis not present

## 2020-06-03 DIAGNOSIS — H2512 Age-related nuclear cataract, left eye: Secondary | ICD-10-CM | POA: Diagnosis not present

## 2020-06-03 DIAGNOSIS — H25812 Combined forms of age-related cataract, left eye: Secondary | ICD-10-CM | POA: Diagnosis not present

## 2020-06-19 DIAGNOSIS — E113411 Type 2 diabetes mellitus with severe nonproliferative diabetic retinopathy with macular edema, right eye: Secondary | ICD-10-CM | POA: Diagnosis not present

## 2020-07-01 ENCOUNTER — Other Ambulatory Visit: Payer: Self-pay

## 2020-07-01 ENCOUNTER — Ambulatory Visit (HOSPITAL_COMMUNITY)
Admission: EM | Admit: 2020-07-01 | Discharge: 2020-07-01 | Disposition: A | Discharge status: Home / Self Care | Payer: BC Managed Care – PPO | Attending: Urgent Care | Admitting: Urgent Care

## 2020-07-01 ENCOUNTER — Ambulatory Visit: Payer: Self-pay | Admitting: *Deleted

## 2020-07-01 ENCOUNTER — Encounter (HOSPITAL_COMMUNITY): Payer: Self-pay

## 2020-07-01 DIAGNOSIS — M79605 Pain in left leg: Secondary | ICD-10-CM

## 2020-07-01 DIAGNOSIS — N183 Chronic kidney disease, stage 3 unspecified: Secondary | ICD-10-CM

## 2020-07-01 DIAGNOSIS — M79604 Pain in right leg: Secondary | ICD-10-CM | POA: Diagnosis not present

## 2020-07-01 DIAGNOSIS — I480 Paroxysmal atrial fibrillation: Secondary | ICD-10-CM | POA: Diagnosis not present

## 2020-07-01 DIAGNOSIS — M7989 Other specified soft tissue disorders: Secondary | ICD-10-CM | POA: Diagnosis not present

## 2020-07-01 DIAGNOSIS — E119 Type 2 diabetes mellitus without complications: Secondary | ICD-10-CM

## 2020-07-01 DIAGNOSIS — E1122 Type 2 diabetes mellitus with diabetic chronic kidney disease: Secondary | ICD-10-CM

## 2020-07-01 MED ORDER — FUROSEMIDE 40 MG PO TABS: 40.0000 mg | ORAL_TABLET | Freq: Every day | ORAL | 0 refills | Status: DC

## 2020-07-01 NOTE — ED Provider Notes (Signed)
[facility]   MRN: [id number] DOB: 04-27-1965  Subjective:   CEDRIC DENISON is a 55 y.o. female presenting for 6-day history of persistent bilateral lower leg swelling with pain.  Patient states that she has been taking "fluid "pills.  But per chart review, patient has only on lisinopril.  Denies any fever, chest pain, shortness of breath, calf warmth or redness.  Denies history of clotting disorder.  Patient does have paroxysmal atrial fibrillation and well-controlled type 2 diabetes, CKD stage III.  She is on Xarelto given her atrial fibrillation.  Admits to dietary noncompliance, states that she has been eating out a lot lately.  No history of heart failure.  No current facility-administered medications for this encounter.  Current Outpatient Medications:  .  atorvastatin (LIPITOR) 20 MG tablet, Take 1 tablet by mouth once daily, Disp: 90 tablet, Rfl: 0 .  cetirizine (ZYRTEC) 10 MG tablet, Take 1 tablet (10 mg total) by mouth daily., Disp: 30 tablet, Rfl: 11 .  diltiazem (CARDIZEM CD) 120 MG 24 hr capsule, Take 1 capsule by mouth once daily, Disp: 90 capsule, Rfl: 1 .  famotidine (PEPCID) 20 MG tablet, Take 1 tablet (20 mg total) by mouth 2 (two) times daily. AM and bedtime, Disp: 60 tablet, Rfl: 5 .  flecainide (TAMBOCOR) 100 MG tablet, Take 100 mg by mouth 2 (two) times daily., Disp: , Rfl:  .  insulin isophane & regular human (NOVOLIN 70/30 FLEXPEN) (70-30) 100 UNIT/ML KwikPen, Inject 9 Units into the skin 2 (two) times daily., Disp: 15 mL, Rfl: 3 .  Insulin Pen Needle 31G X 4 MM MISC, 1 Container by Does not apply route 2 (two) times daily., Disp: 100 each, Rfl: 0 .  lisinopril (ZESTRIL) 2.5 MG tablet, Take 1 tablet by mouth once daily, Disp: 90 tablet, Rfl: 1 .  loratadine (CLARITIN) 10 MG tablet, Take 10 mg by mouth daily., Disp: , Rfl:  .  metFORMIN (GLUCOPHAGE) 500 MG tablet, TAKE 2 TABLETS BY MOUTH IN THE MORNING WITH FOOD AND 1 TABLET IN THE EVENING WITH  FOOD, Disp: 270 tablet, Rfl: 1 .  montelukast (SINGULAIR) 10 MG tablet, Take 10 mg by mouth daily., Disp: , Rfl:  .  ondansetron (ZOFRAN ODT) 4 MG disintegrating tablet, Take 1 tablet (4 mg total) by mouth every 8 (eight) hours as needed for nausea or vomiting., Disp: 20 tablet, Rfl: 0 .  XARELTO 20 MG TABS tablet, TAKE 1 TABLET BY MOUTH ONCE DAILY WITH  SUPPER, Disp: 30 tablet, Rfl: 2   No Known Allergies  Past Medical History:  Diagnosis Date  . Asthma   . Chronic kidney disease (CKD) stage G3a/A3, moderately decreased glomerular filtration rate (GFR) between 45-59 mL/min/1.73 square meter and albuminuria creatinine ratio greater than 300 mg/g (HCC)   . Complication of anesthesia    "takes me awhile to wakeup" (05/17/2017)  . Hypertension   . Intractable nausea and vomiting   . Microalbuminuria due to type 2 diabetes mellitus (HCC)   . Paroxysmal atrial fibrillation (HCC)   . Small bowel obstruction (HCC) 03/26/2019  . Type II diabetes mellitus (HCC)      Past Surgical History:  Procedure Laterality Date  . CESAREAN SECTION  2003  . CHOLECYSTECTOMY N/A 12/23/2016   Procedure: LAPAROSCOPIC CHOLECYSTECTOMY;  Surgeon: Kinsinger, De Blanch, MD;  Location: [facility];  Service: General;  Laterality: N/A;  . COLONOSCOPY  05/02/2020   CG, MD  . EYE SURGERY Right ~ 2016   "bleeding  issues; lasered; cauterized leaks"  . KNEE ARTHROSCOPY Right X 2  . LAPAROTOMY N/A 03/26/2019   Procedure: EXPLORATORY LAPAROTOMY;  Surgeon: Darnell Level, MD;  Location: [facility];  Service: General;  Laterality: N/A;  . LYSIS OF ADHESION N/A 03/26/2019   Procedure: LYSIS OF ADHESION;  Surgeon: Darnell Level, MD;  Location: [facility];  Service: General;  Laterality: N/A;  . SPLIT NIGHT STUDY  08/06/2015  . TUBAL LIGATION  2003  . UPPER GASTROINTESTINAL ENDOSCOPY  05/02/2020   CG, MD    Family History  Problem Relation Age of Onset  . Epilepsy Father   . Diabetes Mother   . Kidney disease Mother   . Hypertension  Mother   . Kidney disease Brother   . Diabetic kidney disease Brother   . Diabetes Brother   . Colon cancer Neg Hx   . Pancreatic cancer Neg Hx   . Esophageal cancer Neg Hx   . Rectal cancer Neg Hx   . Stomach cancer Neg Hx   . Liver cancer Neg Hx     Social History   Tobacco Use  . Smoking status: Never Smoker  . Smokeless tobacco: Never Used  Vaping Use  . Vaping Use: Never used  Substance Use Topics  . Alcohol use: Yes    Alcohol/week: 1.0 standard drink    Types: 1 Glasses of wine per week    Comment: 05/17/2017 "glass of wine q other weekend"  . Drug use: No    ROS   Objective:   Vitals: BP (!) 172/93   Pulse 60   Temp 98.6 F (37 C)   Resp 19   LMP 12/24/2015 (Exact Date)   SpO2 99%   Physical Exam Constitutional:      General: She is not in acute distress.    Appearance: Normal appearance. She is well-developed. She is not ill-appearing, toxic-appearing or diaphoretic.  HENT:     Head: Normocephalic and atraumatic.     Nose: Nose normal.     Mouth/Throat:     Mouth: Mucous membranes are moist.  Eyes:     Extraocular Movements: Extraocular movements intact.     Pupils: Pupils are equal, round, and reactive to light.  Cardiovascular:     Rate and Rhythm: Normal rate and regular rhythm.     Pulses: Normal pulses.     Heart sounds: Normal heart sounds. No murmur heard. No friction rub. No gallop.   Pulmonary:     Effort: Pulmonary effort is normal. No respiratory distress.     Breath sounds: Normal breath sounds. No stridor. No wheezing, rhonchi or rales.  Musculoskeletal:        General: Swelling (1+ bilaterally with associated tenderness throughout due to taught skin) and tenderness present.     Right lower leg: No edema.     Left lower leg: No edema.  Skin:    General: Skin is warm and dry.     Findings: No rash.  Neurological:     Mental Status: She is alert and oriented to person, place, and time.  Psychiatric:        Mood and Affect: Mood  normal.        Behavior: Behavior normal.        Thought Content: Thought content normal.        Judgment: Judgment normal.      Assessment and Plan :   PDMP not reviewed this encounter.  1. Leg swelling   2. Bilateral leg pain  3. Paroxysmal atrial fibrillation (HCC)   4. Well controlled diabetes mellitus (HCC)     Kailei is a 55 year old female with past medical history of atrial fibrillation, hypertension, well-controlled diabetes presenting for 6-day history of persistent lower leg swelling.  Suspect that this is related to high sodium intake given her recent poor diet choices.  Patient has a clear cardiopulmonary, no chest pain or shortness of breath and therefore will manage for this with Lasix at 40 mg for 5 days.  Will avoid use of potassium supplementation given her paroxysmal atrial fibrillation.  Maintain lisinopril.  Counseled patient that this is medication used for blood pressure and is not a diuretic.  Emphasized need for follow-up with her PCP ASAP.  Unfortunately she is not able to get back and with them anytime soon, recommended that she present to our clinic when she completes her furosemide dosing for potassium level check.  Maintain strict ER precautions. Counseled patient on potential for adverse effects with medications prescribed today, patient verbalized understanding.    Wallis Bamberg, [place] 07/01/20 [id number]

## 2020-07-01 NOTE — ED Triage Notes (Signed)
Pt in with c/o right leg discomfort and swelling that has been going on since last Tuesday  Opt states she has been taking her fluid pills but the swelling is getting worse

## 2020-07-01 NOTE — Telephone Encounter (Signed)
Patient is calling to rpeort she has new onset leg and ankle swelling that is not getting better. Patient states at end of day it hurts to walk-and is painful to touch. Patient states decreases by 40% with elevation. Offered appointment in office today- patient is working and husband has the vehicle. Patient advised UC for evaluation- she states she will go and follow up in office after that.  Reason for Disposition . [1] MODERATE leg swelling (e.g., swelling extends up to knees) AND [2] new-onset or worsening  Answer Assessment - Initial Assessment Questions 1. ONSET: "When did the swelling start?" (e.g., minutes, hours, days)     Last Tuesday-after work, Friday swelling remained 2. LOCATION: "What part of the leg is swollen?"  "Are both legs swollen or just one leg?"     R leg- knee down, L leg- ankle swelling 3. SEVERITY: "How bad is the swelling?" (e.g., localized; mild, moderate, severe)  - Localized - small area of swelling localized to one leg  - MILD pedal edema - swelling limited to foot and ankle, pitting edema < 1/4 inch (6 mm) deep, rest and elevation eliminate most or all swelling  - MODERATE edema - swelling of lower leg to knee, pitting edema > 1/4 inch (6 mm) deep, rest and elevation only partially reduce swelling  - SEVERE edema - swelling extends above knee, facial or hand swelling present      moderate 4. REDNESS: "Does the swelling look red or infected?"     No redness 5. PAIN: "Is the swelling painful to touch?" If Yes, ask: "How painful is it?"   (Scale 1-10; mild, moderate or severe)     End of day- painful, tender to touch, rated today- 3- at end of day- 8 6. FEVER: "Do you have a fever?" If Yes, ask: "What is it, how was it measured, and when did it start?"      nofever 7. CAUSE: "What do you think is causing the leg swelling?"     unsure 8. MEDICAL HISTORY: "Do you have a history of heart failure, kidney disease, liver failure, or cancer?"     Afib 9. RECURRENT  SYMPTOM: "Have you had leg swelling before?" If Yes, ask: "When was the last time?" "What happened that time?"     Never like this 10. OTHER SYMPTOMS: "Do you have any other symptoms?" (e.g., chest pain, difficulty breathing)       no 11. PREGNANCY: "Is there any chance you are pregnant?" "When was your last menstrual period?"       n/a  Protocols used: LEG SWELLING AND EDEMA-A-AH

## 2020-07-01 NOTE — Discharge Instructions (Addendum)
We will be using furosemide to help you with the swelling in your lower legs. This is a medication that can lower your potassium. Potassium is an important electrolyte in your body. Changing its level either too high or too low is very risky for your heart rhythm. For now, we will avoid using potassium supplement but please follow my diet guidelines below. When you are finished with your furosemide dosing in 5 days come back for bloodwork to check your potassium level since you cannot see your PCP that soon.   For diabetes or elevated blood sugar, please make sure you are limiting and avoiding starchy, carbohydrate foods like pasta, breads, sweet breads, pastry, rice, potatoes, desserts. These foods can elevate your blood sugar. Also, limit and avoid drinks that contain a lot of sugar such as sodas, sweet teas, fruit juices.  Drinking plain water will be much more helpful, try 64 ounces of water daily.  It is okay to flavor your water naturally by cutting cucumber, lemon, mint or lime, placing it in a picture with water and drinking it over a period of 24-48 hours as long as it remains refrigerated.  For elevated blood pressure, make sure you are monitoring salt in your diet.  Do not eat restaurant foods and limit processed foods at home. I highly recommend you prepare and cook your own foods at home.  Processed foods include things like frozen meals, pre-seasoned meats and dinners, deli meats, canned foods as these foods contain a high amount of sodium/salt.  Make sure you are paying attention to sodium labels on foods you buy at the grocery store. Buy your spices separately such as garlic powder, onion powder, cumin, cayenne, parsley flakes so that you can avoid seasonings that contain salt. However, salt-free seasonings are available and can be used, an example is Mrs. Dash and includes a lot of different mixtures that do not contain salt.  Lastly, when cooking using oils that are healthier for you is  important. This includes olive oil, avocado oil, canola oil. We have discussed a lot of foods to avoid but below is a list of foods that can be very healthy to use in your diet whether it is for diabetes, cholesterol, high blood pressure, or in general healthy eating.  Salads - kale, spinach, cabbage, spring mix, arugula Fruits - avocadoes, berries (blueberries, raspberries, blackberries), apples, oranges, pomegranate, grapefruit, kiwi Vegetables - asparagus, cauliflower, broccoli, green beans, brussel sprouts, bell peppers, beets; stay away from or limit starchy vegetables like potatoes, carrots, peas Other general foods - kidney beans, egg whites, almonds, walnuts, sunflower seeds, pumpkin seeds, fat free yogurt, almond milk, flax seeds, quinoa, oats  Meat - It is better to eat lean meats and limit your red meat including pork to once a week.  Wild caught fish, chicken breast are good options as they tend to be leaner sources of good protein. Still be mindful of the sodium labels for the meats you buy.  DO NOT EAT ANY FOODS ON THIS LIST THAT YOU ARE ALLERGIC TO. For more specific needs, I highly recommend consulting a dietician or nutritionist but this can definitely be a good starting point.

## 2020-07-22 DIAGNOSIS — H4312 Vitreous hemorrhage, left eye: Secondary | ICD-10-CM | POA: Diagnosis not present

## 2020-07-24 DIAGNOSIS — E113512 Type 2 diabetes mellitus with proliferative diabetic retinopathy with macular edema, left eye: Secondary | ICD-10-CM | POA: Diagnosis not present

## 2020-07-24 DIAGNOSIS — E113411 Type 2 diabetes mellitus with severe nonproliferative diabetic retinopathy with macular edema, right eye: Secondary | ICD-10-CM | POA: Diagnosis not present

## 2020-08-09 ENCOUNTER — Ambulatory Visit: Payer: BC Managed Care – PPO | Admitting: Cardiology

## 2020-08-14 NOTE — Progress Notes (Signed)
Cardiology Clinic Note   Patient Name: Dawn Braun Date of Encounter: 08/16/2020  Primary Care Provider:  Patient, No Pcp Per (Inactive) Primary Cardiologist:  Jodelle Red, MD  Patient Profile    Dawn Braun 55 year old female presents the clinic today for follow-up evaluation of her paroxysmal atrial fibrillation and essential hypertension.  Past Medical History    Past Medical History:  Diagnosis Date  . Asthma   . Chronic kidney disease (CKD) stage G3a/A3, moderately decreased glomerular filtration rate (GFR) between 45-59 mL/min/1.73 square meter and albuminuria creatinine ratio greater than 300 mg/g (HCC)   . Complication of anesthesia    "takes me awhile to wakeup" (05/17/2017)  . Hypertension   . Intractable nausea and vomiting   . Microalbuminuria due to type 2 diabetes mellitus (HCC)   . Paroxysmal atrial fibrillation (HCC)   . Small bowel obstruction (HCC) 03/26/2019  . Type II diabetes mellitus (HCC)    Past Surgical History:  Procedure Laterality Date  . CESAREAN SECTION  2003  . CHOLECYSTECTOMY N/A 12/23/2016   Procedure: LAPAROSCOPIC CHOLECYSTECTOMY;  Surgeon: Kinsinger, De Blanch, MD;  Location: [facility];  Service: General;  Laterality: N/A;  . COLONOSCOPY  05/02/2020   CG, MD  . EYE SURGERY Right ~ 2016   "bleeding issues; lasered; cauterized leaks"  . KNEE ARTHROSCOPY Right X 2  . LAPAROTOMY N/A 03/26/2019   Procedure: EXPLORATORY LAPAROTOMY;  Surgeon: Darnell Level, MD;  Location: [facility];  Service: General;  Laterality: N/A;  . LYSIS OF ADHESION N/A 03/26/2019   Procedure: LYSIS OF ADHESION;  Surgeon: Darnell Level, MD;  Location: [facility];  Service: General;  Laterality: N/A;  . SPLIT NIGHT STUDY  08/06/2015  . TUBAL LIGATION  2003  . UPPER GASTROINTESTINAL ENDOSCOPY  05/02/2020   CG, MD    Allergies  No Known Allergies  History of Present Illness    Ms. Baumgarner has a PMH of paroxysmal atrial fibrillation, essential  hypertension, asthma, diabetes mellitus type 2, DKA, CKD stage III, obesity, hypokalemia, and hypercholesterolemia.  She has a prior surgical history of SBO.  She was initially seen by Dr. Cristal Deer 1/21 as a consult for management of her PAF.  She was seen by Dr. Cristal Deer on 01/30/2020.  During that time she reported fatigue that been ongoing for several months.  She reported she has been sleeping well but was waking up fatigued.  She had a normal sleep study and her mood was stable.  She reported right lateral tingling of her leg with sitting.  Her symptoms improved with changing positions.  She reported not having symptoms related to her atrial fibrillation.  She was managed on flecainide, diltiazem, and rivaroxaban.  Her atrial fibrillation was followed by the atrial fibrillation clinic.  She denied chest pain, shortness of breath, PND, orthopnea, lower extremity swelling, unexpected weight gain, palpitations, and syncopal events.  She presents the clinic today for follow-up evaluation states she continues to notice daytime fatigue.  She reports that her daughter and husband feel she is snoring more.  She reports daytime somnolence as well.  She has noticed some mild lower extremity edema.  We reviewed eating low-salt diet, support stockings, and elevating lower extremities.  We reviewed her renal function and previous labs from February.  I will order a sleep study, order BMP, cholesterol panel, LFTs, give her the salty 6 diet sheet, [place] support stocking sheet, have her increase her physical activity as tolerated, and follow-up with Dr. Cristal Deer in 6 months.  Today she denies chest pain, shortness of breath, lower extremity edema, fatigue, palpitations, melena, hematuria, hemoptysis, diaphoresis, weakness, presyncope, syncope, orthopnea, and PND.   Home Medications    Prior to Admission medications   Medication Sig Start Date End Date Taking? Authorizing Provider  atorvastatin  (LIPITOR) 20 MG tablet Take 1 tablet by mouth once daily 04/30/20   Jodelle Red, MD  cetirizine (ZYRTEC) 10 MG tablet Take 1 tablet (10 mg total) by mouth daily. 08/03/19   Anders Simmonds, PA-C  diltiazem (CARDIZEM CD) 120 MG 24 hr capsule Take 1 capsule by mouth once daily 01/20/20   Fulp, Cammie, MD  famotidine (PEPCID) 20 MG tablet Take 1 tablet (20 mg total) by mouth 2 (two) times daily. AM and bedtime 03/11/20   Iva Boop, MD  flecainide (TAMBOCOR) 100 MG tablet Take 100 mg by mouth 2 (two) times daily.    [provider]  furosemide (LASIX) 40 MG tablet Take 1 tablet (40 mg total) by mouth daily. 07/01/20   Wallis Bamberg, PA-C  insulin isophane & regular human (NOVOLIN 70/30 FLEXPEN) (70-30) 100 UNIT/ML KwikPen Inject 9 Units into the skin 2 (two) times daily. 08/03/19 10/25/19  Anders Simmonds, PA-C  Insulin Pen Needle 31G X 4 MM MISC 1 Container by Does not apply route 2 (two) times daily. 08/03/19   Anders Simmonds, PA-C  lisinopril (ZESTRIL) 2.5 MG tablet Take 1 tablet by mouth once daily 01/23/20   Fulp, Cammie, MD  loratadine (CLARITIN) 10 MG tablet Take 10 mg by mouth daily. 02/17/20   [provider]  metFORMIN (GLUCOPHAGE) 500 MG tablet TAKE 2 TABLETS BY MOUTH IN THE MORNING WITH FOOD AND 1 TABLET IN THE EVENING WITH FOOD 01/15/20   Fulp, Cammie, MD  montelukast (SINGULAIR) 10 MG tablet Take 10 mg by mouth daily. 02/20/20   [provider]  ondansetron (ZOFRAN ODT) 4 MG disintegrating tablet Take 1 tablet (4 mg total) by mouth every 8 (eight) hours as needed for nausea or vomiting. 05/12/20   Pollyann Savoy, MD  XARELTO 20 MG TABS tablet TAKE 1 TABLET BY MOUTH ONCE DAILY WITH  SUPPER 02/06/20   Cain Saupe, MD    Family History    Family History  Problem Relation Age of Onset  . Epilepsy Father   . Diabetes Mother   . Kidney disease Mother   . Hypertension Mother   . Kidney disease Brother   . Diabetic kidney disease Brother   .  Diabetes Brother   . Colon cancer Neg Hx   . Pancreatic cancer Neg Hx   . Esophageal cancer Neg Hx   . Rectal cancer Neg Hx   . Stomach cancer Neg Hx   . Liver cancer Neg Hx    She indicated that her mother is deceased. She indicated that her father is deceased. She indicated that her brother is deceased. She indicated that the status of her neg hx is unknown.  Social History    Social History   Socioeconomic History  . Marital status: Single    Spouse name: Not on file  . Number of children: 2  . Years of education: Not on file  . Highest education level: Not on file  Occupational History  . Occupation: Radio broadcast assistant  Tobacco Use  . Smoking status: Never Smoker  . Smokeless tobacco: Never Used  Vaping Use  . Vaping Use: Never used  Substance and Sexual Activity  . Alcohol use: Yes  Alcohol/week: 1.0 standard drink    Types: 1 Glasses of wine per week    Comment: 05/17/2017 "glass of wine q other weekend"  . Drug use: No  . Sexual activity: Yes    Birth control/protection: Surgical  Other Topics Concern  . Not on file  Social History Narrative   Patient is single she has 1 son and 1 daughter   Came to [place] for [employer] and remained after school   Employed as a Teacher, English as a foreign languagelogistics coordinator   Never smoker   1 alcoholic beverage weekly   2 caffeinated beverages daily   No drugs or tobacco   Social Determinants of Corporate investment bankerHealth   Financial Resource Strain: Not on file  Food Insecurity: Not on file  Transportation Needs: Not on file  Physical Activity: Not on file  Stress: Not on file  Social Connections: Not on file  Intimate Partner Violence: Not on file     Review of Systems    General:  No chills, fever, night sweats or weight changes.  Cardiovascular:  No chest pain, dyspnea on exertion, edema, orthopnea, palpitations, paroxysmal nocturnal dyspnea. Dermatological: No rash, lesions/masses Respiratory: No cough, dyspnea Urologic: No hematuria,  dysuria Abdominal:   No nausea, vomiting, diarrhea, bright red blood per rectum, melena, or hematemesis Neurologic:  No visual changes, wkns, changes in mental status. All other systems reviewed and are otherwise negative except as noted above.  Physical Exam    VS:  BP 124/68 (BP Location: Left Arm, Patient Position: Sitting, Cuff Size: Large)   Pulse 69   Ht 5' 2.5" (1.588 m)   Wt 223 lb (101.2 kg)   LMP 12/24/2015 (Exact Date)   SpO2 98%   BMI 40.14 kg/m  , BMI Body mass index is 40.14 kg/m. GEN: Well nourished, well developed, in no acute distress. HEENT: normal. Neck: Supple, no JVD, carotid bruits, or masses. Cardiac: RRR, no murmurs, rubs, or gallops. No clubbing, cyanosis, edema.  Radials/DP/PT 2+ and equal bilaterally.  Respiratory:  Respirations regular and unlabored, clear to auscultation bilaterally. GI: Soft, nontender, nondistended, BS + x 4. MS: no deformity or atrophy. Skin: warm and dry, no rash. Neuro:  Strength and sensation are intact. Psych: Normal affect.  Accessory Clinical Findings    Recent Labs: 12/13/2019: TSH 1.100 05/12/2020: ALT 17; BUN 36; Creatinine, Ser 1.17; Hemoglobin 12.6; Platelets 339; Potassium 5.0; Sodium 140   Recent Lipid Panel    Component Value Date/Time   CHOL 133 08/03/2019 0947   TRIG 54 08/03/2019 0947   HDL 59 08/03/2019 0947   CHOLHDL 2.3 08/03/2019 0947   LDLCALC 62 08/03/2019 0947    ECG personally reviewed by me today-none today.  ETT 01/31/2016  The patient walked for 7 minutes of a Bruce protocol GXT.  She acieved a peak HR of 150 which is 89% predicted maximal HR  There were no ST or T wave changes to suggest ischemia.  There was minimal widening of the QRS at peak exercise. There were no arrhythmias at peak exercise  Negative GXT   Echocardiogram 12/16/2017 Study Conclusions   - Left ventricle: The cavity size was normal. Wall thickness was  increased in a pattern of moderate LVH. Systolic function was   vigorous. The estimated ejection fraction was in the range of 65%  to 70%. Wall motion was normal; there were no regional wall  motion abnormalities. Features are consistent with a pseudonormal  left ventricular filling pattern, with concomitant abnormal  relaxation and increased filling pressure (grade  2 diastolic  dysfunction).  - Mitral valve: There was mild regurgitation.  - Left atrium: The atrium was mildly dilated.   Assessment & Plan   1.  Snoring/Day time somulence- reports her daughter and husband have noticed she is snoring more.  She wakes up with fatigue and notices daytime somnolence. Order sleep study  Paroxysmal atrial fibrillation- heart rate today 69.  No recent episodes of increased heart rate or irregular heartbeats.  Reports she is cardiac aware. Continue flecainide, diltiazem, Xarelto Heart healthy low-sodium diet-salty 6 given Increase physical activity as tolerated Follows with [facility]. fib clinic  Essential hypertension-BP today 124/68.  Well-controlled at home. Continue diltiazem, furosemide, lisinopril Heart healthy low-sodium diet-salty 6 given Increase physical activity as tolerated Maintain blood pressure log  Hypercholesterolemia-LDL 62 on 08/03/2019 Continue atorvastatin Heart healthy low-sodium high-fiber diet Increase physical activity as tolerated Repeat fasting lipids and LFTs  Type 2 diabetes- A1c 6.6 on 12/13/2019 Continue atorvastatin, no aspirin on Xarelto Continue weight loss Heart healthy low-sodium carb modified diet Increase physical activity as tolerated  Disposition: Follow-up with Dr. Cristal Deer in 6 months.   Thomasene Ripple. Litisha Guagliardo NP-C    08/16/2020, 9:10 AM [facility] [address] Office [phone] Fax [phone]  Notice: This dictation was prepared with Dragon dictation along with smaller phrase technology. Any transcriptional errors that result from this process are  unintentional and may not be corrected upon review.  I spent 15 minutes examining this patient, reviewing medications, and using patient centered shared decision making involving her cardiac care.  Prior to her visit I spent greater than 20 minutes reviewing her past medical history,  medications, and prior cardiac tests.

## 2020-08-16 ENCOUNTER — Encounter: Payer: Self-pay | Admitting: General Practice

## 2020-08-16 ENCOUNTER — Other Ambulatory Visit: Payer: Self-pay

## 2020-08-16 ENCOUNTER — Ambulatory Visit (INDEPENDENT_AMBULATORY_CARE_PROVIDER_SITE_OTHER): Payer: BC Managed Care – PPO | Admitting: General Practice

## 2020-08-16 VITALS — BP 124/68 | HR 69 | Ht 62.5 in | Wt 223.0 lb

## 2020-08-16 DIAGNOSIS — Z79899 Other long term (current) drug therapy: Secondary | ICD-10-CM

## 2020-08-16 DIAGNOSIS — E7800 Pure hypercholesterolemia, unspecified: Secondary | ICD-10-CM | POA: Diagnosis not present

## 2020-08-16 DIAGNOSIS — E0800 Diabetes mellitus due to underlying condition with hyperosmolarity without nonketotic hyperglycemic-hyperosmolar coma (NKHHC): Secondary | ICD-10-CM

## 2020-08-16 DIAGNOSIS — I10 Essential (primary) hypertension: Secondary | ICD-10-CM | POA: Diagnosis not present

## 2020-08-16 DIAGNOSIS — I480 Paroxysmal atrial fibrillation: Secondary | ICD-10-CM | POA: Diagnosis not present

## 2020-08-16 DIAGNOSIS — R400 Somnolence: Secondary | ICD-10-CM

## 2020-08-16 DIAGNOSIS — R5383 Other fatigue: Secondary | ICD-10-CM

## 2020-08-16 LAB — BASIC METABOLIC PANEL
BUN/Creatinine Ratio: 24 — ABNORMAL HIGH (ref 9–23)
BUN: 31 mg/dL — ABNORMAL HIGH (ref 6–24)
CO2: 20 mmol/L (ref 20–29)
Calcium: 9.3 mg/dL (ref 8.7–10.2)
Chloride: 109 mmol/L — ABNORMAL HIGH (ref 96–106)
Creatinine, Ser: 1.28 mg/dL — ABNORMAL HIGH (ref 0.57–1.00)
Glucose: 103 mg/dL — ABNORMAL HIGH (ref 65–99)
Potassium: 5.2 mmol/L (ref 3.5–5.2)
Sodium: 143 mmol/L (ref 134–144)
eGFR: 49 mL/min/{1.73_m2} — ABNORMAL LOW (ref >59)

## 2020-08-16 LAB — LIPID PANEL
Chol/HDL Ratio: 2.5 ratio (ref 0.0–4.4)
Cholesterol, Total: 148 mg/dL (ref 100–199)
HDL: 59 mg/dL (ref >39)
LDL Chol Calc (NIH): 75 mg/dL (ref 0–99)
Triglycerides: 69 mg/dL (ref 0–149)
VLDL Cholesterol Cal: 14 mg/dL (ref 5–40)

## 2020-08-16 LAB — HEPATIC FUNCTION PANEL
ALT: 14 IU/L (ref 0–32)
AST: 15 IU/L (ref 0–40)
Albumin: 3.6 g/dL — ABNORMAL LOW (ref 3.8–4.9)
Alkaline Phosphatase: 120 IU/L (ref 44–121)
Bilirubin Total: <0.2 mg/dL (ref 0.0–1.2)
Bilirubin, Direct: <0.1 mg/dL (ref 0.00–0.40)
Total Protein: 6.3 g/dL (ref 6.0–8.5)

## 2020-08-16 NOTE — Patient Instructions (Signed)
Medication Instructions:  The current medical regimen is effective;  continue present plan and medications as directed. Please refer to the Current Medication list given to you today. *If you need a refill on your cardiac medications before your next appointment, please call your pharmacy*  Lab Work: FASTING LI[PID,BMET AND LFT WHEN FASTING If you have labs (blood work) drawn today and your tests are completely normal, you will receive your results only by:  MyChart Message (if you have MyChart) OR A paper copy in the mail.  If you have any lab test that is abnormal or we need to change your treatment, we will call you to review the results. You may go to any [facility] that is convenient for you however, we do have a lab in our office that is able to assist you. You DO NOT need an appointment for our lab. The lab is open 8:00am and closes at 4:00pm. Lunch 12:45 - 1:45pm.  Testing/Procedures: Your physician has recommended that you have a sleep study. This test records several body functions during sleep, including: brain activity, eye movement, oxygen and carbon dioxide blood levels, heart rate and rhythm, breathing rate and rhythm, the flow of air through your mouth and nose, snoring, body muscle movements, and chest and belly movement.  Special Instructions PLEASE MAKE SURE TO ELEVATE YOUR FEET & LEGS WHILE SITTING, THIS WILL HELP WITH THE SWELLING ALSO.   PLEASE READ AND FOLLOW SALTY 6-ATTACHED-1,800 mg daily  PLEASE INCREASE PHYSICAL ACTIVITY AS TOLERATED  PLEASE PURCHASE AND WEAR COMPRESSION STOCKINGS DAILY AND TAKE OFF AT BEDTIME. Compression stockings are elastic socks that squeeze the legs. They help to increase blood flow to the legs and to decrease swelling in the legs from fluid retention, and reduce the chance of developing blood clots in the lower legs. Please put on in the AM when dressing and off at night when dressing for bed.  LET THEM KNOW THAT YOU NEED KNEE HIGH'S WITH COMPRESSION  OF 15-20 mmhg.  [employer]  [address] ([phone]); [place], [place] [address]; [phone]  EMAIL   [email or web address].  Follow-Up: Your next appointment:  6 month(s) In Person with Dawn Red, MD OR IF UNAVAILABLE Dawn CLEAVER, FNP-C   Please call our office 2 months in advance to schedule this appointment   At [facility], you and your health needs are our priority.  As part of our continuing mission to provide you with exceptional heart care, we have created designated Provider Care Teams.  These Care Teams include your primary Cardiologist (physician) and Advanced Practice Providers (APPs -  Physician Assistants and Nurse Practitioners) who all work together to provide you with the care you need, when you need it.

## 2020-08-23 DIAGNOSIS — E113512 Type 2 diabetes mellitus with proliferative diabetic retinopathy with macular edema, left eye: Secondary | ICD-10-CM | POA: Diagnosis not present

## 2020-08-24 ENCOUNTER — Other Ambulatory Visit (HOSPITAL_COMMUNITY): Payer: Self-pay | Admitting: Nurse Practitioner

## 2020-09-18 ENCOUNTER — Other Ambulatory Visit: Payer: Self-pay

## 2020-09-18 DIAGNOSIS — IMO0002 Reserved for concepts with insufficient information to code with codable children: Secondary | ICD-10-CM

## 2020-09-18 NOTE — Telephone Encounter (Signed)
Medication: metFORMIN (GLUCOPHAGE) 500 MG tablet [[id number]] , lisinopril (ZESTRIL) 2.5 MG tablet [[id number]]   Has the patient contacted their pharmacy? YES (Agent: If no, request that the patient contact the pharmacy for the refill.) (Agent: If yes, when and what did the pharmacy advise?)  Preferred Pharmacy (with phone number or street name): [facility] [address], [place] - [address] [place] [address] Phone: [phone] Fax: [phone] Hours: Not open 24 hours    Agent: Please be advised that RX refills may take up to 3 business days. We ask that you follow-up with your pharmacy.

## 2020-09-18 NOTE — Telephone Encounter (Signed)
Future visit scheduled:no  Notes to clinic: review for courtesy supply Patient is overdue for 6 month follow up Message has been sent for patient to contact office    Requested Prescriptions  Pending Prescriptions Disp Refills   metFORMIN (GLUCOPHAGE) 500 MG tablet 270 tablet 1    Sig: TAKE 2 TABLETS BY MOUTH IN THE MORNING WITH FOOD AND 1 TABLET IN THE EVENING WITH FOOD      Endocrinology:  Diabetes - Biguanides Failed - 09/18/2020 10:29 AM      Failed - Cr in normal range and within 360 days    Creatinine, Ser  Date Value Ref Range Status  08/16/2020 1.28 (H) 0.57 - 1.00 mg/dL Final   Creatinine, Urine  Date Value Ref Range Status  12/24/2016 278.42 mg/dL Final    Comment:    Performed at [facility], [place] [address] [place], [place] [address]  12/24/2016 277.32 mg/dL Final          Failed - HBA1C is between 0 and 7.9 and within 180 days    Hgb A1c MFr Bld  Date Value Ref Range Status  12/13/2019 6.6 (H) 4.8 - 5.6 % Final    Comment:             Prediabetes: 5.7 - 6.4          Diabetes: >6.4          Glycemic control for adults with diabetes: <7.0           Failed - AA eGFR in normal range and within 360 days    GFR calc Af Amer  Date Value Ref Range Status  12/29/2019 57 (L) >59 mL/min/1.73 Final    Comment:    **[facility] currently reports eGFR in compliance with the current**   recommendations of the [employer]. [facility] will   update reporting as new guidelines are published from the NKF-ASN   Task force.    GFR, Estimated  Date Value Ref Range Status  05/12/2020 55 (L) >60 mL/min Final    Comment:    (NOTE) Calculated using the CKD-EPI Creatinine Equation (2021)    eGFR  Date Value Ref Range Status  08/16/2020 49 (L) >59 mL/min/1.73 Final          Failed - Valid encounter within last 6 months    Recent Outpatient Visits           9 months ago Type 2 diabetes mellitus with stage 3a chronic kidney disease, with long-term  current use of insulin ([place])   [place] Fulp, [place], MD   1 year ago Diabetes mellitus type 2, uncontrolled, with complications [facility])   [place], [place], [place]   1 year ago Diabetes mellitus type 2, uncontrolled, with complications [facility])   [place], Jarome Matin, RPH-CPP   1 year ago Diabetes mellitus type 2, uncontrolled, with complications ([place])   [place] Fulp, [place], MD                  lisinopril (ZESTRIL) 2.5 MG tablet 90 tablet 1    Sig: Take 1 tablet (2.5 mg total) by mouth daily.      Cardiovascular:  ACE Inhibitors Failed - 09/18/2020 10:29 AM      Failed - Cr in normal range and within 180 days    Creatinine, Ser  Date Value Ref Range  Status  08/16/2020 1.28 (H) 0.57 - 1.00 mg/dL Final   Creatinine, Urine  Date Value Ref Range Status  12/24/2016 278.42 mg/dL Final    Comment:    Performed at [facility], [place] [address], [place], [place] [address]  12/24/2016 277.32 mg/dL Final          Failed - Valid encounter within last 6 months    Recent Outpatient Visits           9 months ago Type 2 diabetes mellitus with stage 3a chronic kidney disease, with long-term current use of insulin ([place])   [place] Fulp, [place], MD   1 year ago Diabetes mellitus type 2, uncontrolled, with complications [facility])   [place], [place], [place]   1 year ago Diabetes mellitus type 2, uncontrolled, with complications [facility])   [place], Jarome Matin, RPH-CPP   1 year ago Diabetes mellitus type 2, uncontrolled, with complications [facility])   [place], [place], MD                Passed - K in normal range and within 180 days    Potassium  Date Value Ref Range Status  08/16/2020  5.2 3.5 - 5.2 mmol/L Final          Passed - Patient is not pregnant      Passed - Last BP in normal range    BP Readings from Last 1 Encounters:  08/16/20 124/68

## 2020-09-27 DIAGNOSIS — E113512 Type 2 diabetes mellitus with proliferative diabetic retinopathy with macular edema, left eye: Secondary | ICD-10-CM | POA: Diagnosis not present

## 2020-09-27 DIAGNOSIS — E113411 Type 2 diabetes mellitus with severe nonproliferative diabetic retinopathy with macular edema, right eye: Secondary | ICD-10-CM | POA: Diagnosis not present

## 2020-10-25 ENCOUNTER — Emergency Department (HOSPITAL_COMMUNITY)
Admission: EM | Admit: 2020-10-25 | Discharge: 2020-10-25 | Disposition: A | Discharge status: Home / Self Care | Payer: BC Managed Care – PPO | Attending: Emergency Medicine | Admitting: Emergency Medicine

## 2020-10-25 ENCOUNTER — Encounter (HOSPITAL_COMMUNITY): Payer: Self-pay

## 2020-10-25 ENCOUNTER — Emergency Department (HOSPITAL_COMMUNITY): Payer: BC Managed Care – PPO

## 2020-10-25 ENCOUNTER — Other Ambulatory Visit: Payer: Self-pay

## 2020-10-25 DIAGNOSIS — Z79899 Other long term (current) drug therapy: Secondary | ICD-10-CM | POA: Insufficient documentation

## 2020-10-25 DIAGNOSIS — I480 Paroxysmal atrial fibrillation: Secondary | ICD-10-CM | POA: Insufficient documentation

## 2020-10-25 DIAGNOSIS — Z7984 Long term (current) use of oral hypoglycemic drugs: Secondary | ICD-10-CM | POA: Diagnosis not present

## 2020-10-25 DIAGNOSIS — M791 Myalgia, unspecified site: Secondary | ICD-10-CM | POA: Insufficient documentation

## 2020-10-25 DIAGNOSIS — N1831 Chronic kidney disease, stage 3a: Secondary | ICD-10-CM | POA: Insufficient documentation

## 2020-10-25 DIAGNOSIS — I129 Hypertensive chronic kidney disease with stage 1 through stage 4 chronic kidney disease, or unspecified chronic kidney disease: Secondary | ICD-10-CM | POA: Diagnosis not present

## 2020-10-25 DIAGNOSIS — I160 Hypertensive urgency: Secondary | ICD-10-CM | POA: Diagnosis not present

## 2020-10-25 DIAGNOSIS — Z7901 Long term (current) use of anticoagulants: Secondary | ICD-10-CM | POA: Insufficient documentation

## 2020-10-25 DIAGNOSIS — E1122 Type 2 diabetes mellitus with diabetic chronic kidney disease: Secondary | ICD-10-CM | POA: Diagnosis not present

## 2020-10-25 DIAGNOSIS — R519 Headache, unspecified: Secondary | ICD-10-CM

## 2020-10-25 DIAGNOSIS — Z794 Long term (current) use of insulin: Secondary | ICD-10-CM | POA: Diagnosis not present

## 2020-10-25 LAB — COMPREHENSIVE METABOLIC PANEL
ALT: 14 U/L (ref 0–44)
AST: 16 U/L (ref 15–41)
Albumin: 3.1 g/dL — ABNORMAL LOW (ref 3.5–5.0)
Alkaline Phosphatase: 101 U/L (ref 38–126)
Anion gap: 9 (ref 5–15)
BUN: 31 mg/dL — ABNORMAL HIGH (ref 6–20)
CO2: 22 mmol/L (ref 22–32)
Calcium: 9.3 mg/dL (ref 8.9–10.3)
Chloride: 110 mmol/L (ref 98–111)
Creatinine, Ser: 1.05 mg/dL — ABNORMAL HIGH (ref 0.44–1.00)
GFR, Estimated: >60 mL/min (ref >60)
Glucose, Bld: 233 mg/dL — ABNORMAL HIGH (ref 70–99)
Potassium: 5.2 mmol/L — ABNORMAL HIGH (ref 3.5–5.1)
Sodium: 141 mmol/L (ref 135–145)
Total Bilirubin: 0.4 mg/dL (ref 0.3–1.2)
Total Protein: 6.5 g/dL (ref 6.5–8.1)

## 2020-10-25 LAB — CBC
HCT: 38 % (ref 36.0–46.0)
Hemoglobin: 12.1 g/dL (ref 12.0–15.0)
MCH: 28 pg (ref 26.0–34.0)
MCHC: 31.8 g/dL (ref 30.0–36.0)
MCV: 88 fL (ref 80.0–100.0)
Platelets: 288 10*3/uL (ref 150–400)
RBC: 4.32 MIL/uL (ref 3.87–5.11)
RDW: 13.2 % (ref 11.5–15.5)
WBC: 6.8 10*3/uL (ref 4.0–10.5)
nRBC: 0 % (ref 0.0–0.2)

## 2020-10-25 LAB — LIPASE, BLOOD: Lipase: 24 U/L (ref 11–51)

## 2020-10-25 MED ORDER — SODIUM ZIRCONIUM CYCLOSILICATE 10 G PO PACK
10.0000 g | PACK | Freq: Once | ORAL | Status: AC
  Administered 2020-10-25: 10 g via ORAL
  Filled 2020-10-25: qty 1

## 2020-10-25 NOTE — ED Provider Notes (Signed)
Emergency Medicine Provider Triage Evaluation Note  Dawn Braun , a 55 y.o. female  was evaluated in triage.  Pt complains of headache.  Review of Systems  Positive: Headache, body cramps/spasm, nausea Negative: Fever, cough  Physical Exam  BP (!) 206/98 Comment: Patient did not take her BP med today.  Pulse 75   Temp 98.1 F (36.7 C) (Oral)   Resp 16   Ht 5' 2.5" (1.588 m)   Wt 97.5 kg   LMP 12/24/2015 (Exact Date)   SpO2 100%   BMI 38.70 kg/m  Gen:   Awake, no distress   Resp:  Normal effort  MSK:   Moves extremities without difficulty  Other:    Medical Decision Making  Medically screening exam initiated at 9:43 AM.  Appropriate orders placed.  [name] was informed that the remainder of the evaluation will be completed by another provider, this initial triage assessment does not replace that evaluation, and the importance of remaining in the ED until their evaluation is complete.  Patient endorsed recurrent headache for the past 2 weeks worsening since this morning.  Also endorsed having some body aches as well and muscle cramps.  Please note blood pressure is elevated at 206/98 however patient overall well-appearing without any complaints of focal neurodeficit   Fayrene Helper, PA-C 10/25/20 0946    Terrilee Files, MD 10/25/20 [phone]

## 2020-10-25 NOTE — Discharge Instructions (Addendum)
Your headache may be due to your elevated blood pressure.  Please discuss this with your primary care doctor for better management.  High blood pressure.  Your potassium level is high today.  We have given you medication to help reduce the potassium level however it will need to be rechecked in the next several days.  Please consider holding off taking your Lipitor medication for the next week or 2 and follow-up with your doctor as muscle aches and pain may sometimes be due to certain medication.  Return to the ER if you have any concern.

## 2020-10-25 NOTE — ED Triage Notes (Signed)
Patient c/o headache, vomiting, and headache that started this AM. Patient states she has not felt good in a month. Patient also reports intermittent muscle cramps x 1 month as well.  BP in triage-203/98. Patient did not take her Lisinopril 5 mg today.

## 2020-10-25 NOTE — ED Provider Notes (Addendum)
[facility]-EMERGENCY DEPT Provider Note   CSN: [id number] Arrival date & time: 10/25/20  0740     History Chief Complaint  Patient presents with   Hypertension   Headache    Dawn Braun is a 55 y.o. female.  The history is provided by the patient and medical records. No language interpreter was used.  Hypertension Associated symptoms include headaches.  Headache  55 year old female significant history of chronic kidney disease, hypertension, paroxysmal atrial fibrillation, diabetes who presents complaining of headache.  Patient endorsed having recurrent headache ongoing for the past 2 weeks but seems to be worse this morning.  Also endorsed having some muscle aches and muscle cramps for the same duration.  She does check her blood pressure at home has noticed that her blood pressure is high which concerns her.  She denies any recent medication changes.  No report of any fever chills runny nose sneezing coughing sore throat denies any focal numbness or focal weakness.  She has been compliant with her medication.  She also takes her cholesterol medication, Lipitor.  She has been vaccinated for COVID-19.  Past Medical History:  Diagnosis Date   Asthma    Chronic kidney disease (CKD) stage G3a/A3, moderately decreased glomerular filtration rate (GFR) between 45-59 mL/min/1.73 square meter and albuminuria creatinine ratio greater than 300 mg/g (HCC)    Complication of anesthesia    "takes me awhile to wakeup" (05/17/2017)   Hypertension    Intractable nausea and vomiting    Microalbuminuria due to type 2 diabetes mellitus (HCC)    Paroxysmal atrial fibrillation (HCC)    Small bowel obstruction (HCC) 03/26/2019   Type II diabetes mellitus (HCC)     Patient Active Problem List   Diagnosis Date Noted   Secondary hypercoagulable state (HCC) 04/01/2020   Hypercholesterolemia 04/01/2020   Essential hypertension 04/01/2020   Chronic kidney disease (CKD)  stage G3a/A3, moderately decreased glomerular filtration rate (GFR) between 45-59 mL/min/1.73 square meter and albuminuria creatinine ratio greater than 300 mg/g (HCC)    Women's annual routine gynecological examination 03/04/2020   CKD (chronic kidney disease) stage 3, GFR 30-59 ml/min (HCC) 03/28/2019   Obesity (BMI 30-39.9) 03/28/2019   Chronic anticoagulation 03/28/2019   Closed loop SBO (small bowel obstruction) s/p exlap /lysis of adhesions 03/26/2019 03/25/2019   Acute renal failure superimposed on stage 3 chronic kidney disease (HCC) 09/19/2018   DKA (diabetic ketoacidoses) 09/18/2018   Leukocytosis 05/17/2017   Hypokalemia 01/02/2017   SIRS (systemic inflammatory response syndrome) (HCC) 12/31/2016   Hemoperitoneum 12/31/2016   Intractable nausea and vomiting 12/31/2016   AKI (acute kidney injury) (HCC) 12/31/2016   Paroxysmal atrial fibrillation (HCC)    Asthma    Diabetes mellitus type 2 in obese (HCC) 12/23/2016   Menopausal symptom 05/01/2016    Past Surgical History:  Procedure Laterality Date   CESAREAN SECTION  2003   CHOLECYSTECTOMY N/A 12/23/2016   Procedure: LAPAROSCOPIC CHOLECYSTECTOMY;  Surgeon: Rodman Pickle, MD;  Location: [facility];  Service: General;  Laterality: N/A;   COLONOSCOPY  05/02/2020   CG, MD   EYE SURGERY Right ~ 2016   "bleeding issues; lasered; cauterized leaks"   KNEE ARTHROSCOPY Right X 2   LAPAROTOMY N/A 03/26/2019   Procedure: EXPLORATORY LAPAROTOMY;  Surgeon: Darnell Level, MD;  Location: [facility];  Service: General;  Laterality: N/A;   LYSIS OF ADHESION N/A 03/26/2019   Procedure: LYSIS OF ADHESION;  Surgeon: Darnell Level, MD;  Location: [facility];  Service: General;  Laterality: N/A;   SPLIT NIGHT STUDY  08/06/2015   TUBAL LIGATION  2003   UPPER GASTROINTESTINAL ENDOSCOPY  05/02/2020   CG, MD     OB History     Gravida      Para      Term      Preterm      AB      Living  2      SAB      IAB      Ectopic       Multiple      Live Births              Family History  Problem Relation Age of Onset   Epilepsy Father    Diabetes Mother    Kidney disease Mother    Hypertension Mother    Kidney disease Brother    Diabetic kidney disease Brother    Diabetes Brother    Colon cancer Neg Hx    Pancreatic cancer Neg Hx    Esophageal cancer Neg Hx    Rectal cancer Neg Hx    Stomach cancer Neg Hx    Liver cancer Neg Hx     Social History   Tobacco Use   Smoking status: Never   Smokeless tobacco: Never  Vaping Use   Vaping Use: Never used  Substance Use Topics   Alcohol use: Yes    Alcohol/week: 1.0 standard drink    Types: 1 Glasses of wine per week    Comment: 05/17/2017 "glass of wine q other weekend"   Drug use: No    Home Medications Prior to Admission medications   Medication Sig Start Date End Date Taking? Authorizing Provider  atorvastatin (LIPITOR) 20 MG tablet Take 1 tablet by mouth once daily 04/30/20   Jodelle Redhristopher, Bridgette, MD  diltiazem (CARDIZEM CD) 120 MG 24 hr capsule Take 1 capsule by mouth once daily 01/20/20   Fulp, Cammie, MD  famotidine (PEPCID) 20 MG tablet Take 1 tablet (20 mg total) by mouth 2 (two) times daily. AM and bedtime 03/11/20   Iva BoopGessner, Carl E, MD  flecainide [facility](TAMBOCOR) 100 MG tablet Take 1 tablet by mouth twice daily 08/26/20   Ronney Astersleaver, Jesse M, NP  furosemide (LASIX) 40 MG tablet Take 1 tablet (40 mg total) by mouth daily. 07/01/20   Wallis BambergMani, Mario, PA-C  insulin isophane & regular human (NOVOLIN 70/30 FLEXPEN) (70-30) 100 UNIT/ML KwikPen Inject 9 Units into the skin 2 (two) times daily. 08/03/19 10/25/19  Anders SimmondsMcClung, Angela M, PA-C  Insulin Pen Needle 31G X 4 MM MISC 1 Container by Does not apply route 2 (two) times daily. 08/03/19   Anders SimmondsMcClung, Angela M, PA-C  lisinopril (ZESTRIL) 2.5 MG tablet Take 1 tablet by mouth once daily 01/23/20   Fulp, Cammie, MD  loratadine (CLARITIN) 10 MG tablet Take 10 mg by mouth daily. 02/17/20   [provider]  metFORMIN  (GLUCOPHAGE) 500 MG tablet TAKE 2 TABLETS BY MOUTH IN THE MORNING WITH FOOD AND 1 TABLET IN THE EVENING WITH FOOD 01/15/20   Fulp, Cammie, MD  montelukast (SINGULAIR) 10 MG tablet Take 10 mg by mouth daily. 02/20/20   [provider]  ondansetron (ZOFRAN ODT) 4 MG disintegrating tablet Take 1 tablet (4 mg total) by mouth every 8 (eight) hours as needed for nausea or vomiting. 05/12/20   Pollyann SavoySheldon, Charles B, MD  XARELTO 20 MG TABS tablet TAKE 1 TABLET BY MOUTH ONCE DAILY WITH  SUPPER 02/06/20   Fulp, Cammie,  MD    Allergies    Patient has no known allergies.  Review of Systems   Review of Systems  Neurological:  Positive for headaches.  All other systems reviewed and are negative.  Physical Exam Updated Vital Signs BP (!) 183/97   Pulse 64   Temp 98.1 F (36.7 C) (Oral)   Resp 16   Ht 5' 2.5" (1.588 m)   Wt 97.5 kg   LMP 12/24/2015 (Exact Date)   SpO2 95%   BMI 38.70 kg/m   Physical Exam Vitals and nursing note reviewed.  Constitutional:      General: She is not in acute distress.    Appearance: She is well-developed.  HENT:     Head: Normocephalic and atraumatic.  Eyes:     Conjunctiva/sclera: Conjunctivae normal.  Pulmonary:     Effort: Pulmonary effort is normal.  Musculoskeletal:        General: No swelling.     Cervical back: Normal range of motion and neck supple.  Skin:    General: Skin is warm.     Findings: No rash.  Neurological:     Mental Status: She is alert and oriented to person, place, and time.     GCS: GCS eye subscore is 4. GCS verbal subscore is 5. GCS motor subscore is 6.     Cranial Nerves: No cranial nerve deficit or dysarthria.     Sensory: No sensory deficit.     Motor: No weakness.  Psychiatric:        Mood and Affect: Mood normal.    ED Results / Procedures / Treatments   Labs (all labs ordered are listed, but only abnormal results are displayed) Labs Reviewed  COMPREHENSIVE METABOLIC PANEL - Abnormal; Notable for the following  components:      Result Value   Potassium 5.2 (*)    Glucose, Bld 233 (*)    BUN 31 (*)    Creatinine, Ser 1.05 (*)    Albumin 3.1 (*)    All other components within normal limits  LIPASE, BLOOD  CBC  URINALYSIS, ROUTINE W REFLEX MICROSCOPIC    EKG None  Radiology CT Head Wo Contrast  Result Date: 10/25/2020 CLINICAL DATA:  Headache, new or worsening EXAM: CT HEAD WITHOUT CONTRAST TECHNIQUE: Contiguous axial images were obtained from the base of the skull through the vertex without intravenous contrast. COMPARISON:  05/12/2020 FINDINGS: Brain: No evidence of acute infarction, hemorrhage, hydrocephalus, extra-axial collection or mass lesion/mass effect. Vascular: No hyperdense vessel or unexpected calcification. Skull: Hyperostosis interna and dural calcification considered incidental. Sinuses/Orbits: Negative for acute finding. Bilateral cataract resection. IMPRESSION: No acute or interval finding. Electronically Signed   By: Marnee Spring M.D.   On: 10/25/2020 10:44    Procedures Procedures   Medications Ordered in ED Medications  sodium zirconium cyclosilicate (LOKELMA) packet 10 g (has no administration in time range)    ED Course  I have reviewed the triage vital signs and the nursing notes.  Pertinent labs & imaging results that were available during my care of the patient were reviewed by me and considered in my medical decision making (see chart for details).    MDM Rules/Calculators/A&P                           BP (!) 170/106   Pulse 79   Temp 98.1 F (36.7 C) (Oral)   Resp 16   Ht 5' 2.5" (1.588 m)  Wt 97.5 kg   LMP 12/24/2015 (Exact Date)   SpO2 100%   BMI 38.70 kg/m   Final Clinical Impression(s) / ED Diagnoses Final diagnoses:  Recurrent headache  Myalgia  Hypertensive urgency    Rx / DC Orders ED Discharge Orders     None      1:32 PM Patient endorsed recurrent headache which has been ongoing for several weeks worse today.  Her  primary concern is elevated blood pressure during these headaches.  She reported being compliant with her blood pressure medication.  Initially her systolic blood pressure was 205 which has improved to 183 without any specific treatment.  Head CT scan obtained showed no acute finding.  Labs remarkable for hyperkalemia with a potassium of 5.2.  She has had elevated potassium in the past.We will give [facility] and instruct patient to have her potassium level rechecked.  Patient is mentating appropriately appears to be in no acute discomfort.  I have low suspicion for meningitis/she is without any fever or nuchal rigidity, low suspicion for subarachnoid hemorrhage as this is not an acute thunderclap type of headache and I have low suspicion for stroke or space-occupying lesion.  Instruct patient to follow-up closely with PCP for further managements of her elevated blood pressure.  Her body aches and pain may be due to statin induced drugs.  I encourage patient to hold off from taking Lipitor for the next 2 weeks and follow-up with her doctor for recheck.  Return precaution given.   Fayrene Helper, PA-C 10/25/20 1340    Fayrene Helper, PA-C 10/25/20 1431    Terrilee Files, MD 10/25/20 [phone]

## 2020-10-28 ENCOUNTER — Ambulatory Visit: Payer: Self-pay | Admitting: *Deleted

## 2020-10-28 NOTE — Telephone Encounter (Signed)
Please schedule patient an appointment.  

## 2020-10-28 NOTE — Telephone Encounter (Signed)
Rerouting to [facility].

## 2020-10-28 NOTE — Telephone Encounter (Signed)
B/P high on Friday-systolic over 200 along with severe HA. Was seen at the ED and told to follow-up with pcp.Today, B/P 176/101 but no symptoms at this time. Taking Lisinopril as ordered. Instructed care advice to begin drinking a lot of water today and with any SOB/CP/dizziness/weakness/vision changes or HA to go the ED again. She is at work now-requesting to be seen tomorrow for evaluation of B/P and medications.Routing to clinic for possible appointment.    Reason for Disposition  Systolic BP  >= 180 OR Diastolic >= 110  Answer Assessment - Initial Assessment Questions 1. BLOOD PRESSURE: "What is the blood pressure?" "Did you take at least two measurements 5 minutes apart?"     Friday, systolic was over 200. This morning 176/101 at 5:00a today. Unable to retake. 2. ONSET: "When did you take your blood pressure?"     0500 3. HOW: "How did you obtain the blood pressure?" (e.g., visiting nurse, automatic home BP monitor)     Home monitor 4. HISTORY: "Do you have a history of high blood pressure?"     yes 5. MEDICATIONS: "Are you taking any medications for blood pressure?" "Have you missed any doses recently?"     Lisinopril Qd 6. OTHER SYMPTOMS: "Do you have any symptoms?" (e.g., headache, chest pain, blurred vision, difficulty breathing, weakness)     none 7. PREGNANCY: "Is there any chance you are pregnant?" "When was your last menstrual period?"     na  Protocols used: Blood Pressure - High-A-AH

## 2020-10-31 ENCOUNTER — Ambulatory Visit (INDEPENDENT_AMBULATORY_CARE_PROVIDER_SITE_OTHER): Payer: BC Managed Care – PPO | Admitting: Physician Assistant

## 2020-10-31 ENCOUNTER — Encounter: Payer: Self-pay | Admitting: Physician Assistant

## 2020-10-31 ENCOUNTER — Other Ambulatory Visit: Payer: Self-pay

## 2020-10-31 VITALS — BP 163/87 | HR 63 | Temp 98.2°F | Resp 18 | Ht 62.0 in | Wt 222.0 lb

## 2020-10-31 DIAGNOSIS — I10 Essential (primary) hypertension: Secondary | ICD-10-CM

## 2020-10-31 DIAGNOSIS — E669 Obesity, unspecified: Secondary | ICD-10-CM | POA: Diagnosis not present

## 2020-10-31 DIAGNOSIS — R601 Generalized edema: Secondary | ICD-10-CM | POA: Diagnosis not present

## 2020-10-31 DIAGNOSIS — E1169 Type 2 diabetes mellitus with other specified complication: Secondary | ICD-10-CM | POA: Diagnosis not present

## 2020-10-31 DIAGNOSIS — G479 Sleep disorder, unspecified: Secondary | ICD-10-CM | POA: Diagnosis not present

## 2020-10-31 LAB — POCT GLYCOSYLATED HEMOGLOBIN (HGB A1C): Hemoglobin A1C: 7.9 % — AB (ref 4.0–5.6)

## 2020-10-31 MED ORDER — FUROSEMIDE 20 MG PO TABS: 20.0000 mg | ORAL_TABLET | Freq: Every day | ORAL | 0 refills | Status: DC | PRN

## 2020-10-31 MED ORDER — LISINOPRIL 10 MG PO TABS: 10.0000 mg | ORAL_TABLET | Freq: Every day | ORAL | 1 refills | Status: DC

## 2020-10-31 NOTE — Patient Instructions (Addendum)
You will start taking 10 mg of lisinopril once daily.  Please continue to check your blood pressure at home, keep a written log and have available for all office visits.  You can also start taking Lasix 20 mg once a day as needed.  I strongly encourage you to increase your water intake to at least 64 ounces a day.  I strongly encourage you to call cardiology for prompt follow-up, you will follow-up with the mobile unit in 2 weeks.  We will call you with today's lab results.  Roney Jaffe, PA-C Physician Assistant [facility] [email or web address]   Low-Sodium Eating Plan Sodium, which is an element that makes up salt, helps you maintain a healthy balance of fluids in your body. Too much sodium can increase your bloodpressure and cause fluid and waste to be held in your body. Your health care provider or dietitian may recommend following this plan if you have high blood pressure (hypertension), kidney disease, liver disease, or heart failure. Eating less sodium can help lower your blood pressure, reduce swelling, and protect your heart, liver, andkidneys. What are tips for following this plan? Reading food labels The Nutrition Facts label lists the amount of sodium in one serving of the food. If you eat more than one serving, you must multiply the listed amount of sodium by the number of servings. Choose foods with less than 140 mg of sodium per serving. Avoid foods with 300 mg of sodium or more per serving. Shopping  Look for lower-sodium products, often labeled as "low-sodium" or "no salt added." Always check the sodium content, even if foods are labeled as "unsalted" or "no salt added." Buy fresh foods. Avoid canned foods and pre-made or frozen meals. Avoid canned, cured, or processed meats. Buy breads that have less than 80 mg of sodium per slice.  Cooking  Eat more home-cooked food and less restaurant, buffet, and fast  food. Avoid adding salt when cooking. Use salt-free seasonings or herbs instead of table salt or sea salt. Check with your health care provider or pharmacist before using salt substitutes. Cook with plant-based oils, such as canola, sunflower, or olive oil.  Meal planning When eating at a restaurant, ask that your food be prepared with less salt or no salt, if possible. Avoid dishes labeled as brined, pickled, cured, smoked, or made with soy sauce, miso, or teriyaki sauce. Avoid foods that contain MSG (monosodium glutamate). MSG is sometimes added to [place] food, bouillon, and some canned foods. Make meals that can be grilled, baked, poached, roasted, or steamed. These are generally made with less sodium. General information Most people on this plan should limit their sodium intake to 1,500-2,000 mg (milligrams) of sodium each day. What foods should I eat? Fruits Fresh, frozen, or canned fruit. Fruit juice. Vegetables Fresh or frozen vegetables. "No salt added" canned vegetables. "No salt added"tomato sauce and paste. Low-sodium or reduced-sodium tomato and vegetable juice. Grains Low-sodium cereals, including oats, puffed wheat and rice, and shredded wheat. Low-sodium crackers. Unsalted rice. Unsalted pasta. Low-sodium bread.Whole-grain breads and whole-grain pasta. Meats and other proteins Fresh or frozen (no salt added) meat, poultry, seafood, and fish. Low-sodium canned tuna and salmon. Unsalted nuts. Dried peas, beans, and lentils withoutadded salt. Unsalted canned beans. Eggs. Unsalted nut butters. Dairy Milk. Soy milk. Cheese that is naturally low in sodium, such as ricotta cheese, fresh mozzarella, or Swiss cheese. Low-sodium or reduced-sodium cheese. Creamcheese. Yogurt. Seasonings and condiments Fresh and dried herbs and spices. Salt-free seasonings. Low-sodium  mustard and ketchup. Sodium-free salad dressing. Sodium-free light mayonnaise. Fresh orrefrigerated horseradish. Lemon  juice. Vinegar. Other foods Homemade, reduced-sodium, or low-sodium soups. Unsalted popcorn and pretzels.Low-salt or salt-free chips. The items listed above may not be a complete list of foods and beverages you can eat. Contact a dietitian for more information. What foods should I avoid? Vegetables Sauerkraut, pickled vegetables, and relishes. Olives. [place] fries. Onion rings. Regular canned vegetables (not low-sodium or reduced-sodium). Regular canned tomato sauce and paste (not low-sodium or reduced-sodium). Regular tomato and vegetable juice (not low-sodium or reduced-sodium). Frozenvegetables in sauces. Grains Instant hot cereals. Bread stuffing, pancake, and biscuit mixes. Croutons. Seasoned rice or pasta mixes. Noodle soup cups. Boxed or frozen macaroni andcheese. Regular salted crackers. Self-rising flour. Meats and other proteins Meat or fish that is salted, canned, smoked, spiced, or pickled. Precooked or cured meat, such as sausages or meat loaves. Tomasa Blase. Ham. Pepperoni. Hot dogs. Corned beef. Chipped beef. Salt pork. Jerky. Pickled herring. Anchovies andsardines. Regular canned tuna. Salted nuts. Dairy Processed cheese and cheese spreads. Hard cheeses. Cheese curds. Blue cheese.Feta cheese. String cheese. Regular cottage cheese. Buttermilk. Canned milk. Fats and oils Salted butter. Regular margarine. Ghee. Bacon fat. Seasonings and condiments Onion salt, garlic salt, seasoned salt, table salt, and sea salt. Canned and packaged gravies. Worcestershire sauce. Tartar sauce. Barbecue sauce. Teriyaki sauce. Soy sauce, including reduced-sodium. Steak sauce. Fish sauce. Oyster sauce. Cocktail sauce. Horseradish that you find on the shelf. Regular ketchup and mustard. Meat flavorings and tenderizers. Bouillon cubes. Hot sauce. Pre-made or packaged marinades. Pre-made or packaged taco seasonings. Relishes.Regular salad dressings. Salsa. Other foods Salted popcorn and pretzels. Corn chips and  puffs. Potato and tortilla chips.Canned or dried soups. Pizza. Frozen entrees and pot pies. The items listed above may not be a complete list of foods and beverages you should avoid. Contact a dietitian for more information. Summary Eating less sodium can help lower your blood pressure, reduce swelling, and protect your heart, liver, and kidneys. Most people on this plan should limit their sodium intake to 1,500-2,000 mg (milligrams) of sodium each day. Canned, boxed, and frozen foods are high in sodium. Restaurant foods, fast foods, and pizza are also very high in sodium. You also get sodium by adding salt to food. Try to cook at home, eat more fresh fruits and vegetables, and eat less fast food and canned, processed, or prepared foods. This information is not intended to replace advice given to you by your health care provider. Make sure you discuss any questions you have with your healthcare provider. Document Revised: 04/28/2019 Document Reviewed: 02/22/2019 Elsevier Patient Education  2022 [employer].

## 2020-10-31 NOTE — Progress Notes (Signed)
Patient has eaten and taken medication today. Patient reports recurring cellulitis in the left knee and thigh presenting last night. Patient denies HA at this time.

## 2020-10-31 NOTE — Progress Notes (Signed)
Established Patient Office Visit  Subjective:  Patient ID: Dawn Braun, female    DOB: 08-30-1965  Age: 55 y.o. MRN: [id number]  CC:  Chief Complaint  Patient presents with   Hospitalization Follow-up    HA     HPI Dawn Braun reports that she was seen in the emergency department on October 25, 2020.  Hospital course  Hypertension Associated symptoms include headaches.  Headache   55 year old female significant history of chronic kidney disease, hypertension, paroxysmal atrial fibrillation, diabetes who presents complaining of headache.  Patient endorsed having recurrent headache ongoing for the past 2 weeks but seems to be worse this morning.  Also endorsed having some muscle aches and muscle cramps for the same duration.  She does check her blood pressure at home has noticed that her blood pressure is high which concerns her.  She denies any recent medication changes.  No report of any fever chills runny nose sneezing coughing sore throat denies any focal numbness or focal weakness.  She has been compliant with her medication.  She also takes her cholesterol medication, Lipitor.  She has been vaccinated for COVID-19.  ED Discharge Orders       None         1:32 PM Patient endorsed recurrent headache which has been ongoing for several weeks worse today.  Her primary concern is elevated blood pressure during these headaches.  She reported being compliant with her blood pressure medication.  Initially her systolic blood pressure was 205 which has improved to 183 without any specific treatment.  Head CT scan obtained showed no acute finding.  Labs remarkable for hyperkalemia with a potassium of 5.2.  She has had elevated potassium in the past.We will give [facility] and instruct patient to have her potassium level rechecked.  Patient is mentating appropriately appears to be in no acute discomfort.  I have low suspicion for meningitis/she is without any fever or nuchal rigidity,  low suspicion for subarachnoid hemorrhage as this is not an acute thunderclap type of headache and I have low suspicion for stroke or space-occupying lesion.  Instruct patient to follow-up closely with PCP for further managements of her elevated blood pressure.  Her body aches and pain may be due to statin induced drugs.  I encourage patient to hold off from taking Lipitor for the next 2 weeks and follow-up with her doctor for recheck.  Return precaution given.   Domenic Moras, PA-C 10/25/20 1340  States today that she continues to have elevated blood pressure readings at home, similar to today's reading.  Reports that she did see cardiology in May 2022, at that time her blood pressure was well controlled.  Reports that she has had some weight gain, approximately 20 pounds since the beginning of 2021.  Does endorse that she feels like she is retaining fluid.  Denies shortness of breath, does state that she does have 2 pillows when she sleeps, does endorse snoring.  Reports that she continues to have tenderness to the touch all over her entire body, worse on her lower legs.  Reports that she drinks approximately 1-2 bottles of water a day.  Is sleeping approximately 6 hours a night.   Past Medical History:  Diagnosis Date   Asthma    Chronic kidney disease (CKD) stage G3a/A3, moderately decreased glomerular filtration rate (GFR) between 45-59 mL/min/1.73 square meter and albuminuria creatinine ratio greater than 300 mg/g (HCC)    Complication of anesthesia    "takes me awhile  to wakeup" (05/17/2017)   Hypertension    Intractable nausea and vomiting    Microalbuminuria due to type 2 diabetes mellitus (HCC)    Paroxysmal atrial fibrillation (HCC)    Small bowel obstruction ([place]) 03/26/2019   Type II diabetes mellitus ([place])     Past Surgical History:  Procedure Laterality Date   CESAREAN SECTION  2003   CHOLECYSTECTOMY N/A 12/23/2016   Procedure: LAPAROSCOPIC CHOLECYSTECTOMY;  Surgeon:  Mickeal Skinner, MD;  Location: [facility];  Service: General;  Laterality: N/A;   COLONOSCOPY  05/02/2020   CG, MD   EYE SURGERY Right ~ 2016   "bleeding issues; lasered; cauterized leaks"   KNEE ARTHROSCOPY Right X 2   LAPAROTOMY N/A 03/26/2019   Procedure: EXPLORATORY LAPAROTOMY;  Surgeon: Armandina Gemma, MD;  Location: [facility];  Service: General;  Laterality: N/A;   LYSIS OF ADHESION N/A 03/26/2019   Procedure: LYSIS OF ADHESION;  Surgeon: Armandina Gemma, MD;  Location: [facility];  Service: General;  Laterality: N/A;   SPLIT NIGHT STUDY  08/06/2015   TUBAL LIGATION  2003   UPPER GASTROINTESTINAL ENDOSCOPY  05/02/2020   CG, MD    Family History  Problem Relation Age of Onset   Epilepsy Father    Diabetes Mother    Kidney disease Mother    Hypertension Mother    Kidney disease Brother    Diabetic kidney disease Brother    Diabetes Brother    Colon cancer Neg Hx    Pancreatic cancer Neg Hx    Esophageal cancer Neg Hx    Rectal cancer Neg Hx    Stomach cancer Neg Hx    Liver cancer Neg Hx     Social History   Socioeconomic History   Marital status: Single    Spouse name: Not on file   Number of children: 2   Years of education: Not on file   Highest education level: Not on file  Occupational History   Occupation: Building surveyor  Tobacco Use   Smoking status: Never   Smokeless tobacco: Never  Vaping Use   Vaping Use: Never used  Substance and Sexual Activity   Alcohol use: Yes    Alcohol/week: 1.0 standard drink    Types: 1 Glasses of wine per week    Comment: 05/17/2017 "glass of wine q other weekend"   Drug use: No   Sexual activity: Yes    Birth control/protection: Surgical  Other Topics Concern   Not on file  Social History Narrative   Patient is single she has 1 son and 1 daughter   Came to [place] for [employer] and remained after school   Employed as a Agricultural engineer   Never smoker   1 alcoholic beverage weekly   2 caffeinated  beverages daily   No drugs or tobacco   Social Determinants of Radio broadcast assistant Strain: Not on file  Food Insecurity: Not on file  Transportation Needs: Not on file  Physical Activity: Not on file  Stress: Not on file  Social Connections: Not on file  Intimate Partner Violence: Not on file    Outpatient Medications Prior to Visit  Medication Sig Dispense Refill   atorvastatin (LIPITOR) 20 MG tablet Take 1 tablet by mouth once daily 90 tablet 0   diltiazem (CARDIZEM CD) 120 MG 24 hr capsule Take 1 capsule by mouth once daily 90 capsule 1   famotidine (PEPCID) 20 MG tablet Take 1 tablet (20 mg total) by mouth 2 (  two) times daily. AM and bedtime 60 tablet 5   flecainide (TAMBOCOR) 100 MG tablet Take 1 tablet by mouth twice daily 60 tablet 0   insulin isophane & regular human (NOVOLIN 70/30 FLEXPEN) (70-30) 100 UNIT/ML KwikPen Inject 9 Units into the skin 2 (two) times daily. 15 mL 3   Insulin Pen Needle 31G X 4 MM MISC 1 Container by Does not apply route 2 (two) times daily. 100 each 0   loratadine (CLARITIN) 10 MG tablet Take 10 mg by mouth daily.     metFORMIN (GLUCOPHAGE) 500 MG tablet TAKE 2 TABLETS BY MOUTH IN THE MORNING WITH FOOD AND 1 TABLET IN THE EVENING WITH FOOD 270 tablet 1   montelukast (SINGULAIR) 10 MG tablet Take 10 mg by mouth daily.     ondansetron (ZOFRAN ODT) 4 MG disintegrating tablet Take 1 tablet (4 mg total) by mouth every 8 (eight) hours as needed for nausea or vomiting. 20 tablet 0   XARELTO 20 MG TABS tablet TAKE 1 TABLET BY MOUTH ONCE DAILY WITH  SUPPER 30 tablet 2   furosemide (LASIX) 40 MG tablet Take 1 tablet (40 mg total) by mouth daily. 5 tablet 0   lisinopril (ZESTRIL) 2.5 MG tablet Take 1 tablet by mouth once daily 90 tablet 1   No facility-administered medications prior to visit.    No Known Allergies  ROS Review of Systems  Constitutional: Negative.   HENT: Negative.    Eyes: Negative.   Respiratory:  Negative for shortness of  breath.   Cardiovascular:  Positive for leg swelling. Negative for chest pain and palpitations.  Gastrointestinal: Negative.   Endocrine: Negative.   Genitourinary: Negative.   Musculoskeletal:  Positive for myalgias.  Skin: Negative.   Allergic/Immunologic: Negative.   Neurological: Negative.   Hematological: Negative.   Psychiatric/Behavioral: Negative.       Objective:    Physical Exam Vitals and nursing note reviewed.  Constitutional:      Appearance: Normal appearance. She is obese.  HENT:     Head: Normocephalic and atraumatic.     Right Ear: External ear normal.     Left Ear: External ear normal.     Nose: Nose normal.     Mouth/Throat:     Mouth: Mucous membranes are moist.     Pharynx: Oropharynx is clear.  Eyes:     Extraocular Movements: Extraocular movements intact.     Conjunctiva/sclera: Conjunctivae normal.     Pupils: Pupils are equal, round, and reactive to light.  Cardiovascular:     Rate and Rhythm: Normal rate and regular rhythm.     Pulses: Normal pulses.          Carotid pulses are 2+ on the right side and 2+ on the left side.      Radial pulses are 2+ on the right side and 2+ on the left side.       Femoral pulses are 2+ on the right side and 2+ on the left side.      Popliteal pulses are 2+ on the right side and 2+ on the left side.       Dorsalis pedis pulses are 2+ on the right side and 2+ on the left side.       Posterior tibial pulses are 2+ on the right side and 2+ on the left side.     Heart sounds: Normal heart sounds.  Pulmonary:     Effort: Pulmonary effort is normal.     Breath  sounds: Normal breath sounds.  Musculoskeletal:        General: Normal range of motion.     Cervical back: Normal range of motion and neck supple.     Right lower leg: 1+ Edema present.     Left lower leg: 1+ Edema present.  Skin:    General: Skin is warm and dry.  Neurological:     General: No focal deficit present.     Mental Status: She is alert and  oriented to person, place, and time.  Psychiatric:        Mood and Affect: Mood normal.        Behavior: Behavior normal.        Thought Content: Thought content normal.        Judgment: Judgment normal.    BP (!) 163/87 (BP Location: Right Arm, Patient Position: Sitting, Cuff Size: Normal)   Pulse 63   Temp 98.2 F (36.8 C) (Temporal)   Resp 18   Ht 5' 2"  (1.575 m)   Wt 222 lb (100.7 kg)   LMP 12/24/2015 (Exact Date)   SpO2 98%   BMI 40.60 kg/m  Wt Readings from Last 3 Encounters:  10/31/20 222 lb (100.7 kg)  10/25/20 215 lb (97.5 kg)  08/16/20 223 lb (101.2 kg)     Health Maintenance Due  Topic Date Due   COVID-19 Vaccine (1) Never done   OPHTHALMOLOGY EXAM  Never done   Hepatitis C Screening  Never done   TETANUS/TDAP  Never done   Zoster Vaccines- Shingrix (1 of 2) Never done   Pneumococcal Vaccine 67-78 Years old (2 - PCV) 05/20/2018    There are no preventive care reminders to display for this patient.  Lab Results  Component Value Date   TSH 1.100 12/13/2019   Lab Results  Component Value Date   WBC 6.8 10/25/2020   HGB 12.1 10/25/2020   HCT 38.0 10/25/2020   MCV 88.0 10/25/2020   PLT 288 10/25/2020   Lab Results  Component Value Date   NA 141 10/25/2020   K 5.2 (H) 10/25/2020   CO2 22 10/25/2020   GLUCOSE 233 (H) 10/25/2020   BUN 31 (H) 10/25/2020   CREATININE 1.05 (H) 10/25/2020   BILITOT 0.4 10/25/2020   ALKPHOS 101 10/25/2020   AST 16 10/25/2020   ALT 14 10/25/2020   PROT 6.5 10/25/2020   ALBUMIN 3.1 (L) 10/25/2020   CALCIUM 9.3 10/25/2020   ANIONGAP 9 10/25/2020   EGFR 49 (L) 08/16/2020   Lab Results  Component Value Date   CHOL 148 08/16/2020   Lab Results  Component Value Date   HDL 59 08/16/2020   Lab Results  Component Value Date   LDLCALC 75 08/16/2020   Lab Results  Component Value Date   TRIG 69 08/16/2020   Lab Results  Component Value Date   CHOLHDL 2.5 08/16/2020   Lab Results  Component Value Date   HGBA1C  7.9 (A) 10/31/2020      Assessment & Plan:   Problem List Items Addressed This Visit       Cardiovascular and Mediastinum   Essential hypertension - Primary   Relevant Medications   lisinopril (ZESTRIL) 10 MG tablet   furosemide (LASIX) 20 MG tablet   Other Relevant Orders   Comp. Metabolic Panel (12)     Endocrine   Diabetes mellitus type 2 in obese (HCC)   Relevant Medications   lisinopril (ZESTRIL) 10 MG tablet   Other Relevant Orders  HgB A1c (Completed)   Other Visit Diagnoses     Generalized edema       Relevant Orders   Brain natriuretic peptide   Sleep disorder       Relevant Orders   Ambulatory referral to Sleep Studies       Meds ordered this encounter  Medications   lisinopril (ZESTRIL) 10 MG tablet    Sig: Take 1 tablet (10 mg total) by mouth daily.    Dispense:  30 tablet    Refill:  1    Dose change    Order Specific Question:   Supervising Provider    Answer:   Elsie Stain [[id number]]   furosemide (LASIX) 20 MG tablet    Sig: Take 1 tablet (20 mg total) by mouth daily as needed.    Dispense:  30 tablet    Refill:  0    Order Specific Question:   Supervising Provider    Answer:   Joya Gaskins, PATRICK E [[id number]]  1. Essential hypertension Increase lisinopril 10 mg.  Trial Lasix 20 mg once daily as needed.  Patient encouraged to increase hydration, low-sodium diet information given.  Patient encouraged to check blood pressure at home, keep a written log and have available for all office visits.  Red flags given for prompt reevaluation. - lisinopril (ZESTRIL) 10 MG tablet; Take 1 tablet (10 mg total) by mouth daily.  Dispense: 30 tablet; Refill: 1 - Comp. Metabolic Panel (12) - furosemide (LASIX) 20 MG tablet; Take 1 tablet (20 mg total) by mouth daily as needed.  Dispense: 30 tablet; Refill: 0  2. Diabetes mellitus type 2 in obese (HCC) A1c increased from 6.6 to 7.9. - HgB A1c  3. Generalized edema  - Brain natriuretic peptide  4. Sleep  disorder  - Ambulatory referral to Sleep Studies  Patient encouraged to return to mobile unit team in approximately 2 weeks.  Patient strongly encouraged to have prompt evaluation by cardiology.   I have reviewed the patient's medical history (PMH, PSH, Social History, Family History, Medications, and allergies) , and have been updated if relevant. I spent 31 minutes reviewing chart and  face to face time with patient.    Follow-up: Return in about 2 weeks (around 11/14/2020).    Loraine Grip Mayers, PA-C

## 2020-11-01 ENCOUNTER — Encounter: Payer: Self-pay | Admitting: Physician Assistant

## 2020-11-01 DIAGNOSIS — G479 Sleep disorder, unspecified: Secondary | ICD-10-CM | POA: Insufficient documentation

## 2020-11-01 DIAGNOSIS — R601 Generalized edema: Secondary | ICD-10-CM | POA: Insufficient documentation

## 2020-11-04 LAB — COMP. METABOLIC PANEL (12)
AST: 19 IU/L (ref 0–40)
Albumin/Globulin Ratio: 1.5 (ref 1.2–2.2)
Albumin: 3.5 g/dL — ABNORMAL LOW (ref 3.8–4.9)
Alkaline Phosphatase: 128 IU/L — ABNORMAL HIGH (ref 44–121)
BUN/Creatinine Ratio: 26 — ABNORMAL HIGH (ref 9–23)
BUN: 30 mg/dL — ABNORMAL HIGH (ref 6–24)
Bilirubin Total: <0.2 mg/dL (ref 0.0–1.2)
Calcium: 9 mg/dL (ref 8.7–10.2)
Chloride: 110 mmol/L — ABNORMAL HIGH (ref 96–106)
Creatinine, Ser: 1.14 mg/dL — ABNORMAL HIGH (ref 0.57–1.00)
Globulin, Total: 2.4 g/dL (ref 1.5–4.5)
Glucose: 200 mg/dL — ABNORMAL HIGH (ref 65–99)
Potassium: 5.4 mmol/L — ABNORMAL HIGH (ref 3.5–5.2)
Sodium: 142 mmol/L (ref 134–144)
Total Protein: 5.9 g/dL — ABNORMAL LOW (ref 6.0–8.5)
eGFR: 56 mL/min/{1.73_m2} — ABNORMAL LOW (ref >59)

## 2020-11-04 LAB — BRAIN NATRIURETIC PEPTIDE

## 2020-11-05 NOTE — Addendum Note (Signed)
Addended by: Roney Jaffe on: 11/05/2020 08:06 AM   Modules accepted: Orders

## 2020-11-06 ENCOUNTER — Telehealth: Payer: Self-pay | Admitting: *Deleted

## 2020-11-06 NOTE — Telephone Encounter (Signed)
-----   Message from Roney Jaffe, [place] sent at 11/05/2020  8:06 AM EDT ----- Please call patient and let her know that her kidney function is stable, she does have slightly elevated potassium levels and she has a liver enzyme that is slightly elevated.  Her other lab was not resulted, I do encourage her to return to the clinic to repeat this lab in increase her hydration.

## 2020-11-06 NOTE — Telephone Encounter (Signed)
Per DPR MA LVM sharing patients elevated potassium, and liver and to return to the clinic next week for FU.

## 2020-12-06 ENCOUNTER — Other Ambulatory Visit: Payer: Self-pay

## 2020-12-10 ENCOUNTER — Other Ambulatory Visit: Payer: Self-pay

## 2020-12-10 ENCOUNTER — Encounter: Payer: Self-pay | Admitting: Family Medicine

## 2020-12-10 ENCOUNTER — Ambulatory Visit (INDEPENDENT_AMBULATORY_CARE_PROVIDER_SITE_OTHER): Payer: BC Managed Care – PPO | Admitting: Family Medicine

## 2020-12-10 VITALS — BP 139/88 | HR 71 | Temp 97.3°F | Resp 16 | Ht 63.0 in | Wt 215.4 lb

## 2020-12-10 DIAGNOSIS — I10 Essential (primary) hypertension: Secondary | ICD-10-CM | POA: Diagnosis not present

## 2020-12-10 DIAGNOSIS — I480 Paroxysmal atrial fibrillation: Secondary | ICD-10-CM

## 2020-12-10 DIAGNOSIS — E785 Hyperlipidemia, unspecified: Secondary | ICD-10-CM

## 2020-12-10 DIAGNOSIS — Z6838 Body mass index (BMI) 38.0-38.9, adult: Secondary | ICD-10-CM

## 2020-12-10 DIAGNOSIS — E1165 Type 2 diabetes mellitus with hyperglycemia: Secondary | ICD-10-CM | POA: Diagnosis not present

## 2020-12-10 LAB — POCT CBG (FASTING - GLUCOSE)-MANUAL ENTRY: Glucose Fasting, POC: 538 mg/dL — AB (ref 70–99)

## 2020-12-10 MED ORDER — METFORMIN HCL 500 MG PO TABS: ORAL_TABLET | ORAL | 1 refills | Status: DC

## 2020-12-10 MED ORDER — DILTIAZEM HCL ER COATED BEADS 120 MG PO CP24: 120.0000 mg | ORAL_CAPSULE | Freq: Every day | ORAL | 0 refills | Status: DC

## 2020-12-10 MED ORDER — LISINOPRIL 40 MG PO TABS: 40.0000 mg | ORAL_TABLET | Freq: Every day | ORAL | 0 refills | Status: DC

## 2020-12-10 NOTE — Progress Notes (Signed)
Patient is here to est care. Patient is concern as to why she is having muscle cramps in her legs,back,rib cage,thighs, and hands. Patient  has been having these cramps for x 3 months.

## 2020-12-10 NOTE — Progress Notes (Signed)
New Patient Office Visit  Subjective:  Patient ID: Dawn Braun, female    DOB: [birth year]  Age: 55 y.o. MRN: [id number]  CC:  Chief Complaint  Patient presents with   Establish Care   Spasms    HPI Dawn Braun presents for follow-up of chronic medical issues.  Patient reports that she has a history of diabetes and hypertension as well as atrial fibrillation.  Patient does report some intermittent spasms.  Past Medical History:  Diagnosis Date   Asthma    Chronic kidney disease (CKD) stage G3a/A3, moderately decreased glomerular filtration rate (GFR) between 45-59 mL/min/1.73 square meter and albuminuria creatinine ratio greater than 300 mg/g (HCC)    Complication of anesthesia    "takes me awhile to wakeup" (05/17/2017)   Hypertension    Intractable nausea and vomiting    Microalbuminuria due to type 2 diabetes mellitus (HCC)    Paroxysmal atrial fibrillation (HCC)    Small bowel obstruction (HCC) 03/26/2019   Type II diabetes mellitus (HCC)       Social History   Socioeconomic History   Marital status: Single    Spouse name: Not on file   Number of children: 2   Years of education: Not on file   Highest education level: Not on file  Occupational History   Occupation: Radio broadcast assistant  Tobacco Use   Smoking status: Never   Smokeless tobacco: Never  Vaping Use   Vaping Use: Never used  Substance and Sexual Activity   Alcohol use: Yes    Alcohol/week: 1.0 standard drink    Types: 1 Glasses of wine per week    Comment: 05/17/2017 "glass of wine q other weekend"   Drug use: No   Sexual activity: Yes    Birth control/protection: Surgical  Other Topics Concern   Not on file  Social History Narrative   Patient is single she has 1 son and 1 daughter   Came to [place] for [employer] and remained after school   Employed as a Teacher, English as a foreign language   Never smoker   1 alcoholic beverage weekly   2 caffeinated beverages daily   No drugs or  tobacco   Social Determinants of Corporate investment banker Strain: Not on file  Food Insecurity: Not on file  Transportation Needs: Not on file  Physical Activity: Not on file  Stress: Not on file  Social Connections: Not on file  Intimate Partner Violence: Not on file    ROS Review of Systems  Endocrine: Positive for polydipsia and polyuria. Negative for polyphagia.  All other systems reviewed and are negative.  Objective:   Today's Vitals: BP 139/88 (BP Location: Left Arm, Patient Position: Sitting, Cuff Size: Large)   Pulse 71   Temp (!) 97.3 F (36.3 C) (Oral)   Resp 16   Ht 5\' 3"  (1.6 m)   Wt 215 lb 6.4 oz (97.7 kg)   LMP 12/24/2015 (Exact Date)   BMI 38.16 kg/m   Physical Exam Vitals and nursing note reviewed.  Constitutional:      General: She is not in acute distress.    Appearance: She is obese.  Cardiovascular:     Rate and Rhythm: Normal rate and regular rhythm.  Pulmonary:     Effort: Pulmonary effort is normal.     Breath sounds: Normal breath sounds.  Abdominal:     General: There is no distension.     Palpations: Abdomen is soft.  Musculoskeletal:  Right lower leg: No edema.     Left lower leg: No edema.  Neurological:     General: No focal deficit present.     Mental Status: She is alert and oriented to person, place, and time.    Assessment & Plan:    1. Uncontrolled type 2 diabetes mellitus with hyperglycemia (HCC) Greatly increased random blood sugar in the 500+ range.  Metformin was refilled.  Patient Novolin 70/30 was increased from 9 units twice daily to 20 units twice daily patient will be referred to [facility] for further diabetic agent management. - POCT CBG (Fasting - Glucose) - metFORMIN (GLUCOPHAGE) 500 MG tablet; TAKE 2 TABLETS BY MOUTH IN THE MORNING WITH FOOD AND 1 TABLET IN THE EVENING WITH FOOD  Dispense: 270 tablet; Refill: 1  2. Uncontrolled hypertension Elevated blood pressure readings.  Will increase lisinopril from 10  mg to 40 mg daily and monitor.  - lisinopril (ZESTRIL) 40 MG tablet; Take 1 tablet (40 mg total) by mouth daily.  Dispense: 90 tablet; Refill: 0  3. Hyperlipidemia, unspecified hyperlipidemia type Continue present management.  4. Paroxysmal atrial fibrillation [facility]) Patient to follow-up with atrial fibrillation clinic for further evaluation and management.  5. Class 2 severe obesity due to excess calories with serious comorbidity and body mass index (BMI) of 38.0 to 38.9 in adult [facility]) Discussed dietary and activity options.  Patient recommended to be on an 1800-calorie ADA diet.  Weight loss goal is 2 to 4 pounds per month weight loss.   Outpatient Encounter Medications as of 12/10/2020  Medication Sig   atorvastatin (LIPITOR) 20 MG tablet Take 1 tablet by mouth once daily   famotidine (PEPCID) 20 MG tablet Take 1 tablet (20 mg total) by mouth 2 (two) times daily. AM and bedtime   flecainide (TAMBOCOR) 100 MG tablet Take 1 tablet by mouth twice daily   furosemide (LASIX) 20 MG tablet Take 1 tablet (20 mg total) by mouth daily as needed.   lisinopril (ZESTRIL) 40 MG tablet Take 1 tablet (40 mg total) by mouth daily.   loratadine (CLARITIN) 10 MG tablet Take 10 mg by mouth daily.   montelukast (SINGULAIR) 10 MG tablet Take 10 mg by mouth daily.   ondansetron (ZOFRAN ODT) 4 MG disintegrating tablet Take 1 tablet (4 mg total) by mouth every 8 (eight) hours as needed for nausea or vomiting.   XARELTO 20 MG TABS tablet TAKE 1 TABLET BY MOUTH ONCE DAILY WITH  SUPPER   [DISCONTINUED] diltiazem (CARDIZEM CD) 120 MG 24 hr capsule Take 1 capsule by mouth once daily   [DISCONTINUED] lisinopril (ZESTRIL) 10 MG tablet Take 1 tablet (10 mg total) by mouth daily.   [DISCONTINUED] metFORMIN (GLUCOPHAGE) 500 MG tablet TAKE 2 TABLETS BY MOUTH IN THE MORNING WITH FOOD AND 1 TABLET IN THE EVENING WITH FOOD   diltiazem (CARDIZEM CD) 120 MG 24 hr capsule Take 1 capsule (120 mg total) by mouth daily.   insulin  isophane & regular human (NOVOLIN 70/30 FLEXPEN) (70-30) 100 UNIT/ML KwikPen Inject 9 Units into the skin 2 (two) times daily.   Insulin Pen Needle 31G X 4 MM MISC 1 Container by Does not apply route 2 (two) times daily. (Patient not taking: Reported on 12/10/2020)   metFORMIN (GLUCOPHAGE) 500 MG tablet TAKE 2 TABLETS BY MOUTH IN THE MORNING WITH FOOD AND 1 TABLET IN THE EVENING WITH FOOD   No facility-administered encounter medications on file as of 12/10/2020.    Follow-up: Return in about 2  weeks (around 12/24/2020) for chronic med issues, follow up.   Tommie Raymond, MD

## 2020-12-24 ENCOUNTER — Other Ambulatory Visit: Payer: Self-pay

## 2020-12-24 ENCOUNTER — Ambulatory Visit: Payer: BC Managed Care – PPO | Attending: Family Medicine | Admitting: Pharmacist

## 2020-12-24 DIAGNOSIS — N1831 Chronic kidney disease, stage 3a: Secondary | ICD-10-CM

## 2020-12-24 DIAGNOSIS — Z794 Long term (current) use of insulin: Secondary | ICD-10-CM

## 2020-12-24 DIAGNOSIS — E1122 Type 2 diabetes mellitus with diabetic chronic kidney disease: Secondary | ICD-10-CM

## 2020-12-24 MED ORDER — TRULICITY 0.75 MG/0.5ML ~~LOC~~ SOAJ: 0.7500 mg | SUBCUTANEOUS | 0 refills | Status: DC

## 2020-12-24 NOTE — Progress Notes (Signed)
    S:    PCP: Dr. Andrey Campanile  No chief complaint on file.  Patient arrives in good spirits.  Presents for diabetes evaluation, education, and management. Patient was referred and last seen by Primary Care Provider on   Patient reports Diabetes was diagnosed 17 years ago. Dx by work Engineer, civil (consulting). Initially presented with polyuria. Hospitalized once in the pst last year with DKA. Developed after taking oral prednisone for an ear infection. Treated with IV fluids, insulin. Pt started insulin ~2 years ago. No prior allergies. No pancreatitis. Gallbladder removed. No personal or family history of thyroid cancer.   Family/Social History:  -Fhx: diabetes, kidney disease, HTN -Tobacco: never smoker -Alcohol: denies use   Insurance coverage/medication affordability: BCBS  Medication adherence reported.  Current diabetes medications include: metformin 1000 mg qAM/500 mg qPM, Novolin 20 units BID  Patient denies hypoglycemic events. Verbalizes proper treatment plan.   Patient reported dietary habits: - Lost 65 lbs since diagnosis   Patient-reported exercise habits:  - Not as active as she would like    Patient denies nocturia (nighttime urination).  Patient denies neuropathy (nerve pain). Patient reports visual changes that are baseline. Receives proper eye care.  Patient reports self foot exams.     O:  Lab Results  Component Value Date   HGBA1C 7.9 (A) 10/31/2020   There were no vitals filed for this visit.  Lipid Panel     Component Value Date/Time   CHOL 148 08/16/2020 0952   TRIG 69 08/16/2020 0952   HDL 59 08/16/2020 0952   CHOLHDL 2.5 08/16/2020 0952   LDLCALC 75 08/16/2020 0952   Past week. 0430 AM fasting.  Home fasting blood sugars: mid 200s.   2 hour post-meal/random blood sugars: not checking routinely.  Clinical Atherosclerotic Cardiovascular Disease (ASCVD): No  The 10-year ASCVD risk score (Arnett DK, et al., 2019) is: 11.4%   Values used to calculate the score:      Age: 55 years     Sex: Female     Is Non-Hispanic African American: Yes     Diabetic: Yes     Tobacco smoker: No     Systolic Blood Pressure: 139 mmHg     Is BP treated: Yes     HDL Cholesterol: 59 mg/dL     Total Cholesterol: 148 mg/dL    A/P: Diabetes longstanding currently above goal. Patient is able to verbalize appropriate hypoglycemia management plan. Medication adherence appears appropriate. Would love to add SGLT-2 inhibitor. Pt has hx of electrolyte abnormalities, AKI on CKD, and dehydration. Will hold off on SGLT-2i and start Trulicity instead.  -Start Trulicity 0.75 mg weekly.  -Continue other medications for now. Could consider decrease in 70/30 dose based on Trulicity response.  -Extensively discussed pathophysiology of diabetes, recommended lifestyle interventions, dietary effects on blood sugar control -Counseled on s/sx of and management of hypoglycemia -Next A1C anticipated 01/2021.   Written patient instructions provided.  Total time in face to face counseling 30 minutes.   Follow up Pharmacist Clinic Visit in 1 month.  Butch Penny, PharmD, P[place]y [place], CPP Clinical Pharmacist [facility] [phone]

## 2020-12-31 ENCOUNTER — Ambulatory Visit: Payer: BC Managed Care – PPO | Attending: Family Medicine

## 2020-12-31 ENCOUNTER — Other Ambulatory Visit: Payer: Self-pay

## 2020-12-31 ENCOUNTER — Encounter: Payer: Self-pay | Admitting: Family Medicine

## 2020-12-31 ENCOUNTER — Ambulatory Visit (INDEPENDENT_AMBULATORY_CARE_PROVIDER_SITE_OTHER): Payer: BC Managed Care – PPO | Admitting: Family Medicine

## 2020-12-31 VITALS — BP 158/89 | HR 68 | Temp 97.7°F | Resp 16 | Wt 222.8 lb

## 2020-12-31 DIAGNOSIS — I10 Essential (primary) hypertension: Secondary | ICD-10-CM | POA: Diagnosis not present

## 2020-12-31 DIAGNOSIS — M62838 Other muscle spasm: Secondary | ICD-10-CM | POA: Diagnosis not present

## 2020-12-31 DIAGNOSIS — E1165 Type 2 diabetes mellitus with hyperglycemia: Secondary | ICD-10-CM | POA: Diagnosis not present

## 2020-12-31 LAB — GLUCOSE, POCT (MANUAL RESULT ENTRY): POC Glucose: 87 mg/dl (ref 70–99)

## 2020-12-31 MED ORDER — DILTIAZEM HCL ER 180 MG PO CP24: 180.0000 mg | ORAL_CAPSULE | Freq: Every day | ORAL | 0 refills | Status: DC

## 2020-12-31 NOTE — Progress Notes (Signed)
Patient c/o muscle cramps in the back of rib cage , and  legs especially at night.

## 2021-01-01 LAB — BASIC METABOLIC PANEL
BUN/Creatinine Ratio: 24 — ABNORMAL HIGH (ref 9–23)
BUN: 35 mg/dL — ABNORMAL HIGH (ref 6–24)
CO2: 15 mmol/L — ABNORMAL LOW (ref 20–29)
Calcium: 9.2 mg/dL (ref 8.7–10.2)
Chloride: 108 mmol/L — ABNORMAL HIGH (ref 96–106)
Creatinine, Ser: 1.44 mg/dL — ABNORMAL HIGH (ref 0.57–1.00)
Glucose: 85 mg/dL (ref 70–99)
Potassium: 5.2 mmol/L (ref 3.5–5.2)
Sodium: 142 mmol/L (ref 134–144)
eGFR: 43 mL/min/{1.73_m2} — ABNORMAL LOW (ref >59)

## 2021-01-01 LAB — MAGNESIUM: Magnesium: 2 mg/dL (ref 1.6–2.3)

## 2021-01-01 NOTE — Progress Notes (Signed)
Established Patient Office Visit  Subjective:  Patient ID: Dawn Braun, female    DOB: Aug 19, 1965  Age: 55 y.o. MRN: [id number]  CC:  Chief Complaint  Patient presents with   Diabetes   Hypertension    HPI Dawn Braun presents for follow-up of diabetes and hypertension.  Patient also reports persistent muscle spasms.  She reports that when she had the similar symptoms years ago that she had a deficiency of magnesium.  She would like that to be checked on today.  Past Medical History:  Diagnosis Date   Asthma    Chronic kidney disease (CKD) stage G3a/A3, moderately decreased glomerular filtration rate (GFR) between 45-59 mL/min/1.73 square meter and albuminuria creatinine ratio greater than 300 mg/g (HCC)    Complication of anesthesia    "takes me awhile to wakeup" (05/17/2017)   Hypertension    Intractable nausea and vomiting    Microalbuminuria due to type 2 diabetes mellitus (HCC)    Paroxysmal atrial fibrillation (HCC)    Small bowel obstruction (HCC) 03/26/2019   Type II diabetes mellitus (HCC)       Social History   Socioeconomic History   Marital status: Single    Spouse name: Not on file   Number of children: 2   Years of education: Not on file   Highest education level: Not on file  Occupational History   Occupation: Radio broadcast assistant  Tobacco Use   Smoking status: Never   Smokeless tobacco: Never  Vaping Use   Vaping Use: Never used  Substance and Sexual Activity   Alcohol use: Yes    Alcohol/week: 1.0 standard drink    Types: 1 Glasses of wine per week    Comment: 05/17/2017 "glass of wine q other weekend"   Drug use: No   Sexual activity: Yes    Birth control/protection: Surgical  Other Topics Concern   Not on file  Social History Narrative   Patient is single she has 1 son and 1 daughter   Came to [place] for [employer] and remained after school   Employed as a Teacher, English as a foreign language   Never smoker   1 alcoholic  beverage weekly   2 caffeinated beverages daily   No drugs or tobacco   Social Determinants of Corporate investment banker Strain: Not on file  Food Insecurity: Not on file  Transportation Needs: Not on file  Physical Activity: Not on file  Stress: Not on file  Social Connections: Not on file  Intimate Partner Violence: Not on file    ROS Review of Systems  Cardiovascular:  Negative for chest pain.  All other systems reviewed and are negative.  Objective:   Today's Vitals: BP (!) 158/89 (BP Location: Right Arm, Patient Position: Sitting, Cuff Size: Large)   Pulse 68   Temp 97.7 F (36.5 C) (Temporal)   Resp 16   Wt 222 lb 12.8 oz (101.1 kg)   LMP 12/24/2015 (Exact Date)   SpO2 97%   BMI 39.47 kg/m   Physical Exam Vitals and nursing note reviewed.  Constitutional:      General: She is not in acute distress.    Appearance: She is obese.  Cardiovascular:     Rate and Rhythm: Normal rate and regular rhythm.  Pulmonary:     Effort: Pulmonary effort is normal.     Breath sounds: Normal breath sounds.  Abdominal:     General: There is no distension.     Palpations: Abdomen is soft.  Musculoskeletal:     Right lower leg: No edema.     Left lower leg: No edema.  Neurological:     General: No focal deficit present.     Mental Status: She is alert and oriented to person, place, and time.    Assessment & Plan:   1. Uncontrolled type 2 diabetes mellitus with hyperglycemia (HCC) Random glucose was improved.  We will continue to monitor.  Continue present management. - POCT glucose (manual entry)  2. Uncontrolled hypertension Minimal improvement.  Will increase diltiazem from 120 to 180 mg daily and monitor.  3. Muscle spasm Monitoring labs ordered. - Magnesium - Basic Metabolic Panel  Outpatient Encounter Medications as of 12/31/2020  Medication Sig   atorvastatin (LIPITOR) 20 MG tablet Take 1 tablet by mouth once daily   diltiazem (DILACOR XR) 180 MG 24 hr  capsule Take 1 capsule (180 mg total) by mouth daily.   Dulaglutide (TRULICITY) 0.75 MG/0.5ML SOPN Inject 0.75 mg into the skin once a week.   famotidine (PEPCID) 20 MG tablet Take 1 tablet (20 mg total) by mouth 2 (two) times daily. AM and bedtime   flecainide (TAMBOCOR) 100 MG tablet Take 1 tablet by mouth twice daily   furosemide (LASIX) 20 MG tablet Take 1 tablet (20 mg total) by mouth daily as needed.   Insulin Pen Needle 31G X 4 MM MISC 1 Container by Does not apply route 2 (two) times daily.   lisinopril (ZESTRIL) 40 MG tablet Take 1 tablet (40 mg total) by mouth daily.   loratadine (CLARITIN) 10 MG tablet Take 10 mg by mouth daily.   metFORMIN (GLUCOPHAGE) 500 MG tablet TAKE 2 TABLETS BY MOUTH IN THE MORNING WITH FOOD AND 1 TABLET IN THE EVENING WITH FOOD   montelukast (SINGULAIR) 10 MG tablet Take 10 mg by mouth daily.   ondansetron (ZOFRAN ODT) 4 MG disintegrating tablet Take 1 tablet (4 mg total) by mouth every 8 (eight) hours as needed for nausea or vomiting.   XARELTO 20 MG TABS tablet TAKE 1 TABLET BY MOUTH ONCE DAILY WITH  SUPPER   [DISCONTINUED] diltiazem (CARDIZEM CD) 120 MG 24 hr capsule Take 1 capsule (120 mg total) by mouth daily.   insulin isophane & regular human (NOVOLIN 70/30 FLEXPEN) (70-30) 100 UNIT/ML KwikPen Inject 9 Units into the skin 2 (two) times daily.   No facility-administered encounter medications on file as of 12/31/2020.    Follow-up: Return in about 3 weeks (around 01/21/2021) for follow up.   Tommie Raymond, MD

## 2021-01-16 ENCOUNTER — Other Ambulatory Visit: Payer: Self-pay

## 2021-01-16 ENCOUNTER — Ambulatory Visit (INDEPENDENT_AMBULATORY_CARE_PROVIDER_SITE_OTHER): Payer: BC Managed Care – PPO | Admitting: Family Medicine

## 2021-01-16 ENCOUNTER — Encounter: Payer: Self-pay | Admitting: Family Medicine

## 2021-01-16 VITALS — BP 132/83 | HR 72 | Temp 97.9°F | Resp 16 | Wt 221.2 lb

## 2021-01-16 DIAGNOSIS — M791 Myalgia, unspecified site: Secondary | ICD-10-CM

## 2021-01-16 DIAGNOSIS — E1165 Type 2 diabetes mellitus with hyperglycemia: Secondary | ICD-10-CM

## 2021-01-16 MED ORDER — CYCLOBENZAPRINE HCL 10 MG PO TABS: 10.0000 mg | ORAL_TABLET | Freq: Every day | ORAL | 0 refills | Status: DC

## 2021-01-16 NOTE — Progress Notes (Signed)
Patient c/o her body  muscle cramping all over. Patient said that her muscle cramps are so bad that she can not sleep at night. Patient said that if she sneeze or cough her body will lock up and it will be 10/10 or more from pain

## 2021-01-16 NOTE — Progress Notes (Signed)
Established  Patient Office Visit  Subjective:  Patient ID: Dawn Braun, female    DOB: Jun 24, 1965  Age: 55 y.o. MRN: [id number]  CC:  Chief Complaint  Patient presents with   Follow-up    HPI Dawn Braun presents for follow up of diabetes and hypertension. Patient also reports that she has been having severe muscle pains. It is now interfering with her sleep. She is on lipitor and has been taking that med for at least a couple of years. Sx have started over the past couple of months and is worsening.   Past Medical History:  Diagnosis Date   Asthma    Chronic kidney disease (CKD) stage G3a/A3, moderately decreased glomerular filtration rate (GFR) between 45-59 mL/min/1.73 square meter and albuminuria creatinine ratio greater than 300 mg/g (HCC)    Complication of anesthesia    "takes me awhile to wakeup" (05/17/2017)   Hypertension    Intractable nausea and vomiting    Microalbuminuria due to type 2 diabetes mellitus (HCC)    Paroxysmal atrial fibrillation (HCC)    Small bowel obstruction (HCC) 03/26/2019   Type II diabetes mellitus (HCC)        Social History   Socioeconomic History   Marital status: Single    Spouse name: Not on file   Number of children: 2   Years of education: Not on file   Highest education level: Not on file  Occupational History   Occupation: Radio broadcast assistant  Tobacco Use   Smoking status: Never   Smokeless tobacco: Never  Vaping Use   Vaping Use: Never used  Substance and Sexual Activity   Alcohol use: Yes    Alcohol/week: 1.0 standard drink    Types: 1 Glasses of wine per week    Comment: 05/17/2017 "glass of wine q other weekend"   Drug use: No   Sexual activity: Yes    Birth control/protection: Surgical  Other Topics Concern   Not on file  Social History Narrative   Patient is single she has 1 son and 1 daughter   Came to [place] for [employer] and remained after school   Employed as a Health visitor   Never smoker   1 alcoholic beverage weekly   2 caffeinated beverages daily   No drugs or tobacco   Social Determinants of Corporate investment banker Strain: Not on file  Food Insecurity: Not on file  Transportation Needs: Not on file  Physical Activity: Not on file  Stress: Not on file  Social Connections: Not on file  Intimate Partner Violence: Not on file    ROS Review of Systems  Musculoskeletal:  Positive for myalgias.  All other systems reviewed and are negative.  Objective:   Today's Vitals: BP 132/83   Pulse 72   Temp 97.9 F (36.6 C) (Oral)   Resp 16   Wt 221 lb 3.2 oz (100.3 kg)   LMP 12/24/2015 (Exact Date)   HC 16" (40.6 cm)   SpO2 98%   BMI 39.18 kg/m   Physical Exam Vitals and nursing note reviewed.  Constitutional:      General: She is not in acute distress. Cardiovascular:     Rate and Rhythm: Normal rate and regular rhythm.  Pulmonary:     Effort: Pulmonary effort is normal.     Breath sounds: Normal breath sounds.  Abdominal:     Palpations: Abdomen is soft.     Tenderness: There is no abdominal tenderness.  Neurological:  General: No focal deficit present.     Mental Status: She is alert and oriented to person, place, and time.    Assessment & Plan:   1. Muscle pain Discussed with patient in detail.  Monitoring labs ordered.  Will discontinue Lipitor.  Flexeril given to help with symptoms and sleep.  Will monitor. - Basic Metabolic Panel - Magnesium  2. Uncontrolled type 2 diabetes mellitus with hyperglycemia [facility]) Patient reports improvement with present management.    Outpatient Encounter Medications as of 01/16/2021  Medication Sig   atorvastatin (LIPITOR) 20 MG tablet Take 1 tablet by mouth once daily   cyclobenzaprine (FLEXERIL) 10 MG tablet Take 1 tablet (10 mg total) by mouth at bedtime.   diltiazem (DILACOR XR) 180 MG 24 hr capsule Take 1 capsule (180 mg total) by mouth daily.   Dulaglutide (TRULICITY)  0.75 MG/0.5ML SOPN Inject 0.75 mg into the skin once a week.   famotidine (PEPCID) 20 MG tablet Take 1 tablet (20 mg total) by mouth 2 (two) times daily. AM and bedtime   flecainide (TAMBOCOR) 100 MG tablet Take 1 tablet by mouth twice daily   furosemide (LASIX) 20 MG tablet Take 1 tablet (20 mg total) by mouth daily as needed.   Insulin Pen Needle 31G X 4 MM MISC 1 Container by Does not apply route 2 (two) times daily.   lisinopril (ZESTRIL) 40 MG tablet Take 1 tablet (40 mg total) by mouth daily.   loratadine (CLARITIN) 10 MG tablet Take 10 mg by mouth daily.   metFORMIN (GLUCOPHAGE) 500 MG tablet TAKE 2 TABLETS BY MOUTH IN THE MORNING WITH FOOD AND 1 TABLET IN THE EVENING WITH FOOD   montelukast (SINGULAIR) 10 MG tablet Take 10 mg by mouth daily.   ondansetron (ZOFRAN ODT) 4 MG disintegrating tablet Take 1 tablet (4 mg total) by mouth every 8 (eight) hours as needed for nausea or vomiting.   XARELTO 20 MG TABS tablet TAKE 1 TABLET BY MOUTH ONCE DAILY WITH  SUPPER   insulin isophane & regular human (NOVOLIN 70/30 FLEXPEN) (70-30) 100 UNIT/ML KwikPen Inject 9 Units into the skin 2 (two) times daily.   No facility-administered encounter medications on file as of 01/16/2021.    Follow-up: No follow-ups on file.   Tommie Raymond, MD

## 2021-01-17 LAB — MAGNESIUM: Magnesium: 2.2 mg/dL (ref 1.6–2.3)

## 2021-01-17 LAB — BASIC METABOLIC PANEL
BUN/Creatinine Ratio: 23 (ref 9–23)
BUN: 33 mg/dL — ABNORMAL HIGH (ref 6–24)
CO2: 20 mmol/L (ref 20–29)
Calcium: 9.3 mg/dL (ref 8.7–10.2)
Chloride: 108 mmol/L — ABNORMAL HIGH (ref 96–106)
Creatinine, Ser: 1.43 mg/dL — ABNORMAL HIGH (ref 0.57–1.00)
Glucose: 111 mg/dL — ABNORMAL HIGH (ref 70–99)
Potassium: 6 mmol/L — ABNORMAL HIGH (ref 3.5–5.2)
Sodium: 140 mmol/L (ref 134–144)
eGFR: 43 mL/min/{1.73_m2} — ABNORMAL LOW (ref >59)

## 2021-01-28 ENCOUNTER — Ambulatory Visit: Payer: BC Managed Care – PPO | Attending: Family Medicine | Admitting: Pharmacist

## 2021-01-28 ENCOUNTER — Other Ambulatory Visit: Payer: Self-pay

## 2021-01-28 DIAGNOSIS — E1165 Type 2 diabetes mellitus with hyperglycemia: Secondary | ICD-10-CM | POA: Diagnosis not present

## 2021-01-28 MED ORDER — TRULICITY 1.5 MG/0.5ML ~~LOC~~ SOAJ: 1.5000 mg | SUBCUTANEOUS | 2 refills | Status: DC

## 2021-01-28 MED ORDER — METFORMIN HCL 500 MG PO TABS: 500.0000 mg | ORAL_TABLET | Freq: Two times a day (BID) | ORAL | 1 refills | Status: DC

## 2021-01-28 NOTE — Progress Notes (Addendum)
Patient visit virtually via MyChart Video in the context of Covid-19 pandemic.  I connected with Dawn Braun on 01/28/2021 at 2:00 PM est by video and verified that I am speaking with the correct person using two identifiers.  I discussed the limitations, risks, security and privacy concerns of performing an evaluation and management service by video and the availability of in person appointments. I also discussed with the patient that there may be a patient responsible charge related to this service. The patient expressed understanding and agreed to proceed.  Patient location: at home  My Location:  in the office  Persons on the video call:  myself and the patient  S:    PCP: Dr. Andrey Campanile  No chief complaint on file.  Patient arrives in good spirits.  Presents for diabetes evaluation, education, and management. Patient was referred and last seen by Primary Care Provider on 01/16/2021. We saw her on 12/24/2020 and started Trulicity.   Today, she reports tolerating the Trulicity well. Denies NV, abdominal pain.   Family/Social History:  -Fhx: diabetes, kidney disease, HTN -Tobacco: never smoker -Alcohol: denies use   Insurance coverage/medication affordability: BCBS  Medication adherence reported.  Current diabetes medications include: metformin 1000 mg qAM/500 mg qPM, Novolin 70/30 20 units BID, Trulicity 0.75 mg weekly  Patient denies hypoglycemic events. Verbalizes proper treatment plan.   Patient reported dietary habits: - Lost 65 lbs since diagnosis   Patient-reported exercise habits:  - Not as active as she would like    Patient denies nocturia (nighttime urination).  Patient denies neuropathy (nerve pain). Patient reports visual changes that are baseline. Receives proper eye care.  Patient reports self foot exams.     O:  Lab Results  Component Value Date   HGBA1C 7.9 (A) 10/31/2020   There were no vitals filed for this visit.  Lipid Panel      Component Value Date/Time   CHOL 148 08/16/2020 0952   TRIG 69 08/16/2020 0952   HDL 59 08/16/2020 0952   CHOLHDL 2.5 08/16/2020 0952   LDLCALC 75 08/16/2020 0952   Past week. 0430 AM fasting.  Home fasting blood sugars: 100s   2 hour post-meal/random blood sugars: not checking routinely.  Clinical Atherosclerotic Cardiovascular Disease (ASCVD): No  The 10-year ASCVD risk score (Arnett DK, et al., 2019) is: 9.7%   Values used to calculate the score:     Age: 55 years     Sex: Female     Is Non-Hispanic African American: Yes     Diabetic: Yes     Tobacco smoker: No     Systolic Blood Pressure: 132 mmHg     Is BP treated: Yes     HDL Cholesterol: 59 mg/dL     Total Cholesterol: 148 mg/dL    A/P: Diabetes longstanding currently above goal. Patient is able to verbalize appropriate hypoglycemia management plan. Medication adherence appears appropriate. Will decrease metformin to 500 mg BID given renal function and increase Trulicity to 1.5 mg weekly.  -Increase Trulicityto 1.5 mg weekly.  -Decrease metformin to 500 mg BID.  -Continue Novolin at current dose for now.  -Extensively discussed pathophysiology of diabetes, recommended lifestyle interventions, dietary effects on blood sugar control -Counseled on s/sx of and management of hypoglycemia -Next A1C anticipated 01/2021.   Written patient instructions provided.  Total time in face to face counseling 30 minutes.   Follow up Pharmacist Clinic Visit in 1 month.  Butch Penny, PharmD, BCACP, CPP Clinical Pharmacist [facility] [phone]

## 2021-01-30 ENCOUNTER — Encounter: Payer: Self-pay | Admitting: Pharmacist

## 2021-02-11 ENCOUNTER — Telehealth: Payer: Self-pay | Admitting: Cardiology

## 2021-02-11 MED ORDER — DILTIAZEM HCL ER 180 MG PO CP24: 180.0000 mg | ORAL_CAPSULE | Freq: Every day | ORAL | 1 refills | Status: DC

## 2021-02-11 MED ORDER — FLECAINIDE ACETATE 100 MG PO TABS
100.0000 mg | ORAL_TABLET | Freq: Two times a day (BID) | ORAL | 1 refills | Status: DC
Start: 2021-02-11 — End: 2022-04-29

## 2021-02-11 NOTE — Telephone Encounter (Signed)
 *  STAT* If patient is at the pharmacy, call can be transferred to refill team.   1. Which medications need to be refilled? (please list name of each medication and dose if known)   diltiazem (DILACOR XR) 180 MG 24 hr capsule  flecainide (TAMBOCOR) 100 MG tablet  2. Which pharmacy/location (including street and city if local pharmacy) is medication to be sent to? [facility] [address], [place] - [address]  3. Do they need a 30 day or 90 day supply? 90 days

## 2021-02-14 ENCOUNTER — Encounter: Payer: Self-pay | Admitting: Family Medicine

## 2021-02-14 ENCOUNTER — Ambulatory Visit (INDEPENDENT_AMBULATORY_CARE_PROVIDER_SITE_OTHER): Payer: BC Managed Care – PPO | Admitting: Family Medicine

## 2021-02-14 ENCOUNTER — Other Ambulatory Visit: Payer: Self-pay

## 2021-02-14 VITALS — BP 150/89 | HR 76 | Temp 98.1°F | Resp 16 | Wt 231.0 lb

## 2021-02-14 DIAGNOSIS — E875 Hyperkalemia: Secondary | ICD-10-CM | POA: Diagnosis not present

## 2021-02-14 DIAGNOSIS — M62838 Other muscle spasm: Secondary | ICD-10-CM

## 2021-02-14 DIAGNOSIS — E878 Other disorders of electrolyte and fluid balance, not elsewhere classified: Secondary | ICD-10-CM

## 2021-02-14 DIAGNOSIS — E1165 Type 2 diabetes mellitus with hyperglycemia: Secondary | ICD-10-CM | POA: Diagnosis not present

## 2021-02-14 NOTE — Progress Notes (Signed)
This is a 4 wk follow-up back pain

## 2021-02-14 NOTE — Progress Notes (Signed)
Established Patient Office Visit  Subjective:  Patient ID: Dawn Braun, female    DOB: [birth year]  Age: 55 y.o. MRN: [id number]  CC:  Chief Complaint  Patient presents with   Follow-up    HPI Dawn Braun presents for follow-up of muscle pains.  Her symptoms have not changed significantly since stopping the statin.  Past Medical History:  Diagnosis Date   Asthma    Chronic kidney disease (CKD) stage G3a/A3, moderately decreased glomerular filtration rate (GFR) between 45-59 mL/min/1.73 square meter and albuminuria creatinine ratio greater than 300 mg/g (HCC)    Complication of anesthesia    "takes me awhile to wakeup" (05/17/2017)   Hypertension    Intractable nausea and vomiting    Microalbuminuria due to type 2 diabetes mellitus (HCC)    Paroxysmal atrial fibrillation ([place])    Small bowel obstruction ([place]) 03/26/2019   Type II diabetes mellitus ([place])     Past Surgical History:  Procedure Laterality Date   CESAREAN SECTION  2003   CHOLECYSTECTOMY N/A 12/23/2016   Procedure: LAPAROSCOPIC CHOLECYSTECTOMY;  Surgeon: Mickeal Skinner, MD;  Location: [facility];  Service: General;  Laterality: N/A;   COLONOSCOPY  05/02/2020   CG, MD   EYE SURGERY Right ~ 2016   "bleeding issues; lasered; cauterized leaks"   KNEE ARTHROSCOPY Right X 2   LAPAROTOMY N/A 03/26/2019   Procedure: EXPLORATORY LAPAROTOMY;  Surgeon: Armandina Gemma, MD;  [facility];  Service: General;  Laterality: N/A;   LYSIS OF ADHESION N/A 03/26/2019   Procedure: LYSIS OF ADHESION;  Surgeon: Armandina Gemma, MD;  Location[facility];  Service: General;  Laterality: N/A;   SPLIT NIGHT STUDY  08/06/2015   TUBAL LIGATION  2003   UPPER GASTROINTESTINAL ENDOSCOPY  05/02/2020   CG, MD    Family History  Problem Relation Age of Onset   Epilepsy Father    Diabetes Mother    Kidney disease Mother    Hypertension Mother    Kidney disease Brother    Diabetic kidney disease Brother    Diabetes Brother     Colon cancer Neg Hx    Pancreatic cancer Neg Hx    Esophageal cancer Neg Hx    Rectal cancer Neg Hx    Stomach cancer Neg Hx    Liver cancer Neg Hx     Social History   Socioeconomic History   Marital status: Single    Spouse name: Not on file   Number of children: 2   Years of education: Not on file   Highest education level: Not on file  Occupational History   Occupation: Building surveyor  Tobacco Use   Smoking status: Never   Smokeless tobacco: Never  Vaping Use   Vaping Use: Never used  Substance and Sexual Activity   Alcohol use: Yes    Alcohol/week: 1.0 standard drink    Types: 1 Glasses of wine per week    Comment: 05/17/2017 "glass of wine q other weekend"   Drug use: No   Sexual activity: Yes    Birth control/protection: Surgical  Other Topics Concern   Not on file  Social History Narrative   Patient is single she has 1 son and 1 daughter   Came to [place] for [employer] and remained after school   Employed as a Agricultural engineer   Never smoker   1 alcoholic beverage weekly   2 caffeinated beverages daily   No drugs or tobacco   Social Determinants of  Health   Financial Resource Strain: Not on file  Food Insecurity: Not on file  Transportation Needs: Not on file  Physical Activity: Not on file  Stress: Not on file  Social Connections: Not on file  Intimate Partner Violence: Not on file    ROS Review of Systems  Musculoskeletal:  Positive for myalgias.  All other systems reviewed and are negative.  Objective:   Today's Vitals: BP (!) 150/89   Pulse 76   Temp 98.1 F (36.7 C) (Oral)   Resp 16   Wt 231 lb (104.8 kg)   LMP 12/24/2015 (Exact Date)   SpO2 98%   BMI 40.92 kg/m   Physical Exam Vitals and nursing note reviewed.  Constitutional:      General: She is not in acute distress. Cardiovascular:     Rate and Rhythm: Normal rate and regular rhythm.  Pulmonary:     Effort: Pulmonary effort is normal.     Breath  sounds: Normal breath sounds.  Abdominal:     Palpations: Abdomen is soft.     Tenderness: There is no abdominal tenderness.  Neurological:     General: No focal deficit present.     Mental Status: She is alert and oriented to person, place, and time.    Assessment & Plan:   1. Serum potassium elevated Monitoring labs ordered.  Consider referral pending results. - CMP14+EGFR  2. Electrolyte and fluid disorder Monitoring labs ordered.  Consider referral pending results. - CMP14+EGFR  3. Muscle spasm Rheumatologic and monitoring labs obtained. continue DC of statin.  Consider referral pending results. - ANA - Sedimentation Rate - Rheumatoid factor - CMP14+EGFR  4. Uncontrolled type 2 diabetes mellitus with hyperglycemia (HCC)  - Hemoglobin A1c  Outpatient Encounter Medications as of 02/14/2021  Medication Sig   cyclobenzaprine (FLEXERIL) 10 MG tablet Take 1 tablet (10 mg total) by mouth at bedtime.   diltiazem (DILACOR XR) 180 MG 24 hr capsule Take 1 capsule (180 mg total) by mouth daily.   Dulaglutide (TRULICITY) 1.5 YW/7.3XT SOPN Inject 1.5 mg into the skin once a week.   famotidine (PEPCID) 20 MG tablet Take 1 tablet (20 mg total) by mouth 2 (two) times daily. AM and bedtime   flecainide (TAMBOCOR) 100 MG tablet Take 1 tablet (100 mg total) by mouth 2 (two) times daily.   Insulin Pen Needle 31G X 4 MM MISC 1 Container by Does not apply route 2 (two) times daily.   lisinopril (ZESTRIL) 40 MG tablet Take 1 tablet (40 mg total) by mouth daily.   loratadine (CLARITIN) 10 MG tablet Take 10 mg by mouth daily.   metFORMIN (GLUCOPHAGE) 500 MG tablet Take 1 tablet (500 mg total) by mouth 2 (two) times daily with a meal.   montelukast (SINGULAIR) 10 MG tablet Take 10 mg by mouth daily.   ondansetron (ZOFRAN ODT) 4 MG disintegrating tablet Take 1 tablet (4 mg total) by mouth every 8 (eight) hours as needed for nausea or vomiting.   XARELTO 20 MG TABS tablet TAKE 1 TABLET BY MOUTH  ONCE DAILY WITH  SUPPER   insulin isophane & regular human (NOVOLIN 70/30 FLEXPEN) (70-30) 100 UNIT/ML KwikPen Inject 9 Units into the skin 2 (two) times daily.   [DISCONTINUED] atorvastatin (LIPITOR) 20 MG tablet Take 1 tablet by mouth once daily   [DISCONTINUED] furosemide (LASIX) 20 MG tablet Take 1 tablet (20 mg total) by mouth daily as needed.   No facility-administered encounter medications on file as of 02/14/2021.  Follow-up: No follow-ups on file.   Becky Sax, MD

## 2021-02-15 LAB — CMP14+EGFR
ALT: 11 IU/L (ref 0–32)
AST: 15 IU/L (ref 0–40)
Albumin/Globulin Ratio: 1.6 (ref 1.2–2.2)
Albumin: 3.7 g/dL — ABNORMAL LOW (ref 3.8–4.9)
Alkaline Phosphatase: 135 IU/L — ABNORMAL HIGH (ref 44–121)
BUN/Creatinine Ratio: 22 (ref 9–23)
BUN: 27 mg/dL — ABNORMAL HIGH (ref 6–24)
Bilirubin Total: <0.2 mg/dL (ref 0.0–1.2)
CO2: 22 mmol/L (ref 20–29)
Calcium: 9.2 mg/dL (ref 8.7–10.2)
Chloride: 108 mmol/L — ABNORMAL HIGH (ref 96–106)
Creatinine, Ser: 1.25 mg/dL — ABNORMAL HIGH (ref 0.57–1.00)
Globulin, Total: 2.3 g/dL (ref 1.5–4.5)
Glucose: 84 mg/dL (ref 70–99)
Potassium: 5.6 mmol/L — ABNORMAL HIGH (ref 3.5–5.2)
Sodium: 142 mmol/L (ref 134–144)
Total Protein: 6 g/dL (ref 6.0–8.5)
eGFR: 51 mL/min/{1.73_m2} — ABNORMAL LOW (ref >59)

## 2021-02-15 LAB — HEMOGLOBIN A1C
Est. average glucose Bld gHb Est-mCnc: 200 mg/dL
Hgb A1c MFr Bld: 8.6 % — ABNORMAL HIGH (ref 4.8–5.6)

## 2021-02-15 LAB — RHEUMATOID FACTOR: Rheumatoid fact SerPl-aCnc: <10 IU/mL (ref <14.0)

## 2021-02-15 LAB — SEDIMENTATION RATE: Sed Rate: 31 mm/hr (ref 0–40)

## 2021-02-15 LAB — ANA: Anti Nuclear Antibody (ANA): NEGATIVE

## 2021-03-17 ENCOUNTER — Telehealth: Payer: Self-pay

## 2021-03-17 NOTE — Telephone Encounter (Signed)
Your sleep study order has expired. Call our office to schedule an office visit to re-evaluate the need for a sleep study.  Sincerely,  [facility]  

## 2021-03-22 ENCOUNTER — Other Ambulatory Visit: Payer: Self-pay | Admitting: Family Medicine

## 2021-03-22 DIAGNOSIS — I10 Essential (primary) hypertension: Secondary | ICD-10-CM

## 2021-04-25 ENCOUNTER — Ambulatory Visit: Payer: Self-pay

## 2021-04-25 ENCOUNTER — Ambulatory Visit (INDEPENDENT_AMBULATORY_CARE_PROVIDER_SITE_OTHER): Payer: BC Managed Care – PPO | Admitting: Physician Assistant

## 2021-04-25 VITALS — Ht 63.0 in | Wt 236.0 lb

## 2021-04-25 DIAGNOSIS — M25562 Pain in left knee: Secondary | ICD-10-CM

## 2021-04-25 NOTE — Progress Notes (Signed)
Office Visit Note   Patient: Dawn Braun           Date of Birth: Sep 17, 1965           MRN: [id number] Visit Date: 04/25/2021              Requested by: Dorna Mai, [address] [place],  [place] [address] PCP: Dorna Mai, MD  Chief Complaint  Patient presents with   Left Knee - Pain      HPI: Patient is a pleasant 56 year old woman with a history of left knee pain.  She denies any injury.  She denies any fever or chills.  She has difficulty sitting at work with her knee flexed she has significant pain.  She prefers to have her knee out straight.  She does get feelings of locking and catching.  Focuses over the medial side of her knee.  She is a diabetic with chronic kidney disease.  She is taking moderate doses of anti-inflammatories to help with her pain.  She knows she should not be doing this.  She has had steroid injections in the past which caused her blood sugar to go over 600 and she had to be hospitalized.  Assessment & Plan: Visit Diagnoses:  1. Acute pain of left knee     Plan: Patient with acute history of left knee pain radiating into her proximal tibia.  Unfortunately she cannot take anti-inflammatories because she does have a history of chronic kidney disease and has been told not to.  I did counsel her not to continue to take them.  She has had an injection in the past however this caused significant elevation of her blood glucose that required hospitalization.  Her current A1c is 8.6.  She is having some locking catching and going to order an MRI to evaluate for any type of meniscal injury follow-up once this is completed  Follow-Up Instructions: No follow-ups on file.   Ortho Exam  Patient is alert, oriented, no adenopathy, well-dressed, normal affect, normal respiratory effort. Examination of her left knee no effusion no redness no warmth.  She is acutely tender over the medial joint line radiates into the proximal tibia.  She does  have some mild tenderness in the proximal tibia as well.  Compartments are soft nontender negative Bevelyn Buckles' sign she has good varus and valgus stability without pain.  No apprehension sign over the patella no real lateral pain.  Imaging: No results found. No images are attached to the encounter.  Labs: Lab Results  Component Value Date   HGBA1C 8.6 (H) 02/14/2021   HGBA1C 7.9 (A) 10/31/2020   HGBA1C 6.6 (H) 12/13/2019   ESRSEDRATE 31 02/14/2021   ESRSEDRATE 58 (H) 07/16/2019   CRP 3.0 (H) 07/16/2019   LABURIC 7.6 (H) 08/03/2019   REPTSTATUS 01/06/2017 FINAL 12/31/2016   CULT  12/31/2016    NO GROWTH 5 DAYS Performed at [facility], [place] [address], [place], [place] [address]      Lab Results  Component Value Date   ALBUMIN 3.7 (L) 02/14/2021   ALBUMIN 3.5 (L) 10/31/2020   ALBUMIN 3.1 (L) 10/25/2020    Lab Results  Component Value Date   MG 2.2 01/16/2021   MG 2.0 12/31/2020   MG 1.6 (L) 01/02/2017   Lab Results  Component Value Date   VD25OH 15.9 (L) 12/13/2019    No results found for: PREALBUMIN CBC EXTENDED Latest Ref Rng & Units 10/25/2020 05/12/2020 12/13/2019  WBC  4.0 - 10.5 K/uL 6.8 7.6 7.4  RBC 3.87 - 5.11 MIL/uL 4.32 4.54 4.24  HGB 12.0 - 15.0 g/dL 12.1 12.6 11.4  HCT 36.0 - 46.0 % 38.0 40.3 36.0  PLT 150 - 400 K/uL 288 339 342  NEUTROABS 1.4 - 7.0 x10E3/uL - - 3.7  LYMPHSABS 0.7 - 3.1 x10E3/uL - - 2.8     Body mass index is 41.81 kg/m.  Orders:  Orders Placed This Encounter  Procedures   XR Knee 1-2 Views Left   MR Knee Left w/o contrast   No orders of the defined types were placed in this encounter.    Procedures: No procedures performed  Clinical Data: No additional findings.  ROS:  All other systems negative, except as noted in the HPI. Review of Systems  Objective: Vital Signs: Ht 5\' 3"  (1.6 m)    Wt 236 lb (107 kg)    LMP 12/24/2015 (Exact Date)    BMI 41.81 kg/m   Specialty Comments:  No specialty comments  available.  PMFS History: Patient Active Problem List   Diagnosis Date Noted   Sleep disorder 11/01/2020   Generalized edema 11/01/2020   Secondary hypercoagulable state ([place]) 04/01/2020   Hypercholesterolemia 04/01/2020   Essential hypertension 04/01/2020   Chronic kidney disease (CKD) stage G3a/A3, moderately decreased glomerular filtration rate (GFR) between 45-59 mL/min/1.73 square meter and albuminuria creatinine ratio greater than 300 mg/g (HCC)    Women's annual routine gynecological examination 03/04/2020   CKD (chronic kidney disease) stage 3, GFR 30-59 ml/min (HCC) 03/28/2019   Obesity (BMI 30-39.9) 03/28/2019   Chronic anticoagulation 03/28/2019   Closed loop SBO (small bowel obstruction) s/p exlap /lysis of adhesions 03/26/2019 03/25/2019   Acute renal failure superimposed on stage 3 chronic kidney disease ([place]) 09/19/2018   DKA (diabetic ketoacidoses) 09/18/2018   Leukocytosis 05/17/2017   Hypokalemia 01/02/2017   SIRS (systemic inflammatory response syndrome) ([place]) 12/31/2016   Hemoperitoneum 12/31/2016   Intractable nausea and vomiting 12/31/2016   AKI (acute kidney injury) ([place]) 12/31/2016   Paroxysmal atrial fibrillation (HCC)    Asthma    Diabetes mellitus type 2 in obese ([place]) 12/23/2016   Menopausal symptom 05/01/2016   Past Medical History:  Diagnosis Date   Asthma    Chronic kidney disease (CKD) stage G3a/A3, moderately decreased glomerular filtration rate (GFR) between 45-59 mL/min/1.73 square meter and albuminuria creatinine ratio greater than 300 mg/g (HCC)    Complication of anesthesia    "takes me awhile to wakeup" (05/17/2017)   Hypertension    Intractable nausea and vomiting    Microalbuminuria due to type 2 diabetes mellitus (HCC)    Paroxysmal atrial fibrillation (HCC)    Small bowel obstruction ([place]) 03/26/2019   Type II diabetes mellitus ([place])     Family History  Problem Relation Age of Onset   Epilepsy Father    Diabetes Mother    Kidney  disease Mother    Hypertension Mother    Kidney disease Brother    Diabetic kidney disease Brother    Diabetes Brother    Colon cancer Neg Hx    Pancreatic cancer Neg Hx    Esophageal cancer Neg Hx    Rectal cancer Neg Hx    Stomach cancer Neg Hx    Liver cancer Neg Hx     Past Surgical History:  Procedure Laterality Date   CESAREAN SECTION  2003   CHOLECYSTECTOMY N/A 12/23/2016   Procedure: LAPAROSCOPIC CHOLECYSTECTOMY;  Surgeon: Kinsinger, Arta Bruce, MD;  [facility];  Service: General;  Laterality: N/A;   COLONOSCOPY  05/02/2020   CG, MD   EYE SURGERY Right ~ 2016   "bleeding issues; lasered; cauterized leaks"   KNEE ARTHROSCOPY Right X 2   LAPAROTOMY N/A 03/26/2019   Procedure: EXPLORATORY LAPAROTOMY;  Surgeon: Armandina Gemma, MD;  [facility];  Service: General;  Laterality: N/A;   LYSIS OF ADHESION N/A 03/26/2019   Procedure: LYSIS OF ADHESION;  Surgeon: Armandina Gemma, MD;  [facility];  Service: General;  Laterality: N/A;   SPLIT NIGHT STUDY  08/06/2015   TUBAL LIGATION  2003   UPPER GASTROINTESTINAL ENDOSCOPY  05/02/2020   CG, MD   Social History   Occupational History   Occupation: Building surveyor  Tobacco Use   Smoking status: Never   Smokeless tobacco: Never  Vaping Use   Vaping Use: Never used  Substance and Sexual Activity   Alcohol use: Yes    Alcohol/week: 1.0 standard drink    Types: 1 Glasses of wine per week    Comment: 05/17/2017 "glass of wine q other weekend"   Drug use: No   Sexual activity: Yes    Birth control/protection: Surgical

## 2021-05-08 ENCOUNTER — Other Ambulatory Visit: Payer: Self-pay

## 2021-05-08 ENCOUNTER — Ambulatory Visit
Admission: RE | Admit: 2021-05-08 | Discharge: 2021-05-08 | Disposition: A | Discharge status: Home / Self Care | Payer: BC Managed Care – PPO | Source: Ambulatory Visit | Attending: Physician Assistant | Admitting: Physician Assistant

## 2021-05-08 DIAGNOSIS — M25562 Pain in left knee: Secondary | ICD-10-CM

## 2021-05-23 ENCOUNTER — Encounter: Payer: Self-pay | Admitting: Orthopaedic Surgery

## 2021-05-23 ENCOUNTER — Telehealth: Payer: Self-pay

## 2021-05-23 ENCOUNTER — Other Ambulatory Visit: Payer: Self-pay

## 2021-05-23 ENCOUNTER — Ambulatory Visit (INDEPENDENT_AMBULATORY_CARE_PROVIDER_SITE_OTHER): Payer: BC Managed Care – PPO | Admitting: Orthopaedic Surgery

## 2021-05-23 DIAGNOSIS — Z6841 Body Mass Index (BMI) 40.0 and over, adult: Secondary | ICD-10-CM

## 2021-05-23 DIAGNOSIS — M1712 Unilateral primary osteoarthritis, left knee: Secondary | ICD-10-CM | POA: Diagnosis not present

## 2021-05-23 NOTE — Progress Notes (Signed)
Office Visit Note   Patient: Dawn Braun           Date of Birth: 1966/04/03           MRN: [id number] Visit Date: 05/23/2021              Requested by: Dorna Mai, [address] [place],  [place] [address] PCP: Dorna Mai, MD   Assessment & Plan: Visit Diagnoses:  1. Primary osteoarthritis of left knee   2. Morbid obesity ([place])   3. Body mass index 40.0-44.9, adult (HCC)     Plan: MRI shows mild chondral thinning.  No structural abnormalities.  Small Baker's cyst.  These findings were reviewed with the patient and I do not believe that the MRI findings explain her why her pain is so severe.  I feel that the pain is mostly caused by the edema.  I recommend seeing her PCP about diuretics and wearing compression socks.  We talked about option of cortisone injection versus Visco injection and I feel that with her A1c being elevated its a better idea to get approval for Visco injection at this time so that it does not elevate her A1c even more.  We will see her back for the gel injection.  The patient meets the AMA guidelines for Morbid (severe) obesity with a BMI > 40.0 and I have recommended weight loss.  This patient is diagnosed with osteoarthritis of the knee(s).    Radiographs show evidence of joint space narrowing, osteophytes, subchondral sclerosis and/or subchondral cysts.  This patient has knee pain which interferes with functional and activities of daily living.    This patient has experienced inadequate response, adverse effects and/or intolerance with conservative treatments such as acetaminophen, NSAIDS, topical creams, physical therapy or regular exercise, knee bracing and/or weight loss.   This patient has experienced inadequate response or has a contraindication to intra articular steroid injections for at least 3 months.   This patient is not scheduled to have a total knee replacement within 6 months of starting treatment with  viscosupplementation.  Follow-Up Instructions: No follow-ups on file.   Orders:  No orders of the defined types were placed in this encounter.  No orders of the defined types were placed in this encounter.     Procedures: No procedures performed   Clinical Data: No additional findings.   Subjective: Chief Complaint  Patient presents with   Left Knee - Pain, Follow-up    HPI  Dawn Braun returns today to discuss recent left knee MRI.  She continues to have diffuse left knee pain and calf pain and ankle pain.  Review of Systems  Constitutional: Negative.   HENT: Negative.    Eyes: Negative.   Respiratory: Negative.    Cardiovascular: Negative.   Endocrine: Negative.   Musculoskeletal: Negative.   Neurological: Negative.   Hematological: Negative.   Psychiatric/Behavioral: Negative.    All other systems reviewed and are negative.   Objective: Vital Signs: LMP 12/24/2015 (Exact Date)   Physical Exam Vitals and nursing note reviewed.  Constitutional:      Appearance: She is well-developed.  Pulmonary:     Effort: Pulmonary effort is normal.  Skin:    General: Skin is warm.     Capillary Refill: Capillary refill takes less than 2 seconds.  Neurological:     Mental Status: She is alert and oriented to person, place, and time.  Psychiatric:        Behavior: Behavior normal.  Thought Content: Thought content normal.        Judgment: Judgment normal.    Ortho Exam  Examination of the left lower extremity shows 2+ pitting edema.  She has diffuse tenderness to light palpation throughout the knee calf and ankle.  No joint effusion.  She guards with knee range of motion.  Specialty Comments:  No specialty comments available.  Imaging: No results found.   PMFS History: Patient Active Problem List   Diagnosis Date Noted   Primary osteoarthritis of left knee 05/23/2021   Morbid obesity (HCC) 05/23/2021   Body mass index 40.0-44.9, adult (HCC)  05/23/2021   Sleep disorder 11/01/2020   Generalized edema 11/01/2020   Secondary hypercoagulable state (HCC) 04/01/2020   Hypercholesterolemia 04/01/2020   Essential hypertension 04/01/2020   Chronic kidney disease (CKD) stage G3a/A3, moderately decreased glomerular filtration rate (GFR) between 45-59 mL/min/1.73 square meter and albuminuria creatinine ratio greater than 300 mg/g (HCC)    Women's annual routine gynecological examination 03/04/2020   CKD (chronic kidney disease) stage 3, GFR 30-59 ml/min (HCC) 03/28/2019   Obesity (BMI 30-39.9) 03/28/2019   Chronic anticoagulation 03/28/2019   Closed loop SBO (small bowel obstruction) s/p exlap /lysis of adhesions 03/26/2019 03/25/2019   Acute renal failure superimposed on stage 3 chronic kidney disease (HCC) 09/19/2018   DKA (diabetic ketoacidoses) 09/18/2018   Leukocytosis 05/17/2017   Hypokalemia 01/02/2017   SIRS (systemic inflammatory response syndrome) (HCC) 12/31/2016   Hemoperitoneum 12/31/2016   Intractable nausea and vomiting 12/31/2016   AKI (acute kidney injury) (HCC) 12/31/2016   Paroxysmal atrial fibrillation (HCC)    Asthma    Diabetes mellitus type 2 in obese (HCC) 12/23/2016   Menopausal symptom 05/01/2016   Past Medical History:  Diagnosis Date   Asthma    Chronic kidney disease (CKD) stage G3a/A3, moderately decreased glomerular filtration rate (GFR) between 45-59 mL/min/1.73 square meter and albuminuria creatinine ratio greater than 300 mg/g (HCC)    Complication of anesthesia    "takes me awhile to wakeup" (05/17/2017)   Hypertension    Intractable nausea and vomiting    Microalbuminuria due to type 2 diabetes mellitus (HCC)    Paroxysmal atrial fibrillation (HCC)    Small bowel obstruction (HCC) 03/26/2019   Type II diabetes mellitus (HCC)     Family History  Problem Relation Age of Onset   Epilepsy Father    Diabetes Mother    Kidney disease Mother    Hypertension Mother    Kidney disease Brother     Diabetic kidney disease Brother    Diabetes Brother    Colon cancer Neg Hx    Pancreatic cancer Neg Hx    Esophageal cancer Neg Hx    Rectal cancer Neg Hx    Stomach cancer Neg Hx    Liver cancer Neg Hx     Past Surgical History:  Procedure Laterality Date   CESAREAN SECTION  2003   CHOLECYSTECTOMY N/A 12/23/2016   Procedure: LAPAROSCOPIC CHOLECYSTECTOMY;  Surgeon: Kinsinger, De Blanch, MD;  [facility];  Service: General;  Laterality: N/A;   COLONOSCOPY  05/02/2020   CG, MD   EYE SURGERY Right ~ 2016   "bleeding issues; lasered; cauterized leaks"   KNEE ARTHROSCOPY Right X 2   LAPAROTOMY N/A 03/26/2019   Procedure: EXPLORATORY LAPAROTOMY;  Surgeon: Darnell Level, MD;  [facility];  Service: General;  Laterality: N/A;   LYSIS OF ADHESION N/A 03/26/2019   Procedure: LYSIS OF ADHESION;  Surgeon: Darnell Level, MD;  Location:  [facility];  Service: General;  Laterality: N/A;   SPLIT NIGHT STUDY  08/06/2015   TUBAL LIGATION  2003   UPPER GASTROINTESTINAL ENDOSCOPY  05/02/2020   CG, MD   Social History   Occupational History   Occupation: Building surveyor  Tobacco Use   Smoking status: Never   Smokeless tobacco: Never  Vaping Use   Vaping Use: Never used  Substance and Sexual Activity   Alcohol use: Yes    Alcohol/week: 1.0 standard drink    Types: 1 Glasses of wine per week    Comment: 05/17/2017 "glass of wine q other weekend"   Drug use: No   Sexual activity: Yes    Birth control/protection: Surgical

## 2021-05-23 NOTE — Telephone Encounter (Signed)
Noted  

## 2021-05-23 NOTE — Telephone Encounter (Signed)
Left knee gel injection ?

## 2021-05-29 ENCOUNTER — Telehealth: Payer: Self-pay

## 2021-05-29 NOTE — Telephone Encounter (Signed)
VOB submitted for Orthovisc, left knee. BV pending. 

## 2021-06-09 ENCOUNTER — Telehealth: Payer: Self-pay

## 2021-06-09 NOTE — Telephone Encounter (Signed)
Talked with patient and advised her that gel injection is not covered under her medical plan.  I did offer TriVisc to patient, but she stated that she would think about it and give [place] a call back. ? ?

## 2021-06-26 ENCOUNTER — Other Ambulatory Visit: Payer: Self-pay | Admitting: Family Medicine

## 2021-06-26 DIAGNOSIS — I10 Essential (primary) hypertension: Secondary | ICD-10-CM

## 2021-06-26 NOTE — Progress Notes (Signed)
?Cardiology Office Note:   ? ?Date:  06/28/2021  ? ?ID:  Dawn Braun, DOB [birth year], MRN [id number] ? ?PCP:  Dawn Mai, MD  ?Cardiologist:  Dawn Dresser, MD ? ?Referring MD: Dawn Mai, MD  ? ?CC: follow up ? ?History of Present Illness:   ? ?Dawn Braun is a 56 y.o. female with a hx of prior SBO s/p surgery, uncontrolled type 2 diabetes with history of DKA, paroxysmal atrial fibrillation followed by afib clinic, asthma, stage 3 CKD with history of AKI, obesity who is seen for follow up today. I initially saw her 05/01/19 as a new consult at the request of Dawn Mai, MD for the evaluation and management of paroxysmal atrial fibrillation. ? ?Today: ?Since her last visit, she was seen in Urgent Care 07/01/2020 after 6 days of persistent LE edema. She was treated with 40 mg Lasix for 5 days. She followed up with Coletta Memos, NP 08/16/2020 and reported increased snoring and daytime somnolence, so a sleep study was ordered but not completed. On 10/25/2020 she presented to the ED with recurrent headaches for several weeks and hypertensive urgency (123456 systolic). Labs were remarkable for hyperkalemia, potassium of 5.2. ? ?Overall, she is feeling good regarding her heart. However she is mostly concerned about her bilateral LE edema. At times, she has also been told that she looks swollen in her bilateral UE and face. She sits at a desk for work, and tries to get up every 30 minutes. By the time she gets home, she is able to see the sock/shoe lines after removing her shoes. Even sitting on the exam table at this time, she is able to feel tingling sensations travelling distally. This will occur while she is driving as well. Also, she reports rapid weight gain. When her diltiazem was increased, she was unable to determine if her swelling worsened. ? ?Generally, she denies any recurrent arrhythmias. Last weekend, she felt "real slow." She slept for most of the day. This was the first time  she felt this way in a very long time. ? ?Her insulin is managed by Dr. Redmond Braun. She states she takes a 70/30 blend, and recently her insulin was increased to 18 units twice a day. This morning her blood sugar was 145. Normally it is closer to 119-120. It is rare for her to feel like her sugar is low. ? ?She denies any palpitations, chest pain, or shortness of breath. No lightheadedness, headaches, syncope, orthopnea, or PND. No fevers or chills. ? ?Past Medical History:  ?Diagnosis Date  ? Asthma   ? Chronic kidney disease (CKD) stage G3a/A3, moderately decreased glomerular filtration rate (GFR) between 45-59 mL/min/1.73 square meter and albuminuria creatinine ratio greater than 300 mg/g (HCC)   ? Complication of anesthesia   ? "takes me awhile to wakeup" (05/17/2017)  ? Hypertension   ? Intractable nausea and vomiting   ? Microalbuminuria due to type 2 diabetes mellitus ([place])   ? Paroxysmal atrial fibrillation (HCC)   ? Small bowel obstruction ([place]) 03/26/2019  ? Type II diabetes mellitus ([place])   ? ? ?Past Surgical History:  ?Procedure Laterality Date  ? CESAREAN SECTION  2003  ? CHOLECYSTECTOMY N/A 12/23/2016  ? Procedure: LAPAROSCOPIC CHOLECYSTECTOMY;  Surgeon: Dawn, Arta Bruce, MD;  Location: [facility];  Service: General;  Laterality: N/A;  ? COLONOSCOPY  05/02/2020  ? CG, MD  ? EYE SURGERY Right ~ 2016  ? "bleeding issues; lasered; cauterized leaks"  ? KNEE ARTHROSCOPY Right X 2  ?  LAPAROTOMY N/A 03/26/2019  ? Procedure: EXPLORATORY LAPAROTOMY;  Surgeon: Dawn Gemma, MD;  Location: [facility];  Service: General;  Laterality: N/A;  ? LYSIS OF ADHESION N/A 03/26/2019  ? Procedure: LYSIS OF ADHESION;  Surgeon: Dawn Gemma, MD;  Location: [facility];  Service: General;  Laterality: N/A;  ? SPLIT NIGHT STUDY  08/06/2015  ? TUBAL LIGATION  2003  ? UPPER GASTROINTESTINAL ENDOSCOPY  05/02/2020  ? CG, MD  ? ? ?Current Medications: ?Current Outpatient Medications on File Prior to Visit  ?Medication Sig  ? cyclobenzaprine  (FLEXERIL) 10 MG tablet Take 1 tablet (10 mg total) by mouth at bedtime.  ? diltiazem (DILACOR XR) 180 MG 24 hr capsule Take 1 capsule (180 mg total) by mouth daily.  ? Dulaglutide (TRULICITY) 1.5 0000000 SOPN Inject 1.5 mg into the skin once a week.  ? famotidine (PEPCID) 20 MG tablet Take 1 tablet (20 mg total) by mouth 2 (two) times daily. AM and bedtime  ? flecainide (TAMBOCOR) 100 MG tablet Take 1 tablet (100 mg total) by mouth 2 (two) times daily.  ? Insulin Pen Needle 31G X 4 MM MISC 1 Container by Does not apply route 2 (two) times daily.  ? lisinopril (ZESTRIL) 40 MG tablet Take 1 tablet by mouth once daily  ? loratadine (CLARITIN) 10 MG tablet Take 10 mg by mouth daily.  ? metFORMIN (GLUCOPHAGE) 500 MG tablet Take 1 tablet (500 mg total) by mouth 2 (two) times daily with a meal.  ? montelukast (SINGULAIR) 10 MG tablet Take 10 mg by mouth daily.  ? ondansetron (ZOFRAN ODT) 4 MG disintegrating tablet Take 1 tablet (4 mg total) by mouth every 8 (eight) hours as needed for nausea or vomiting.  ? XARELTO 20 MG TABS tablet TAKE 1 TABLET BY MOUTH ONCE DAILY WITH  SUPPER  ? insulin isophane & regular human (NOVOLIN 70/30 FLEXPEN) (70-30) 100 UNIT/ML KwikPen Inject 9 Units into the skin 2 (two) times daily.  ? ?No current facility-administered medications on file prior to visit.  ?  ? ?Allergies:   Patient has no known allergies.  ? ?Social History  ? ?Tobacco Use  ? Smoking status: Never  ? Smokeless tobacco: Never  ?Vaping Use  ? Vaping Use: Never used  ?Substance Use Topics  ? Alcohol use: Yes  ?  Alcohol/week: 1.0 standard drink  ?  Types: 1 Glasses of wine per week  ?  Comment: 05/17/2017 "glass of wine q other weekend"  ? Drug use: No  ? ? ?Family History: ?family history includes Diabetes in her brother and mother; Diabetic kidney disease in her brother; Epilepsy in her father; Hypertension in her mother; Kidney disease in her brother and mother. There is no history of Colon cancer, Pancreatic cancer,  Esophageal cancer, Rectal cancer, Stomach cancer, or Liver cancer. ? ?ROS:   ?Please see the history of present illness.   ?(+) Peripheral edema ?(+) Facial edema ?(+) Weight gain ?(+) Tingling sensations of bilateral LE ?(+) Fatigue/Malaise ?Additional pertinent ROS otherwise unremarkable. ? ?EKGs/Labs/Other Studies Reviewed:   ? ?The following studies were reviewed today: ? ?Left LE Venous Doppler 07/16/2019: ?Summary:  ? ?LEFT:  ?- Findings appear essentially unchanged compared to previous examination.  ?- There is no evidence of deep vein thrombosis in the lower extremity.  ?   ?- No cystic structure found in the popliteal fossa.  ? ?Echo 12/2017: ?Study Conclusions  ? ?- Left ventricle: The cavity size was normal. Wall thickness was  ?  increased  in a pattern of moderate LVH. Systolic function was  ?  vigorous. The estimated ejection fraction was in the range of 65%  ?  to 70%. Wall motion was normal; there were no regional wall  ?  motion abnormalities. Features are consistent with a pseudonormal  ?  left ventricular filling pattern, with concomitant abnormal  ?  relaxation and increased filling pressure (grade 2 diastolic  ?  dysfunction).  ?- Mitral valve: There was mild regurgitation.  ?- Left atrium: The atrium was mildly dilated.  ? ?ETT 01/31/16:  ?The patient walked for 7 minutes of a Braun protocol GXT. ?She acieved a peak HR of 150 which is 89% predicted maximal HR ?There were no ST or T wave changes to suggest ischemia. ?There was minimal widening of the QRS at peak exercise. There were no arrhythmias at peak exercise ?Negative GXT ? ?Echo 07/2015: ?Study Conclusions  ?- Left ventricle: The cavity size was normal. There was mild  ?  concentric hypertrophy. Systolic function was normal. The  ?  estimated ejection fraction was in the range of 60% to 65%. Wall  ?  motion was normal; there were no regional wall motion  ?  abnormalities.  ?- Aortic valve: Trileaflet; mildly thickened, mildly calcified  ?   leaflets.  ?- Mitral valve: There was mild regurgitation.  ? ?EKG:  EKG is personally reviewed.   ?06/27/2021: SR at 67 bpm with 1st degree AV block, prwp ?07/31/2019: NSR, LPFB ? ?Recent Labs: ?10/25/2020: Hemoglobin 12

## 2021-06-27 ENCOUNTER — Ambulatory Visit (INDEPENDENT_AMBULATORY_CARE_PROVIDER_SITE_OTHER): Payer: BC Managed Care – PPO | Admitting: Cardiology

## 2021-06-27 ENCOUNTER — Other Ambulatory Visit: Payer: Self-pay

## 2021-06-27 VITALS — BP 132/86 | HR 67 | Ht 63.0 in | Wt 234.3 lb

## 2021-06-27 DIAGNOSIS — Z79899 Other long term (current) drug therapy: Secondary | ICD-10-CM

## 2021-06-27 DIAGNOSIS — E1169 Type 2 diabetes mellitus with other specified complication: Secondary | ICD-10-CM

## 2021-06-27 DIAGNOSIS — D6869 Other thrombophilia: Secondary | ICD-10-CM

## 2021-06-27 DIAGNOSIS — Z7189 Other specified counseling: Secondary | ICD-10-CM

## 2021-06-27 DIAGNOSIS — Z7901 Long term (current) use of anticoagulants: Secondary | ICD-10-CM

## 2021-06-27 DIAGNOSIS — E669 Obesity, unspecified: Secondary | ICD-10-CM

## 2021-06-27 DIAGNOSIS — I10 Essential (primary) hypertension: Secondary | ICD-10-CM

## 2021-06-27 DIAGNOSIS — I480 Paroxysmal atrial fibrillation: Secondary | ICD-10-CM

## 2021-06-27 MED ORDER — EMPAGLIFLOZIN 10 MG PO TABS: 10.0000 mg | ORAL_TABLET | Freq: Every day | ORAL | 11 refills | Status: DC

## 2021-06-27 NOTE — Patient Instructions (Signed)
Medication Instructions:  ?We are going to start a new medication Jardiance 10 mg (SGLT2i) daily. With the start of this, drop your insulin to 15 unites 70/30 twice a day; if your sugar is low, please let me know and we will drop back further. ? ?*If you need a refill on your cardiac medications before your next appointment, please call your pharmacy* ? ? ?Lab Work: ?Your provider has recommended lab work in 3 weeks (BMP). Please have this collected at [facility]. The lab is open 8:00 am - 4:30 pm. Please avoid 12:00p - 1:00p for lunch hour. You do not need an appointment. Please go to [address] [place], [place] [address]. This is in the Primary Care office on the 3rd floor, let them know you are there for blood work and they will direct you to the lab. ? ?If you have labs (blood work) drawn today and your tests are completely normal, you will receive your results only by: ?MyChart Message (if you have MyChart) OR ?A paper copy in the mail ?If you have any lab test that is abnormal or we need to change your treatment, we will call you to review the results. ? ? ?Testing/Procedures: ?None ordered today ? ? ?Follow-Up: ?At [facility], you and your health needs are our priority.  As part of our continuing mission to provide you with exceptional heart care, we have created designated Provider Care Teams.  These Care Teams include your primary Cardiologist (physician) and Advanced Practice Providers (APPs -  Physician Assistants and Nurse Practitioners) who all work together to provide you with the care you need, when you need it. ? ?We recommend signing up for the patient portal called "MyChart".  Sign up information is provided on this After Visit Summary.  MyChart is used to connect with patients for Virtual Visits (Telemedicine).  Patients are able to view lab/test results, encounter notes, upcoming appointments, etc.  Non-urgent messages can be sent to your provider as  well.   ?To learn more about what you can do with MyChart, go to [email or web address].   ? ?Your next appointment:   ?3 month(s) ? ?The format for your next appointment:   ?In Person ? ?Provider:   ?Jodelle Red, MD{ ? ?. ?

## 2021-06-28 ENCOUNTER — Encounter (HOSPITAL_BASED_OUTPATIENT_CLINIC_OR_DEPARTMENT_OTHER): Payer: Self-pay | Admitting: Cardiology

## 2021-07-18 DIAGNOSIS — Z79899 Other long term (current) drug therapy: Secondary | ICD-10-CM | POA: Diagnosis not present

## 2021-07-18 LAB — BASIC METABOLIC PANEL
BUN/Creatinine Ratio: 21 (ref 9–23)
BUN: 34 mg/dL — ABNORMAL HIGH (ref 6–24)
CO2: 19 mmol/L — ABNORMAL LOW (ref 20–29)
Calcium: 9.1 mg/dL (ref 8.7–10.2)
Chloride: 110 mmol/L — ABNORMAL HIGH (ref 96–106)
Creatinine, Ser: 1.59 mg/dL — ABNORMAL HIGH (ref 0.57–1.00)
Glucose: 137 mg/dL — ABNORMAL HIGH (ref 70–99)
Potassium: 6 mmol/L — ABNORMAL HIGH (ref 3.5–5.2)
Sodium: 141 mmol/L (ref 134–144)
eGFR: 38 mL/min/{1.73_m2} — ABNORMAL LOW (ref >59)

## 2021-07-25 ENCOUNTER — Other Ambulatory Visit: Payer: Self-pay | Admitting: Family Medicine

## 2021-07-25 DIAGNOSIS — I10 Essential (primary) hypertension: Secondary | ICD-10-CM

## 2021-07-25 NOTE — Telephone Encounter (Signed)
Requested Prescriptions  ?Pending Prescriptions Disp Refills  ?? lisinopril (ZESTRIL) 40 MG tablet [Pharmacy Med Name: Lisinopril 40 MG Oral Tablet] 30 tablet 0  ?  Sig: Take 1 tablet by mouth once daily  ?  ? Cardiovascular:  ACE Inhibitors Failed - 07/25/2021  9:40 AM  ?  ?  Failed - Cr in normal range and within 180 days  ?  Creatinine, Ser  ?Date Value Ref Range Status  ?07/18/2021 1.59 (H) 0.57 - 1.00 mg/dL Final  ? ?Creatinine, Urine  ?Date Value Ref Range Status  ?12/24/2016 278.42 mg/dL Final  ?  Comment:  ?  Performed at [facility] [address], [place], [place] [address]  ?12/24/2016 277.32 mg/dL Final  ?   ?  ?  Failed - K in normal range and within 180 days  ?  Potassium  ?Date Value Ref Range Status  ?07/18/2021 6.0 (H) 3.5 - 5.2 mmol/L Final  ?   ?  ?  Passed - Patient is not pregnant  ?  ?  Passed - Last BP in normal range  ?  BP Readings from Last 1 Encounters:  ?06/27/21 132/86  ?   ?  ?  Passed - Valid encounter within last 6 months  ?  Recent Outpatient Visits   ?      ? 5 months ago Serum potassium elevated  ? Primary Care at [facility], MD  ? 5 months ago Uncontrolled type 2 diabetes mellitus with hyperglycemia ([place])  ? [place], RPH-CPP  ? 6 months ago Muscle pain  ? Primary Care at [facility], MD  ? 6 months ago Uncontrolled type 2 diabetes mellitus with hyperglycemia ([place])  ? Primary Care at [facility], MD  ? 7 months ago Type 2 diabetes mellitus with stage 3a chronic kidney disease, with long-term current use of insulin ([place])  ? [place], RPH-CPP  ?  ?  ?Future Appointments   ?        ? In 2 months Buford Dresser, MD [facility]  ?  ? ?  ?  ?  ? ? ?

## 2021-08-19 ENCOUNTER — Telehealth: Payer: Self-pay | Admitting: Cardiology

## 2021-08-19 ENCOUNTER — Other Ambulatory Visit (HOSPITAL_BASED_OUTPATIENT_CLINIC_OR_DEPARTMENT_OTHER): Payer: Self-pay

## 2021-08-19 DIAGNOSIS — Z79899 Other long term (current) drug therapy: Secondary | ICD-10-CM

## 2021-08-19 NOTE — Telephone Encounter (Signed)
Pt returning call. Transferred to [facility].  ?

## 2021-08-19 NOTE — Telephone Encounter (Signed)
Pt updated with lab results along with MD's recommendations. ?Repeat lab order placed ?

## 2021-09-30 ENCOUNTER — Other Ambulatory Visit: Payer: Self-pay | Admitting: Cardiology

## 2021-10-01 ENCOUNTER — Ambulatory Visit (INDEPENDENT_AMBULATORY_CARE_PROVIDER_SITE_OTHER): Payer: BC Managed Care – PPO | Admitting: Orthopaedic Surgery

## 2021-10-01 ENCOUNTER — Encounter: Payer: Self-pay | Admitting: Orthopaedic Surgery

## 2021-10-01 DIAGNOSIS — Z6841 Body Mass Index (BMI) 40.0 and over, adult: Secondary | ICD-10-CM

## 2021-10-01 DIAGNOSIS — M1712 Unilateral primary osteoarthritis, left knee: Secondary | ICD-10-CM | POA: Diagnosis not present

## 2021-10-01 MED ORDER — LIDOCAINE HCL 1 % IJ SOLN
2.0000 mL | INTRAMUSCULAR | Status: AC | PRN
  Administered 2021-10-01: 2 mL

## 2021-10-01 MED ORDER — METHYLPREDNISOLONE ACETATE 40 MG/ML IJ SUSP
40.0000 mg | INTRAMUSCULAR | Status: AC | PRN
  Administered 2021-10-01: 40 mg via INTRA_ARTICULAR

## 2021-10-01 MED ORDER — BUPIVACAINE HCL 0.5 % IJ SOLN
2.0000 mL | INTRAMUSCULAR | Status: AC | PRN
  Administered 2021-10-01: 2 mL via INTRA_ARTICULAR

## 2021-10-01 NOTE — Progress Notes (Signed)
Office Visit Note   Patient: Dawn Braun           Date of Birth: Oct 10, 1965           MRN: [id number] Visit Date: 10/01/2021              Requested by: Dawn Skeans, MD [address] [place],  [place] [address] PCP: Dawn Skeans, MD   Assessment & Plan: Visit Diagnoses:  1. Primary osteoarthritis of left knee   2. Body mass index 40.0-44.9, adult (HCC)   3. Morbid obesity (HCC)     Plan: Impression is left knee OA.  MRI shows early chondromalacia of the medial and patellofemoral compartments.  Pain is fairly severe.  Strongly urged the patient to make all efforts at losing weight.  She understands how this affects knee pain.  Diabetes is moderately controlled.  I think it is still fine to administer cortisone injection today.  She tolerated the injection well.  We will see her back as needed.  Follow-Up Instructions: No follow-ups on file.   Orders:  No orders of the defined types were placed in this encounter.  No orders of the defined types were placed in this encounter.     Procedures: Large Joint Inj: L knee on 10/01/2021 9:27 AM Details: 22 G needle Medications: 2 mL bupivacaine 0.5 %; 2 mL lidocaine 1 %; 40 mg methylPREDNISolone acetate 40 MG/ML Outcome: tolerated well, no immediate complications Patient was prepped and draped in the usual sterile fashion.       Clinical Data: No additional findings.   Subjective: Chief Complaint  Patient presents with   Left Knee - Pain    HPI [name] is following up for chronic left knee pain.  Insurance denied Visco injection.  Try Visco may be an option.  Patient will think about this.  She is having trouble sleeping and currently wearing an OA reaction brace.  Takes Tylenol and Advil.  Has chronic nighttime pain as well.  Review of Systems  Constitutional: Negative.   HENT: Negative.    Eyes: Negative.   Respiratory: Negative.    Cardiovascular: Negative.   Endocrine: Negative.    Musculoskeletal: Negative.   Neurological: Negative.   Hematological: Negative.   Psychiatric/Behavioral: Negative.    All other systems reviewed and are negative.    Objective: Vital Signs: LMP 12/24/2015 (Exact Date)   Physical Exam Vitals and nursing note reviewed.  Constitutional:      Appearance: She is well-developed.  Pulmonary:     Effort: Pulmonary effort is normal.  Skin:    General: Skin is warm.     Capillary Refill: Capillary refill takes less than 2 seconds.  Neurological:     Mental Status: She is alert and oriented to person, place, and time.  Psychiatric:        Behavior: Behavior normal.        Thought Content: Thought content normal.        Judgment: Judgment normal.     Ortho Exam Examination left knee shows no joint effusion.  Pain with range of motion.  Joint line tenderness.  Collateral and cruciates are stable. Specialty Comments:  No specialty comments available.  Imaging: No results found.   PMFS History: Patient Active Problem List   Diagnosis Date Noted   Primary osteoarthritis of left knee 05/23/2021   Morbid obesity (HCC) 05/23/2021   Body mass index 40.0-44.9, adult (HCC) 05/23/2021   Sleep disorder 11/01/2020   Generalized edema  11/01/2020   Secondary hypercoagulable state ([place]) 04/01/2020   Hypercholesterolemia 04/01/2020   Essential hypertension 04/01/2020   Chronic kidney disease (CKD) stage G3a/A3, moderately decreased glomerular filtration rate (GFR) between 45-59 mL/min/1.73 square meter and albuminuria creatinine ratio greater than 300 mg/g (HCC)    Women's annual routine gynecological examination 03/04/2020   CKD (chronic kidney disease) stage 3, GFR 30-59 ml/min (HCC) 03/28/2019   Obesity (BMI 30-39.9) 03/28/2019   Chronic anticoagulation 03/28/2019   Closed loop SBO (small bowel obstruction) s/p exlap /lysis of adhesions 03/26/2019 03/25/2019   Acute renal failure superimposed on stage 3 chronic kidney disease ([place])  09/19/2018   DKA (diabetic ketoacidoses) 09/18/2018   Leukocytosis 05/17/2017   Hypokalemia 01/02/2017   SIRS (systemic inflammatory response syndrome) ([place]) 12/31/2016   Hemoperitoneum 12/31/2016   Intractable nausea and vomiting 12/31/2016   AKI (acute kidney injury) ([place]) 12/31/2016   Paroxysmal atrial fibrillation (HCC)    Asthma    Diabetes mellitus type 2 in obese ([place]) 12/23/2016   Menopausal symptom 05/01/2016   Past Medical History:  Diagnosis Date   Asthma    Chronic kidney disease (CKD) stage G3a/A3, moderately decreased glomerular filtration rate (GFR) between 45-59 mL/min/1.73 square meter and albuminuria creatinine ratio greater than 300 mg/g (HCC)    Complication of anesthesia    "takes me awhile to wakeup" (05/17/2017)   Hypertension    Intractable nausea and vomiting    Microalbuminuria due to type 2 diabetes mellitus (HCC)    Paroxysmal atrial fibrillation (HCC)    Small bowel obstruction ([place]) 03/26/2019   Type II diabetes mellitus ([place])     Family History  Problem Relation Age of Onset   Epilepsy Father    Diabetes Mother    Kidney disease Mother    Hypertension Mother    Kidney disease Brother    Diabetic kidney disease Brother    Diabetes Brother    Colon cancer Neg Hx    Pancreatic cancer Neg Hx    Esophageal cancer Neg Hx    Rectal cancer Neg Hx    Stomach cancer Neg Hx    Liver cancer Neg Hx     Past Surgical History:  Procedure Laterality Date   CESAREAN SECTION  2003   CHOLECYSTECTOMY N/A 12/23/2016   Procedure: LAPAROSCOPIC CHOLECYSTECTOMY;  Surgeon: Kinsinger, Arta Bruce, MD;  [facility];  Service: General;  Laterality: N/A;   COLONOSCOPY  05/02/2020   CG, MD   EYE SURGERY Right ~ 2016   "bleeding issues; lasered; cauterized leaks"   KNEE ARTHROSCOPY Right X 2   LAPAROTOMY N/A 03/26/2019   Procedure: EXPLORATORY LAPAROTOMY;  Surgeon: Armandina Gemma, MD;  [facility];  Service: General;  Laterality: N/A;   LYSIS OF ADHESION N/A  03/26/2019   Procedure: LYSIS OF ADHESION;  Surgeon: Armandina Gemma, MD;  [facility];  Service: General;  Laterality: N/A;   SPLIT NIGHT STUDY  08/06/2015   TUBAL LIGATION  2003   UPPER GASTROINTESTINAL ENDOSCOPY  05/02/2020   CG, MD   Social History   Occupational History   Occupation: Building surveyor  Tobacco Use   Smoking status: Never   Smokeless tobacco: Never  Vaping Use   Vaping Use: Never used  Substance and Sexual Activity   Alcohol use: Yes    Alcohol/week: 1.0 standard drink of alcohol    Types: 1 Glasses of wine per week    Comment: 05/17/2017 "glass of wine q other weekend"   Drug use: No   Sexual  activity: Yes    Birth control/protection: Surgical

## 2021-10-06 ENCOUNTER — Ambulatory Visit (HOSPITAL_BASED_OUTPATIENT_CLINIC_OR_DEPARTMENT_OTHER): Payer: BC Managed Care – PPO | Admitting: Cardiology

## 2021-10-08 DIAGNOSIS — Z6841 Body Mass Index (BMI) 40.0 and over, adult: Secondary | ICD-10-CM | POA: Diagnosis not present

## 2021-10-08 DIAGNOSIS — U071 COVID-19: Secondary | ICD-10-CM | POA: Diagnosis not present

## 2021-10-08 DIAGNOSIS — I10 Essential (primary) hypertension: Secondary | ICD-10-CM | POA: Diagnosis not present

## 2021-10-28 ENCOUNTER — Ambulatory Visit (HOSPITAL_BASED_OUTPATIENT_CLINIC_OR_DEPARTMENT_OTHER): Payer: BC Managed Care – PPO | Admitting: Cardiology

## 2021-12-04 DIAGNOSIS — M25562 Pain in left knee: Secondary | ICD-10-CM | POA: Diagnosis not present

## 2021-12-18 ENCOUNTER — Ambulatory Visit (HOSPITAL_BASED_OUTPATIENT_CLINIC_OR_DEPARTMENT_OTHER): Payer: BC Managed Care – PPO | Admitting: Cardiology

## 2022-01-09 ENCOUNTER — Encounter: Payer: Self-pay | Admitting: *Deleted

## 2022-01-09 NOTE — Progress Notes (Signed)
[facility] Quality Team Note  Name: Dawn Braun Date of Birth: [birth year] MRN: [id number] Date: 01/09/2022  [facility] Quality Team has reviewed this patient's chart, please see recommendations below:  [facility] Quality Other; (Pt has open gaps for AWV and A1C.  Tried to call pt to see if I could arrange but had to leave a voice mail.)

## 2022-01-09 NOTE — Progress Notes (Signed)
[facility] Quality Team Note  Name: Dawn Braun Date of Birth: 10-Nov-1965 MRN: [id number] Date: 01/09/2022  [facility] Quality Team has reviewed this patient's chart, please see recommendations below:  [facility] Quality Other; (Pt has open gaps for AWV and A1C.  Had to leave pt a voice mail.  Was trying to arrange for pt.)

## 2022-02-06 ENCOUNTER — Other Ambulatory Visit: Payer: Self-pay | Admitting: Family Medicine

## 2022-02-06 NOTE — Progress Notes (Signed)
Order has been added to next visit for provider to address 

## 2022-03-17 DIAGNOSIS — M25562 Pain in left knee: Secondary | ICD-10-CM | POA: Diagnosis not present

## 2022-04-13 DIAGNOSIS — R052 Subacute cough: Secondary | ICD-10-CM | POA: Diagnosis not present

## 2022-04-13 DIAGNOSIS — J22 Unspecified acute lower respiratory infection: Secondary | ICD-10-CM | POA: Diagnosis not present

## 2022-04-13 DIAGNOSIS — Z136 Encounter for screening for cardiovascular disorders: Secondary | ICD-10-CM | POA: Diagnosis not present

## 2022-04-13 DIAGNOSIS — Z0001 Encounter for general adult medical examination with abnormal findings: Secondary | ICD-10-CM | POA: Diagnosis not present

## 2022-04-13 DIAGNOSIS — I10 Essential (primary) hypertension: Secondary | ICD-10-CM | POA: Diagnosis not present

## 2022-04-13 DIAGNOSIS — R739 Hyperglycemia, unspecified: Secondary | ICD-10-CM | POA: Diagnosis not present

## 2022-04-13 DIAGNOSIS — Z1329 Encounter for screening for other suspected endocrine disorder: Secondary | ICD-10-CM | POA: Diagnosis not present

## 2022-04-13 DIAGNOSIS — Z6841 Body Mass Index (BMI) 40.0 and over, adult: Secondary | ICD-10-CM | POA: Diagnosis not present

## 2022-04-13 DIAGNOSIS — Z131 Encounter for screening for diabetes mellitus: Secondary | ICD-10-CM | POA: Diagnosis not present

## 2022-04-17 ENCOUNTER — Other Ambulatory Visit: Payer: Self-pay | Admitting: Physician Assistant

## 2022-04-17 DIAGNOSIS — Z1231 Encounter for screening mammogram for malignant neoplasm of breast: Secondary | ICD-10-CM

## 2022-04-27 DIAGNOSIS — M25562 Pain in left knee: Secondary | ICD-10-CM | POA: Diagnosis not present

## 2022-04-27 DIAGNOSIS — Z794 Long term (current) use of insulin: Secondary | ICD-10-CM | POA: Diagnosis not present

## 2022-04-27 DIAGNOSIS — N1832 Chronic kidney disease, stage 3b: Secondary | ICD-10-CM | POA: Diagnosis not present

## 2022-04-27 DIAGNOSIS — I10 Essential (primary) hypertension: Secondary | ICD-10-CM | POA: Diagnosis not present

## 2022-04-27 DIAGNOSIS — E1122 Type 2 diabetes mellitus with diabetic chronic kidney disease: Secondary | ICD-10-CM | POA: Diagnosis not present

## 2022-04-29 ENCOUNTER — Other Ambulatory Visit: Payer: Self-pay | Admitting: Cardiology

## 2022-04-29 NOTE — Telephone Encounter (Signed)
Rx(s) sent to pharmacy electronically.  

## 2022-05-14 ENCOUNTER — Encounter (HOSPITAL_COMMUNITY): Payer: Self-pay | Admitting: *Deleted

## 2022-06-10 ENCOUNTER — Ambulatory Visit
Admission: RE | Admit: 2022-06-10 | Discharge: 2022-06-10 | Disposition: A | Discharge status: Home / Self Care | Payer: BC Managed Care – PPO | Source: Ambulatory Visit | Attending: Physician Assistant | Admitting: Physician Assistant

## 2022-06-10 DIAGNOSIS — Z1231 Encounter for screening mammogram for malignant neoplasm of breast: Secondary | ICD-10-CM

## 2022-06-24 DIAGNOSIS — M170 Bilateral primary osteoarthritis of knee: Secondary | ICD-10-CM | POA: Diagnosis not present

## 2022-07-15 DIAGNOSIS — R051 Acute cough: Secondary | ICD-10-CM | POA: Diagnosis not present

## 2022-07-15 DIAGNOSIS — Z794 Long term (current) use of insulin: Secondary | ICD-10-CM | POA: Diagnosis not present

## 2022-07-15 DIAGNOSIS — N1832 Chronic kidney disease, stage 3b: Secondary | ICD-10-CM | POA: Diagnosis not present

## 2022-07-15 DIAGNOSIS — E1122 Type 2 diabetes mellitus with diabetic chronic kidney disease: Secondary | ICD-10-CM | POA: Diagnosis not present

## 2022-07-29 IMAGING — CT CT HEAD W/O CM
3 series · 16 of 47 positions shown, 19 images · non-contrast
Comparison: None.

CLINICAL DATA: Headache, intracranial hemorrhage suspected

EXAM:
CT HEAD WITHOUT CONTRAST
TECHNIQUE: Contiguous axial images were obtained from the base of the skull
through the vertex without intravenous contrast.

[Series 2: head wo · axial · 0.44mm/px · z∈[-165,-20]mm · 10 of 35 slices shown, 13 images]
[im 3/35  brain]
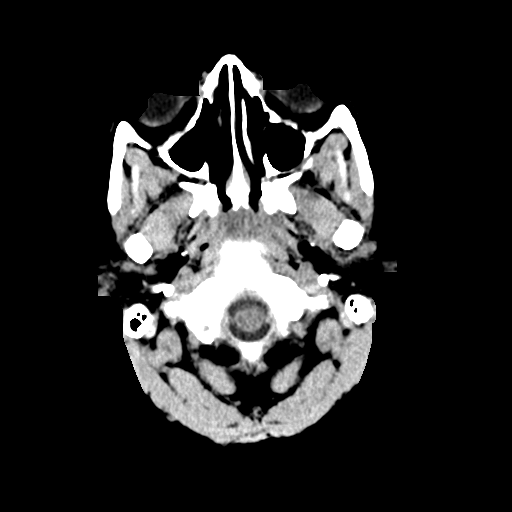
[im 3/35  bone]
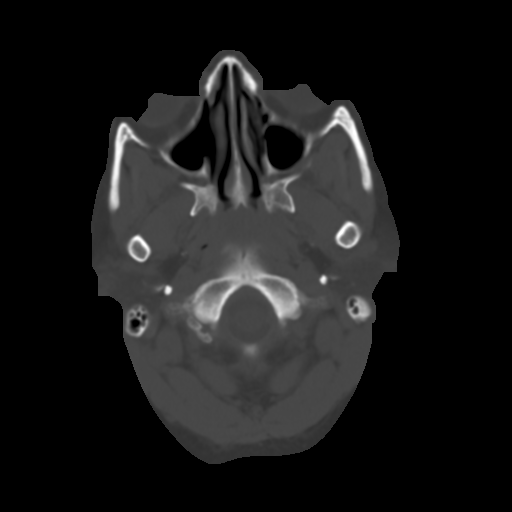
[im 6/35  brain]
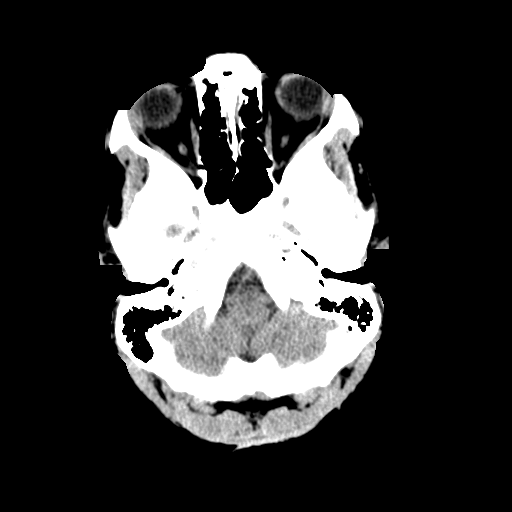
[im 10/35  brain]
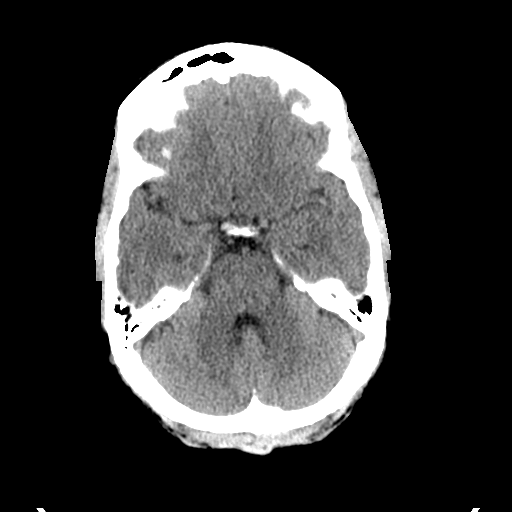
[im 12/35  brain]
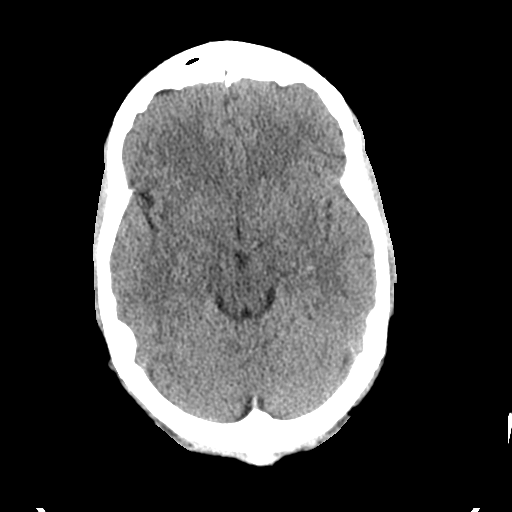
[im 16/35  brain]
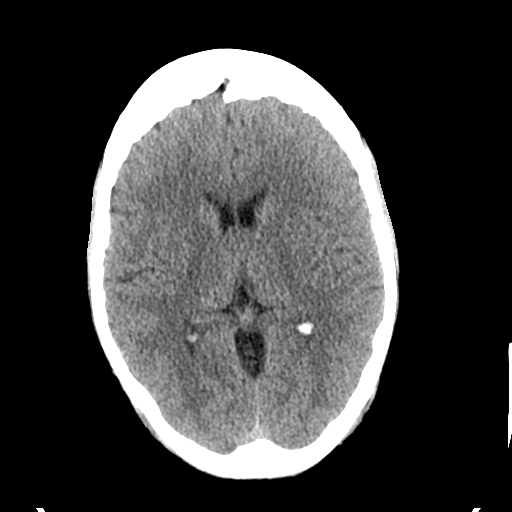
[im 16/35  bone]
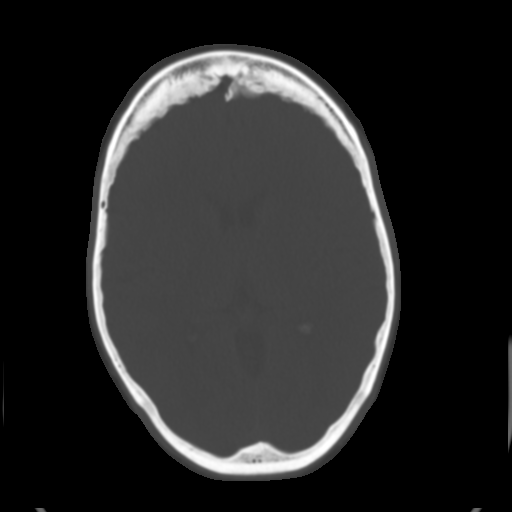
[im 19/35  brain]
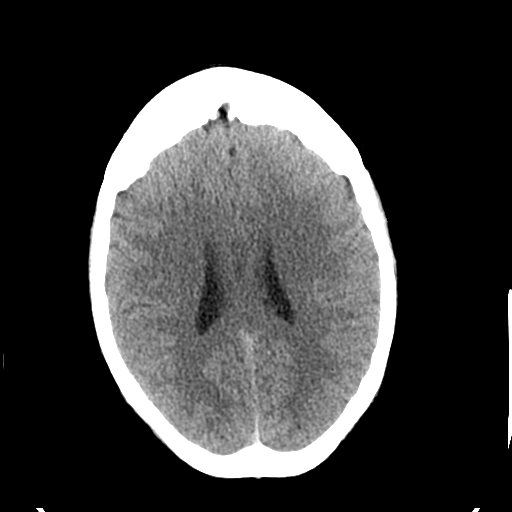
[im 23/35  brain]
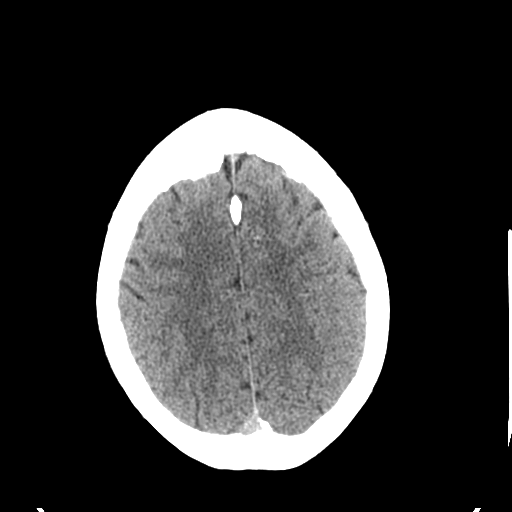
[im 26/35  brain]
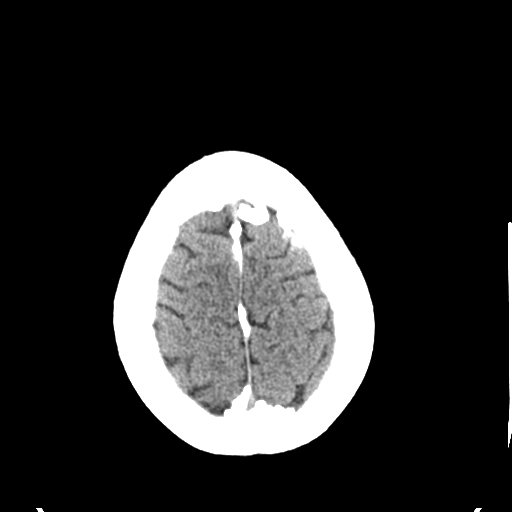
[im 29/35  brain]
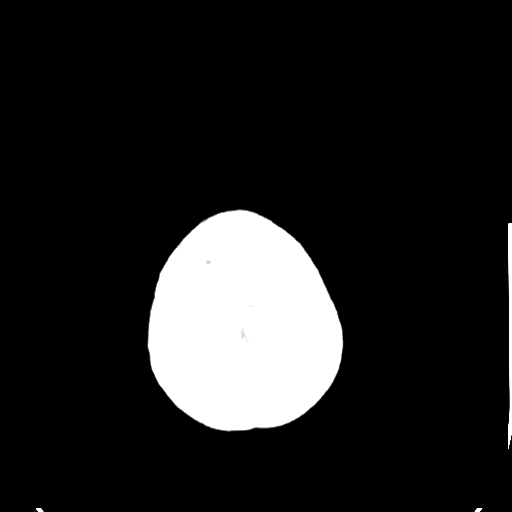
[im 29/35  bone]
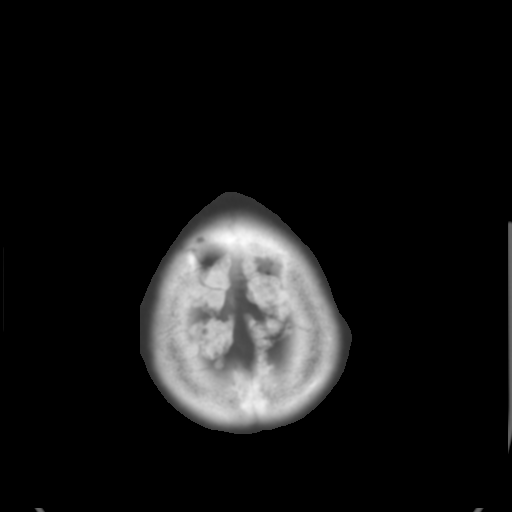
[im 32/35  brain]
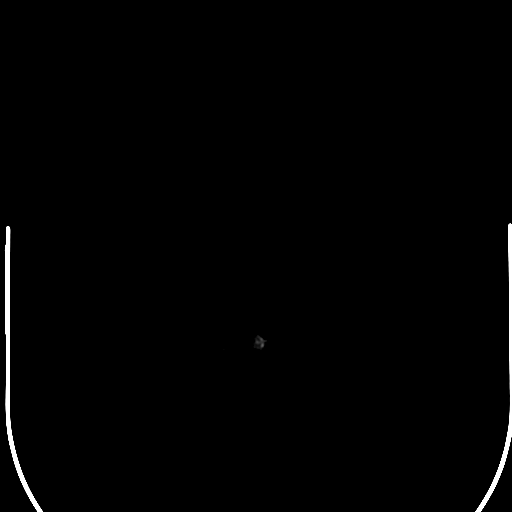

[Series 7: coronal soft tissue · coronal · 0.34mm/px · 3 of 72 slices shown]
[im 24/72  brain]
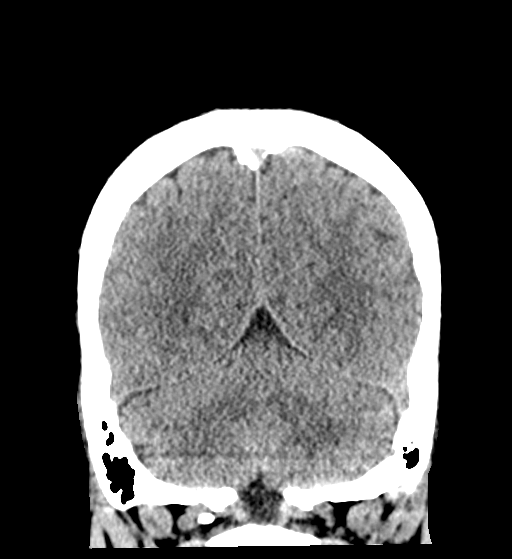
[im 32/72  brain]
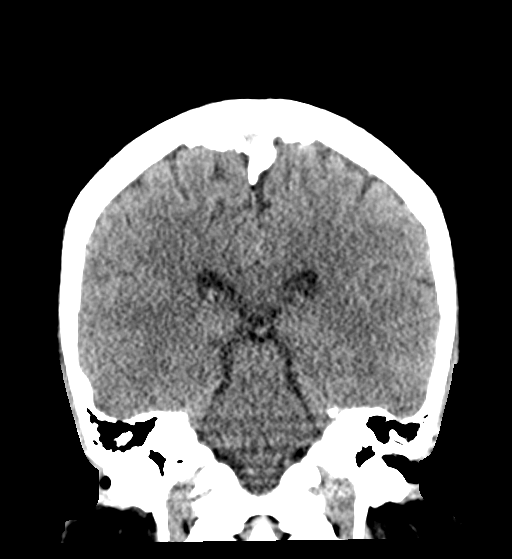
[im 40/72  brain]
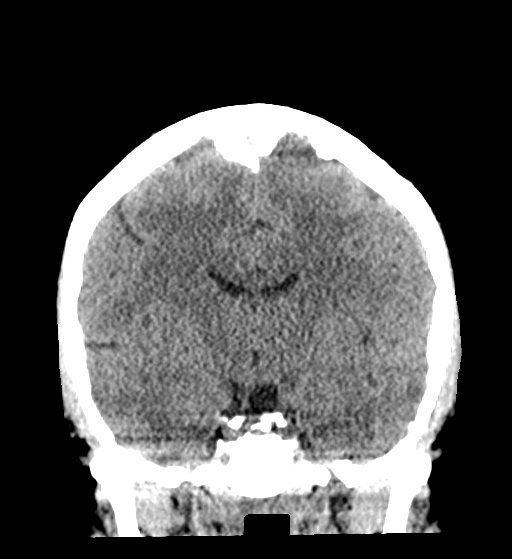

[Series 8: sagittal soft tissue · sagittal · 0.36mm/px · 3 of 56 slices shown]
[im 19/56  brain]
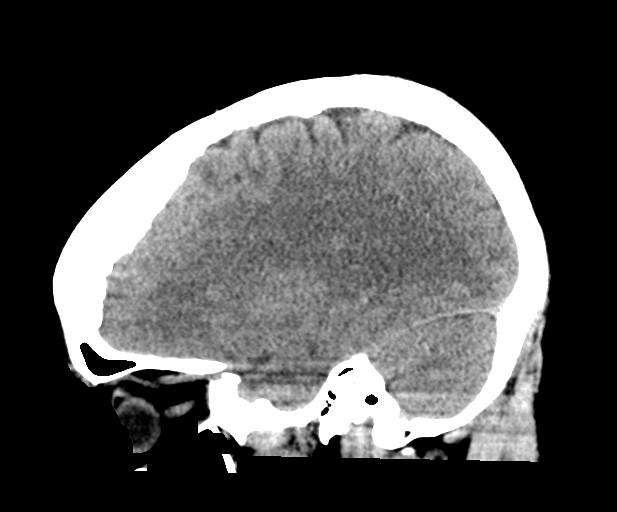
[im 28/56  brain]
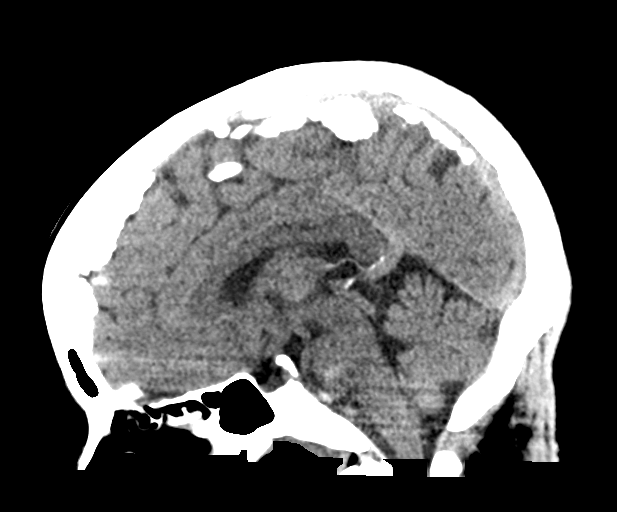
[im 37/56  brain]
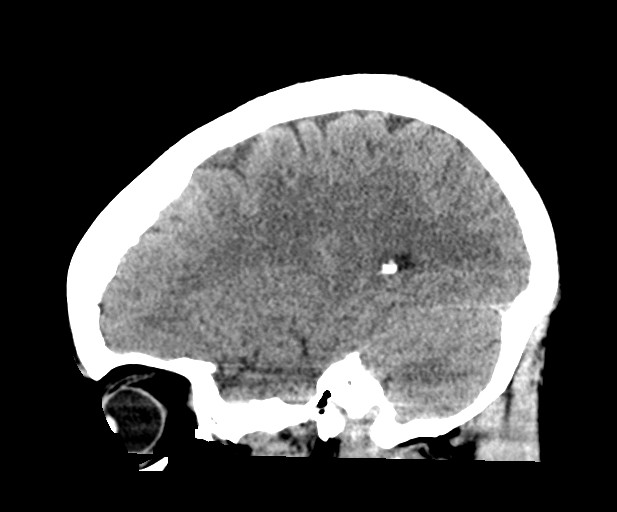

[16 of 47 positions shown; findings below may reference images not displayed]

FINDINGS: Brain: No evidence of acute territorial infarction, hemorrhage,
hydrocephalus,extra-axial collection or mass lesion/mass effect.
Normal gray-white differentiation. Ventricles are normal in size and
contour.

Vascular: No hyperdense vessel or unexpected calcification.

Skull: The skull is intact. No fracture or focal lesion identified.

Sinuses/Orbits: The visualized paranasal sinuses and mastoid air
cells are clear. The orbits and globes intact.

Other: None
IMPRESSION: No acute intracranial abnormality.

## 2022-08-21 DIAGNOSIS — M170 Bilateral primary osteoarthritis of knee: Secondary | ICD-10-CM | POA: Diagnosis not present

## 2022-08-21 DIAGNOSIS — M1712 Unilateral primary osteoarthritis, left knee: Secondary | ICD-10-CM | POA: Diagnosis not present

## 2022-08-21 DIAGNOSIS — Z6841 Body Mass Index (BMI) 40.0 and over, adult: Secondary | ICD-10-CM | POA: Diagnosis not present

## 2022-09-10 ENCOUNTER — Telehealth (HOSPITAL_BASED_OUTPATIENT_CLINIC_OR_DEPARTMENT_OTHER): Payer: Self-pay

## 2022-09-15 NOTE — Telephone Encounter (Signed)
error 

## 2022-09-23 NOTE — Progress Notes (Signed)
Cardiology Office Note:  .   Date:  09/23/2022  ID:  Dawn Braun, DOB 1966-03-01, MRN [id number] PCP: Georganna Skeans, MD  [facility] Providers Cardiologist:  Jodelle Red, MD {  History of Present Illness: [name]   Dawn Braun is a 57 y.o. female with a past medical history of prior SBO status post surgery, uncontrolled type 2 diabetes mellitus with history of DKA, paroxysmal atrial fibrillation followed by the A-fib clinic, asthma, stage III CKD with history of AKI, and obesity here for follow-up appointment.  Originally seen by Dr. Cristal Deer 05/01/2019 at the request of her primary care for evaluation management of paroxysmal atrial fibrillation.  Was last seen by Dr. Cristal Deer 06/2021 and at that time history included urgent care visit 07/02/2018 6:02 days of persistent lower extremity edema.  Was treated with 40 mg of Lasix x 5 days.  Followed up with Edd Fabian, NP 08/16/2020 and reported increased daytime somnolence with snoring.  Sleep study was ordered but not completed.  On 10/25/2020 presented to the ED with recurrent headaches for several weeks and hypertensive urgency (200s systolic).  Labs unremarkable other than hyperkalemia (potassium 5.2).  When she saw Dr. Cristal Deer for follow-up she was feeling good regarding her heart.  However, she is mostly concerned about her bilateral lower extremity edema.  At times she has been told that she looks swollen in her upper extremity and face as well.  Patient endorsed a tingling sensation in her feet.  This also occurs when she is driving.  Reported rapid weight gain.  When her diltiazem was increased she was unable to determine if her swelling had worsened.  She slept for most of the day prior weekend.  Started on Jardiance.  Insulin decreased.  Exercise recommended.  Today, she tells me that she is having some swelling in her lower legs.  Painful to touch.  Also feels she has swelling in her stomach.  Face is  puffy as well.  She is on Xarelto and not Eliquis.  She has been trying to limit her salt.  We discussed that some foods already have salt in them.  We also discussed increasing her HCTZ to help combat some of her fluid overload.  We can order some labs today for her as well as a ZIO and an echocardiogram to further workup her palpitations.  She does have a history of A-fib and feels like she is having episodes at home.  Dizziness, lightheadedness, and extreme fatigue.  She is not taking Jardiance because of the cost so organ to try to switch her to [place] to see if her insurance will cover that medication.  Reports no shortness of breath nor dyspnea on exertion. Reports no chest pain, pressure, or tightness. No orthopnea, PND.   ROS: Pertinent ROS in HPI  Studies Reviewed: [name]        EKG reviewed, normal sinus rhythm  Physical Exam:   VS:  LMP 12/24/2015 (Exact Date)    Wt Readings from Last 3 Encounters:  06/27/21 234 lb 4.8 oz (106.3 kg)  04/25/21 236 lb (107 kg)  02/14/21 231 lb (104.8 kg)    GEN: Well nourished, well developed in no acute distress NECK: No JVD; No carotid bruits CARDIAC: RRR, no murmurs, rubs, gallops RESPIRATORY:  Clear to auscultation without rales, wheezing or rhonchi  ABDOMEN: Soft, non-tender, non-distended EXTREMITIES:  No edema; No deformity   ASSESSMENT AND PLAN: .   1. LE edema, abdominal edema, palpitations -BNP, BMP, CBC, TSH,  mag  -Will order updated echocardiogram, last 04/2017 -Will plan to place a ZIO monitor on her today -Encouraged elevation of lower extremity and compression if needed  2. Essential hypertension -Diastolic elevated today at 90, systolic 130 -We plan to increase her Benicar HCTZ to 40-25 mg daily -Encouraged her to keep track of her weight  3.  Diabetes mellitus type 2 -A1c 8.6, uncontrolled -Will defer to her primary care for blood sugar control  4.  Paroxysmal atrial fibrillation -she has been lightheaded, dizzy and  tired -will order a zio monior and echo, labs as above -close follow-up -She is on Xarelto 20 mg daily for stroke protection     Dispo: She will follow-up with me in 4 to 6 weeks  Signed, Sharlene Dory, PA-C

## 2022-09-25 ENCOUNTER — Encounter: Payer: Self-pay | Admitting: Physician Assistant

## 2022-09-25 ENCOUNTER — Inpatient Hospital Stay (HOSPITAL_COMMUNITY)
Admission: EM | Admit: 2022-09-25 | Discharge: 2022-09-29 | DRG: 641 | Disposition: A | Discharge status: Home / Self Care | Payer: BC Managed Care – PPO | Attending: Family Medicine | Admitting: Family Medicine

## 2022-09-25 ENCOUNTER — Ambulatory Visit: Payer: BC Managed Care – PPO | Attending: Physician Assistant | Admitting: Physician Assistant

## 2022-09-25 ENCOUNTER — Telehealth: Payer: Self-pay | Admitting: Cardiology

## 2022-09-25 ENCOUNTER — Ambulatory Visit (INDEPENDENT_AMBULATORY_CARE_PROVIDER_SITE_OTHER): Payer: BC Managed Care – PPO

## 2022-09-25 VITALS — BP 130/90 | HR 78 | Ht 62.0 in | Wt 231.6 lb

## 2022-09-25 DIAGNOSIS — Z820 Family history of epilepsy and other diseases of the nervous system: Secondary | ICD-10-CM

## 2022-09-25 DIAGNOSIS — Z7901 Long term (current) use of anticoagulants: Secondary | ICD-10-CM

## 2022-09-25 DIAGNOSIS — I160 Hypertensive urgency: Secondary | ICD-10-CM | POA: Diagnosis not present

## 2022-09-25 DIAGNOSIS — Z7984 Long term (current) use of oral hypoglycemic drugs: Secondary | ICD-10-CM

## 2022-09-25 DIAGNOSIS — I129 Hypertensive chronic kidney disease with stage 1 through stage 4 chronic kidney disease, or unspecified chronic kidney disease: Secondary | ICD-10-CM | POA: Diagnosis not present

## 2022-09-25 DIAGNOSIS — Z841 Family history of disorders of kidney and ureter: Secondary | ICD-10-CM

## 2022-09-25 DIAGNOSIS — N1832 Chronic kidney disease, stage 3b: Secondary | ICD-10-CM | POA: Diagnosis not present

## 2022-09-25 DIAGNOSIS — Z7985 Long-term (current) use of injectable non-insulin antidiabetic drugs: Secondary | ICD-10-CM | POA: Diagnosis not present

## 2022-09-25 DIAGNOSIS — N183 Chronic kidney disease, stage 3 unspecified: Secondary | ICD-10-CM | POA: Diagnosis present

## 2022-09-25 DIAGNOSIS — Z79899 Other long term (current) drug therapy: Secondary | ICD-10-CM

## 2022-09-25 DIAGNOSIS — E669 Obesity, unspecified: Secondary | ICD-10-CM | POA: Diagnosis present

## 2022-09-25 DIAGNOSIS — N179 Acute kidney failure, unspecified: Secondary | ICD-10-CM | POA: Diagnosis present

## 2022-09-25 DIAGNOSIS — E1169 Type 2 diabetes mellitus with other specified complication: Secondary | ICD-10-CM | POA: Diagnosis not present

## 2022-09-25 DIAGNOSIS — E1122 Type 2 diabetes mellitus with diabetic chronic kidney disease: Secondary | ICD-10-CM | POA: Diagnosis present

## 2022-09-25 DIAGNOSIS — Z6841 Body Mass Index (BMI) 40.0 and over, adult: Secondary | ICD-10-CM | POA: Diagnosis not present

## 2022-09-25 DIAGNOSIS — J45909 Unspecified asthma, uncomplicated: Secondary | ICD-10-CM | POA: Diagnosis not present

## 2022-09-25 DIAGNOSIS — Z880 Allergy status to penicillin: Secondary | ICD-10-CM | POA: Diagnosis not present

## 2022-09-25 DIAGNOSIS — I480 Paroxysmal atrial fibrillation: Secondary | ICD-10-CM | POA: Diagnosis present

## 2022-09-25 DIAGNOSIS — R9431 Abnormal electrocardiogram [ECG] [EKG]: Secondary | ICD-10-CM | POA: Diagnosis present

## 2022-09-25 DIAGNOSIS — Z794 Long term (current) use of insulin: Secondary | ICD-10-CM | POA: Diagnosis not present

## 2022-09-25 DIAGNOSIS — R002 Palpitations: Secondary | ICD-10-CM | POA: Diagnosis not present

## 2022-09-25 DIAGNOSIS — I10 Essential (primary) hypertension: Secondary | ICD-10-CM

## 2022-09-25 DIAGNOSIS — Z833 Family history of diabetes mellitus: Secondary | ICD-10-CM | POA: Diagnosis not present

## 2022-09-25 DIAGNOSIS — Z7189 Other specified counseling: Secondary | ICD-10-CM

## 2022-09-25 DIAGNOSIS — R110 Nausea: Secondary | ICD-10-CM | POA: Diagnosis not present

## 2022-09-25 DIAGNOSIS — E875 Hyperkalemia: Principal | ICD-10-CM | POA: Diagnosis present

## 2022-09-25 DIAGNOSIS — Z9049 Acquired absence of other specified parts of digestive tract: Secondary | ICD-10-CM | POA: Diagnosis not present

## 2022-09-25 DIAGNOSIS — R112 Nausea with vomiting, unspecified: Secondary | ICD-10-CM | POA: Diagnosis present

## 2022-09-25 DIAGNOSIS — E66813 Obesity, class 3: Secondary | ICD-10-CM | POA: Diagnosis present

## 2022-09-25 DIAGNOSIS — D6869 Other thrombophilia: Secondary | ICD-10-CM

## 2022-09-25 DIAGNOSIS — Z8249 Family history of ischemic heart disease and other diseases of the circulatory system: Secondary | ICD-10-CM

## 2022-09-25 LAB — CBC
HCT: 32.4 % — ABNORMAL LOW (ref 36.0–46.0)
Hemoglobin: 10.1 g/dL — ABNORMAL LOW (ref 12.0–15.0)
MCH: 28.1 pg (ref 26.0–34.0)
MCHC: 31.2 g/dL (ref 30.0–36.0)
MCV: 90.3 fL (ref 80.0–100.0)
Platelets: 307 10*3/uL (ref 150–400)
RBC: 3.59 MIL/uL — ABNORMAL LOW (ref 3.87–5.11)
RDW: 13.2 % (ref 11.5–15.5)
WBC: 8.6 10*3/uL (ref 4.0–10.5)
nRBC: 0 % (ref 0.0–0.2)

## 2022-09-25 LAB — BASIC METABOLIC PANEL
Anion gap: 8 (ref 5–15)
BUN: 46 mg/dL — ABNORMAL HIGH (ref 6–20)
CO2: 17 mmol/L — ABNORMAL LOW (ref 22–32)
Calcium: 8.5 mg/dL — ABNORMAL LOW (ref 8.9–10.3)
Chloride: 112 mmol/L — ABNORMAL HIGH (ref 98–111)
Creatinine, Ser: 1.72 mg/dL — ABNORMAL HIGH (ref 0.44–1.00)
GFR, Estimated: 34 mL/min — ABNORMAL LOW (ref >60)
Glucose, Bld: 142 mg/dL — ABNORMAL HIGH (ref 70–99)
Potassium: 5.8 mmol/L — ABNORMAL HIGH (ref 3.5–5.1)
Sodium: 137 mmol/L (ref 135–145)

## 2022-09-25 LAB — TROPONIN I (HIGH SENSITIVITY): Troponin I (High Sensitivity): 6 ng/L (ref <18)

## 2022-09-25 MED ORDER — OLMESARTAN MEDOXOMIL-HCTZ 40-25 MG PO TABS: 1.0000 | ORAL_TABLET | Freq: Every day | ORAL | 3 refills | Status: DC

## 2022-09-25 MED ORDER — DAPAGLIFLOZIN PROPANEDIOL 10 MG PO TABS: 10.0000 mg | ORAL_TABLET | Freq: Every day | ORAL | 11 refills | Status: DC

## 2022-09-25 MED ORDER — DEXTROSE 50 % IV SOLN
1.0000 | Freq: Once | INTRAVENOUS | Status: AC
  Administered 2022-09-26: 50 mL via INTRAVENOUS
  Filled 2022-09-25: qty 50

## 2022-09-25 MED ORDER — FUROSEMIDE 10 MG/ML IJ SOLN
40.0000 mg | Freq: Once | INTRAMUSCULAR | Status: AC
  Administered 2022-09-26: 40 mg via INTRAVENOUS
  Filled 2022-09-25: qty 4

## 2022-09-25 MED ORDER — ALBUTEROL SULFATE (2.5 MG/3ML) 0.083% IN NEBU
10.0000 mg | INHALATION_SOLUTION | Freq: Once | RESPIRATORY_TRACT | Status: AC
  Administered 2022-09-26: 10 mg via RESPIRATORY_TRACT
  Filled 2022-09-25: qty 12

## 2022-09-25 MED ORDER — SODIUM ZIRCONIUM CYCLOSILICATE 10 G PO PACK
10.0000 g | PACK | Freq: Once | ORAL | Status: AC
  Administered 2022-09-26: 10 g via ORAL
  Filled 2022-09-25: qty 1

## 2022-09-25 MED ORDER — INSULIN ASPART 100 UNIT/ML IV SOLN
5.0000 [IU] | Freq: Once | INTRAVENOUS | Status: AC
  Administered 2022-09-26: 5 [IU] via INTRAVENOUS
  Filled 2022-09-25: qty 0.05

## 2022-09-25 NOTE — ED Provider Notes (Signed)
[place] EMERGENCY DEPARTMENT AT [facility] Provider Note   CSN: [id number] Arrival date & time: 09/25/22  1956     History {Add pertinent medical, surgical, social history, OB history to HPI:1} Chief Complaint  Patient presents with   Abnormal Lab    Dawn Braun is a 57 y.o. female.  57 year old female with past medical history of CKD, diabetes, SBO, paroxysmal A-fib on Xarelto presents with report of hyperkalemia from outpatient labs obtained today.  Patient is otherwise feeling well.  She is wearing a Zio patch for concerns for palpitations.       Home Medications Prior to Admission medications   Medication Sig Start Date End Date Taking? Authorizing Provider  apixaban (ELIQUIS) 5 MG TABS tablet     [provider]  atorvastatin (LIPITOR) 40 MG tablet Take 1 tablet by mouth daily.    [provider]  cyclobenzaprine (FLEXERIL) 10 MG tablet Take 1 tablet (10 mg total) by mouth at bedtime. 01/16/21   Georganna Skeans, MD  dapagliflozin propanediol (FARXIGA) 10 MG TABS tablet Take 1 tablet (10 mg total) by mouth daily before breakfast. 09/25/22   Sharlene Dory, PA-C  DILT-XR 180 MG 24 hr capsule Take 1 capsule by mouth once daily 09/30/21   Jodelle Red, MD  Dulaglutide (TRULICITY) 1.5 MG/0.5ML SOPN Inject 1.5 mg into the skin once a week. 01/28/21   Hoy Register, MD  empagliflozin (JARDIANCE) 10 MG TABS tablet Take 1 tablet (10 mg total) by mouth daily before breakfast. 06/27/21   Jodelle Red, MD  famotidine (PEPCID) 20 MG tablet Take 1 tablet (20 mg total) by mouth 2 (two) times daily. AM and bedtime 03/11/20   Iva Boop, MD  flecainide [facility]) 100 MG tablet Take 1 tablet by mouth twice daily 04/29/22   Jodelle Red, MD  glimepiride (AMARYL) 4 MG tablet Take 1 tablet by mouth daily.    [provider]  insulin isophane & regular human (NOVOLIN 70/30 FLEXPEN) (70-30) 100 UNIT/ML KwikPen Inject 9  Units into the skin 2 (two) times daily. 08/03/19 10/31/20  Anders Simmonds, PA-C  Insulin Pen Needle 31G X 4 MM MISC 1 Container by Does not apply route 2 (two) times daily. 08/03/19   Anders Simmonds, PA-C  lisinopril (ZESTRIL) 40 MG tablet Take 1 tablet by mouth once daily 07/25/21   Georganna Skeans, MD  loratadine (CLARITIN) 10 MG tablet Take 10 mg by mouth daily. 02/17/20   [provider]  meloxicam (MOBIC) 15 MG tablet as needed.    [provider]  metFORMIN (GLUCOPHAGE) 500 MG tablet Take 1 tablet (500 mg total) by mouth 2 (two) times daily with a meal. 01/28/21   Newlin, Enobong, MD  montelukast (SINGULAIR) 10 MG tablet Take 10 mg by mouth daily. 02/20/20   [provider]  olmesartan-hydrochlorothiazide (BENICAR HCT) 40-25 MG tablet Take 1 tablet by mouth daily. 09/25/22   Sharlene Dory, PA-C  ondansetron (ZOFRAN ODT) 4 MG disintegrating tablet Take 1 tablet (4 mg total) by mouth every 8 (eight) hours as needed for nausea or vomiting. 05/12/20   Pollyann Savoy, MD  XARELTO 20 MG TABS tablet TAKE 1 TABLET BY MOUTH ONCE DAILY WITH  SUPPER 02/06/20   Fulp, Cammie, MD      Allergies    Amoxicillin    Review of Systems   Review of Systems Negative except as per HPI Physical Exam Updated Vital Signs BP (!) 176/92   Pulse 83  Temp 98.6 F (37 C)   Resp 11   LMP 12/24/2015 (Exact Date)   SpO2 100%  Physical Exam Vitals and nursing note reviewed.  Constitutional:      General: She is not in acute distress.    Appearance: She is well-developed. She is not diaphoretic.  HENT:     Head: Normocephalic and atraumatic.  Cardiovascular:     Rate and Rhythm: Normal rate and regular rhythm.     Heart sounds: Normal heart sounds.  Pulmonary:     Effort: Pulmonary effort is normal.     Breath sounds: Normal breath sounds.  Musculoskeletal:     Right lower leg: Edema present.     Left lower leg: Edema present.     Comments: Minimal pitting edema to  bilateral lower extremities.  Skin:    General: Skin is warm and dry.  Neurological:     Mental Status: She is alert and oriented to person, place, and time.  Psychiatric:        Behavior: Behavior normal.     ED Results / Procedures / Treatments   Labs (all labs ordered are listed, but only abnormal results are displayed) Labs Reviewed  BASIC METABOLIC PANEL - Abnormal; Notable for the following components:      Result Value   Potassium 5.8 (*)    Chloride 112 (*)    CO2 17 (*)    Glucose, Bld 142 (*)    BUN 46 (*)    Creatinine, Ser 1.72 (*)    Calcium 8.5 (*)    GFR, Estimated 34 (*)    All other components within normal limits  CBC - Abnormal; Notable for the following components:   RBC 3.59 (*)    Hemoglobin 10.1 (*)    HCT 32.4 (*)    All other components within normal limits  TROPONIN I (HIGH SENSITIVITY)  TROPONIN I (HIGH SENSITIVITY)    EKG EKG Interpretation  Date/Time:  Friday September 25 2022 20:38:52 EDT Ventricular Rate:  84 PR Interval:  187 QRS Duration: 100 QT Interval:  367 QTC Calculation: 434 R Axis:   52 Text Interpretation: Sinus rhythm Low voltage, precordial leads No significant change since last tracing Confirmed by Melene Plan [phone]) on 09/25/2022 11:32:28 PM  Radiology No results found.  Procedures Procedures  {Document cardiac monitor, telemetry assessment procedure when appropriate:1}  Medications Ordered in ED Medications  furosemide (LASIX) injection 40 mg (has no administration in time range)  sodium zirconium cyclosilicate (LOKELMA) packet 10 g (has no administration in time range)  albuterol (PROVENTIL) (2.5 MG/3ML) 0.083% nebulizer solution 10 mg (has no administration in time range)  insulin aspart (novoLOG) injection 5 Units (has no administration in time range)    And  dextrose 50 % solution 50 mL (has no administration in time range)    ED Course/ Medical Decision Making/ A&P   {   Click here for ABCD2, HEART and  other calculatorsREFRESH Note before signing :1}                          Medical Decision Making Amount and/or Complexity of Data Reviewed Labs: ordered.  Risk OTC drugs. Prescription drug management.   This patient presents to the ED for concern of ***, this involves an extensive number of treatment options, and is a complaint that carries with it a high risk of complications and morbidity.  The differential diagnosis includes ***   Co morbidities that complicate  the patient evaluation  ***   Additional history obtained:  External records from outside source obtained and reviewed including note from Cardiology clinic visit today, Benicar hydrochlorothiazide increased to 40-25. Also on ACE. Zio for feeling lightheaded/dizzy/tired.  BMP with K 6.2, similar to prior from last year   Lab Tests:  I Ordered, and personally interpreted labs.  The pertinent results include:  BMP with K 5.8, CO2 17, glucose 142, gap normal. Cr 1.72. trop 6. CBC with mild anemia with hbg 10.1 (down from 12.1 1 year ago).    Imaging Studies ordered:  I ordered imaging studies including ***  I independently visualized and interpreted imaging which showed *** I agree with the radiologist interpretation   Cardiac Monitoring: / EKG:  The patient was maintained on a cardiac monitor.  I personally viewed and interpreted the cardiac monitored which showed an underlying rhythm of: ***   Consultations Obtained:  I requested consultation with the Dr. Adela Lank, ER attending,  and discussed lab and imaging findings as well as pertinent plan - they recommend: hyper K management as ordered. Possibly dc home if K improved, consult hospitalist to discuss med change- on ACE and ARB.    Problem List / ED Course / Critical interventions / Medication management  *** I ordered medication including ***  for ***  Reevaluation of the patient after these medicines showed that the patient  {resolved/improved/worsened:23923::"improved"} I have reviewed the patients home medicines and have made adjustments as needed   Social Determinants of Health:  ***   Test / Admission - Considered:  ***   {Document critical care time when appropriate:1} {Document review of labs and clinical decision tools ie heart score, Chads2Vasc2 etc:1}  {Document your independent review of radiology images, and any outside records:1} {Document your discussion with family members, caretakers, and with consultants:1} {Document social determinants of health affecting pt's care:1} {Document your decision making why or why not admission, treatments were needed:1} Final Clinical Impression(s) / ED Diagnoses Final diagnoses:  None    Rx / DC Orders ED Discharge Orders     None

## 2022-09-25 NOTE — Telephone Encounter (Signed)
Received outpatient call from [facility] regarding patient critical potassium of 6.2 done in the office today around 10:30 AM.  Attempted to call the patient x 2 with call forwared straight to voicemail.  Did speak with patient's daughter who had the patient call me back.  I advised she would need to be seen urgently in the ED with this elevated potassium given concern for cardiac arrhythmias. She reports she has already taken her Benicar/HCTZ for the day.  She states she has been having some palpitations and felt generally fatigued for the past couple of days as noted in her office note today as well.  She voiced understanding and stated she would go to closest ED which would be [facility].  Thanked me for call back.

## 2022-09-25 NOTE — Progress Notes (Unsigned)
ZIO XT serial # [id number] from office inventory applied to patient.  Dr. Jodelle Red to read.

## 2022-09-25 NOTE — ED Triage Notes (Signed)
Patient here from home reporting call from dr. Reporting elevated potassium level and increased kidney function.

## 2022-09-25 NOTE — Patient Instructions (Addendum)
Medication Instructions:  1.Increase olmesartan-hydrochlorothiazide to 40-25 mg daily. 2.Start farxiga 10 mg daily before breakfast. *If you need a refill on your cardiac medications before your next appointment, please call your pharmacy*   Lab Work: BMET, MAG, BNP, CBC, TSH-TODAY If you have labs (blood work) drawn today and your tests are completely normal, you will receive your results only by: MyChart Message (if you have MyChart) OR A paper copy in the mail If you have any lab test that is abnormal or we need to change your treatment, we will call you to review the results.   Testing/Procedures: Your physician has requested that you have an echocardiogram. Echocardiography is a painless test that uses sound waves to create images of your heart. It provides your doctor with information about the size and shape of your heart and how well your heart's chambers and valves are working. This procedure takes approximately one hour. There are no restrictions for this procedure. Please do NOT wear cologne, perfume, aftershave, or lotions (deodorant is allowed). Please arrive 15 minutes prior to your appointment time.   ZIO XT- Long Term Monitor Instructions  Your physician has requested you wear a ZIO patch monitor for 14 days.  This is a single patch monitor. [employer] supplies one patch monitor per enrollment. Additional stickers are not available. Please do not apply patch if you will be having a Nuclear Stress Test,  Echocardiogram, Cardiac CT, MRI, or Chest Xray during the period you would be wearing the  monitor. The patch cannot be worn during these tests. You cannot remove and re-apply the  ZIO XT patch monitor.  Your ZIO patch monitor will be mailed 3 day USPS to your address on file. It may take 3-5 days  to receive your monitor after you have been enrolled.  Once you have received your monitor, please review the enclosed instructions. Your monitor  has already been registered  assigning a specific monitor serial # to you.  Billing and Patient Assistance Program Information  We have supplied [employer] with any of your insurance information on file for billing purposes. [employer] offers a sliding scale Patient Assistance Program for patients that do not have  insurance, or whose insurance does not completely cover the cost of the ZIO monitor.  You must apply for the Patient Assistance Program to qualify for this discounted rate.  To apply, please call [employer] at [phone], select option 4, select option 2, ask to apply for  Patient Assistance Program. Meredeth Ide will ask your household income, and how many people  are in your household. They will quote your out-of-pocket cost based on that information.  [employer] will also be able to set up a 62-month, interest-free payment plan if needed.  Applying the monitor   Shave hair from upper left chest.  Hold abrader disc by orange tab. Rub abrader in 40 strokes over the upper left chest as  indicated in your monitor instructions.  Clean area with 4 enclosed alcohol pads. Let dry.  Apply patch as indicated in monitor instructions. Patch will be placed under collarbone on left  side of chest with arrow pointing upward.  Rub patch adhesive wings for 2 minutes. Remove white label marked "1". Remove the white  label marked "2". Rub patch adhesive wings for 2 additional minutes.  While looking in a mirror, press and release button in center of patch. A small green light will  flash 3-4 times. This will be your only indicator that the monitor has been turned on.  Do not shower for the first 24 hours. You may shower after the first 24 hours.  Press the button if you feel a symptom. You will hear a small click. Record Date, Time and  Symptom in the Patient Logbook.  When you are ready to remove the patch, follow instructions on the last 2 pages of Patient  Logbook. Stick patch monitor onto the last page of Patient Logbook.  Place  Patient Logbook in the blue and white box. Use locking tab on box and tape box closed  securely. The blue and white box has prepaid postage on it. Please place it in the mailbox as  soon as possible. Your physician should have your test results approximately 7 days after the  monitor has been mailed back to [facility].  Call [facility] Customer Care at [phone] if you have questions regarding  your ZIO XT patch monitor. Call them immediately if you see an orange light blinking on your  monitor.  If your monitor falls off in less than 4 days, contact our Monitor department at [phone].  If your monitor becomes loose or falls off after 4 days call [employer] at [phone] for  suggestions on securing your monitor   Follow-Up: At [facility], you and your health needs are our priority.  As part of our continuing mission to provide you with exceptional heart care, we have created designated Provider Care Teams.  These Care Teams include your primary Cardiologist (physician) and Advanced Practice Providers (APPs -  Physician Assistants and Nurse Practitioners) who all work together to provide you with the care you need, when you need it.    Your next appointment:   4-6 week(s)  Provider:   Jari Favre, PA-C       Low-Sodium Eating Plan Salt (sodium) helps you keep a healthy balance of fluids in your body. Too much sodium can raise your blood pressure. It can also cause fluid and waste to be held in your body. Your health care provider or dietitian may recommend a low-sodium eating plan if you have high blood pressure (hypertension), kidney disease, liver disease, or heart failure. Eating less sodium can help lower your blood pressure and reduce swelling. It can also protect your heart, liver, and kidneys. What are tips for following this plan? Reading food labels  Check food labels for the amount of sodium per serving. If you eat more than one serving, you must  multiply the listed amount by the number of servings. Choose foods with less than 140 milligrams (mg) of sodium per serving. Avoid foods with 300 mg of sodium or more per serving. Always check how much sodium is in a product, even if the label says "unsalted" or "no salt added." Shopping  Buy products labeled as "low-sodium" or "no salt added." Buy fresh foods. Avoid canned foods and pre-made or frozen meals. Avoid canned, cured, or processed meats. Buy breads that have less than 80 mg of sodium per slice. Cooking  Eat more home-cooked food. Try to eat less restaurant, buffet, and fast food. Try not to add salt when you cook. Use salt-free seasonings or herbs instead of table salt or sea salt. Check with your provider or pharmacist before using salt substitutes. Cook with plant-based oils, such as canola, sunflower, or olive oil. Meal planning When eating at a restaurant, ask if your food can be made with less salt or no salt. Avoid dishes labeled as brined, pickled, cured, or smoked. Avoid dishes made with soy sauce,  miso, or teriyaki sauce. Avoid foods that have monosodium glutamate (MSG) in them. MSG may be added to some restaurant food, sauces, soups, bouillon, and canned foods. Make meals that can be grilled, baked, poached, roasted, or steamed. These are often made with less sodium. General information Try to limit your sodium intake to 1,500-2,300 mg each day, or the amount told by your provider. What foods should I eat? Fruits Fresh, frozen, or canned fruit. Fruit juice. Vegetables Fresh or frozen vegetables. "No salt added" canned vegetables. "No salt added" tomato sauce and paste. Low-sodium or reduced-sodium tomato and vegetable juice. Grains Low-sodium cereals, such as oats, puffed wheat and rice, and shredded wheat. Low-sodium crackers. Unsalted rice. Unsalted pasta. Low-sodium bread. Whole grain breads and whole grain pasta. Meats and other proteins Fresh or frozen meat,  poultry, seafood, and fish. These should have no added salt. Low-sodium canned tuna and salmon. Unsalted nuts. Dried peas, beans, and lentils without added salt. Unsalted canned beans. Eggs. Unsalted nut butters. Dairy Milk. Soy milk. Cheese that is naturally low in sodium, such as ricotta cheese, fresh mozzarella, or Swiss cheese. Low-sodium or reduced-sodium cheese. Cream cheese. Yogurt. Seasonings and condiments Fresh and dried herbs and spices. Salt-free seasonings. Low-sodium mustard and ketchup. Sodium-free salad dressing. Sodium-free light mayonnaise. Fresh or refrigerated horseradish. Lemon juice. Vinegar. Other foods Homemade, reduced-sodium, or low-sodium soups. Unsalted popcorn and pretzels. Low-salt or salt-free chips. The items listed above may not be all the foods and drinks you can have. Talk to a dietitian to learn more. What foods should I avoid? Vegetables Sauerkraut, pickled vegetables, and relishes. Olives. [place] fries. Onion rings. Regular canned vegetables, except low-sodium or reduced-sodium items. Regular canned tomato sauce and paste. Regular tomato and vegetable juice. Frozen vegetables in sauces. Grains Instant hot cereals. Bread stuffing, pancake, and biscuit mixes. Croutons. Seasoned rice or pasta mixes. Noodle soup cups. Boxed or frozen macaroni and cheese. Regular salted crackers. Self-rising flour. Meats and other proteins Meat or fish that is salted, canned, smoked, spiced, or pickled. Precooked or cured meat, such as sausages or meat loaves. Tomasa Blase. Ham. Pepperoni. Hot dogs. Corned beef. Chipped beef. Salt pork. Jerky. Pickled herring, anchovies, and sardines. Regular canned tuna. Salted nuts. Dairy Processed cheese and cheese spreads. Hard cheeses. Cheese curds. Blue cheese. Feta cheese. String cheese. Regular cottage cheese. Buttermilk. Canned milk. Fats and oils Salted butter. Regular margarine. Ghee. Bacon fat. Seasonings and condiments Onion salt, garlic  salt, seasoned salt, table salt, and sea salt. Canned and packaged gravies. Worcestershire sauce. Tartar sauce. Barbecue sauce. Teriyaki sauce. Soy sauce, including reduced-sodium soy sauce. Steak sauce. Fish sauce. Oyster sauce. Cocktail sauce. Horseradish that you find on the shelf. Regular ketchup and mustard. Meat flavorings and tenderizers. Bouillon cubes. Hot sauce. Pre-made or packaged marinades. Pre-made or packaged taco seasonings. Relishes. Regular salad dressings. Salsa. Other foods Salted popcorn and pretzels. Corn chips and puffs. Potato and tortilla chips. Canned or dried soups. Pizza. Frozen entrees and pot pies. The items listed above may not be all the foods and drinks you should avoid. Talk to a dietitian to learn more. This information is not intended to replace advice given to you by your health care provider. Make sure you discuss any questions you have with your health care provider. Document Revised: 04/09/2022 Document Reviewed: 04/09/2022 Elsevier Patient Education  2024 [employer] Heart-Healthy Eating Plan Many factors influence your heart health, including eating and exercise habits. Heart health is also called coronary health. Coronary risk increases with abnormal blood  fat (lipid) levels. A heart-healthy eating plan includes limiting unhealthy fats, increasing healthy fats, limiting salt (sodium) intake, and making other diet and lifestyle changes. What is my plan? Your health care provider may recommend that: You limit your fat intake to _________% or less of your total calories each day. You limit your saturated fat intake to _________% or less of your total calories each day. You limit the amount of cholesterol in your diet to less than _________ mg per day. You limit the amount of sodium in your diet to less than _________ mg per day. What are tips for following this plan? Cooking Cook foods using methods other than frying. Baking, boiling, grilling, and broiling  are all good options. Other ways to reduce fat include: Removing the skin from poultry. Removing all visible fats from meats. Steaming vegetables in water or broth. Meal planning  At meals, imagine dividing your plate into fourths: Fill one-half of your plate with vegetables and green salads. Fill one-fourth of your plate with whole grains. Fill one-fourth of your plate with lean protein foods. Eat 2-4 cups of vegetables per day. One cup of vegetables equals 1 cup (91 g) broccoli or cauliflower florets, 2 medium carrots, 1 large bell pepper, 1 large sweet potato, 1 large tomato, 1 medium white potato, 2 cups (150 g) raw leafy greens. Eat 1-2 cups of fruit per day. One cup of fruit equals 1 small apple, 1 large banana, 1 cup (237 g) mixed fruit, 1 large orange,  cup (82 g) dried fruit, 1 cup (240 mL) 100% fruit juice. Eat more foods that contain soluble fiber. Examples include apples, broccoli, carrots, beans, peas, and barley. Aim to get 25-30 g of fiber per day. Increase your consumption of legumes, nuts, and seeds to 4-5 servings per week. One serving of dried beans or legumes equals  cup (90 g) cooked, 1 serving of nuts is  oz (12 almonds, 24 pistachios, or 7 walnut halves), and 1 serving of seeds equals  oz (8 g). Fats Choose healthy fats more often. Choose monounsaturated and polyunsaturated fats, such as olive and canola oils, avocado oil, flaxseeds, walnuts, almonds, and seeds. Eat more omega-3 fats. Choose salmon, mackerel, sardines, tuna, flaxseed oil, and ground flaxseeds. Aim to eat fish at least 2 times each week. Check food labels carefully to identify foods with trans fats or high amounts of saturated fat. Limit saturated fats. These are found in animal products, such as meats, butter, and cream. Plant sources of saturated fats include palm oil, palm kernel oil, and coconut oil. Avoid foods with partially hydrogenated oils in them. These contain trans fats. Examples are stick  margarine, some tub margarines, cookies, crackers, and other baked goods. Avoid fried foods. General information Eat more home-cooked food and less restaurant, buffet, and fast food. Limit or avoid alcohol. Limit foods that are high in added sugar and simple starches such as foods made using white refined flour (white breads, pastries, sweets). Lose weight if you are overweight. Losing just 5-10% of your body weight can help your overall health and prevent diseases such as diabetes and heart disease. Monitor your sodium intake, especially if you have high blood pressure. Talk with your health care provider about your sodium intake. Try to incorporate more vegetarian meals weekly. What foods should I eat? Fruits All fresh, canned (in natural juice), or frozen fruits. Vegetables Fresh or frozen vegetables (raw, steamed, roasted, or grilled). Green salads. Grains Most grains. Choose whole wheat and whole grains most  of the time. Rice and pasta, including brown rice and pastas made with whole wheat. Meats and other proteins Lean, well-trimmed beef, veal, pork, and lamb. Chicken and [place] without skin. All fish and shellfish. Wild duck, rabbit, pheasant, and venison. Egg whites or low-cholesterol egg substitutes. Dried beans, peas, lentils, and tofu. Seeds and most nuts. Dairy Low-fat or nonfat cheeses, including ricotta and mozzarella. Skim or 1% milk (liquid, powdered, or evaporated). Buttermilk made with low-fat milk. Nonfat or low-fat yogurt. Fats and oils Non-hydrogenated (trans-free) margarines. Vegetable oils, including soybean, sesame, sunflower, olive, avocado, peanut, safflower, corn, canola, and cottonseed. Salad dressings or mayonnaise made with a vegetable oil. Beverages Water (mineral or sparkling). Coffee and tea. Unsweetened ice tea. Diet beverages. Sweets and desserts Sherbet, gelatin, and fruit ice. Small amounts of dark chocolate. Limit all sweets and desserts. Seasonings  and condiments All seasonings and condiments. The items listed above may not be a complete list of foods and beverages you can eat. Contact a dietitian for more options. What foods should I avoid? Fruits Canned fruit in heavy syrup. Fruit in cream or butter sauce. Fried fruit. Limit coconut. Vegetables Vegetables cooked in cheese, cream, or butter sauce. Fried vegetables. Grains Breads made with saturated or trans fats, oils, or whole milk. Croissants. Sweet rolls. Donuts. High-fat crackers, such as cheese crackers and chips. Meats and other proteins Fatty meats, such as hot dogs, ribs, sausage, bacon, rib-eye roast or steak. High-fat deli meats, such as salami and bologna. Caviar. Domestic duck and goose. Organ meats, such as liver. Dairy Cream, sour cream, cream cheese, and creamed cottage cheese. Whole-milk cheeses. Whole or 2% milk (liquid, evaporated, or condensed). Whole buttermilk. Cream sauce or high-fat cheese sauce. Whole-milk yogurt. Fats and oils Meat fat, or shortening. Cocoa butter, hydrogenated oils, palm oil, coconut oil, palm kernel oil. Solid fats and shortenings, including bacon fat, salt pork, lard, and butter. Nondairy cream substitutes. Salad dressings with cheese or sour cream. Beverages Regular sodas and any drinks with added sugar. Sweets and desserts Frosting. Pudding. Cookies. Cakes. Pies. Milk chocolate or white chocolate. Buttered syrups. Full-fat ice cream or ice cream drinks. The items listed above may not be a complete list of foods and beverages to avoid. Contact a dietitian for more information. Summary Heart-healthy meal planning includes limiting unhealthy fats, increasing healthy fats, limiting salt (sodium) intake and making other diet and lifestyle changes. Lose weight if you are overweight. Losing just 5-10% of your body weight can help your overall health and prevent diseases such as diabetes and heart disease. Focus on eating a balance of foods,  including fruits and vegetables, low-fat or nonfat dairy, lean protein, nuts and legumes, whole grains, and heart-healthy oils and fats. This information is not intended to replace advice given to you by your health care provider. Make sure you discuss any questions you have with your health care provider. Document Revised: 04/28/2021 Document Reviewed: 04/28/2021 Elsevier Patient Education  2024 [employer].

## 2022-09-26 ENCOUNTER — Other Ambulatory Visit: Payer: Self-pay

## 2022-09-26 ENCOUNTER — Encounter (HOSPITAL_COMMUNITY): Payer: Self-pay | Admitting: Family Medicine

## 2022-09-26 DIAGNOSIS — E875 Hyperkalemia: Secondary | ICD-10-CM | POA: Diagnosis present

## 2022-09-26 LAB — BASIC METABOLIC PANEL
Anion gap: 8 (ref 5–15)
BUN: 42 mg/dL — ABNORMAL HIGH (ref 6–20)
CO2: 18 mmol/L — ABNORMAL LOW (ref 22–32)
Calcium: 8.7 mg/dL — ABNORMAL LOW (ref 8.9–10.3)
Chloride: 110 mmol/L (ref 98–111)
Creatinine, Ser: 1.78 mg/dL — ABNORMAL HIGH (ref 0.44–1.00)
GFR, Estimated: 33 mL/min — ABNORMAL LOW (ref >60)
Glucose, Bld: 167 mg/dL — ABNORMAL HIGH (ref 70–99)
Potassium: 5.4 mmol/L — ABNORMAL HIGH (ref 3.5–5.1)
Sodium: 136 mmol/L (ref 135–145)

## 2022-09-26 LAB — HIV ANTIBODY (ROUTINE TESTING W REFLEX): HIV Screen 4th Generation wRfx: NONREACTIVE

## 2022-09-26 LAB — GLUCOSE, CAPILLARY: Glucose-Capillary: 153 mg/dL — ABNORMAL HIGH (ref 70–99)

## 2022-09-26 LAB — HEMOGLOBIN A1C
Hgb A1c MFr Bld: 8.7 % — ABNORMAL HIGH (ref 4.8–5.6)
Mean Plasma Glucose: 202.99 mg/dL

## 2022-09-26 LAB — CBC
Hemoglobin: 11 g/dL — ABNORMAL LOW (ref 11.1–15.9)
MCHC: 31.6 g/dL (ref 31.5–35.7)

## 2022-09-26 LAB — TROPONIN I (HIGH SENSITIVITY): Troponin I (High Sensitivity): 6 ng/L (ref <18)

## 2022-09-26 LAB — CBG MONITORING, ED: Glucose-Capillary: 137 mg/dL — ABNORMAL HIGH (ref 70–99)

## 2022-09-26 LAB — MAGNESIUM: Magnesium: 2.2 mg/dL (ref 1.6–2.3)

## 2022-09-26 LAB — TSH: TSH: 1.124 u[IU]/mL (ref 0.350–4.500)

## 2022-09-26 MED ORDER — SODIUM CHLORIDE 0.9 % IV SOLN: INTRAVENOUS | Status: DC

## 2022-09-26 MED ORDER — HYDRALAZINE HCL 50 MG PO TABS
50.0000 mg | ORAL_TABLET | Freq: Three times a day (TID) | ORAL | Status: DC
  Administered 2022-09-26 – 2022-09-29 (×9): 50 mg via ORAL
  Filled 2022-09-26 (×6): qty 1
  Filled 2022-09-26: qty 2
  Filled 2022-09-26 (×3): qty 1

## 2022-09-26 MED ORDER — INSULIN ISOPHANE & REGULAR (HUMAN 70-30)100 UNIT/ML KWIKPEN: 14.0000 [IU] | PEN_INJECTOR | Freq: Two times a day (BID) | SUBCUTANEOUS | Status: DC

## 2022-09-26 MED ORDER — METOPROLOL TARTRATE 5 MG/5ML IV SOLN
5.0000 mg | Freq: Four times a day (QID) | INTRAVENOUS | Status: DC | PRN
  Administered 2022-09-28 – 2022-09-29 (×2): 5 mg via INTRAVENOUS
  Filled 2022-09-26 (×2): qty 5

## 2022-09-26 MED ORDER — RIVAROXABAN 10 MG PO TABS
20.0000 mg | ORAL_TABLET | Freq: Every day | ORAL | Status: DC
  Administered 2022-09-26 – 2022-09-27 (×2): 20 mg via ORAL
  Filled 2022-09-26 (×2): qty 2

## 2022-09-26 MED ORDER — ATORVASTATIN CALCIUM 20 MG PO TABS
40.0000 mg | ORAL_TABLET | Freq: Every day | ORAL | Status: DC
  Administered 2022-09-26 – 2022-09-29 (×4): 40 mg via ORAL
  Filled 2022-09-26 (×3): qty 2
  Filled 2022-09-26: qty 1

## 2022-09-26 MED ORDER — INSULIN ASPART 100 UNIT/ML IJ SOLN
0.0000 [IU] | Freq: Every day | INTRAMUSCULAR | Status: DC
  Filled 2022-09-26: qty 0.05

## 2022-09-26 MED ORDER — INSULIN ASPART PROT & ASPART (70-30 MIX) 100 UNIT/ML ~~LOC~~ SUSP
14.0000 [IU] | Freq: Two times a day (BID) | SUBCUTANEOUS | Status: DC
  Administered 2022-09-26: 14 [IU] via SUBCUTANEOUS
  Filled 2022-09-26: qty 10

## 2022-09-26 MED ORDER — FLECAINIDE ACETATE 50 MG PO TABS
100.0000 mg | ORAL_TABLET | Freq: Two times a day (BID) | ORAL | Status: DC
  Administered 2022-09-26 – 2022-09-29 (×7): 100 mg via ORAL
  Filled 2022-09-26 (×6): qty 2
  Filled 2022-09-26: qty 1

## 2022-09-26 MED ORDER — DILTIAZEM HCL ER COATED BEADS 180 MG PO CP24
180.0000 mg | ORAL_CAPSULE | Freq: Every day | ORAL | Status: DC
  Administered 2022-09-26 – 2022-09-28 (×3): 180 mg via ORAL
  Filled 2022-09-26 (×2): qty 1

## 2022-09-26 NOTE — Progress Notes (Signed)
Dawn Braun

## 2022-09-26 NOTE — H&P (Signed)
History and Physical    Dawn Braun:[id number] DOB: [birth year]  DOA: 09/25/2022 PCP: Georganna Skeans, MD  Patient coming from: Home  Chief Complaint:  Chief Complaint  Patient presents with   Abnormal Lab    HPI: Dawn Braun is a 57 y.o. female with medical history significant of PAF, diabetes mellitus, CKD stage III, hypertension and obesity.  Patient presented to the cardiology office complaining of dizziness and malaise, labs were obtained and found to have hyperkalemia, 6.2, therefore she was referred to the emergency department for further evaluation.  Recently she was started on Benicar/HCT, and Zio patch was ordered due to concerns of palpitation.  Patient currently doing well dizziness has improved, ER evaluation showed hyperkalemia at 5.8, she was given Lasix improving potassium to 5.4.  Found to be hypertensive with blood pressure ranging from 150-190/85-99.  She does endorse shortness of breath with exertion and leg swelling.  Patient reported palpitations been on and off for several days.  Denies any chest pain, cough, syncope or headaches.   Review of Systems:    General: no changes in body weight, no fever chills or decrease in energy.  HEENT: no blurry vision, hearing changes or sore throat Respiratory: no coughing, wheezing GI: no nausea, vomiting, abdominal pain, diarrhea, constipation GU: no dysuria, burning on urination, increased urinary frequency, hematuria  Ext:. No deformities, Neuro: no unilateral weakness, numbness, or tingling, no vision change or hearing loss Skin: No rashes, lesions or wounds. MSK: No muscle spasm, no deformity, no limitation of range of movement in spin Heme: No easy bruising.  Travel history: No recent long distant travel.   Past Medical History:  Diagnosis Date   Asthma    Chronic kidney disease (CKD) stage G3a/A3, moderately decreased glomerular filtration rate (GFR) between 45-59 mL/min/1.73 square meter and  albuminuria creatinine ratio greater than 300 mg/g (HCC)    Complication of anesthesia    "takes me awhile to wakeup" (05/17/2017)   Hypertension    Intractable nausea and vomiting    Microalbuminuria due to type 2 diabetes mellitus (HCC)    Paroxysmal atrial fibrillation (HCC)    Small bowel obstruction (HCC) 03/26/2019   Type II diabetes mellitus (HCC)     Past Surgical History:  Procedure Laterality Date   CESAREAN SECTION  2003   CHOLECYSTECTOMY N/A 12/23/2016   Procedure: LAPAROSCOPIC CHOLECYSTECTOMY;  Surgeon: Rodman Pickle, MD;  Location: [facility];  Service: General;  Laterality: N/A;   COLONOSCOPY  05/02/2020   CG, MD   EYE SURGERY Right ~ 2016   "bleeding issues; lasered; cauterized leaks"   KNEE ARTHROSCOPY Right X 2   LAPAROTOMY N/A 03/26/2019   Procedure: EXPLORATORY LAPAROTOMY;  Surgeon: Darnell Level, MD;  Location: [facility];  Service: General;  Laterality: N/A;   LYSIS OF ADHESION N/A 03/26/2019   Procedure: LYSIS OF ADHESION;  Surgeon: Darnell Level, MD;  Location: [facility];  Service: General;  Laterality: N/A;   SPLIT NIGHT STUDY  08/06/2015   TUBAL LIGATION  2003   UPPER GASTROINTESTINAL ENDOSCOPY  05/02/2020   CG, MD     reports that she has never smoked. She has never used smokeless tobacco. She reports current alcohol use of about 1.0 standard drink of alcohol per week. She reports that she does not use drugs.  Allergies  Allergen Reactions   Amoxicillin     Per pt "Rash"    Family History  Problem Relation Age of Onset   Epilepsy Father  Diabetes Mother    Kidney disease Mother    Hypertension Mother    Kidney disease Brother    Diabetic kidney disease Brother    Diabetes Brother    Colon cancer Neg Hx    Pancreatic cancer Neg Hx    Esophageal cancer Neg Hx    Rectal cancer Neg Hx    Stomach cancer Neg Hx    Liver cancer Neg Hx     Prior to Admission medications   Medication Sig Start Date End Date Taking? Authorizing Provider   atorvastatin (LIPITOR) 40 MG tablet Take 1 tablet by mouth daily.   Yes [provider]  DILT-XR 180 MG 24 hr capsule Take 1 capsule by mouth once daily 09/30/21  Yes Jodelle Red, MD  flecainide (TAMBOCOR) 100 MG tablet Take 1 tablet by mouth twice daily 04/29/22  Yes Jodelle Red, MD  glimepiride (AMARYL) 4 MG tablet Take 1 tablet by mouth daily.   Yes [provider]  insulin isophane & regular human KwikPen (NOVOLIN 70/30 KWIKPEN) (70-30) 100 UNIT/ML KwikPen Inject 14 Units into the skin in the morning and at bedtime.   Yes [provider]  olmesartan-hydrochlorothiazide (BENICAR HCT) 40-25 MG tablet Take 1 tablet by mouth daily. 09/25/22  Yes Conte, Tessa N, PA-C  XARELTO 20 MG TABS tablet TAKE 1 TABLET BY MOUTH ONCE DAILY WITH  SUPPER 02/06/20  Yes Fulp, Cammie, MD  cyclobenzaprine (FLEXERIL) 10 MG tablet Take 1 tablet (10 mg total) by mouth at bedtime. Patient not taking: Reported on 09/26/2022 01/16/21   Georganna Skeans, MD  dapagliflozin propanediol (FARXIGA) 10 MG TABS tablet Take 1 tablet (10 mg total) by mouth daily before breakfast. 09/25/22   Sharlene Dory, PA-C  Dulaglutide (TRULICITY) 1.5 MG/0.5ML SOPN Inject 1.5 mg into the skin once a week. Patient not taking: Reported on 09/26/2022 01/28/21   Hoy Register, MD  empagliflozin (JARDIANCE) 10 MG TABS tablet Take 1 tablet (10 mg total) by mouth daily before breakfast. Patient not taking: Reported on 09/26/2022 06/27/21   Jodelle Red, MD  famotidine (PEPCID) 20 MG tablet Take 1 tablet (20 mg total) by mouth 2 (two) times daily. AM and bedtime Patient not taking: Reported on 09/26/2022 03/11/20   Iva Boop, MD  insulin isophane & regular human (NOVOLIN 70/30 FLEXPEN) (70-30) 100 UNIT/ML KwikPen Inject 9 Units into the skin 2 (two) times daily. 08/03/19 10/31/20  Anders Simmonds, PA-C  Insulin Pen Needle 31G X 4 MM MISC 1 Container by Does not apply route 2 (two) times daily.  08/03/19   Anders Simmonds, PA-C  lisinopril (ZESTRIL) 40 MG tablet Take 1 tablet by mouth once daily Patient not taking: Reported on 09/26/2022 07/25/21   Georganna Skeans, MD  metFORMIN (GLUCOPHAGE) 500 MG tablet Take 1 tablet (500 mg total) by mouth 2 (two) times daily with a meal. Patient not taking: Reported on 09/26/2022 01/28/21   Hoy Register, MD  ondansetron (ZOFRAN ODT) 4 MG disintegrating tablet Take 1 tablet (4 mg total) by mouth every 8 (eight) hours as needed for nausea or vomiting. Patient not taking: Reported on 09/26/2022 05/12/20   Pollyann Savoy, MD    Physical Exam: Vitals:   09/26/22 0430 09/26/22 0615 09/26/22 0830 09/26/22 0849  BP: (!) 145/68 (!) 169/80 (!) 190/99   Pulse: (!) 120 (!) 106 (!) 102   Resp: 12 17 10    Temp:    97.9 F (36.6 C)  TempSrc:    Oral  SpO2: 100%  100% 100%     Constitutional: NAD, calm, comfortable Eyes: PERRL, lids and conjunctivae normal ENMT: Mucous membranes are moist. Posterior pharynx clear of any exudate or lesions.Normal dentition.  Neck: normal, supple, no masses, no thyromegaly Respiratory: clear to auscultation bilaterally, no wheezing, no crackles. Normal respiratory effort. No accessory muscle use.  Cardiovascular: Regular rate and rhythm, no murmurs / rubs / gallops.  Bilateral lower extremity edema 1+ pitting. 2+ pedal pulses. No carotid bruits.  Abdomen: no tenderness, no masses palpated. No hepatosplenomegaly. Bowel sounds positive.  Musculoskeletal: no clubbing / cyanosis. No joint deformity upper and lower extremities. Good ROM, no contractures. Normal muscle tone.  Skin: no rashes, lesions, ulcers. No induration Neurologic: CN 2-12 grossly intact. Sensation intact, DTR normal. Strength 5/5 in all 4.  Psychiatric: Normal judgment and insight. Alert and oriented x 3. Normal mood.    Labs on Admission: I have personally reviewed following labs and imaging studies  CBC: Recent Labs  Lab 09/25/22 1030 09/25/22 2125   WBC 7.6 8.6  HGB 11.0* 10.1*  HCT 34.8 32.4*  MCV 89 90.3  PLT 332 307   Basic Metabolic Panel: Recent Labs  Lab 09/25/22 1030 09/25/22 2125  NA 143 137  K 6.2* 5.8*  CL 113* 112*  CO2 18* 17*  GLUCOSE 78 142*  BUN 45* 46*  CREATININE 1.82* 1.72*  CALCIUM 9.6 8.5*  MG 2.2  --    GFR: Estimated Creatinine Clearance: 41.1 mL/min (A) (by C-G formula based on SCr of 1.72 mg/dL (H)). Liver Function Tests: No results for input(s): "AST", "ALT", "ALKPHOS", "BILITOT", "PROT", "ALBUMIN" in the last 168 hours. No results for input(s): "LIPASE", "AMYLASE" in the last 168 hours. No results for input(s): "AMMONIA" in the last 168 hours. Coagulation Profile: No results for input(s): "INR", "PROTIME" in the last 168 hours. Cardiac Enzymes: No results for input(s): "CKTOTAL", "CKMB", "CKMBINDEX", "TROPONINI" in the last 168 hours. BNP (last 3 results) Recent Labs    09/25/22 1030  PROBNP 119   HbA1C: No results for input(s): "HGBA1C" in the last 72 hours. CBG: Recent Labs  Lab 09/26/22 0236  GLUCAP 137*   Lipid Profile: No results for input(s): "CHOL", "HDL", "LDLCALC", "TRIG", "CHOLHDL", "LDLDIRECT" in the last 72 hours. Thyroid Function Tests: Recent Labs    09/25/22 1030  TSH 1.080   Anemia Panel: No results for input(s): "VITAMINB12", "FOLATE", "FERRITIN", "TIBC", "IRON", "RETICCTPCT" in the last 72 hours. Urine analysis:    Component Value Date/Time   COLORURINE AMBER (A) 05/12/2020 1712   APPEARANCEUR CLEAR 05/12/2020 1712   LABSPEC 1.028 05/12/2020 1712   PHURINE 6.0 05/12/2020 1712   GLUCOSEU NEGATIVE 05/12/2020 1712   HGBUR NEGATIVE 05/12/2020 1712   BILIRUBINUR NEGATIVE 05/12/2020 1712   BILIRUBINUR negative 04/27/2019 1048   KETONESUR NEGATIVE 05/12/2020 1712   PROTEINUR >=300 (A) 05/12/2020 1712   UROBILINOGEN 0.2 04/27/2019 1048   NITRITE NEGATIVE 05/12/2020 1712   LEUKOCYTESUR NEGATIVE 05/12/2020 1712   Sepsis Labs:  !!!!!!!!!!!!!!!!!!!!!!!!!!!!!!!!!!!!!!!!!!!! @LABRCNTIP (procalcitonin:4,lacticidven:4) )No results found for this or any previous visit (from the past 240 hour(s)).   Radiological Exams on Admission: No results found.  EKG: Independently reviewed.  Sinus tachycardia, no peaked T wave or QT prolongation.  Assessment/Plan Principal Problem:  #Hyperkalemia Will place in obs.  Likely medication related.  Continue IV fluid.  Discontinue Benicar.  Check BMP and magnesium in AM.  #Hypertensive urgency  Blood pressure significantly elevated, discontinue Benicar for now, resume diltiazem, start hydralazine 50 mg every 8 hours.  Metoprolol  IV every 6 hours as needed for systolic blood pressure above 295.  #AKI on CKD stage 3 Acute, could be medication related as well.  SCr baseline 1.4-1.5.  Continue IV fluids.  Avoid nephrotoxic agents.  Checking BMP in the morning.  #PAF  Currently in sinus rhythm, continue diltiazem and Xarelto.  #DM type 2 Chronic, stable.  Continue home insulin 70/30 and SSI.   DVT prophylaxis: Xarelto  Code Status: Full code Family Communication: None at bedside  Disposition Plan: Anticipate discharge to previous home environment.  Consults called: None  Admission status: Observation   Latrelle Dodrill MD [facility] Pager: Text Page via [email or web address]    If 7PM-7AM, please contact night-coverage [email or web address]  09/26/2022, 9:08 AM

## 2022-09-26 NOTE — ED Notes (Signed)
ED TO INPATIENT HANDOFF REPORT  Name/Age/Gender Dawn Braun 57 y.o. female  Code Status    Code Status Orders  (From admission, onward)           Start     Ordered   09/26/22 0853  Full code  Continuous       Question:  By:  Answer:  Consent: discussion documented in EHR   09/26/22 0856           Code Status History     Date Active Date Inactive Code Status Order ID Comments User Context   03/25/2019 2129 04/01/2019 1912 Full Code [id number]  Darnell Level, MD ED   09/18/2018 1328 09/19/2018 1900 Full Code [id number]  Zannie Cove, MD Inpatient   05/17/2017 1056 05/20/2017 1716 Full Code [id number]  Gwenyth Bender, NP Inpatient   12/31/2016 2141 01/03/2017 1512 Full Code [id number]  Lorretta Harp, MD ED   12/23/2016 0148 12/29/2016 1451 Full Code [id number]  Darnell Level, MD ED       Home/SNF/Other Home  Chief Complaint Hyperkalemia [E87.5]  Level of Care/Admitting Diagnosis ED Disposition     ED Disposition  Admit   Condition  --   Comment  Hospital Area: [facility] [[id number]]  Level of Care: Med-Surg [16]  May place patient in observation at [facility] or [facility] if equivalent level of care is available:: Yes  Covid Evaluation: Asymptomatic - no recent exposure (last 10 days) testing not required  Diagnosis: Hyperkalemia [244010]  Admitting Physician: Randel Pigg, EDWIN [[id number]]  Attending Physician: Randel Pigg, EDWIN [phone]          Medical History Past Medical History:  Diagnosis Date   Asthma    Chronic kidney disease (CKD) stage G3a/A3, moderately decreased glomerular filtration rate (GFR) between 45-59 mL/min/1.73 square meter and albuminuria creatinine ratio greater than 300 mg/g (HCC)    Complication of anesthesia    "takes me awhile to wakeup" (05/17/2017)   Hypertension    Intractable nausea and vomiting    Microalbuminuria due to type 2 diabetes mellitus (HCC)    Paroxysmal atrial fibrillation (HCC)     Small bowel obstruction (HCC) 03/26/2019   Type II diabetes mellitus (HCC)     Allergies Allergies  Allergen Reactions   Amoxicillin     Per pt "Rash"    IV Location/Drains/Wounds Patient Lines/Drains/Airways Status     Active Line/Drains/Airways     Name Placement date Placement time Site Days   Peripheral IV 09/26/22 22 G Anterior;Proximal;Right Forearm 09/26/22  0024  Forearm  less than 1            Labs/Imaging Results for orders placed or performed during the hospital encounter of 09/25/22 (from the past 48 hour(s))  Basic metabolic panel     Status: Abnormal   Collection Time: 09/25/22  9:25 PM  Result Value Ref Range   Sodium 137 135 - 145 mmol/L   Potassium 5.8 (H) 3.5 - 5.1 mmol/L   Chloride 112 (H) 98 - 111 mmol/L   CO2 17 (L) 22 - 32 mmol/L   Glucose, Bld 142 (H) 70 - 99 mg/dL    Comment: Glucose reference range applies only to samples taken after fasting for at least 8 hours.   BUN 46 (H) 6 - 20 mg/dL   Creatinine, Ser 2.59 (H) 0.44 - 1.00 mg/dL   Calcium 8.5 (L) 8.9 - 10.3 mg/dL   GFR, Estimated 34 (L) >60 mL/min    Comment: (NOTE)  Calculated using the CKD-EPI Creatinine Equation (2021)    Anion gap 8 5 - 15    Comment: Performed at [facility], [address], [place], [place] [address]  CBC     Status: Abnormal   Collection Time: 09/25/22  9:25 PM  Result Value Ref Range   WBC 8.6 4.0 - 10.5 K/uL   RBC 3.59 (L) 3.87 - 5.11 MIL/uL   Hemoglobin 10.1 (L) 12.0 - 15.0 g/dL   HCT 23.7 (L) 62.8 - 31.5 %   MCV 90.3 80.0 - 100.0 fL   MCH 28.1 26.0 - 34.0 pg   MCHC 31.2 30.0 - 36.0 g/dL   RDW 17.6 16.0 - 73.7 %   Platelets 307 150 - 400 K/uL   nRBC 0.0 0.0 - 0.2 %    Comment: Performed at [facility], [address], [place], [place] [address]  Troponin I (High Sensitivity)     Status: None   Collection Time: 09/25/22  9:25 PM  Result Value Ref Range   Troponin I (High Sensitivity) 6 <18 ng/L    Comment:  (NOTE) Elevated high sensitivity troponin I (hsTnI) values and significant  changes across serial measurements may suggest ACS but many other  chronic and acute conditions are known to elevate hsTnI results.  Refer to the "Links" section for chest pain algorithms and additional  guidance. Performed at [facility], [address] [place], [place] [address]   CBG monitoring, ED     Status: Abnormal   Collection Time: 09/26/22  2:36 AM  Result Value Ref Range   Glucose-Capillary 137 (H) 70 - 99 mg/dL    Comment: Glucose reference range applies only to samples taken after fasting for at least 8 hours.  Troponin I (High Sensitivity)     Status: None   Collection Time: 09/26/22  2:37 AM  Result Value Ref Range   Troponin I (High Sensitivity) 6 <18 ng/L    Comment: (NOTE) Elevated high sensitivity troponin I (hsTnI) values and significant  changes across serial measurements may suggest ACS but many other  chronic and acute conditions are known to elevate hsTnI results.  Refer to the "Links" section for chest pain algorithms and additional  guidance. Performed at [facility], [address], [place], [place] [address]    No results found.  Pending Labs Unresulted Labs (From admission, onward)     Start     Ordered   09/26/22 0901  HIV Antibody (routine testing w rflx)  (HIV Antibody (Routine testing w reflex) panel)  Once,   R        09/26/22 0900   09/26/22 0901  Hemoglobin A1c  (Glycemic Control (SSI)  Q 4 Hours / Glycemic Control (SSI)  AC +/- HS)  Once,   R       Comments: To assess prior glycemic control    09/26/22 0900   09/26/22 0901  Basic metabolic panel  Once,   R        09/26/22 0900   09/26/22 0901  TSH  Once,   R        09/26/22 0900            Vitals/Pain Today's Vitals   09/26/22 0615 09/26/22 0830 09/26/22 0849 09/26/22 1045  BP: (!) 169/80 (!) 190/99  (!) 173/87  Pulse: (!) 106 (!) 102  92  Resp: 17 10  14    Temp:   97.9 F (36.6 C) 97.9  F (36.6 C)  TempSrc:   Oral Oral  SpO2: 100% 100%  100%  PainSc:        Isolation Precautions No active isolations  Medications Medications  insulin aspart (novoLOG) injection 0-5 Units (has no administration in time range)  0.9 %  sodium chloride infusion ( Intravenous New Bag/Given 09/26/22 0946)  metoprolol tartrate (LOPRESSOR) injection 5 mg (has no administration in time range)  atorvastatin (LIPITOR) tablet 40 mg (40 mg Oral Given 09/26/22 1004)  diltiazem (CARDIZEM CD) 24 hr capsule 180 mg (180 mg Oral Given 09/26/22 1005)  flecainide (TAMBOCOR) tablet 100 mg (100 mg Oral Given 09/26/22 1006)  rivaroxaban (XARELTO) tablet 20 mg (has no administration in time range)  hydrALAZINE (APRESOLINE) tablet 50 mg (50 mg Oral Given 09/26/22 1005)  insulin aspart protamine- aspart (NOVOLOG MIX 70/30) injection 14 Units (has no administration in time range)  furosemide (LASIX) injection 40 mg (40 mg Intravenous Given 09/26/22 0032)  sodium zirconium cyclosilicate (LOKELMA) packet 10 g (10 g Oral Given 09/26/22 0026)  albuterol (PROVENTIL) (2.5 MG/3ML) 0.083% nebulizer solution 10 mg (10 mg Nebulization Given 09/26/22 0008)  insulin aspart (novoLOG) injection 5 Units (5 Units Intravenous Given 09/26/22 0028)    And  dextrose 50 % solution 50 mL (50 mLs Intravenous Given 09/26/22 0029)    Mobility Walks

## 2022-09-27 ENCOUNTER — Encounter (HOSPITAL_COMMUNITY): Payer: Self-pay | Admitting: Family Medicine

## 2022-09-27 DIAGNOSIS — Z841 Family history of disorders of kidney and ureter: Secondary | ICD-10-CM | POA: Diagnosis not present

## 2022-09-27 DIAGNOSIS — Z833 Family history of diabetes mellitus: Secondary | ICD-10-CM | POA: Diagnosis not present

## 2022-09-27 DIAGNOSIS — J45909 Unspecified asthma, uncomplicated: Secondary | ICD-10-CM | POA: Diagnosis present

## 2022-09-27 DIAGNOSIS — Z7984 Long term (current) use of oral hypoglycemic drugs: Secondary | ICD-10-CM | POA: Diagnosis not present

## 2022-09-27 DIAGNOSIS — I160 Hypertensive urgency: Secondary | ICD-10-CM | POA: Diagnosis present

## 2022-09-27 DIAGNOSIS — R9431 Abnormal electrocardiogram [ECG] [EKG]: Secondary | ICD-10-CM | POA: Diagnosis present

## 2022-09-27 DIAGNOSIS — N179 Acute kidney failure, unspecified: Secondary | ICD-10-CM

## 2022-09-27 DIAGNOSIS — E875 Hyperkalemia: Secondary | ICD-10-CM | POA: Diagnosis present

## 2022-09-27 DIAGNOSIS — E669 Obesity, unspecified: Secondary | ICD-10-CM

## 2022-09-27 DIAGNOSIS — Z6841 Body Mass Index (BMI) 40.0 and over, adult: Secondary | ICD-10-CM | POA: Diagnosis not present

## 2022-09-27 DIAGNOSIS — I480 Paroxysmal atrial fibrillation: Secondary | ICD-10-CM | POA: Diagnosis present

## 2022-09-27 DIAGNOSIS — E1122 Type 2 diabetes mellitus with diabetic chronic kidney disease: Secondary | ICD-10-CM | POA: Diagnosis present

## 2022-09-27 DIAGNOSIS — Z8249 Family history of ischemic heart disease and other diseases of the circulatory system: Secondary | ICD-10-CM | POA: Diagnosis not present

## 2022-09-27 DIAGNOSIS — E1169 Type 2 diabetes mellitus with other specified complication: Secondary | ICD-10-CM

## 2022-09-27 DIAGNOSIS — N1832 Chronic kidney disease, stage 3b: Secondary | ICD-10-CM | POA: Diagnosis present

## 2022-09-27 DIAGNOSIS — Z880 Allergy status to penicillin: Secondary | ICD-10-CM | POA: Diagnosis not present

## 2022-09-27 DIAGNOSIS — R112 Nausea with vomiting, unspecified: Secondary | ICD-10-CM | POA: Diagnosis present

## 2022-09-27 DIAGNOSIS — Z79899 Other long term (current) drug therapy: Secondary | ICD-10-CM | POA: Diagnosis not present

## 2022-09-27 DIAGNOSIS — Z820 Family history of epilepsy and other diseases of the nervous system: Secondary | ICD-10-CM | POA: Diagnosis not present

## 2022-09-27 DIAGNOSIS — Z794 Long term (current) use of insulin: Secondary | ICD-10-CM | POA: Diagnosis not present

## 2022-09-27 DIAGNOSIS — I129 Hypertensive chronic kidney disease with stage 1 through stage 4 chronic kidney disease, or unspecified chronic kidney disease: Secondary | ICD-10-CM | POA: Diagnosis present

## 2022-09-27 DIAGNOSIS — Z7901 Long term (current) use of anticoagulants: Secondary | ICD-10-CM | POA: Diagnosis not present

## 2022-09-27 DIAGNOSIS — Z9049 Acquired absence of other specified parts of digestive tract: Secondary | ICD-10-CM | POA: Diagnosis not present

## 2022-09-27 DIAGNOSIS — Z7985 Long-term (current) use of injectable non-insulin antidiabetic drugs: Secondary | ICD-10-CM | POA: Diagnosis not present

## 2022-09-27 LAB — BASIC METABOLIC PANEL
Anion gap: 7 (ref 5–15)
Anion gap: 5 (ref 5–15)
BUN: 48 mg/dL — ABNORMAL HIGH (ref 6–20)
BUN: 42 mg/dL — ABNORMAL HIGH (ref 6–20)
CO2: 19 mmol/L — ABNORMAL LOW (ref 22–32)
CO2: 19 mmol/L — ABNORMAL LOW (ref 22–32)
Calcium: 8.5 mg/dL — ABNORMAL LOW (ref 8.9–10.3)
Calcium: 8.5 mg/dL — ABNORMAL LOW (ref 8.9–10.3)
Chloride: 112 mmol/L — ABNORMAL HIGH (ref 98–111)
Chloride: 112 mmol/L — ABNORMAL HIGH (ref 98–111)
Creatinine, Ser: 1.86 mg/dL — ABNORMAL HIGH (ref 0.44–1.00)
Creatinine, Ser: 1.76 mg/dL — ABNORMAL HIGH (ref 0.44–1.00)
GFR, Estimated: 31 mL/min — ABNORMAL LOW (ref >60)
GFR, Estimated: 33 mL/min — ABNORMAL LOW (ref >60)
Glucose, Bld: 182 mg/dL — ABNORMAL HIGH (ref 70–99)
Glucose, Bld: 239 mg/dL — ABNORMAL HIGH (ref 70–99)
Potassium: 5.5 mmol/L — ABNORMAL HIGH (ref 3.5–5.1)
Potassium: 6 mmol/L — ABNORMAL HIGH (ref 3.5–5.1)
Sodium: 138 mmol/L (ref 135–145)
Sodium: 136 mmol/L (ref 135–145)

## 2022-09-27 LAB — GLUCOSE, CAPILLARY
Glucose-Capillary: 183 mg/dL — ABNORMAL HIGH (ref 70–99)
Glucose-Capillary: 180 mg/dL — ABNORMAL HIGH (ref 70–99)
Glucose-Capillary: 236 mg/dL — ABNORMAL HIGH (ref 70–99)
Glucose-Capillary: 122 mg/dL — ABNORMAL HIGH (ref 70–99)

## 2022-09-27 LAB — MAGNESIUM: Magnesium: 2.1 mg/dL (ref 1.7–2.4)

## 2022-09-27 MED ORDER — SODIUM ZIRCONIUM CYCLOSILICATE 10 G PO PACK
10.0000 g | PACK | Freq: Once | ORAL | Status: AC
  Administered 2022-09-27: 10 g via ORAL
  Filled 2022-09-27: qty 1

## 2022-09-27 MED ORDER — INSULIN ASPART PROT & ASPART (70-30 MIX) 100 UNIT/ML ~~LOC~~ SUSP
7.0000 [IU] | Freq: Two times a day (BID) | SUBCUTANEOUS | Status: DC
  Administered 2022-09-27 – 2022-09-29 (×4): 7 [IU] via SUBCUTANEOUS

## 2022-09-27 MED ORDER — LACTATED RINGERS IV SOLN: INTRAVENOUS | Status: DC

## 2022-09-27 MED ORDER — ACETAMINOPHEN 325 MG PO TABS
650.0000 mg | ORAL_TABLET | Freq: Four times a day (QID) | ORAL | Status: DC | PRN
  Administered 2022-09-27 – 2022-09-29 (×3): 650 mg via ORAL
  Filled 2022-09-27 (×2): qty 2

## 2022-09-27 MED ORDER — ONDANSETRON HCL 4 MG/2ML IJ SOLN
4.0000 mg | Freq: Four times a day (QID) | INTRAMUSCULAR | Status: DC | PRN
  Administered 2022-09-27 – 2022-09-28 (×3): 4 mg via INTRAVENOUS
  Filled 2022-09-27 (×3): qty 2

## 2022-09-27 NOTE — Hospital Course (Addendum)
57 year old woman PMH including PAF, CKD, recently seen by cardiology and labs were obtained, found to be hyperkalemic and sent to the emergency department.  Recently had Benicar/hydrochlorothiazide increased and Zio patch was placed.

## 2022-09-27 NOTE — Progress Notes (Signed)
  Progress Note   Patient: Dawn Braun ZOX:[id number] DOB: [birth year] DOA: 09/25/2022     0 DOS: the patient was seen and examined on 09/27/2022   Brief hospital course: 57 year old woman PMH including PAF, CKD, recently seen by cardiology and labs were obtained, found to be hyperkalemic and sent to the emergency department.  Recently had Benicar/hydrochlorothiazide increased and Zio patch was placed.  Assessment and Plan: Hyperkalemia Remains high despite AM Lokelma.  Repeat EKG nonacute.  Dose additional Lokelma and repeat BMP in in AM. Likely secondary to ARB and AKI.   Hypertensive urgency  Essential hypertension Urgency resolved.   Benicar and hydrochlorothiazide discontinued.  Continue diltiazem.     AKI on CKD stage IIIb SCr baseline 1.4-1.5.  Continue IV fluids.  No significant change since admission. BMP in AM.  If no improvement, likely evaluate further with ultrasound and urine studies.   PAF  Currently in sinus rhythm, continue diltiazem, flecainide and Xarelto.   DM type 2 Chronic, stable.  Continue home insulin 70/30 and SSI.  Morbid obesity Body mass index is 43.27 kg/m.       Subjective:  HA this AM Vomited this am, but was able to eat some grapes just now  Physical Exam: Vitals:   09/27/22 0421 09/27/22 0826 09/27/22 1048 09/27/22 1309  BP: (!) 147/77 (!) 160/86 (!) 158/77 (!) 156/77  Pulse: 80 92 83 80  Resp: 20     Temp: 98.6 F (37 C)   98.1 F (36.7 C)  TempSrc: Oral   Oral  SpO2: 99% 100% 100% 100%  Weight:      Height:       Physical Exam Vitals reviewed.  Constitutional:      General: She is not in acute distress.    Appearance: She is not ill-appearing or toxic-appearing.  Cardiovascular:     Rate and Rhythm: Normal rate and regular rhythm.     Heart sounds: No murmur heard. Pulmonary:     Effort: Pulmonary effort is normal. No respiratory distress.     Breath sounds: No wheezing, rhonchi or rales.  Neurological:      Mental Status: She is alert.  Psychiatric:        Mood and Affect: Mood normal.        Behavior: Behavior normal.     Data Reviewed: AFVSS BP stable CBG stable K+ 6.2, 5.8, 5.4, 5.5 CO2 17-19 BUN 45, 46, 42, 48 Creatinine no sig change, 1.86; baseline perhaps 1.2-1.3 Hgb 10.1 A1c 8.7 TSH WNL EKGs on admit SR, ST; neither w/ acute changes   Family Communication: husband, son at bedside  Disposition: Status is: Observation   Planned Discharge Destination: Home    Time spent: 20 minutes  Author: Brendia Sacks, MD 09/27/2022 5:26 PM  For on call review [email or web address].

## 2022-09-28 ENCOUNTER — Inpatient Hospital Stay (HOSPITAL_COMMUNITY): Payer: BC Managed Care – PPO

## 2022-09-28 DIAGNOSIS — N179 Acute kidney failure, unspecified: Secondary | ICD-10-CM | POA: Diagnosis not present

## 2022-09-28 DIAGNOSIS — R112 Nausea with vomiting, unspecified: Secondary | ICD-10-CM

## 2022-09-28 DIAGNOSIS — E875 Hyperkalemia: Secondary | ICD-10-CM | POA: Diagnosis not present

## 2022-09-28 DIAGNOSIS — I480 Paroxysmal atrial fibrillation: Secondary | ICD-10-CM | POA: Diagnosis not present

## 2022-09-28 DIAGNOSIS — N1832 Chronic kidney disease, stage 3b: Secondary | ICD-10-CM | POA: Diagnosis not present

## 2022-09-28 LAB — CBC
MCH: 28.1 pg (ref 26.6–33.0)
MCV: 89 fL (ref 79–97)
Platelets: 332 10*3/uL (ref 150–450)
RDW: 13.2 % (ref 11.7–15.4)

## 2022-09-28 LAB — PRO B NATRIURETIC PEPTIDE: NT-Pro BNP: 119 pg/mL (ref 0–287)

## 2022-09-28 LAB — BASIC METABOLIC PANEL
Anion gap: 5 (ref 5–15)
BUN/Creatinine Ratio: 25 — ABNORMAL HIGH (ref 9–23)
BUN: 45 mg/dL — ABNORMAL HIGH (ref 6–24)
BUN: 39 mg/dL — ABNORMAL HIGH (ref 6–20)
CO2: 18 mmol/L — ABNORMAL LOW (ref 20–29)
CO2: 20 mmol/L — ABNORMAL LOW (ref 22–32)
Calcium: 9.6 mg/dL (ref 8.7–10.2)
Calcium: 8.4 mg/dL — ABNORMAL LOW (ref 8.9–10.3)
Chloride: 113 mmol/L — ABNORMAL HIGH (ref 96–106)
Chloride: 111 mmol/L (ref 98–111)
Creatinine, Ser: 1.82 mg/dL — ABNORMAL HIGH (ref 0.57–1.00)
Creatinine, Ser: 1.66 mg/dL — ABNORMAL HIGH (ref 0.44–1.00)
GFR, Estimated: 36 mL/min — ABNORMAL LOW (ref >60)
Glucose, Bld: 230 mg/dL — ABNORMAL HIGH (ref 70–99)
Glucose: 78 mg/dL (ref 70–99)
Potassium: 6.2 mmol/L (ref 3.5–5.2)
Potassium: 5.3 mmol/L — ABNORMAL HIGH (ref 3.5–5.1)
Sodium: 143 mmol/L (ref 134–144)
Sodium: 136 mmol/L (ref 135–145)
eGFR: 32 mL/min/{1.73_m2} — ABNORMAL LOW (ref >59)

## 2022-09-28 LAB — COMPREHENSIVE METABOLIC PANEL
ALT: 15 U/L (ref 0–44)
AST: 20 U/L (ref 15–41)
Albumin: 3.1 g/dL — ABNORMAL LOW (ref 3.5–5.0)
Alkaline Phosphatase: 107 U/L (ref 38–126)
Anion gap: 6 (ref 5–15)
BUN: 30 mg/dL — ABNORMAL HIGH (ref 6–20)
CO2: 20 mmol/L — ABNORMAL LOW (ref 22–32)
Calcium: 8.8 mg/dL — ABNORMAL LOW (ref 8.9–10.3)
Chloride: 113 mmol/L — ABNORMAL HIGH (ref 98–111)
Creatinine, Ser: 1.55 mg/dL — ABNORMAL HIGH (ref 0.44–1.00)
GFR, Estimated: 39 mL/min — ABNORMAL LOW (ref >60)
Glucose, Bld: 105 mg/dL — ABNORMAL HIGH (ref 70–99)
Potassium: 4.9 mmol/L (ref 3.5–5.1)
Sodium: 139 mmol/L (ref 135–145)
Total Bilirubin: 0.6 mg/dL (ref 0.3–1.2)
Total Protein: 6 g/dL — ABNORMAL LOW (ref 6.5–8.1)

## 2022-09-28 LAB — LIPASE, BLOOD: Lipase: 25 U/L (ref 11–51)

## 2022-09-28 LAB — GLUCOSE, CAPILLARY
Glucose-Capillary: 233 mg/dL — ABNORMAL HIGH (ref 70–99)
Glucose-Capillary: 191 mg/dL — ABNORMAL HIGH (ref 70–99)
Glucose-Capillary: 141 mg/dL — ABNORMAL HIGH (ref 70–99)
Glucose-Capillary: 101 mg/dL — ABNORMAL HIGH (ref 70–99)

## 2022-09-28 LAB — TSH: TSH: 1.08 u[IU]/mL (ref 0.450–4.500)

## 2022-09-28 MED ORDER — HYDRALAZINE HCL 20 MG/ML IJ SOLN: 5.0000 mg | Freq: Four times a day (QID) | INTRAMUSCULAR | Status: DC | PRN

## 2022-09-28 MED ORDER — SODIUM ZIRCONIUM CYCLOSILICATE 10 G PO PACK
10.0000 g | PACK | Freq: Once | ORAL | Status: DC
  Filled 2022-09-28: qty 1

## 2022-09-28 MED ORDER — DILTIAZEM HCL ER COATED BEADS 240 MG PO CP24
240.0000 mg | ORAL_CAPSULE | Freq: Every day | ORAL | Status: DC
  Administered 2022-09-29: 240 mg via ORAL
  Filled 2022-09-28: qty 1

## 2022-09-28 MED ORDER — PROCHLORPERAZINE EDISYLATE 10 MG/2ML IJ SOLN
10.0000 mg | Freq: Four times a day (QID) | INTRAMUSCULAR | Status: DC | PRN
  Administered 2022-09-28 – 2022-09-29 (×2): 10 mg via INTRAVENOUS
  Filled 2022-09-28 (×2): qty 2

## 2022-09-28 NOTE — Progress Notes (Signed)
   09/28/22 0929  TOC Brief Assessment  Insurance and Status Reviewed  Patient has primary care physician Yes  Home environment has been reviewed home with spouse  Prior level of function: independent  Prior/Current Home Services No current home services  Social Determinants of Health Reivew SDOH reviewed no interventions necessary  Readmission risk has been reviewed Yes  Transition of care needs no transition of care needs at this time

## 2022-09-28 NOTE — Progress Notes (Signed)
  Progress Note   Patient: Dawn Braun ZOX:[id number] DOB: October 21, 1965 DOA: 09/25/2022     1 DOS: the patient was seen and examined on 09/28/2022   Brief hospital course: 57 year old woman PMH including PAF, CKD, recently seen by cardiology and labs were obtained, found to be hyperkalemic and sent to the emergency department.  Recently had Benicar/hydrochlorothiazide increased and Zio patch was placed.  Assessment and Plan: Hyperkalemia Remains high but much better after [facility] yesterday.   Likely secondary to ARB and AKI. Not able to tolerate Lokelma this AM. Will check BMP in PM.  Nausea and vomiting No real abdominal pain.  Exam is benign. Abdominal x-ray is negative. Etiology is unclear.  Check CMP and lipase this evening. If fails to improve, consider further imaging.   Hypertensive urgency  Essential hypertension Urgency resolved.   Benicar and hydrochlorothiazide discontinued.   Continue diltiazem. BP remains high. Will increase diltiazem, add hydralazine PRN   AKI on CKD stage IIIb SCr baseline 1.4-1.5.  Continue IV fluids.  Creatinine slowly improving.  Close to baseline.   PAF  Currently in sinus rhythm, continue diltiazem, flecainide and Xarelto.   DM type 2 Continue home insulin 70/30 at reduced dose.  Change SSI to every 4. Of note the patient was on Farxiga.  Anion gap within normal limits.   Morbid obesity Body mass index is 43.27 kg/m.       Subjective:  Ate lunch and dinner no problem last night Vomited ~3am Now today w/ nausea and recurrent vomiting Dull headache  Physical Exam: Vitals:   09/27/22 2116 09/28/22 0505 09/28/22 0602 09/28/22 1307  BP: (!) 153/73 (!) 188/90 (!) 160/82 (!) 162/96  Pulse: 77 75 79 89  Resp: 16 15  16   Temp: 98.4 F (36.9 C) 98 F (36.7 C)  98.1 F (36.7 C)  TempSrc: Oral Oral  Oral  SpO2: 100% 99%  100%  Weight:      Height:       Physical Exam Vitals reviewed.  Constitutional:      General: She is  not in acute distress.    Appearance: She is ill-appearing. She is not toxic-appearing.  Cardiovascular:     Rate and Rhythm: Normal rate and regular rhythm.     Heart sounds: No murmur heard. Pulmonary:     Effort: Pulmonary effort is normal. No respiratory distress.     Breath sounds: No wheezing, rhonchi or rales.  Abdominal:     General: There is no distension.     Palpations: Abdomen is soft.     Tenderness: There is no abdominal tenderness. There is no guarding.  Neurological:     Mental Status: She is alert.  Psychiatric:        Mood and Affect: Mood normal.        Behavior: Behavior normal.    Data Reviewed: K+ 5.3 Creatinine down to 1.66 BUN down to 39 AXR no acute disease  Family Communication: none present  Disposition: Status is: Inpatient Remains inpatient appropriate because: vomiting, hyperkalmia  Planned Discharge Destination: Home    Time spent: 20 minutes  Author: Brendia Sacks, MD 09/28/2022 1:52 PM  For on call review [email or web address].

## 2022-09-28 NOTE — Inpatient Diabetes Management (Signed)
Inpatient Diabetes Program Recommendations  AACE/ADA: New Consensus Statement on Inpatient Glycemic Control (2015)  Target Ranges:  Prepandial:   less than 140 mg/dL      Peak postprandial:   less than 180 mg/dL (1-2 hours)      Critically ill patients:  140 - 180 mg/dL   Lab Results  Component Value Date   GLUCAP 191 (H) 09/28/2022   HGBA1C 8.7 (H) 09/26/2022    Review of Glycemic Control  Diabetes history: DM2 Outpatient Diabetes medications: AMaryl 4 mg every day, Novolin 70/30 14 units BID Current orders for Inpatient glycemic control: 70/30 7 units BID, Novolog 0-5  HS  HgbA1C - 8.7%  Inpatient Diabetes Program Recommendations:    Consider adding Novolog 0-9 units TID with meals  If CBGs > 180 mg/dL, consider increasing 16/10 to 9 units BID.  Watch glucose trends.  Thank you. Ailene Ards, RD, LDN, CDCES Inpatient Diabetes Coordinator [phone]

## 2022-09-29 DIAGNOSIS — I480 Paroxysmal atrial fibrillation: Secondary | ICD-10-CM | POA: Diagnosis not present

## 2022-09-29 DIAGNOSIS — N1832 Chronic kidney disease, stage 3b: Secondary | ICD-10-CM | POA: Diagnosis not present

## 2022-09-29 DIAGNOSIS — E875 Hyperkalemia: Secondary | ICD-10-CM | POA: Diagnosis not present

## 2022-09-29 DIAGNOSIS — N179 Acute kidney failure, unspecified: Secondary | ICD-10-CM | POA: Diagnosis not present

## 2022-09-29 LAB — GLUCOSE, CAPILLARY
Glucose-Capillary: 155 mg/dL — ABNORMAL HIGH (ref 70–99)
Glucose-Capillary: 57 mg/dL — ABNORMAL LOW (ref 70–99)
Glucose-Capillary: 98 mg/dL (ref 70–99)
Glucose-Capillary: 147 mg/dL — ABNORMAL HIGH (ref 70–99)

## 2022-09-29 MED ORDER — HYDROCHLOROTHIAZIDE 25 MG PO TABS: 25.0000 mg | ORAL_TABLET | Freq: Every day | ORAL | 2 refills | Status: DC

## 2022-09-29 MED ORDER — ONDANSETRON 4 MG PO TBDP: 4.0000 mg | ORAL_TABLET | Freq: Three times a day (TID) | ORAL | 0 refills | Status: DC | PRN

## 2022-09-29 MED ORDER — DILTIAZEM HCL ER COATED BEADS 240 MG PO CP24: 240.0000 mg | ORAL_CAPSULE | Freq: Every day | ORAL | 2 refills | Status: DC

## 2022-09-29 NOTE — Discharge Summary (Signed)
Physician Discharge Summary   Patient: Dawn Braun MRN: [id number] DOB: [birth year]  Admit date:     09/25/2022  Discharge date: 09/29/22  Discharge Physician: Brendia Sacks   PCP: Georganna Skeans, MD   Recommendations at discharge:   Hyperkalemia Now resolved. Likely secondary to ARB, HCTZ and AKI. ARB discontinued fo rnow  Essential hypertension Urgency resolved.   Benicar discontinued.   Continue diltiazem at increased dose, added HCTZ.     DM type 2 Stable.  Patient reports cannot afford [place].     Morbid obesity Body mass index is 43.27 kg/m.  Discharge Diagnoses: Principal Problem:   Hyperkalemia Active Problems:   Type 2 diabetes mellitus with obesity (HCC)   Paroxysmal atrial fibrillation (HCC)   AKI (acute kidney injury) (HCC)   CKD (chronic kidney disease) stage 3, GFR 30-59 ml/min (HCC)   Obesity, Class III, BMI 40-49.9 (morbid obesity) (HCC)  Resolved Problems:   * No resolved hospital problems. *  Hospital Course: 57 year old woman PMH including PAF, CKD, recently seen by cardiology and labs were obtained, found to be hyperkalemic and sent to the emergency department.  Recently had Benicar/hydrochlorothiazide increased and Zio patch was placed.  Condition gradually improved although hyperkalemia required several doses of Lokelma as well as IV fluids.  Potassium now normal.  Hospitalization complicated by vomiting which has now resolved.  Hyperkalemia Now resolved. Likely secondary to ARB, HCTZ and AKI.  AKI on CKD stage IIIb SCr baseline 1.4-1.5.  Creatinine slowly improving.  Now at or close to baseline.   Nausea and vomiting Resolved.  No abdominal pain.  Abdominal x-ray unremarkable. Self-limited.  Etiology unclear.  CMP and lipase was unremarkable.   No further evaluation suggested.   Hypertensive urgency  Essential hypertension Urgency resolved.   Benicar and hydrochlorothiazide discontinued.   Continue diltiazem at increased dose,  add HCTZ.     PAF  Currently in sinus rhythm, continue diltiazem, flecainide and Xarelto.   DM type 2 Stable.  Patient reports cannot afford [place].  Will message cardiology who recently prescribed this.   Morbid obesity Body mass index is 43.27 kg/m.  Consultants:  None  Procedures performed:  None   Disposition: Home Diet recommendation:  Discharge Diet Orders (From admission, onward)     Start     Ordered   09/29/22 0000  Diet - low sodium heart healthy        09/29/22 1135           Cardiac and Carb modified diet DISCHARGE MEDICATION: Allergies as of 09/29/2022       Reactions   Amoxicillin    Per pt "Rash"        Medication List     STOP taking these medications    cyclobenzaprine 10 MG tablet Commonly known as: FLEXERIL   Dilt-XR 180 MG 24 hr capsule Generic drug: diltiazem Replaced by: diltiazem 240 MG 24 hr capsule   empagliflozin 10 MG Tabs tablet Commonly known as: Jardiance   famotidine 20 MG tablet Commonly known as: PEPCID   lisinopril 40 MG tablet Commonly known as: ZESTRIL   olmesartan-hydrochlorothiazide 40-25 MG tablet Commonly known as: BENICAR HCT   Trulicity 1.5 MG/0.5ML Sopn Generic drug: Dulaglutide       TAKE these medications    atorvastatin 40 MG tablet Commonly known as: LIPITOR Take 1 tablet by mouth daily.   dapagliflozin propanediol 10 MG Tabs tablet Commonly known as: FARXIGA Take 1 tablet (10 mg total) by mouth daily before breakfast.  diltiazem 240 MG 24 hr capsule Commonly known as: CARDIZEM CD Take 1 capsule (240 mg total) by mouth daily. Start taking on: September 30, 2022 Replaces: Dilt-XR 180 MG 24 hr capsule   flecainide 100 MG tablet Commonly known as: TAMBOCOR Take 1 tablet by mouth twice daily   glimepiride 4 MG tablet Commonly known as: AMARYL Take 1 tablet by mouth daily.   hydrochlorothiazide 25 MG tablet Commonly known as: HYDRODIURIL Take 1 tablet (25 mg total) by mouth  daily.   Insulin Pen Needle 31G X 4 MM Misc 1 Container by Does not apply route 2 (two) times daily.   NovoLIN 70/30 Kwikpen (70-30) 100 UNIT/ML KwikPen Generic drug: insulin isophane & regular human KwikPen Inject 14 Units into the skin in the morning and at bedtime. What changed: Another medication with the same name was removed. Continue taking this medication, and follow the directions you see here.   ondansetron 4 MG disintegrating tablet Commonly known as: Zofran ODT Take 1 tablet (4 mg total) by mouth every 8 (eight) hours as needed for nausea or vomiting.   Xarelto 20 MG Tabs tablet Generic drug: rivaroxaban TAKE 1 TABLET BY MOUTH ONCE DAILY WITH  SUPPER        Follow-up Information     Georganna Skeans, MD. Schedule an appointment as soon as possible for a visit in 1 week(s).   Specialty: Family Medicine Contact information: [address] [place] [address] [phone]                Feels better Wants to eat and go home One small episode of emesis with Tylenol ingestion on empty stomach   Discharge Exam: Filed Weights   09/26/22 1100  Weight: 107.3 kg   Physical Exam Vitals reviewed.  Constitutional:      General: She is not in acute distress.    Appearance: She is not ill-appearing or toxic-appearing.  Cardiovascular:     Rate and Rhythm: Normal rate and regular rhythm.     Heart sounds: No murmur heard. Pulmonary:     Effort: Pulmonary effort is normal. No respiratory distress.     Breath sounds: No wheezing, rhonchi or rales.  Abdominal:     Palpations: Abdomen is soft.  Neurological:     Mental Status: She is alert.  Psychiatric:        Mood and Affect: Mood normal.        Behavior: Behavior normal.      Condition at discharge: good  The results of significant diagnostics from this hospitalization (including imaging, microbiology, ancillary and laboratory) are listed below for reference.   Imaging Studies: DG Abd  Portable 1V  Result Date: 09/28/2022 CLINICAL DATA:  Nausea and vomit EXAM: PORTABLE ABDOMEN - 1 VIEW COMPARISON:  03/26/2019 FINDINGS: Scattered large and small bowel gas is noted. Mild increased density is noted within the colon which may be related to ingested material. No obstructive changes are seen. No free air is noted. No bony abnormality is seen. IMPRESSION: No acute abnormality noted. Electronically Signed   By: Alcide Clever M.D.   On: 09/28/2022 12:01    Microbiology: Results for orders placed or performed in visit on 04/27/19  Urine Culture     Status: None   Collection Time: 04/27/19  2:03 PM   Specimen: Urine   UR  Result Value Ref Range Status   Urine Culture, Routine Final report  Final   Organism ID, Bacteria Comment  Final  Comment: Mixed urogenital flora Less than 10,000 colonies/mL     Labs: CBC: Recent Labs  Lab 09/25/22 1030 09/25/22 2125  WBC 7.6 8.6  HGB 11.0* 10.1*  HCT 34.8 32.4*  MCV 89 90.3  PLT 332 307   Basic Metabolic Panel: Recent Labs  Lab 09/25/22 1030 09/25/22 2125 09/26/22 1025 09/27/22 0419 09/27/22 1654 09/28/22 0900 09/28/22 2227  NA 143   < > 136 138 136 136 139  K 6.2*   < > 5.4* 5.5* 6.0* 5.3* 4.9  CL 113*   < > 110 112* 112* 111 113*  CO2 18*   < > 18* 19* 19* 20* 20*  GLUCOSE 78   < > 167* 182* 239* 230* 105*  BUN 45*   < > 42* 48* 42* 39* 30*  CREATININE 1.82*   < > 1.78* 1.86* 1.76* 1.66* 1.55*  CALCIUM 9.6   < > 8.7* 8.5* 8.5* 8.4* 8.8*  MG 2.2  --   --  2.1  --   --   --    < > = values in this interval not displayed.   Liver Function Tests: Recent Labs  Lab 09/28/22 2227  AST 20  ALT 15  ALKPHOS 107  BILITOT 0.6  PROT 6.0*  ALBUMIN 3.1*   CBG: Recent Labs  Lab 09/28/22 0744 09/28/22 1203 09/28/22 1631 09/28/22 2149 09/29/22 0736  GLUCAP 233* 191* 141* 101* 155*    Discharge time spent: greater than 30 minutes.  Signed: Brendia Sacks, MD [facility] 09/29/2022

## 2022-09-30 ENCOUNTER — Telehealth: Payer: Self-pay

## 2022-09-30 NOTE — Transitions of Care (Post Inpatient/ED Visit) (Signed)
09/30/2022  Name: Dawn Braun MRN: [id number] DOB: [birth year]  Today's TOC FU Call Status: Today's TOC FU Call Status:: Successful TOC FU Call Competed TOC FU Call Complete Date: 09/30/22  Transition Care Management Follow-up Telephone Call Date of Discharge: 09/29/22 Discharge Facility: [facility]) Type of Discharge: Inpatient Admission Primary Inpatient Discharge Diagnosis:: Hyperkalemia How have you been since you were released from the hospital?: Better Any questions or concerns?: No  Items Reviewed: Did you receive and understand the discharge instructions provided?: Yes Medications obtained,verified, and reconciled?: Yes (Medications Reviewed) Any new allergies since your discharge?: No Dietary orders reviewed?: Yes Do you have support at home?: Yes  Medications Reviewed Today: Medications Reviewed Today     Reviewed by Merleen Nicely, LPN (Licensed Practical Nurse) on 09/30/22 at 1208  Med List Status: <None>   Medication Order Taking? Sig Documenting Provider Last Dose Status Informant  atorvastatin (LIPITOR) 40 MG tablet [id number] Yes Take 1 tablet by mouth daily. [provider] Taking Active Self  dapagliflozin propanediol (FARXIGA) 10 MG TABS tablet [id number] No Take 1 tablet (10 mg total) by mouth daily before breakfast.  Patient not taking: Reported on 09/30/2022   Bunnie Domino Not Taking Active Self           Med Note Cyndie Chime, [facility]   Sat Sep 26, 2022  2:18 AM) Hasn't started  diltiazem (CARDIZEM CD) 240 MG 24 hr capsule [id number] Yes Take 1 capsule (240 mg total) by mouth daily. Standley Brooking, MD Taking Active   flecainide [facility]) 100 MG tablet [id number] Yes Take 1 tablet by mouth twice daily Jodelle Red, MD Taking Active Self  glimepiride (AMARYL) 4 MG tablet [id number] Yes Take 1 tablet by mouth daily. [provider] Taking Active Self  hydrochlorothiazide (HYDRODIURIL) 25 MG tablet  [id number] Yes Take 1 tablet (25 mg total) by mouth daily. Standley Brooking, MD Taking Active   insulin isophane & regular human KwikPen (NOVOLIN 70/30 KWIKPEN) (70-30) 100 UNIT/ML KwikPen [id number] Yes Inject 14 Units into the skin in the morning and at bedtime. [provider] Taking Active Self  Insulin Pen Needle 31G X 4 MM MISC [id number] Yes 1 Container by Does not apply route 2 (two) times daily. Anders Simmonds, PA-C Taking Active Self  ondansetron (ZOFRAN ODT) 4 MG disintegrating tablet [id number] Yes Take 1 tablet (4 mg total) by mouth every 8 (eight) hours as needed for nausea or vomiting. Standley Brooking, MD Taking Active   XARELTO 20 MG TABS tablet [id number] Yes TAKE 1 TABLET BY MOUTH ONCE DAILY WITH  SUPPER Fulp, Cammie, MD Taking Active Self            Home Care and Equipment/Supplies: Were Home Health Services Ordered?: No Any new equipment or medical supplies ordered?: No  Functional Questionnaire: Do you need assistance with bathing/showering or dressing?: No Do you need assistance with meal preparation?: No Do you need assistance with eating?: No Do you have difficulty maintaining continence: No Do you need assistance with getting out of bed/getting out of a chair/moving?: No Do you have difficulty managing or taking your medications?: No  Follow up appointments reviewed: PCP Follow-up appointment confirmed?: No (pt needs to be seen in 1 week no later than 10-07-22  for repeat labs- only wants to see Dr Andrey Campanile- will ask front desk to call pt and schedule appt) MD Provider Line [phone] Given: Yes Follow-up Provider: Dr Andrey Campanile [facility] Follow-up appointment confirmed?: Yes Date  of Specialist follow-up appointment?: 11/03/22 Follow-Up Specialty Provider:: Cardiologist Do you need transportation to your follow-up appointment?: No Do you understand care options if your condition(s) worsen?: Yes-patient verbalized  understanding    SIGNATURE  Woodfin Ganja LPN [facility] Nurse Health Advisor Direct Dial [phone]

## 2022-10-12 ENCOUNTER — Ambulatory Visit (HOSPITAL_COMMUNITY): Payer: BC Managed Care – PPO | Attending: Physician Assistant

## 2022-10-12 DIAGNOSIS — R002 Palpitations: Secondary | ICD-10-CM | POA: Diagnosis not present

## 2022-10-12 DIAGNOSIS — I5030 Unspecified diastolic (congestive) heart failure: Secondary | ICD-10-CM | POA: Diagnosis not present

## 2022-10-12 LAB — ECHOCARDIOGRAM COMPLETE
Area-P 1/2: 3.7 cm2
S' Lateral: 2.6 cm

## 2022-10-21 ENCOUNTER — Ambulatory Visit (HOSPITAL_COMMUNITY): Payer: BC Managed Care – PPO

## 2022-11-02 NOTE — Progress Notes (Unsigned)
Cardiology Office Note:  .   Date:  11/02/2022  ID:  Dawn Braun, DOB [birth year], MRN [id number] PCP: Dawn Skeans, MD  [facility] Providers Cardiologist:  Dawn Red, MD {  History of Present Illness: Dawn Braun   [name] is a 57 y.o. female with a past medical history of prior SBO status post surgery, uncontrolled type 2 diabetes mellitus with history of DKA, paroxysmal atrial fibrillation followed by the A-fib clinic, asthma, stage III CKD with history of AKI, and obesity here for follow-up appointment.  Originally seen by Dr. Cristal Braun 05/01/2019 at the request of her primary care for evaluation management of paroxysmal atrial fibrillation.  Was last seen by Dr. Cristal Braun 06/2021 and at that time history included urgent care visit 07/02/2018 6:02 days of persistent lower extremity edema.  Was treated with 40 mg of Lasix x 5 days.  Followed up with Dawn Fabian, NP 08/16/2020 and reported increased daytime somnolence with snoring.  Sleep study was ordered but not completed.  On 10/25/2020 presented to the ED with recurrent headaches for several weeks and hypertensive urgency (200s systolic).  Labs unremarkable other than hyperkalemia (potassium 5.2).  When she saw Dr. Cristal Braun for follow-up she was feeling good regarding her heart.  However, she is mostly concerned about her bilateral lower extremity edema.  At times she has been told that she looks swollen in her upper extremity and face as well.  Patient endorsed a tingling sensation in her feet.  This also occurs when she is driving.  Reported rapid weight gain.  When her diltiazem was increased she was unable to determine if her swelling had worsened.  She slept for most of the day prior weekend.  Started on Jardiance.  Insulin decreased.  Exercise recommended.  She was last seen by me 09/25/2022, she tells me that she is having some swelling in her lower legs.  Painful to touch.  Also feels she has swelling  in her stomach.  Face is puffy as well.  She is on Xarelto and not Eliquis.  She has been trying to limit her salt.  We discussed that some foods already have salt in them.  We also discussed increasing her HCTZ to help combat some of her fluid overload.  We can order some labs today for her as well as a ZIO and an echocardiogram to further workup her palpitations.  She does have a history of A-fib and feels like she is having episodes at home.  Dizziness, lightheadedness, and extreme fatigue.  She is not taking Jardiance because of the cost so organ to try to switch her to [place] to see if her insurance will cover that medication.  When her labs were obtained during her appointment she was found to be hyperkalemic and sent to the emergency department for treatment.  Had recently had increase Benicar/HCTZ.  Required several doses of Lokelma and IV fluids as well.  Potassium was normal at discharge.  Creatinine was slowly improving back to baseline (1.4-1.5).  Today, she***  ROS: Pertinent ROS in HPI  Studies Reviewed: Dawn Braun        EKG reviewed, normal sinus rhythm  Physical Exam:   VS:  LMP 12/24/2015 (Exact Date)    Wt Readings from Last 3 Encounters:  09/26/22 236 lb 8.9 oz (107.3 kg)  09/25/22 231 lb 9.6 oz (105.1 kg)  06/27/21 234 lb 4.8 oz (106.3 kg)    GEN: Well nourished, well developed in no acute distress NECK: No JVD; No  carotid bruits CARDIAC: RRR, no murmurs, rubs, gallops RESPIRATORY:  Clear to auscultation without rales, wheezing or rhonchi  ABDOMEN: Soft, non-tender, non-distended EXTREMITIES:  No edema; No deformity   ASSESSMENT AND PLAN: .   1. LE edema, abdominal edema, palpitations -BNP, BMP, CBC, TSH, mag  -Will order updated echocardiogram, last 04/2017 -Will plan to place a ZIO monitor on her today -Encouraged elevation of lower extremity and compression if needed  2. Essential hypertension -Diastolic elevated today at 90, systolic 130 -Encouraged her to keep  track of her weight  3.  Diabetes mellitus type 2 -A1c 8.6, uncontrolled -Will defer to her primary care for blood sugar control  4.  Paroxysmal atrial fibrillation -she has been lightheaded, dizzy and tired -will order a zio monior and echo, labs as above -close follow-up -She is on Xarelto 20 mg daily for stroke protection     Dispo: She will follow-up with me in 4 to 6 weeks  Signed, Dawn Dory, PA-C

## 2022-11-03 ENCOUNTER — Encounter: Payer: Self-pay | Admitting: Physician Assistant

## 2022-11-03 ENCOUNTER — Telehealth (HOSPITAL_COMMUNITY): Payer: Self-pay | Admitting: *Deleted

## 2022-11-03 ENCOUNTER — Telehealth: Payer: Self-pay | Admitting: Cardiology

## 2022-11-03 ENCOUNTER — Ambulatory Visit: Payer: Medicaid Other | Attending: Physician Assistant | Admitting: Physician Assistant

## 2022-11-03 VITALS — BP 128/70 | HR 81 | Ht 62.0 in | Wt 229.4 lb

## 2022-11-03 DIAGNOSIS — R0609 Other forms of dyspnea: Secondary | ICD-10-CM

## 2022-11-03 DIAGNOSIS — I10 Essential (primary) hypertension: Secondary | ICD-10-CM | POA: Diagnosis not present

## 2022-11-03 DIAGNOSIS — Z79899 Other long term (current) drug therapy: Secondary | ICD-10-CM

## 2022-11-03 DIAGNOSIS — R600 Localized edema: Secondary | ICD-10-CM

## 2022-11-03 DIAGNOSIS — R002 Palpitations: Secondary | ICD-10-CM

## 2022-11-03 DIAGNOSIS — I480 Paroxysmal atrial fibrillation: Secondary | ICD-10-CM | POA: Diagnosis not present

## 2022-11-03 DIAGNOSIS — E785 Hyperlipidemia, unspecified: Secondary | ICD-10-CM | POA: Diagnosis not present

## 2022-11-03 DIAGNOSIS — R5383 Other fatigue: Secondary | ICD-10-CM

## 2022-11-03 DIAGNOSIS — R0602 Shortness of breath: Secondary | ICD-10-CM

## 2022-11-03 LAB — BASIC METABOLIC PANEL
BUN/Creatinine Ratio: 23 (ref 9–23)
BUN: 43 mg/dL — ABNORMAL HIGH (ref 6–24)
CO2: 18 mmol/L — ABNORMAL LOW (ref 20–29)
Calcium: 9.6 mg/dL (ref 8.7–10.2)
Chloride: 112 mmol/L — ABNORMAL HIGH (ref 96–106)
Creatinine, Ser: 1.85 mg/dL — ABNORMAL HIGH (ref 0.57–1.00)
Glucose: 88 mg/dL (ref 70–99)
Potassium: 6 mmol/L (ref 3.5–5.2)
Sodium: 141 mmol/L (ref 134–144)
eGFR: 31 mL/min/{1.73_m2} — ABNORMAL LOW (ref >59)

## 2022-11-03 LAB — MAGNESIUM

## 2022-11-03 MED ORDER — FLECAINIDE ACETATE 100 MG PO TABS: 100.0000 mg | ORAL_TABLET | Freq: Two times a day (BID) | ORAL | 3 refills | Status: DC

## 2022-11-03 MED ORDER — HYDROCHLOROTHIAZIDE 25 MG PO TABS: 25.0000 mg | ORAL_TABLET | Freq: Every day | ORAL | 3 refills | Status: DC

## 2022-11-03 MED ORDER — ATORVASTATIN CALCIUM 40 MG PO TABS: 40.0000 mg | ORAL_TABLET | Freq: Every day | ORAL | 3 refills | Status: AC

## 2022-11-03 MED ORDER — RIVAROXABAN 20 MG PO TABS: 20.0000 mg | ORAL_TABLET | Freq: Every day | ORAL | 3 refills | Status: AC

## 2022-11-03 NOTE — Telephone Encounter (Signed)
Patient given detailed instructions per Myocardial Perfusion Study Information Sheet for the test on 11/04/22 Patient notified to arrive 15 minutes early and that it is imperative to arrive on time for appointment to keep from having the test rescheduled.  If you need to cancel or reschedule your appointment, please call the office within 24 hours of your appointment. . Patient verbalized understanding.Dawn Braun

## 2022-11-03 NOTE — Patient Instructions (Signed)
Medication Instructions:   Your physician recommends that you continue on your current medications as directed. Please refer to the Current Medication list given to you today.   *If you need a refill on your cardiac medications before your next appointment, please call your pharmacy*   Lab Work:  TODAY!!! MAG/BMET   If you have labs (blood work) drawn today and your tests are completely normal, you will receive your results only by: MyChart Message (if you have MyChart) OR A paper copy in the mail If you have any lab test that is abnormal or we need to change your treatment, we will call you to review the results.   Testing/Procedures:  You are scheduled for a Myocardial Perfusion Imaging Study on Wednesday, July 31at 7:15 am.   Please arrive 15 minutes prior to your appointment time for registration and insurance purposes.   The test will take approximately 3 to 4 hours to complete; you may bring reading material. If someone comes with you to your appointment, they will need to remain in the main lobby due to limited space in the testing area.     How to prepare for your Myocardial Perfusion test:  Hold your hydrochlorothiazide the am of test.    Do not eat or drink 3 hours prior to your test, except you may have water.    Do not consume products containing caffeine (regular or decaffeinated) 12 hours prior to your test (ex: coffee, chocolate, soda, tea)   Do bring a list of your current medications with you. If not listed below, you may take your medications as normal.   Bring any held medication to your appointment, as you may be required to take it once the test is complete.   Do wear comfortable clothes (no dresses or overalls) and walking shoes. Tennis shoes are preferred. No heels or open toed shoes.  Do not wear cologne, perfume, aftershave or lotions (deodorant is allowed).   If these instructions are not followed, you test will have to be rescheduled.   Please  report to [address] for your test. If you have questions or concerns about your appointment, please call the Nuclear Lab at [phone].  If you cannot keep your appointment, please provide 24 hour notification to the Nuclear lab to avoid a possible $50 charge to your account.       Follow-Up: At [facility], you and your health needs are our priority.  As part of our continuing mission to provide you with exceptional heart care, we have created designated Provider Care Teams.  These Care Teams include your primary Cardiologist (physician) and Advanced Practice Providers (APPs -  Physician Assistants and Nurse Practitioners) who all work together to provide you with the care you need, when you need it.  We recommend signing up for the patient portal called "MyChart".  Sign up information is provided on this After Visit Summary.  MyChart is used to connect with patients for Virtual Visits (Telemedicine).  Patients are able to view lab/test results, encounter notes, upcoming appointments, etc.  Non-urgent messages can be sent to your provider as well.   To learn more about what you can do with MyChart, go to [email or web address].    Your next appointment:   2 month(s)  Provider:   Jari Favre, PA-C     Then, Jodelle Red, MD will plan to see you again in 5 month(s).     Other Instructions  DASH Eating Plan DASH stands  for Dietary Approaches to Stop Hypertension. The DASH eating plan is a healthy eating plan that has been shown to: Lower high blood pressure (hypertension). Reduce your risk for type 2 diabetes, heart disease, and stroke. Help with weight loss. What are tips for following this plan? Reading food labels Check food labels for the amount of salt (sodium) per serving. Choose foods with less than 5 percent of the Daily Value (DV) of sodium. In general, foods with less than 300 milligrams (mg) of sodium per serving fit into this  eating plan. To find whole grains, look for the word "whole" as the first word in the ingredient list. Shopping Buy products labeled as "low-sodium" or "no salt added." Buy fresh foods. Avoid canned foods and pre-made or frozen meals. Cooking Try not to add salt when you cook. Use salt-free seasonings or herbs instead of table salt or sea salt. Check with your health care provider or pharmacist before using salt substitutes. Do not fry foods. Cook foods in healthy ways, such as baking, boiling, grilling, roasting, or broiling. Cook using oils that are good for your heart. These include olive, canola, avocado, soybean, and sunflower oil. Meal planning  Eat a balanced diet. This should include: 4 or more servings of fruits and 4 or more servings of vegetables each day. Try to fill half of your plate with fruits and vegetables. 6-8 servings of whole grains each day. 6 or less servings of lean meat, poultry, or fish each day. 1 oz is 1 serving. A 3 oz (85 g) serving of meat is about the same size as the palm of your hand. One egg is 1 oz (28 g). 2-3 servings of low-fat dairy each day. One serving is 1 cup (237 mL). 1 serving of nuts, seeds, or beans 5 times each week. 2-3 servings of heart-healthy fats. Healthy fats called omega-3 fatty acids are found in foods such as walnuts, flaxseeds, fortified milks, and eggs. These fats are also found in cold-water fish, such as sardines, salmon, and mackerel. Limit how much you eat of: Canned or prepackaged foods. Food that is high in trans fat, such as fried foods. Food that is high in saturated fat, such as fatty meat. Desserts and other sweets, sugary drinks, and other foods with added sugar. Full-fat dairy products. Do not salt foods before eating. Do not eat more than 4 egg yolks a week. Try to eat at least 2 vegetarian meals a week. Eat more home-cooked food and less restaurant, buffet, and fast food. Lifestyle When eating at a restaurant, ask  if your food can be made with less salt or no salt. If you drink alcohol: Limit how much you have to: 0-1 drink a day if you are female. 0-2 drinks a day if you are female. Know how much alcohol is in your drink. In the [place] one drink is one 12 oz bottle of beer (355 mL), one 5 oz glass of wine (148 mL), or one 1 oz glass of hard liquor (44 mL). General information Avoid eating more than 2,300 mg of salt a day. If you have hypertension, you may need to reduce your sodium intake to 1,500 mg a day. Work with your provider to stay at a healthy body weight or lose weight. Ask what the best weight range is for you. On most days of the week, get at least 30 minutes of exercise that causes your heart to beat faster. This may include walking, swimming, or biking. Work with your  provider or dietitian to adjust your eating plan to meet your specific calorie needs. What foods should I eat? Fruits All fresh, dried, or frozen fruit. Canned fruits that are in their natural juice and do not have sugar added to them. Vegetables Fresh or frozen vegetables that are raw, steamed, roasted, or grilled. Low-sodium or reduced-sodium tomato and vegetable juice. Low-sodium or reduced-sodium tomato sauce and tomato paste. Low-sodium or reduced-sodium canned vegetables. Grains Whole-grain or whole-wheat bread. Whole-grain or whole-wheat pasta. Brown rice. Orpah Cobb. Bulgur. Whole-grain and low-sodium cereals. Pita bread. Low-fat, low-sodium crackers. Whole-wheat flour tortillas. Meats and other proteins Skinless chicken or [place]. Ground chicken or [place]. Pork with fat trimmed off. Fish and seafood. Egg whites. Dried beans, peas, or lentils. Unsalted nuts, nut butters, and seeds. Unsalted canned beans. Lean cuts of beef with fat trimmed off. Low-sodium, lean precooked or cured meat, such as sausages or meat loaves. Dairy Low-fat (1%) or fat-free (skim) milk. Reduced-fat, low-fat, or fat-free cheeses. Nonfat,  low-sodium ricotta or cottage cheese. Low-fat or nonfat yogurt. Low-fat, low-sodium cheese. Fats and oils Soft margarine without trans fats. Vegetable oil. Reduced-fat, low-fat, or light mayonnaise and salad dressings (reduced-sodium). Canola, safflower, olive, avocado, soybean, and sunflower oils. Avocado. Seasonings and condiments Herbs. Spices. Seasoning mixes without salt. Other foods Unsalted popcorn and pretzels. Fat-free sweets. The items listed above may not be all the foods and drinks you can have. Talk to a dietitian to learn more. What foods should I avoid? Fruits Canned fruit in a light or heavy syrup. Fried fruit. Fruit in cream or butter sauce. Vegetables Creamed or fried vegetables. Vegetables in a cheese sauce. Regular canned vegetables that are not marked as low-sodium or reduced-sodium. Regular canned tomato sauce and paste that are not marked as low-sodium or reduced-sodium. Regular tomato and vegetable juices that are not marked as low-sodium or reduced-sodium. Rosita Fire. Olives. Grains Baked goods made with fat, such as croissants, muffins, or some breads. Dry pasta or rice meal packs. Meats and other proteins Fatty cuts of meat. Ribs. Fried meat. Tomasa Blase. Bologna, salami, and other precooked or cured meats, such as sausages or meat loaves, that are not lean and low in sodium. Fat from the back of a pig (fatback). Bratwurst. Salted nuts and seeds. Canned beans with added salt. Canned or smoked fish. Whole eggs or egg yolks. Chicken or [place] with skin. Dairy Whole or 2% milk, cream, and half-and-half. Whole or full-fat cream cheese. Whole-fat or sweetened yogurt. Full-fat cheese. Nondairy creamers. Whipped toppings. Processed cheese and cheese spreads. Fats and oils Butter. Stick margarine. Lard. Shortening. Ghee. Bacon fat. Tropical oils, such as coconut, palm kernel, or palm oil. Seasonings and condiments Onion salt, garlic salt, seasoned salt, table salt, and sea salt.  Worcestershire sauce. Tartar sauce. Barbecue sauce. Teriyaki sauce. Soy sauce, including reduced-sodium soy sauce. Steak sauce. Canned and packaged gravies. Fish sauce. Oyster sauce. Cocktail sauce. Store-bought horseradish. Ketchup. Mustard. Meat flavorings and tenderizers. Bouillon cubes. Hot sauces. Pre-made or packaged marinades. Pre-made or packaged taco seasonings. Relishes. Regular salad dressings. Other foods Salted popcorn and pretzels. The items listed above may not be all the foods and drinks you should avoid. Talk to a dietitian to learn more. Where to find more information National Heart, Lung, and Blood Institute (NHLBI): BuffaloDryCleaner.gl American Heart Association (AHA): heart.org Academy of Nutrition and Dietetics: eatright.org National Kidney Foundation (NKF): kidney.org This information is not intended to replace advice given to you by your health care provider. Make sure you discuss any questions  you have with your health care provider. Document Revised: 04/09/2022 Document Reviewed: 04/09/2022 Elsevier Patient Education  2024 Elsevier Inc. Adopting a Healthy Lifestyle.   Weight: Know what a healthy weight is for you (roughly BMI <25) and aim to maintain this. You can calculate your body mass index on your smart phone. Unfortunately, this is not the most accurate measure of healthy weight, but it is the simplest measurement to use. A more accurate measurement involves body scanning which measures lean muscle, fat tissue and bony density. We do not have this equipment at [facility].    Diet: Aim for 7+ servings of fruits and vegetables daily Limit animal fats in diet for cholesterol and heart health - choose grass fed whenever available Avoid highly processed foods (fast food burgers, tacos, fried chicken, pizza, hot dogs, french fries)  Saturated fat comes in the form of butter, lard, coconut oil, margarine, partially hydrogenated oils, and fat in meat. These increase your risk of  cardiovascular disease.  Use healthy plant oils, such as olive, canola, soy, corn, sunflower and peanut.  Whole foods such as fruits, vegetables and whole grains have fiber  Men need > 38 grams of fiber per day Women need > 25 grams of fiber per day  Load up on vegetables and fruits - one-half of your plate: Aim for color and variety, and remember that potatoes dont count. Go for whole grains - one-quarter of your plate: Whole wheat, barley, wheat berries, quinoa, oats, brown rice, and foods made with them. If you want pasta, go with whole wheat pasta. Protein power - one-quarter of your plate: Fish, chicken, beans, and nuts are all healthy, versatile protein sources. Limit red meat. You need carbohydrates for energy! The type of carbohydrate is more important than the amount. Choose carbohydrates such as vegetables, fruits, whole grains, beans, and nuts in the place of white rice, white pasta, potatoes (baked or fried), macaroni and cheese, cakes, cookies, and donuts.  If youre thirsty, drink water. Coffee and tea are good in moderation, but skip sugary drinks and limit milk and dairy products to one or two daily servings. Keep sugar intake at 6 teaspoons or 24 grams or LESS       Exercise: Aim for 150 min of moderate intensity exercise weekly for heart health, and weights twice weekly for bone health Stay active - any steps are better than no steps! Aim for 7-9 hours of sleep daily

## 2022-11-03 NOTE — Telephone Encounter (Signed)
Reach patient regarding [facility] alert of critical potassium level 6.0.  So, notably creatinine is 1.85 from 1.55 last assessed 1 month ago.  Seen in cardiology clinic earlier today due to dyspnea with exertion and fatigue.  Sometimes lightheaded with orthostatic changes.  Taking hydrochlorothiazide.  Also with slight lower extremity edema per her report.  Of note, had a recent hospitalization 1 month ago for hyperkalemia and required several doses of Lokelma and IV fluids.  Hospitalization lasted 4 days due to recurrent hyperkalemia.  I suggested that she should report to the emergency room for further evaluation and treatment given her recurrent hyperkalemia and difficulty with treatment in the past along with the symptoms that she is currently experiencing.  Discussed concerns for arrhythmias with hyperkalemia.  She expressed understanding.  I will alert her primary cardiologist.  Fidela Juneau, MD Cardiology Fellow

## 2022-11-04 ENCOUNTER — Ambulatory Visit (HOSPITAL_COMMUNITY): Payer: Medicaid Other | Attending: Cardiology

## 2022-11-04 ENCOUNTER — Telehealth: Payer: Self-pay

## 2022-11-04 DIAGNOSIS — R002 Palpitations: Secondary | ICD-10-CM | POA: Diagnosis present

## 2022-11-04 DIAGNOSIS — R0609 Other forms of dyspnea: Secondary | ICD-10-CM | POA: Diagnosis present

## 2022-11-04 DIAGNOSIS — E785 Hyperlipidemia, unspecified: Secondary | ICD-10-CM | POA: Insufficient documentation

## 2022-11-04 DIAGNOSIS — Z79899 Other long term (current) drug therapy: Secondary | ICD-10-CM | POA: Insufficient documentation

## 2022-11-04 DIAGNOSIS — R600 Localized edema: Secondary | ICD-10-CM | POA: Insufficient documentation

## 2022-11-04 DIAGNOSIS — I480 Paroxysmal atrial fibrillation: Secondary | ICD-10-CM | POA: Diagnosis present

## 2022-11-04 DIAGNOSIS — I10 Essential (primary) hypertension: Secondary | ICD-10-CM | POA: Diagnosis present

## 2022-11-04 DIAGNOSIS — E875 Hyperkalemia: Secondary | ICD-10-CM

## 2022-11-04 LAB — MYOCARDIAL PERFUSION IMAGING
LV dias vol: 62 mL (ref 46–106)
LV sys vol: 24 mL
Nuc Stress EF: 62 %
Peak HR: 101 {beats}/min
Rest HR: 71 {beats}/min
Rest Nuclear Isotope Dose: 10.2 mCi
SDS: 4
SRS: 4
SSS: 10
ST Depression (mm): 0 mm
Stress Nuclear Isotope Dose: 32.1 mCi
TID: 0.86

## 2022-11-04 MED ORDER — LOKELMA 10 G PO PACK: PACK | ORAL | 0 refills | Status: DC

## 2022-11-04 MED ORDER — REGADENOSON 0.4 MG/5ML IV SOLN
0.4000 mg | Freq: Once | INTRAVENOUS | Status: AC
Start: 2022-11-04 — End: 2022-11-04
  Administered 2022-11-04: 0.4 mg via INTRAVENOUS

## 2022-11-04 MED ORDER — TECHNETIUM TC 99M TETROFOSMIN IV KIT
32.1000 | PACK | Freq: Once | INTRAVENOUS | Status: AC | PRN
  Administered 2022-11-04: 32.1 via INTRAVENOUS

## 2022-11-04 MED ORDER — TECHNETIUM TC 99M TETROFOSMIN IV KIT
10.2000 | PACK | Freq: Once | INTRAVENOUS | Status: AC | PRN
  Administered 2022-11-04: 10.2 via INTRAVENOUS

## 2022-11-04 NOTE — Telephone Encounter (Signed)
Received report from AM staff regarding overnight outpatient calls on hyperkalemia with K 6.0, this patient was instructed to go to ED by overnight fellow. As of 8AM, patient has not show up in the ED. Attempted to call the patient to follow up on her decision, however she did not pick up the phone. Please follow up and make sure the patient followed cardiologist instruction.

## 2022-11-04 NOTE — Telephone Encounter (Signed)
Called pt to speak with her about going to the ER per providers recommendation. Pt states she did not make it to the ER as of yet. She is having a stress test done this am and then she has things she needs to tie up and will be going to [facility] this afternoon to the ER. Reminded pt the importance of going to the ER as directed by Dr. Cristal Deer. Pt verbalized understanding.

## 2022-11-04 NOTE — Telephone Encounter (Signed)
Triage reviewing labs-pt's K from yesterday when seeing [place], [place] came back 6.0. Pt recently hospitalized for same in June this year. Spoke with Tenny Craw, DOD, and [employer], Rph who advised that patient take Lokelma 10g two doses today, third in the morning, then come in tomorrow afternoon for repeat BMET (STAT) and Cortisol level. Called and spoke directly with patient who verbalized understanding of all instructions. She is calling [facility] to ensure that they have the [facility] in stock and will get labs done here tomorrow at approx 2pm.  Will call us back if any change to the plan.

## 2022-11-04 NOTE — Addendum Note (Signed)
Addended by: Lars Mage on: 11/04/2022 01:33 PM   Modules accepted: Orders

## 2022-11-05 ENCOUNTER — Ambulatory Visit: Payer: Medicaid Other | Attending: Internal Medicine

## 2022-11-05 DIAGNOSIS — Z79899 Other long term (current) drug therapy: Secondary | ICD-10-CM

## 2022-11-05 DIAGNOSIS — E875 Hyperkalemia: Secondary | ICD-10-CM

## 2022-11-05 LAB — BASIC METABOLIC PANEL
BUN/Creatinine Ratio: 21 (ref 9–23)
BUN: 44 mg/dL — ABNORMAL HIGH (ref 6–24)
CO2: 21 mmol/L (ref 20–29)
Calcium: 9.2 mg/dL (ref 8.7–10.2)
Chloride: 109 mmol/L — ABNORMAL HIGH (ref 96–106)
Creatinine, Ser: 2.06 mg/dL — ABNORMAL HIGH (ref 0.57–1.00)
Glucose: 140 mg/dL — ABNORMAL HIGH (ref 70–99)
Potassium: 5.7 mmol/L — ABNORMAL HIGH (ref 3.5–5.2)
Sodium: 140 mmol/L (ref 134–144)
eGFR: 27 mL/min/{1.73_m2} — ABNORMAL LOW (ref >59)

## 2022-11-06 ENCOUNTER — Telehealth: Payer: Self-pay | Admitting: *Deleted

## 2022-11-06 DIAGNOSIS — E875 Hyperkalemia: Secondary | ICD-10-CM

## 2022-11-06 DIAGNOSIS — N183 Chronic kidney disease, stage 3 unspecified: Secondary | ICD-10-CM

## 2022-11-06 MED ORDER — LOKELMA 10 G PO PACK: PACK | ORAL | 0 refills | Status: DC

## 2022-11-06 MED ORDER — HYDROCHLOROTHIAZIDE 25 MG PO TABS: 12.5000 mg | ORAL_TABLET | Freq: Every day | ORAL | 1 refills | Status: DC

## 2022-11-06 NOTE — Telephone Encounter (Signed)
Per Dr Tenny Craw patient should be referred to [facility].  Patient made aware. Referral placed.

## 2022-11-06 NOTE — Telephone Encounter (Signed)
Reviewed with Dr Tenny Craw and Thompson Caul is through the weekend.  Patient should also stop [place]. I spoke with patient and reviewed lab results/recommendations with her.  Prescription sent to [facility] on [address] She did not start Marcelline Deist due to cost Patient does not have a nephrologist

## 2022-11-06 NOTE — Telephone Encounter (Signed)
-----   Message from [place] sent at 11/06/2022  3:18 PM EDT ----- Cortisol is OK K is still high at 5.7  Better than 6    Cr 2.0 Cut hydrochlorothiazide to 12.5 mg  Needs to continue lokelma bid  10 mg      Follow up BMET on Monday

## 2022-11-09 ENCOUNTER — Ambulatory Visit: Payer: Medicaid Other | Attending: Cardiovascular Disease

## 2022-11-09 DIAGNOSIS — E875 Hyperkalemia: Secondary | ICD-10-CM

## 2022-11-11 ENCOUNTER — Other Ambulatory Visit: Payer: Self-pay

## 2022-11-11 MED ORDER — LOKELMA 10 G PO PACK: 10.0000 g | PACK | Freq: Two times a day (BID) | ORAL | 0 refills | Status: DC

## 2022-12-27 NOTE — Progress Notes (Unsigned)
Cardiology Office Note:  .   Date:  12/28/2022  ID:  Dawn Braun, DOB Dawn Braun, Dawn Braun, MRN [id number] PCP: Georganna Skeans, MD  [facility] Providers Cardiologist:  Jodelle Red, MD {  History of Present Illness: [name]   Dawn Braun is a 57 y.o. female with a past medical history of prior SBO status post surgery, uncontrolled type 2 diabetes mellitus with history of DKA, paroxysmal atrial fibrillation followed by the A-fib clinic, asthma, stage III CKD with history of AKI, and obesity here for follow-up appointment.  Originally seen by Dr. Cristal Deer 1/Braun/2021 at the request of her primary care for evaluation management of paroxysmal atrial fibrillation.  Was last seen by Dr. Cristal Deer 06/2021 and at that time history included urgent care visit 07/02/2018 6:02 days of persistent lower extremity edema.  Was treated with 40 mg of Lasix x 5 days.  Followed up with Edd Fabian, NP 08/16/2020 and reported increased daytime somnolence with snoring.  Sleep study was ordered but not completed.  On 10/25/2020 presented to the ED with recurrent headaches for several weeks and hypertensive urgency (200s systolic).  Labs unremarkable other than hyperkalemia (potassium 5.2).  When she saw Dr. Cristal Deer for follow-up she was feeling good regarding her heart.  However, she is mostly concerned about her bilateral lower extremity edema.  At times she has been told that she looks swollen in her upper extremity and face as well.  Patient endorsed a tingling sensation in her feet.  This also occurs when she is driving.  Reported rapid weight gain.  When her diltiazem was increased she was unable to determine if her swelling had worsened.  She slept for most of the day prior weekend.  Started on Jardiance.  Insulin decreased.  Exercise recommended.  She was last seen by me 09/25/2022, she tells me that she is having some swelling in her lower legs.  Painful to touch.  Also feels she has swelling  in her stomach.  Face is puffy as well.  She is on Xarelto and not Eliquis.  She has been trying to limit her salt.  We discussed that some foods already have salt in them.  We also discussed increasing her HCTZ to help combat some of her fluid overload.  We can order some labs today for her as well as a ZIO and an echocardiogram to further workup her palpitations.  She does have a history of A-fib and feels like she is having episodes at home.  Dizziness, lightheadedness, and extreme fatigue.  She is not taking Jardiance because of the cost so organ to try to switch her to [place] to see if her insurance will cover that medication.  When her labs were obtained during her appointment she was found to be hyperkalemic and sent to the emergency department for treatment.  Had recently had increase Benicar/HCTZ.  Required several doses of Lokelma and IV fluids as well.  Potassium was normal at discharge.  Creatinine was slowly improving back to baseline (1.4-1.5).  She was seen by me 11/03/2022, she tells me that when she is just walking she is so tired.  Sometimes gets lightheaded when she bends over to get clothes out of the dryer.  Some shortness of breath but mostly feels winded.  She was recently in the hospital for hyperkalemia.  Did not take her increased dose of Benicar/HCTZ.  Ultimately was discharged with potassium in the appropriate range.  She tells me today she needs left knee surgery and is having  lower back pain.  Afraid it is her kidneys.  She states in the morning she is hunched over going to the bathroom.  No chest pain.  We reviewed her most recent echocardiogram and monitor which did not reveal any culprit for her symptoms.  We did discuss doing a stress test to rule out CAD given her history.  Today, she tells me that she is still dealing with positional dizziness/lightheadedness.  Typically when she bends over and stands up quickly she experiences this.  She is trying to drink more water.  She  remains on Lokelma 10 mg twice a day and no one has rechecked her potassium.  We will order this today.  Her only blood pressure pill that she is taking is her Cardizem which is also her rhythm control.  We discussed decreasing the dose slightly and adding an as needed "rescue" Cardizem to take if her rhythm starts acting up.  We also recommended working up potential inner ear disorder with PCP.  Reports no shortness of breath nor dyspnea on exertion. Reports no chest pain, pressure, or tightness. No edema, orthopnea, PND.   ROS: Pertinent ROS in HPI  Studies Reviewed: .        Cardiac monitor July 2024 Patch Wear Time:  11 days and 12 hours (2024-06-21T10:01:26-0400 to 2024-07-02T22:48:55-0400)   Patient had a min HR of 47 bpm, max HR of 144 bpm, and avg HR of 85 bpm. Predominant underlying rhythm was Sinus Rhythm. 1 run of Supraventricular Tachycardia occurred lasting 9 beats with a max rate of 105 bpm (avg 97 bpm). Second Degree AV Block-Mobitz  I (Wenckebach) was present. Isolated SVEs were rare (<1.0%), SVE Couplets were rare (<1.0%), and SVE Triplets were rare (<1.0%). Isolated VEs were rare (<1.0%), VE Couplets were rare (<1.0%), and no VE Triplets were present. Ventricular Trigeminy was  present.   Physical Exam:   VS:  BP 128/76   Pulse 77   Ht 5\' 2"  (1.575 m)   Wt 226 lb 6.4 oz (102.7 kg)   LMP 12/24/2015 (Exact Date)   SpO2 99%   BMI 41.41 kg/m    Wt Readings from Last 3 Encounters:  12/28/22 226 lb 6.4 oz (102.7 kg)  11/03/22 229 lb 6.4 oz (104.1 kg)  09/26/22 236 lb 8.9 oz (107.3 kg)    GEN: Well nourished, well developed in no acute distress NECK: No JVD; No carotid bruits CARDIAC: RRR, no murmurs, rubs, gallops RESPIRATORY:  Clear to auscultation without rales, wheezing or rhonchi  ABDOMEN: Soft, non-tender, non-distended EXTREMITIES:  No edema; No deformity   ASSESSMENT AND PLAN: .   1.  Fatigue/SOB/lightheadedness -We reviewed stress test, echocardiogram, and  ZIO which were all unremarkable -We decreased her Cardizem medication and asked her to follow-up with her PCP  2. Essential hypertension -BP well-controlled today, 120/70 -Continue current medications which include Lipitor 40 mg daily, Farxiga 10 mg daily, Cardizem 180mg  daily, Cardizem 30 mg as needed for palpitations, flecainide 100 mg twice a day, Xarelto 20 mg daily -Encouraged her to keep track of her weight  3.  Diabetes mellitus type 2 -A1c 8.7, uncontrolled -Will defer to her primary care for blood sugar control  4.  Paroxysmal atrial fibrillation -ZIO monitor results above, no atrial fibrillation and no clear culprit for symptoms -Echocardiogram results reviewed with patient -She is on Xarelto 20 mg daily for stroke protection -Check BMP and mag     Dispo: We recommended follow-up in 3 to 4 months with myself or  Dr. Cristal Deer  Signed, Sharlene Dory, PA-C

## 2022-12-28 ENCOUNTER — Ambulatory Visit: Payer: Medicaid Other | Attending: Physician Assistant | Admitting: Physician Assistant

## 2022-12-28 ENCOUNTER — Encounter: Payer: Self-pay | Admitting: Physician Assistant

## 2022-12-28 VITALS — BP 128/76 | HR 77 | Ht 62.0 in | Wt 226.4 lb

## 2022-12-28 DIAGNOSIS — I480 Paroxysmal atrial fibrillation: Secondary | ICD-10-CM

## 2022-12-28 DIAGNOSIS — E1169 Type 2 diabetes mellitus with other specified complication: Secondary | ICD-10-CM

## 2022-12-28 DIAGNOSIS — R42 Dizziness and giddiness: Secondary | ICD-10-CM

## 2022-12-28 DIAGNOSIS — R0602 Shortness of breath: Secondary | ICD-10-CM | POA: Diagnosis not present

## 2022-12-28 DIAGNOSIS — I10 Essential (primary) hypertension: Secondary | ICD-10-CM | POA: Diagnosis not present

## 2022-12-28 DIAGNOSIS — R5383 Other fatigue: Secondary | ICD-10-CM | POA: Diagnosis not present

## 2022-12-28 DIAGNOSIS — Z79899 Other long term (current) drug therapy: Secondary | ICD-10-CM

## 2022-12-28 DIAGNOSIS — E669 Obesity, unspecified: Secondary | ICD-10-CM

## 2022-12-28 MED ORDER — DILTIAZEM HCL ER COATED BEADS 180 MG PO CP24: 180.0000 mg | ORAL_CAPSULE | Freq: Every day | ORAL | 3 refills | Status: AC

## 2022-12-28 MED ORDER — DILTIAZEM HCL 30 MG PO TABS: 30.0000 mg | ORAL_TABLET | Freq: Two times a day (BID) | ORAL | 3 refills | Status: AC

## 2022-12-28 NOTE — Patient Instructions (Addendum)
Medication Instructions:  Start diltiazem 30 mg twice daily as needed for palpitations  Decrease diltiazem to 180 mg daily *If you need a refill on your cardiac medications before your next appointment, please call your pharmacy*  Lab Work: BMET, MAG-TODAY If you have labs (blood work) drawn today and your tests are completely normal, you will receive your results only by: MyChart Message (if you have MyChart) OR A paper copy in the mail If you have any lab test that is abnormal or we need to change your treatment, we will call you to review the results.  Follow-Up: At [facility], you and your health needs are our priority.  As part of our continuing mission to provide you with exceptional heart care, we have created designated Provider Care Teams.  These Care Teams include your primary Cardiologist (physician) and Advanced Practice Providers (APPs -  Physician Assistants and Nurse Practitioners) who all work together to provide you with the care you need, when you need it.  Your next appointment:   3-4 month(s)  Provider:   Jodelle Red, MD  or Jari Favre, PA-C       Other Instructions Check your blood pressure daily, 1 hr after morning medications for 2 weeks, keep a log and send [place] the readings through mychart at the end of the 2 weeks.    Low-Sodium Eating Plan Salt (sodium) helps you keep a healthy balance of fluids in your body. Too much sodium can raise your blood pressure. It can also cause fluid and waste to be held in your body. Your health care provider or dietitian may recommend a low-sodium eating plan if you have high blood pressure (hypertension), kidney disease, liver disease, or heart failure. Eating less sodium can help lower your blood pressure and reduce swelling. It can also protect your heart, liver, and kidneys. What are tips for following this plan? Reading food labels  Check food labels for the amount of sodium per serving. If you eat more  than one serving, you must multiply the listed amount by the number of servings. Choose foods with less than 140 milligrams (mg) of sodium per serving. Avoid foods with 300 mg of sodium or more per serving. Always check how much sodium is in a product, even if the label says "unsalted" or "no salt added." Shopping  Buy products labeled as "low-sodium" or "no salt added." Buy fresh foods. Avoid canned foods and pre-made or frozen meals. Avoid canned, cured, or processed meats. Buy breads that have less than 80 mg of sodium per slice. Cooking  Eat more home-cooked food. Try to eat less restaurant, buffet, and fast food. Try not to add salt when you cook. Use salt-free seasonings or herbs instead of table salt or sea salt. Check with your provider or pharmacist before using salt substitutes. Cook with plant-based oils, such as canola, sunflower, or olive oil. Meal planning When eating at a restaurant, ask if your food can be made with less salt or no salt. Avoid dishes labeled as brined, pickled, cured, or smoked. Avoid dishes made with soy sauce, miso, or teriyaki sauce. Avoid foods that have monosodium glutamate (MSG) in them. MSG may be added to some restaurant food, sauces, soups, bouillon, and canned foods. Make meals that can be grilled, baked, poached, roasted, or steamed. These are often made with less sodium. General information Try to limit your sodium intake to 1,500-2,300 mg each day, or the amount told by your provider. What foods should I eat?  Fruits Fresh, frozen, or canned fruit. Fruit juice. Vegetables Fresh or frozen vegetables. "No salt added" canned vegetables. "No salt added" tomato sauce and paste. Low-sodium or reduced-sodium tomato and vegetable juice. Grains Low-sodium cereals, such as oats, puffed wheat and rice, and shredded wheat. Low-sodium crackers. Unsalted rice. Unsalted pasta. Low-sodium bread. Whole grain breads and whole grain pasta. Meats and other  proteins Fresh or frozen meat, poultry, seafood, and fish. These should have no added salt. Low-sodium canned tuna and salmon. Unsalted nuts. Dried peas, beans, and lentils without added salt. Unsalted canned beans. Eggs. Unsalted nut butters. Dairy Milk. Soy milk. Cheese that is naturally low in sodium, such as ricotta cheese, fresh mozzarella, or Swiss cheese. Low-sodium or reduced-sodium cheese. Cream cheese. Yogurt. Seasonings and condiments Fresh and dried herbs and spices. Salt-free seasonings. Low-sodium mustard and ketchup. Sodium-free salad dressing. Sodium-free light mayonnaise. Fresh or refrigerated horseradish. Lemon juice. Vinegar. Other foods Homemade, reduced-sodium, or low-sodium soups. Unsalted popcorn and pretzels. Low-salt or salt-free chips. The items listed above may not be all the foods and drinks you can have. Talk to a dietitian to learn more. What foods should I avoid? Vegetables Sauerkraut, pickled vegetables, and relishes. Olives. [place] fries. Onion rings. Regular canned vegetables, except low-sodium or reduced-sodium items. Regular canned tomato sauce and paste. Regular tomato and vegetable juice. Frozen vegetables in sauces. Grains Instant hot cereals. Bread stuffing, pancake, and biscuit mixes. Croutons. Seasoned rice or pasta mixes. Noodle soup cups. Boxed or frozen macaroni and cheese. Regular salted crackers. Self-rising flour. Meats and other proteins Meat or fish that is salted, canned, smoked, spiced, or pickled. Precooked or cured meat, such as sausages or meat loaves. Tomasa Blase. Ham. Pepperoni. Hot dogs. Corned beef. Chipped beef. Salt pork. Jerky. Pickled herring, anchovies, and sardines. Regular canned tuna. Salted nuts. Dairy Processed cheese and cheese spreads. Hard cheeses. Cheese curds. Blue cheese. Feta cheese. String cheese. Regular cottage cheese. Buttermilk. Canned milk. Fats and oils Salted butter. Regular margarine. Ghee. Bacon fat. Seasonings and  condiments Onion salt, garlic salt, seasoned salt, table salt, and sea salt. Canned and packaged gravies. Worcestershire sauce. Tartar sauce. Barbecue sauce. Teriyaki sauce. Soy sauce, including reduced-sodium soy sauce. Steak sauce. Fish sauce. Oyster sauce. Cocktail sauce. Horseradish that you find on the shelf. Regular ketchup and mustard. Meat flavorings and tenderizers. Bouillon cubes. Hot sauce. Pre-made or packaged marinades. Pre-made or packaged taco seasonings. Relishes. Regular salad dressings. Salsa. Other foods Salted popcorn and pretzels. Corn chips and puffs. Potato and tortilla chips. Canned or dried soups. Pizza. Frozen entrees and pot pies. The items listed above may not be all the foods and drinks you should avoid. Talk to a dietitian to learn more. This information is not intended to replace advice given to you by your health care provider. Make sure you discuss any questions you have with your health care provider. Document Revised: 04/09/2022 Document Reviewed: 04/09/2022 Elsevier Patient Education  2024 [employer] Heart-Healthy Eating Plan Many factors influence your heart health, including eating and exercise habits. Heart health is also called coronary health. Coronary risk increases with abnormal blood fat (lipid) levels. A heart-healthy eating plan includes limiting unhealthy fats, increasing healthy fats, limiting salt (sodium) intake, and making other diet and lifestyle changes. What is my plan? Your health care provider may recommend that: You limit your fat intake to _________% or less of your total calories each day. You limit your saturated fat intake to _________% or less of your total calories each day. You limit the  amount of cholesterol in your diet to less than _________ mg per day. You limit the amount of sodium in your diet to less than _________ mg per day. What are tips for following this plan? Cooking Cook foods using methods other than frying. Baking,  boiling, grilling, and broiling are all good options. Other ways to reduce fat include: Removing the skin from poultry. Removing all visible fats from meats. Steaming vegetables in water or broth. Meal planning  At meals, imagine dividing your plate into fourths: Fill one-half of your plate with vegetables and green salads. Fill one-fourth of your plate with whole grains. Fill one-fourth of your plate with lean protein foods. Eat 2-4 cups of vegetables per day. One cup of vegetables equals 1 cup (91 g) broccoli or cauliflower florets, 2 medium carrots, 1 large bell pepper, 1 large sweet potato, 1 large tomato, 1 medium white potato, 2 cups (150 g) raw leafy greens. Eat 1-2 cups of fruit per day. One cup of fruit equals 1 small apple, 1 large banana, 1 cup (237 g) mixed fruit, 1 large orange,  cup (82 g) dried fruit, 1 cup (240 mL) 100% fruit juice. Eat more foods that contain soluble fiber. Examples include apples, broccoli, carrots, beans, peas, and barley. Aim to get 25-30 g of fiber per day. Increase your consumption of legumes, nuts, and seeds to 4-5 servings per week. One serving of dried beans or legumes equals  cup (90 g) cooked, 1 serving of nuts is  oz (12 almonds, 24 pistachios, or 7 walnut halves), and 1 serving of seeds equals  oz (8 g). Fats Choose healthy fats more often. Choose monounsaturated and polyunsaturated fats, such as olive and canola oils, avocado oil, flaxseeds, walnuts, almonds, and seeds. Eat more omega-3 fats. Choose salmon, mackerel, sardines, tuna, flaxseed oil, and ground flaxseeds. Aim to eat fish at least 2 times each week. Check food labels carefully to identify foods with trans fats or high amounts of saturated fat. Limit saturated fats. These are found in animal products, such as meats, butter, and cream. Plant sources of saturated fats include palm oil, palm kernel oil, and coconut oil. Avoid foods with partially hydrogenated oils in them. These contain  trans fats. Examples are stick margarine, some tub margarines, cookies, crackers, and other baked goods. Avoid fried foods. General information Eat more home-cooked food and less restaurant, buffet, and fast food. Limit or avoid alcohol. Limit foods that are high in added sugar and simple starches such as foods made using white refined flour (white breads, pastries, sweets). Lose weight if you are overweight. Losing just 5-10% of your body weight can help your overall health and prevent diseases such as diabetes and heart disease. Monitor your sodium intake, especially if you have high blood pressure. Talk with your health care provider about your sodium intake. Try to incorporate more vegetarian meals weekly. What foods should I eat? Fruits All fresh, canned (in natural juice), or frozen fruits. Vegetables Fresh or frozen vegetables (raw, steamed, roasted, or grilled). Green salads. Grains Most grains. Choose whole wheat and whole grains most of the time. Rice and pasta, including brown rice and pastas made with whole wheat. Meats and other proteins Lean, well-trimmed beef, veal, pork, and lamb. Chicken and [place] without skin. All fish and shellfish. Wild duck, rabbit, pheasant, and venison. Egg whites or low-cholesterol egg substitutes. Dried beans, peas, lentils, and tofu. Seeds and most nuts. Dairy Low-fat or nonfat cheeses, including ricotta and mozzarella. Skim or 1% milk (  liquid, powdered, or evaporated). Buttermilk made with low-fat milk. Nonfat or low-fat yogurt. Fats and oils Non-hydrogenated (trans-free) margarines. Vegetable oils, including soybean, sesame, sunflower, olive, avocado, peanut, safflower, corn, canola, and cottonseed. Salad dressings or mayonnaise made with a vegetable oil. Beverages Water (mineral or sparkling). Coffee and tea. Unsweetened ice tea. Diet beverages. Sweets and desserts Sherbet, gelatin, and fruit ice. Small amounts of dark chocolate. Limit all  sweets and desserts. Seasonings and condiments All seasonings and condiments. The items listed above may not be a complete list of foods and beverages you can eat. Contact a dietitian for more options. What foods should I avoid? Fruits Canned fruit in heavy syrup. Fruit in cream or butter sauce. Fried fruit. Limit coconut. Vegetables Vegetables cooked in cheese, cream, or butter sauce. Fried vegetables. Grains Breads made with saturated or trans fats, oils, or whole milk. Croissants. Sweet rolls. Donuts. High-fat crackers, such as cheese crackers and chips. Meats and other proteins Fatty meats, such as hot dogs, ribs, sausage, bacon, rib-eye roast or steak. High-fat deli meats, such as salami and bologna. Caviar. Domestic duck and goose. Organ meats, such as liver. Dairy Cream, sour cream, cream cheese, and creamed cottage cheese. Whole-milk cheeses. Whole or 2% milk (liquid, evaporated, or condensed). Whole buttermilk. Cream sauce or high-fat cheese sauce. Whole-milk yogurt. Fats and oils Meat fat, or shortening. Cocoa butter, hydrogenated oils, palm oil, coconut oil, palm kernel oil. Solid fats and shortenings, including bacon fat, salt pork, lard, and butter. Nondairy cream substitutes. Salad dressings with cheese or sour cream. Beverages Regular sodas and any drinks with added sugar. Sweets and desserts Frosting. Pudding. Cookies. Cakes. Pies. Milk chocolate or white chocolate. Buttered syrups. Full-fat ice cream or ice cream drinks. The items listed above may not be a complete list of foods and beverages to avoid. Contact a dietitian for more information. Summary Heart-healthy meal planning includes limiting unhealthy fats, increasing healthy fats, limiting salt (sodium) intake and making other diet and lifestyle changes. Lose weight if you are overweight. Losing just 5-10% of your body weight can help your overall health and prevent diseases such as diabetes and heart disease. Focus on  eating a balance of foods, including fruits and vegetables, low-fat or nonfat dairy, lean protein, nuts and legumes, whole grains, and heart-healthy oils and fats. This information is not intended to replace advice given to you by your health care provider. Make sure you discuss any questions you have with your health care provider. Document Revised: 04/28/2021 Document Reviewed: 04/28/2021 Elsevier Patient Education  2024 [employer].

## 2022-12-29 LAB — BASIC METABOLIC PANEL
BUN/Creatinine Ratio: 21 (ref 9–23)
BUN: 41 mg/dL — ABNORMAL HIGH (ref 6–24)
CO2: 18 mmol/L — ABNORMAL LOW (ref 20–29)
Calcium: 9.3 mg/dL (ref 8.7–10.2)
Chloride: 109 mmol/L — ABNORMAL HIGH (ref 96–106)
Creatinine, Ser: 2 mg/dL — ABNORMAL HIGH (ref 0.57–1.00)
Glucose: 75 mg/dL (ref 70–99)
Potassium: 5.7 mmol/L — ABNORMAL HIGH (ref 3.5–5.2)
Sodium: 141 mmol/L (ref 134–144)
eGFR: 28 mL/min/{1.73_m2} — ABNORMAL LOW (ref >59)

## 2022-12-29 LAB — MAGNESIUM: Magnesium: 1.9 mg/dL (ref 1.6–2.3)

## 2022-12-31 NOTE — Progress Notes (Signed)
Attempted to reach pt to discuss but was unsuccessful. Will try again later.

## 2023-03-02 LAB — LAB REPORT - SCANNED: EGFR: 31

## 2023-03-03 ENCOUNTER — Encounter: Payer: Self-pay | Admitting: Internal Medicine

## 2023-03-23 ENCOUNTER — Ambulatory Visit (HOSPITAL_BASED_OUTPATIENT_CLINIC_OR_DEPARTMENT_OTHER): Payer: Medicaid Other | Admitting: Cardiology

## 2023-03-23 ENCOUNTER — Encounter (HOSPITAL_BASED_OUTPATIENT_CLINIC_OR_DEPARTMENT_OTHER): Payer: Self-pay | Admitting: Cardiology

## 2023-03-23 VITALS — BP 136/70 | HR 81 | Ht 63.0 in | Wt 224.0 lb

## 2023-03-23 DIAGNOSIS — I10 Essential (primary) hypertension: Secondary | ICD-10-CM

## 2023-03-23 DIAGNOSIS — I480 Paroxysmal atrial fibrillation: Secondary | ICD-10-CM

## 2023-03-23 DIAGNOSIS — E1169 Type 2 diabetes mellitus with other specified complication: Secondary | ICD-10-CM

## 2023-03-23 DIAGNOSIS — R5383 Other fatigue: Secondary | ICD-10-CM | POA: Diagnosis not present

## 2023-03-23 DIAGNOSIS — E669 Obesity, unspecified: Secondary | ICD-10-CM

## 2023-03-23 DIAGNOSIS — R0609 Other forms of dyspnea: Secondary | ICD-10-CM

## 2023-03-23 DIAGNOSIS — R6889 Other general symptoms and signs: Secondary | ICD-10-CM

## 2023-03-23 MED ORDER — OZEMPIC (1 MG/DOSE) 4 MG/3ML ~~LOC~~ SOPN
PEN_INJECTOR | SUBCUTANEOUS | 5 refills | Status: DC
Start: 2023-03-23 — End: 2023-06-10

## 2023-03-23 NOTE — Progress Notes (Signed)
Cardiology Office Note:  .   Date:  03/23/2023  ID:  Dawn Braun, DOB 1965-06-13, MRN [id number] PCP: Georganna Skeans, MD  [facility] Providers Cardiologist:  Jodelle Red, MD {  History of Present Illness: Dawn Braun Kitchen   Dawn Braun is a 57 y.o. female with a hx of labile hypertension, prior SBO s/p surgery, uncontrolled type 2 diabetes with history of DKA, paroxysmal atrial fibrillation followed by afib clinic, asthma, stage 3 CKD with history of AKI, obesity who is seen for follow up today. I initially saw her 05/01/19 as a new consult at the request of Georganna Skeans, MD for the evaluation and management of paroxysmal atrial fibrillation.   Pertinent CV history: Admitted 09/2022 for hyperkalemia, ARB stopped. Presented with hypertensive urgency. Has chronic issues with LE edema, lightheadedness, fatigue. Monitor 10/2022 unremarkable. Echo 10/2022 EF 70-75%, mild LVH, G1DD, normal RV, RAP 3, no significant valve disease. Stress test 10/2022 without ischemia.  Today: Feels exhausted all the time. Lives in a three story townhouse, has to sit on the landing and rest between flights. Can get her HR to 110s-120s with activity. Monitor, echo, lexiscan unremarkable.  Following with [facility], Dr. Glenna Fellows. Has been on lokelma BID for months. Off all meds that can raise potassium for months. Following closely with blood draws. Doesn't eat high potassium foods. Brother had kidney transplant, mother was on dialysis. Wants to avoid this.  Has been on Ozempic for 2 mos, no side effects. On 0.5 mg weekly dose. No weight loss. Reports sugars have been well controlled.  ROS: Denies chest pain, shortness of breath at rest or with normal exertion. No PND, orthopnea, LE edema or unexpected weight gain. No syncope or palpitations. ROS otherwise negative except as noted.   Studies Reviewed: Dawn Braun Kitchen    EKG:       Physical Exam:   VS:  LMP 12/24/2015 (Exact Date)    Wt Readings from Last 3  Encounters:  12/28/22 226 lb 6.4 oz (102.7 kg)  11/03/22 229 lb 6.4 oz (104.1 kg)  09/26/22 236 lb 8.9 oz (107.3 kg)    GEN: Well nourished, well developed in no acute distress HEENT: Normal, moist mucous membranes NECK: No JVD CARDIAC: regular rhythm, normal S1 and S2, no rubs or gallops. No murmur. VASCULAR: Radial and DP pulses 2+ bilaterally. No carotid bruits RESPIRATORY:  Clear to auscultation without rales, wheezing or rhonchi  ABDOMEN: Soft, non-tender, non-distended MUSCULOSKELETAL:  Ambulates independently SKIN: Warm and dry, no edema NEUROLOGIC:  Alert and oriented x 3. No focal neuro deficits noted. PSYCHIATRIC:  Normal affect    ASSESSMENT AND PLAN: .    Exercise intolerance Dyspnea on exertion Fatigue -workup from a cardiac standpoint has been unremarkable. Reviewed at length today.  -discussed CPX testing today, she is amenable -if CPX unremarkable, with her myriad symptoms and unclear etiology, recommend she discuss with her PCP whether there is additional workup (ie autoimmune, etc) that might be helpful  Hyperkalemia -following with Dr. Glenna Fellows at [facility] -still on lokelma BID daily, off all potential causative meds -I am not sure what all workup has been done as I can't see all records, but suspect that if no clear etiology found, may need further renal workup. Discussed that I don't know exactly what this might entail, but suspect it would start with more straightforward tests (like urine electrolytes, etc) and progress to invasive tests only if absolutely needed. She has follow up with Dr. Glenna Fellows in 2 mos, will arrange  to see her back after that to determine what has ben found to date.  Paroxysmal atrial fibrillation CHA2DS2/VAS Stroke Risk Points= 3  -on diltiazem, flecainide -on rivaroxaban for long term anticoagulation -reviewed risk of arrhythmia on flecainide with ischemia, stress test normal  Hypertension: Goal <130/80 -on diltiazem alone given  electrolyte/renal issues as above   Hypercholesterolemia:  -tolerating atorvastatin 20 mg daily -most recent lipids, LFTs reviewed   Type II diabetes Morbid obesity -BMI 39 -on Ozempic, 0.5 mg dose. No weight loss or side effects. Recommended increasing to 1 mg dose and monitoring -was on Jardiance, this is on hold while her electrolyte/kidney issues are figured out -on atorvastatin -no aspirin as she is on rivaroxaban  CV risk counseling and prevention -recommend heart healthy/Mediterranean diet, with whole grains, fruits, vegetable, fish, lean meats, nuts, and olive oil. Limit salt. -recommend moderate walking, 3-5 times/week for 30-50 minutes each session. Aim for at least 150 minutes.week. Goal should be pace of 3 miles/hours, or walking 1.5 miles in 30 minutes -recommend avoidance of tobacco products. Avoid excess alcohol. -ASCVD risk score: The 10-year ASCVD risk score (Arnett DK, et al., 2019) is: 10.1%   Values used to calculate the score:     Age: 76 years     Sex: Female     Is Non-Hispanic African American: Yes     Diabetic: Yes     Tobacco smoker: No     Systolic Blood Pressure: 128 mmHg     Is BP treated: Yes     HDL Cholesterol: 59 mg/dL     Total Cholesterol: 148 mg/dL    Dispo: 2 mos  Total time of encounter: I spent 43 minutes dedicated to the care of this patient on the date of this encounter to include pre-visit review of records, face-to-face time with the patient discussing conditions above, and clinical documentation with the electronic health record. We specifically spent time today discussing fatigue, potential etiologies, review of her cardiac workup, discussion of CPX testing, review of her hyperkalemia workup and management.  Jodelle Red, MD, PhD, [facility]  [facility]  [place]  Heart & Vascular at [facility] at [facility] [address] [place], [place] [address] [phone]     Signed, Jodelle Red, MD   Jodelle Red, MD, PhD, [facility] [place]  [facility]    Heart & Vascular at [facility] at [facility] [address] [place], [place] [address] [phone]

## 2023-03-23 NOTE — Patient Instructions (Addendum)
Medication Instructions:  Increase the Ozempic dose to 1 mg weekly and monitor for side effects/weight loss.  Labwork: NONE   Testing/Procedures: Your physician has recommended that you have a cardiopulmonary stress test (CPX). CPX testing is a non-invasive measurement of heart and lung function. It replaces a traditional treadmill stress test. This type of test provides a tremendous amount of information that relates not only to your present condition but also for future outcomes. This test combines measurements of you ventilation, respiratory gas exchange in the lungs, electrocardiogram (EKG), blood pressure and physical response before, during, and following an exercise protocol.  Follow-Up: END OF FEBRUARY BEGINNING OF MARCH   Any Other Special Instructions Will Be Listed Below (If Applicable).  You are scheduled for a Cardiopulmonary Exercise (CPX) Test as [facility] on: Date:      Time:   Expect to be in the lab for 2 hours. Please plan to arrive 30 minutes prior to your appointment. You may be asked to reschedule your test if you arrive 20 minutes or more after your scheduled appointment time.  Main Campus address: [address] [place], [place] [address] You may arrive to the Main Entrance A or Entrance C (free valet parking is available at both). -Main Entrance A (on [address]) :proceed to admitting for check in -Entrance C (on [facility]): proceed to [employer] parking or under hospital deck parking using this code _________  Check In: [facility] waiting room (1st floor)  General Instructions for the day of the test (Please follow all instructions from your physician): Refrain from ingesting a heavy meal, alcohol, or caffeine or using tobacco products within 2 hours of the test (DO NOT FAST for mare than 8 hours). You may have all other non-alcoholic, non -caffeinated beverage,a light snack (crackers,a piece of fruit, carrot sticks, toast bagel,etc) up to  your appointment. Avoid significant exertion or exercise within 24 hours of your test. Be prepared to exercise and sweat. Your clothing should permit freedom of movement and include walking or running shoes. Women bring loose fitting short sleeved blouse.  This evaluation may be fatiguing and you may wish ti have someone accompany you to the assessment to drive you home afterward. Bring a list of your medications with you, including dosage and frequency you take the medications (  I.e.,once per day, twice per day, etc). Take all medications as prescribed, unless noted below or instructed to do so by your physician.  Please do not take the following medications prior to your CPX:  _________________________________________________  _________________________________________________  Brief description of the test: A brief lung test will be performed. This will involve you taking deep breaths and blowing hard and fast through your mouth. During these , a clip will be on your nose and you will be breathing through a breathing device.   For the exercise portion of the test you will be walking on a treadmill, or riding a stationary bike, to your maximal effor or until symptoms such as chest pain, shortness of breath, leg pain or dizziness limit your exercise. You will be breathing in and out of a breathing device through your mouth (a clip will be on your nose again). Your heart rate, ECG, blood pressure, oxygen saturations, breathing rate and depth, amount of oxygen you consume and amount of carbon dioxide you produce will be measured and monitored throughout the exercise test.  If you need to cancel or reschedule your appointment please call [phone] If you have  further questions please call your physician or Philip Aspen, MS, ACSM-RCEP at [phone]

## 2023-04-16 ENCOUNTER — Encounter (HOSPITAL_COMMUNITY): Payer: Self-pay | Admitting: Emergency Medicine

## 2023-04-16 ENCOUNTER — Other Ambulatory Visit: Payer: Self-pay

## 2023-04-16 ENCOUNTER — Emergency Department (HOSPITAL_COMMUNITY)
Admission: EM | Admit: 2023-04-16 | Discharge: 2023-04-16 | Disposition: A | Discharge status: Home / Self Care | Payer: Medicaid Other | Attending: Emergency Medicine | Admitting: Emergency Medicine

## 2023-04-16 DIAGNOSIS — E875 Hyperkalemia: Secondary | ICD-10-CM | POA: Diagnosis not present

## 2023-04-16 DIAGNOSIS — R7989 Other specified abnormal findings of blood chemistry: Secondary | ICD-10-CM | POA: Diagnosis present

## 2023-04-16 LAB — CBC
HCT: 32.8 % — ABNORMAL LOW (ref 36.0–46.0)
Hemoglobin: 10 g/dL — ABNORMAL LOW (ref 12.0–15.0)
MCH: 27.7 pg (ref 26.0–34.0)
MCHC: 30.5 g/dL (ref 30.0–36.0)
MCV: 90.9 fL (ref 80.0–100.0)
Platelets: 314 10*3/uL (ref 150–400)
RBC: 3.61 MIL/uL — ABNORMAL LOW (ref 3.87–5.11)
RDW: 13.5 % (ref 11.5–15.5)
WBC: 8.5 10*3/uL (ref 4.0–10.5)
nRBC: 0 % (ref 0.0–0.2)

## 2023-04-16 LAB — BASIC METABOLIC PANEL
Anion gap: 8 (ref 5–15)
BUN: 54 mg/dL — ABNORMAL HIGH (ref 6–20)
CO2: 19 mmol/L — ABNORMAL LOW (ref 22–32)
Calcium: 9.1 mg/dL (ref 8.9–10.3)
Chloride: 112 mmol/L — ABNORMAL HIGH (ref 98–111)
Creatinine, Ser: 2.57 mg/dL — ABNORMAL HIGH (ref 0.44–1.00)
GFR, Estimated: 21 mL/min — ABNORMAL LOW (ref >60)
Glucose, Bld: 146 mg/dL — ABNORMAL HIGH (ref 70–99)
Potassium: 5.8 mmol/L — ABNORMAL HIGH (ref 3.5–5.1)
Sodium: 139 mmol/L (ref 135–145)

## 2023-04-16 LAB — LAB REPORT - SCANNED: EGFR: 21

## 2023-04-16 LAB — POTASSIUM: Potassium: 4.9 mmol/L (ref 3.5–5.1)

## 2023-04-16 MED ORDER — LACTATED RINGERS IV BOLUS
1000.0000 mL | Freq: Once | INTRAVENOUS | Status: AC
  Administered 2023-04-16: 1000 mL via INTRAVENOUS

## 2023-04-16 MED ORDER — ALBUTEROL SULFATE (2.5 MG/3ML) 0.083% IN NEBU
10.0000 mg | INHALATION_SOLUTION | Freq: Once | RESPIRATORY_TRACT | Status: AC
  Administered 2023-04-16: 10 mg via RESPIRATORY_TRACT
  Filled 2023-04-16: qty 12

## 2023-04-16 NOTE — ED Notes (Signed)
 Patient says she has felt a "flutter" in her chest.

## 2023-04-16 NOTE — ED Triage Notes (Signed)
 Patient present with a potassium level of 6.7 from her MD office. She has been taking 2 packs of Lokelma a day for a month and a half.

## 2023-04-16 NOTE — ED Provider Notes (Signed)
 [place] EMERGENCY DEPARTMENT AT [facility] Provider Note   CSN: [id number] Arrival date & time: 04/16/23  1410     History  Chief Complaint  Patient presents with   Abnormal Lab    Dawn Braun is a 58 y.o. female.  HPI   58 year old female presents emergency department with concern for hyperkalemia.  Patient has history of hyperkalemia.  She is admitted in the past, they have done medication management and currently she is on daily Lokelma .  She was called from nephrology office and told that her potassium was above 6 and to come to the emergency department.  She states sometimes she is symptomatic from high potassium but currently she feels baseline.  She denies any acute symptoms/illness.  Is compliant with medications.  Home Medications Prior to Admission medications   Medication Sig Start Date End Date Taking? Authorizing Provider  atorvastatin  (LIPITOR) 40 MG tablet Take 1 tablet (40 mg total) by mouth daily. 11/03/22   Lucien Orren SAILOR, PA-C  diltiazem  (CARDIZEM  CD) 180 MG 24 hr capsule Take 1 capsule (180 mg total) by mouth daily. 12/28/22   Lucien Orren SAILOR, PA-C  diltiazem  (CARDIZEM ) 30 MG tablet Take 1 tablet (30 mg total) by mouth 2 (two) times daily. As needed for palpitations 12/28/22   Lucien Orren SAILOR, PA-C  flecainide  (TAMBOCOR ) 100 MG tablet Take 1 tablet (100 mg total) by mouth 2 (two) times daily. 11/03/22   Lucien Orren SAILOR, PA-C  glimepiride (AMARYL) 4 MG tablet Take 1 tablet by mouth daily.    [provider]  insulin  isophane & regular human KwikPen (NOVOLIN  70/30 KWIKPEN) (70-30) 100 UNIT/ML KwikPen Inject 14 Units into the skin in the morning and at bedtime.    [provider]  Insulin  Pen Needle 31G X 4 MM MISC 1 Container by Does not apply route 2 (two) times daily. 08/03/19   Danton Jon HERO, PA-C  Multiple Vitamins-Minerals (CENTRUM SILVER 50+WOMEN PO) Take 1 tablet by mouth 1 day or 1 dose.    [provider]   rivaroxaban  (XARELTO ) 20 MG TABS tablet Take 1 tablet (20 mg total) by mouth daily with supper. 11/03/22   Lucien Orren SAILOR, PA-C  Semaglutide , 1 MG/DOSE, (OZEMPIC , 1 MG/DOSE,) 4 MG/3ML SOPN AFTER YOU FINISH FIRST 8 WEEKS INJECT 1 MG WEEKLY 03/23/23   Lonni Slain, MD  sodium zirconium cyclosilicate  (LOKELMA ) 10 g PACK packet Take 10 g by mouth 2 (two) times daily. 11/11/22   Okey Vina GAILS, MD      Allergies    Amoxicillin    Review of Systems   Review of Systems  Constitutional:  Negative for fever.  Respiratory:  Negative for shortness of breath.   Cardiovascular:  Negative for chest pain.  Gastrointestinal:  Negative for abdominal pain, diarrhea and vomiting.  Skin:  Negative for rash.  Neurological:  Negative for headaches.    Physical Exam Updated Vital Signs BP (!) 160/71   Pulse 73   Temp 97.8 F (36.6 C) (Oral)   Resp 15   LMP 12/24/2015 (Exact Date)   SpO2 100%  Physical Exam Vitals and nursing note reviewed.  Constitutional:      Appearance: Normal appearance.  HENT:     Head: Normocephalic.     Mouth/Throat:     Mouth: Mucous membranes are moist.  Cardiovascular:     Rate and Rhythm: Normal rate.  Pulmonary:     Effort: Pulmonary effort is normal. No respiratory distress.  Abdominal:  Palpations: Abdomen is soft.     Tenderness: There is no abdominal tenderness.  Skin:    General: Skin is warm.  Neurological:     Mental Status: She is alert and oriented to person, place, and time. Mental status is at baseline.  Psychiatric:        Mood and Affect: Mood normal.     ED Results / Procedures / Treatments   Labs (all labs ordered are listed, but only abnormal results are displayed) Labs Reviewed  BASIC METABOLIC PANEL - Abnormal; Notable for the following components:      Result Value   Potassium 5.8 (*)    Chloride 112 (*)    CO2 19 (*)    Glucose, Bld 146 (*)    BUN 54 (*)    Creatinine, Ser 2.57 (*)    GFR, Estimated 21 (*)    All  other components within normal limits  CBC - Abnormal; Notable for the following components:   RBC 3.61 (*)    Hemoglobin 10.0 (*)    HCT 32.8 (*)    All other components within normal limits    EKG EKG Interpretation Date/Time:  Friday April 16 2023 14:53:17 EST Ventricular Rate:  73 PR Interval:  220 QRS Duration:  99 QT Interval:  399 QTC Calculation: 440 R Axis:   129  Text Interpretation: Sinus rhythm Prolonged PR interval Left posterior fascicular block Low voltage, precordial leads Confirmed by Bari Flank [phone]) on 04/16/2023 3:46:05 PM  Radiology No results found.  Procedures Procedures    Medications Ordered in ED Medications  lactated ringers  bolus 1,000 mL (1,000 mLs Intravenous New Bag/Given 04/16/23 1731)  albuterol  (PROVENTIL ) (2.5 MG/3ML) 0.083% nebulizer solution 10 mg (10 mg Nebulization Given 04/16/23 1658)    ED Course/ Medical Decision Making/ A&P                                 Medical Decision Making Amount and/or Complexity of Data Reviewed Labs: ordered.  Risk Prescription drug management.   58 year old female presents emergency department with reportedly a potassium level above 6.  She has history of hyperkalemia, admitted previously.  They did medication adjustment and she is currently on Lokelma .  On arrival vitals are stable.  Blood work reveals a potassium level of 5.8, which is around where she has been before.  It looks like at the time of discharge she was 4.8.  There is no evidence of potassium above 6.  EKG is normal sinus rhythm with no acute changes.  Question if the outpatient labs could have been hemolyzed.  Given this level, lack of symptoms and no EKG changes we will plan for treatment here, reevaluation and possible discharge.  After treatment potassium level has decreased to less than 5.  She continues to be asymptomatic.  Will plan for continued outpatient evaluation and treatment. Encouraged Lokelma  use.  Patient at  this time appears safe and stable for discharge and close outpatient follow up. Discharge plan and strict return to ED precautions discussed, patient verbalizes understanding and agreement.        Final Clinical Impression(s) / ED Diagnoses Final diagnoses:  None    Rx / DC Orders ED Discharge Orders     None         Bari Flank HERO, DO 04/16/23 2148

## 2023-04-16 NOTE — Discharge Instructions (Signed)
 You have been seen and discharged from the emergency department.  You were found to have hyperkalemia and dehydration.  You were treated with IV fluids and medicine.  Your potassium level normalized.  Continue taking your Lokelma .  Follow-up with your primary provider for further evaluation and further care. Take home medications as prescribed. If you have any worsening symptoms or further concerns for your health please return to an emergency department for further evaluation.

## 2023-04-16 NOTE — ED Provider Triage Note (Signed)
 Emergency Medicine Provider Triage Evaluation Note  Dawn Braun , a 58 y.o. female  was evaluated in triage.  Pt complains of abnormal labs. Report having blood work done by nephrologist and was told K+ is 6.7.  Pt sts she feels fine.  She has been taking lokelma  daily x 6 wks.  Denies cp, sob, weakness, n/v  Review of Systems  Positive: As above Negative: As above  Physical Exam  BP (!) 172/83 (BP Location: Left Arm)   Pulse 80   Temp 97.8 F (36.6 C) (Oral)   Resp 16   LMP 12/24/2015 (Exact Date)   SpO2 98%  Gen:   Awake, no distress   Resp:  Normal effort  MSK:   Moves extremities without difficulty  Other:    Medical Decision Making  Medically screening exam initiated at 3:03 PM.  Appropriate orders placed.  Dawn Braun was informed that the remainder of the evaluation will be completed by another provider, this initial triage assessment does not replace that evaluation, and the importance of remaining in the ED until their evaluation is complete.     Nivia Colon, PA-C 04/16/23 1505

## 2023-05-23 ENCOUNTER — Other Ambulatory Visit: Payer: Self-pay

## 2023-05-23 ENCOUNTER — Encounter (HOSPITAL_COMMUNITY): Payer: Self-pay | Admitting: *Deleted

## 2023-05-23 ENCOUNTER — Emergency Department (HOSPITAL_BASED_OUTPATIENT_CLINIC_OR_DEPARTMENT_OTHER): Payer: Medicaid Other

## 2023-05-23 ENCOUNTER — Emergency Department (HOSPITAL_COMMUNITY)
Admission: EM | Admit: 2023-05-23 | Discharge: 2023-05-23 | Disposition: A | Discharge status: Home / Self Care | Payer: Medicaid Other | Attending: Emergency Medicine | Admitting: Emergency Medicine

## 2023-05-23 ENCOUNTER — Emergency Department (HOSPITAL_COMMUNITY): Payer: Medicaid Other

## 2023-05-23 DIAGNOSIS — I129 Hypertensive chronic kidney disease with stage 1 through stage 4 chronic kidney disease, or unspecified chronic kidney disease: Secondary | ICD-10-CM | POA: Insufficient documentation

## 2023-05-23 DIAGNOSIS — E1122 Type 2 diabetes mellitus with diabetic chronic kidney disease: Secondary | ICD-10-CM | POA: Insufficient documentation

## 2023-05-23 DIAGNOSIS — Z7984 Long term (current) use of oral hypoglycemic drugs: Secondary | ICD-10-CM | POA: Diagnosis not present

## 2023-05-23 DIAGNOSIS — N189 Chronic kidney disease, unspecified: Secondary | ICD-10-CM | POA: Insufficient documentation

## 2023-05-23 DIAGNOSIS — M79605 Pain in left leg: Secondary | ICD-10-CM | POA: Diagnosis present

## 2023-05-23 DIAGNOSIS — Z7901 Long term (current) use of anticoagulants: Secondary | ICD-10-CM | POA: Insufficient documentation

## 2023-05-23 DIAGNOSIS — Z794 Long term (current) use of insulin: Secondary | ICD-10-CM | POA: Diagnosis not present

## 2023-05-23 DIAGNOSIS — T148XXA Other injury of unspecified body region, initial encounter: Secondary | ICD-10-CM

## 2023-05-23 LAB — CBC WITH DIFFERENTIAL/PLATELET
Abs Immature Granulocytes: 0.02 10*3/uL (ref 0.00–0.07)
Basophils Absolute: 0 10*3/uL (ref 0.0–0.1)
Basophils Relative: 0 %
Eosinophils Absolute: 0.3 10*3/uL (ref 0.0–0.5)
Eosinophils Relative: 4 %
HCT: 30.9 % — ABNORMAL LOW (ref 36.0–46.0)
Hemoglobin: 9.5 g/dL — ABNORMAL LOW (ref 12.0–15.0)
Immature Granulocytes: 0 %
Lymphocytes Relative: 26 %
Lymphs Abs: 2.1 10*3/uL (ref 0.7–4.0)
MCH: 27.3 pg (ref 26.0–34.0)
MCHC: 30.7 g/dL (ref 30.0–36.0)
MCV: 88.8 fL (ref 80.0–100.0)
Monocytes Absolute: 0.5 10*3/uL (ref 0.1–1.0)
Monocytes Relative: 6 %
Neutro Abs: 5.2 10*3/uL (ref 1.7–7.7)
Neutrophils Relative %: 64 %
Platelets: 310 10*3/uL (ref 150–400)
RBC: 3.48 MIL/uL — ABNORMAL LOW (ref 3.87–5.11)
RDW: 13.9 % (ref 11.5–15.5)
WBC: 8.2 10*3/uL (ref 4.0–10.5)
nRBC: 0 % (ref 0.0–0.2)

## 2023-05-23 LAB — COMPREHENSIVE METABOLIC PANEL
ALT: 20 U/L (ref 0–44)
AST: 21 U/L (ref 15–41)
Albumin: 3.8 g/dL (ref 3.5–5.0)
Alkaline Phosphatase: 99 U/L (ref 38–126)
Anion gap: 10 (ref 5–15)
BUN: 54 mg/dL — ABNORMAL HIGH (ref 6–20)
CO2: 20 mmol/L — ABNORMAL LOW (ref 22–32)
Calcium: 9.3 mg/dL (ref 8.9–10.3)
Chloride: 111 mmol/L (ref 98–111)
Creatinine, Ser: 2.44 mg/dL — ABNORMAL HIGH (ref 0.44–1.00)
GFR, Estimated: 22 mL/min — ABNORMAL LOW (ref >60)
Glucose, Bld: 136 mg/dL — ABNORMAL HIGH (ref 70–99)
Potassium: 4.4 mmol/L (ref 3.5–5.1)
Sodium: 141 mmol/L (ref 135–145)
Total Bilirubin: 0.6 mg/dL (ref 0.0–1.2)
Total Protein: 7 g/dL (ref 6.5–8.1)

## 2023-05-23 LAB — MAGNESIUM: Magnesium: 2.4 mg/dL (ref 1.7–2.4)

## 2023-05-23 LAB — C-REACTIVE PROTEIN: CRP: 0.6 mg/dL (ref <1.0)

## 2023-05-23 LAB — SEDIMENTATION RATE: Sed Rate: 44 mm/h — ABNORMAL HIGH (ref 0–22)

## 2023-05-23 MED ORDER — OXYCODONE-ACETAMINOPHEN 5-325 MG PO TABS
1.0000 | ORAL_TABLET | Freq: Once | ORAL | Status: AC
  Administered 2023-05-23: 1 via ORAL
  Filled 2023-05-23: qty 1

## 2023-05-23 MED ORDER — OXYCODONE-ACETAMINOPHEN 5-325 MG PO TABS: 1.0000 | ORAL_TABLET | Freq: Three times a day (TID) | ORAL | 0 refills | Status: AC | PRN

## 2023-05-23 NOTE — Progress Notes (Signed)
LLE venous duplex has been completed.   Results can be found under chart review under CV PROC. 05/23/2023 3:17 PM Donzell Coller RVT, RDMS

## 2023-05-23 NOTE — ED Notes (Signed)
Patient is calling for her ride   she is advised that we may have to move her to the lobby here in a few minutes
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
## 2023-05-23 NOTE — ED Triage Notes (Signed)
Here by POV from home for L shin knot, described as painful and warm. Denies other sx. Onset Friday. Concern for "clot". Takes xarelto for paroxysmal afib. HR RRR at this time. H/o DM. BS last checked 4d ago. "BS Runs 120-160". Rates pain 4/10. Alert, NAD, calm, interactive.

## 2023-05-23 NOTE — Discharge Instructions (Signed)
Utilize RICE therapy (rest, ice, compression, elevation).  Take Tylenol for pain.  A prescription for narcotic pain medication was sent to your pharmacy.  Take this only as needed.  Follow-up with your PCP.

## 2023-06-10 ENCOUNTER — Other Ambulatory Visit (HOSPITAL_BASED_OUTPATIENT_CLINIC_OR_DEPARTMENT_OTHER): Payer: Self-pay

## 2023-06-10 ENCOUNTER — Other Ambulatory Visit (HOSPITAL_COMMUNITY): Payer: Self-pay

## 2023-06-10 ENCOUNTER — Encounter (HOSPITAL_BASED_OUTPATIENT_CLINIC_OR_DEPARTMENT_OTHER): Payer: Self-pay | Admitting: Cardiology

## 2023-06-10 ENCOUNTER — Telehealth: Payer: Self-pay | Admitting: Pharmacy Technician

## 2023-06-10 ENCOUNTER — Telehealth (HOSPITAL_BASED_OUTPATIENT_CLINIC_OR_DEPARTMENT_OTHER): Payer: Self-pay

## 2023-06-10 ENCOUNTER — Ambulatory Visit (HOSPITAL_BASED_OUTPATIENT_CLINIC_OR_DEPARTMENT_OTHER): Payer: Medicaid Other | Admitting: Cardiology

## 2023-06-10 ENCOUNTER — Encounter (HOSPITAL_BASED_OUTPATIENT_CLINIC_OR_DEPARTMENT_OTHER): Payer: Self-pay

## 2023-06-10 VITALS — BP 142/78 | HR 76 | Ht 63.0 in | Wt 222.1 lb

## 2023-06-10 DIAGNOSIS — D6869 Other thrombophilia: Secondary | ICD-10-CM

## 2023-06-10 DIAGNOSIS — E875 Hyperkalemia: Secondary | ICD-10-CM | POA: Diagnosis not present

## 2023-06-10 DIAGNOSIS — E669 Obesity, unspecified: Secondary | ICD-10-CM | POA: Diagnosis not present

## 2023-06-10 DIAGNOSIS — E1169 Type 2 diabetes mellitus with other specified complication: Secondary | ICD-10-CM

## 2023-06-10 DIAGNOSIS — R6889 Other general symptoms and signs: Secondary | ICD-10-CM

## 2023-06-10 DIAGNOSIS — I10 Essential (primary) hypertension: Secondary | ICD-10-CM

## 2023-06-10 DIAGNOSIS — Z7901 Long term (current) use of anticoagulants: Secondary | ICD-10-CM

## 2023-06-10 DIAGNOSIS — Z7189 Other specified counseling: Secondary | ICD-10-CM

## 2023-06-10 DIAGNOSIS — E7800 Pure hypercholesterolemia, unspecified: Secondary | ICD-10-CM

## 2023-06-10 DIAGNOSIS — I480 Paroxysmal atrial fibrillation: Secondary | ICD-10-CM | POA: Diagnosis not present

## 2023-06-10 MED ORDER — MOUNJARO 5 MG/0.5ML ~~LOC~~ SOAJ
5.0000 mg | SUBCUTANEOUS | 0 refills | Status: DC
  Filled 2023-06-10 – 2023-07-13 (×3): qty 2, 28d supply, fill #0

## 2023-06-10 MED ORDER — MOUNJARO 2.5 MG/0.5ML ~~LOC~~ SOAJ
2.5000 mg | SUBCUTANEOUS | 0 refills | Status: DC
  Filled 2023-06-10: qty 2, 28d supply, fill #0

## 2023-06-10 MED ORDER — MOUNJARO 7.5 MG/0.5ML ~~LOC~~ SOAJ
7.5000 mg | SUBCUTANEOUS | 0 refills | Status: DC
  Filled 2023-06-10 – 2023-08-19 (×3): qty 2, 28d supply, fill #0

## 2023-06-10 NOTE — Telephone Encounter (Signed)
 Pharmacy Patient Advocate Encounter  Received notification from [facility] that Prior Authorization for Dawn Braun has been APPROVED from 06/10/23 to 06/09/24. Spoke to pharmacy to process.Copay is $4.00.    PA #/Case ID/Reference #: [id number]

## 2023-06-10 NOTE — Telephone Encounter (Signed)
 PA needed for [facility]. Request sent to PA team.

## 2023-06-10 NOTE — Telephone Encounter (Signed)
 Called and spoke to patient. PA approved and pt is aware of this and copay of $4. She verbalized understanding.

## 2023-06-10 NOTE — Patient Instructions (Signed)
 Medication Instructions:  Changing Ozempic to [facility] *If you need a refill on your cardiac medications before your next appointment, please call your pharmacy*   Lab Work: None If you have labs (blood work) drawn today and your tests are completely normal, you will receive your results only by: MyChart Message (if you have MyChart) OR A paper copy in the mail If you have any lab test that is abnormal or we need to change your treatment, we will call you to review the results.   Testing/Procedures: I have sent a message to the team to see why your cardiopulmonary exercise test hasn't been scheduled. If you haven't heard from them in the next week let me know.    Follow-Up: At [facility], you and your health needs are our priority.  As part of our continuing mission to provide you with exceptional heart care, we have created designated Provider Care Teams.  These Care Teams include your primary Cardiologist (physician) and Advanced Practice Providers (APPs -  Physician Assistants and Nurse Practitioners) who all work together to provide you with the care you need, when you need it.  We recommend signing up for the patient portal called "MyChart".  Sign up information is provided on this After Visit Summary.  MyChart is used to connect with patients for Virtual Visits (Telemedicine).  Patients are able to view lab/test results, encounter notes, upcoming appointments, etc.  Non-urgent messages can be sent to your provider as well.   To learn more about what you can do with MyChart, go to [email or web address].    Your next appointment:   3-4 month(s)  Provider:   Jodelle Red, MD    Other Instructions Please check your BP at home and let me know if it is consistent more than 150/90

## 2023-06-10 NOTE — Progress Notes (Signed)
 Cardiology Office Note:  .   Date:  06/10/2023  ID:  Dawn Braun, DOB 1966-01-28, MRN [id number] PCP: Jackie Plum, MD  [facility] Providers Cardiologist:  Jodelle Red, MD {  History of Present Illness: Dawn Braun   Dawn Braun is a 58 y.o. female with a hx of labile hypertension, prior SBO s/p surgery, uncontrolled type 2 diabetes with history of DKA, paroxysmal atrial fibrillation followed by afib clinic, asthma, stage 3 CKD with history of AKI, obesity who is seen for follow up today. I initially saw her 05/01/19 as a new consult at the request of Dawn Skeans, MD for the evaluation and management of paroxysmal atrial fibrillation.   Pertinent CV history: Admitted 09/2022 for hyperkalemia, ARB stopped. Presented with hypertensive urgency. Has chronic issues with LE edema, lightheadedness, fatigue. Monitor 10/2022 unremarkable. Echo 10/2022 EF 70-75%, mild LVH, G1DD, normal RV, RAP 3, no significant valve disease. Stress test 10/2022 without ischemia.  Today: Had ER visit 04/16/23 for hyperkalemia. Has been on lokelma BID for months and now florinef twice a day since then. She is on no medications that would raise potassium. Dr. Glenna Fellows is thoroughly investigating etiology. We did discussed that the florinef is likely why her blood pressure is higher than usual.  Had another ER visit 05/23/23 for leg pain. DVT u/s negative.   Doesn't feel that Ozempic is working--is taking the 0.5 mg dose, despite [place] ordering the 1 mg dose, her pharmacy has not filled. No significant side effects. Given difficulty  ROS: Denies chest pain, shortness of breath at rest or with normal exertion. No PND, orthopnea, LE edema or unexpected weight gain. No syncope or palpitations. ROS otherwise negative except as noted.   Studies Reviewed: Dawn Braun    EKG:       Physical Exam:   VS:  BP (!) 142/78   Pulse 76   Ht 5\' 3"  (1.6 m)   Wt 222 lb 1.6 oz (100.7 kg)   LMP 12/24/2015 (Exact Date)    SpO2 97%   BMI 39.34 kg/m    Wt Readings from Last 3 Encounters:  06/10/23 222 lb 1.6 oz (100.7 kg)  05/23/23 224 lb (101.6 kg)  03/23/23 224 lb (101.6 kg)    GEN: Well nourished, well developed in no acute distress HEENT: Normal, moist mucous membranes NECK: No JVD CARDIAC: regular rhythm, normal S1 and S2, no rubs or gallops. No murmur. VASCULAR: Radial and DP pulses 2+ bilaterally. No carotid bruits RESPIRATORY:  Clear to auscultation without rales, wheezing or rhonchi  ABDOMEN: Soft, non-tender, non-distended MUSCULOSKELETAL:  Ambulates independently SKIN: Warm and dry, no edema NEUROLOGIC:  Alert and oriented x 3. No focal neuro deficits noted. PSYCHIATRIC:  Normal affect    ASSESSMENT AND PLAN: .    Exercise intolerance Dyspnea on exertion Fatigue -workup from a cardiac standpoint has been unremarkable. Reviewed at length today.  -ordered CPX in December but has not been called to schedule. Sent a message to follow up on this. UPDATE: no current CPX tech, in the process of hiring.   Hyperkalemia Hypertension: Goal <130/80 -on diltiazem alone given electrolyte/renal issues as above -following with Dr. Glenna Fellows at [facility] closely -still on lokelma BID daily, off all potential causative meds -now also on florinef, discussed that this may increase her BP. Discussed options, including adding hydralazine (difficult as this is a TID med), vs changing diltiazem to beta blocker and adding amlodipine (she did not feel well on beta blocker in the past) -she  will let me know if BP continues to rise at home  Paroxysmal atrial fibrillation CHA2DS2/VAS Stroke Risk Points= 3  -on diltiazem, flecainide -on rivaroxaban for long term anticoagulation -stress test normal  Hypercholesterolemia:  -tolerating atorvastatin 20 mg daily -most recent lipids, LFTs reviewed   Type II diabetes Morbid obesity -BMI 39 -on Ozempic, 0.5 mg dose. No weight loss or side effects. Has had  issues with pharmacy filling this. Will change to [facility], sent to [employer] [facility]. PA started -was on Jardiance, this is on hold while her electrolyte/kidney issues are figured out -on atorvastatin -no aspirin as she is on rivaroxaban  CV risk counseling and prevention -recommend heart healthy/Mediterranean diet, with whole grains, fruits, vegetable, fish, lean meats, nuts, and olive oil. Limit salt. -recommend moderate walking, 3-5 times/week for 30-50 minutes each session. Aim for at least 150 minutes.week. Goal should be pace of 3 miles/hours, or walking 1.5 miles in 30 minutes -recommend avoidance of tobacco products. Avoid excess alcohol. -ASCVD risk score: The 10-year ASCVD risk score (Arnett DK, et al., 2019) is: 13.7%   Values used to calculate the score:     Age: 4 years     Sex: Female     Is Non-Hispanic African American: Yes     Diabetic: Yes     Tobacco smoker: No     Systolic Blood Pressure: 142 mmHg     Is BP treated: Yes     HDL Cholesterol: 59 mg/dL     Total Cholesterol: 148 mg/dL    Dispo: 3-4 months  Total time of encounter: I spent 41 minutes dedicated to the care of this patient on the date of this encounter to include pre-visit review of records, face-to-face time with the patient discussing conditions above, and clinical documentation with the electronic health record. We specifically spent time today discussing Mounjaro, reviewing symptoms, reviewing medications. Also time spent communicating with pharmacy team re: prior auth and hospital team re: CPX testing.   Signed, Jodelle Red, MD   Jodelle Red, MD, PhD, [facility]  [facility]  [place]  [facility] at [facility] at [facility] [address] [place], [place] [address] [phone]

## 2023-06-10 NOTE — Telephone Encounter (Signed)
 Pharmacy Patient Advocate Encounter   Received notification from CoverMyMeds that prior authorization for mounjaro is required/requested.   Insurance verification completed.   The patient is insured through [facility] .   Per test claim: PA required; PA submitted to above mentioned insurance via CoverMyMeds Key/confirmation #/EOC [id number] Status is pending

## 2023-07-05 ENCOUNTER — Other Ambulatory Visit (HOSPITAL_BASED_OUTPATIENT_CLINIC_OR_DEPARTMENT_OTHER): Payer: Self-pay

## 2023-07-13 ENCOUNTER — Other Ambulatory Visit (HOSPITAL_BASED_OUTPATIENT_CLINIC_OR_DEPARTMENT_OTHER): Payer: Self-pay

## 2023-08-12 ENCOUNTER — Ambulatory Visit (INDEPENDENT_AMBULATORY_CARE_PROVIDER_SITE_OTHER)

## 2023-08-12 ENCOUNTER — Encounter (HOSPITAL_COMMUNITY): Payer: Self-pay

## 2023-08-12 ENCOUNTER — Ambulatory Visit (HOSPITAL_COMMUNITY)
Admission: EM | Admit: 2023-08-12 | Discharge: 2023-08-12 | Disposition: A | Discharge status: Home / Self Care | Attending: Family Medicine | Admitting: Family Medicine

## 2023-08-12 DIAGNOSIS — J069 Acute upper respiratory infection, unspecified: Secondary | ICD-10-CM | POA: Diagnosis not present

## 2023-08-12 DIAGNOSIS — R051 Acute cough: Secondary | ICD-10-CM | POA: Diagnosis not present

## 2023-08-12 LAB — POC SARS CORONAVIRUS 2 AG -  ED: SARS Coronavirus 2 Ag: NEGATIVE

## 2023-08-12 MED ORDER — ALBUTEROL SULFATE HFA 108 (90 BASE) MCG/ACT IN AERS: 2.0000 | INHALATION_SPRAY | RESPIRATORY_TRACT | 0 refills | Status: AC | PRN

## 2023-08-12 MED ORDER — BENZONATATE 200 MG PO CAPS: 200.0000 mg | ORAL_CAPSULE | Freq: Three times a day (TID) | ORAL | 0 refills | Status: DC | PRN

## 2023-08-12 NOTE — ED Triage Notes (Signed)
 Pt states cough and congestion for the past 2 days. States she has been taking OTC cold medicine with no relief.

## 2023-08-12 NOTE — ED Provider Notes (Signed)
 [facility]    CSN: [id number] Arrival date & time: 08/12/23  7619      History   Chief Complaint Chief Complaint  Patient presents with   Cough    HPI Dawn Braun is a 58 y.o. female.    Cough Associated symptoms: rhinorrhea and shortness of breath   Associated symptoms: no fever    Patient is here for cough, chest and sinus congestion and sore throat x 2 days.  Brown sputum.  No known fevers.  She does have wheezing/sob.  She does have asthma, but has not had an "attack" in many years.  Using coridiin without help.  Her husband was sick last week, covid, flu, strep all negative.        Past Medical History:  Diagnosis Date   Asthma    Chronic kidney disease (CKD) stage G3a/A3, moderately decreased glomerular filtration rate (GFR) between 45-59 mL/min/1.73 square meter and albuminuria creatinine ratio greater than 300 mg/g (HCC)    Complication of anesthesia    "takes me awhile to wakeup" (05/17/2017)   Hypertension    Intractable nausea and vomiting    Microalbuminuria due to type 2 diabetes mellitus (HCC)    Paroxysmal atrial fibrillation (HCC)    Small bowel obstruction (HCC) 03/26/2019   Type II diabetes mellitus (HCC)     Patient Active Problem List   Diagnosis Date Noted   Hyperkalemia 09/26/2022   Primary osteoarthritis of left knee 05/23/2021   Morbid obesity (HCC) 05/23/2021   Obesity, Class III, BMI 40-49.9 (morbid obesity) 05/23/2021   Sleep disorder 11/01/2020   Generalized edema 11/01/2020   Secondary hypercoagulable state (HCC) 04/01/2020   Hypercholesterolemia 04/01/2020   Essential hypertension 04/01/2020   Chronic kidney disease (CKD) stage G3a/A3, moderately decreased glomerular filtration rate (GFR) between 45-59 mL/min/1.73 square meter and albuminuria creatinine ratio greater than 300 mg/g (HCC)    Women's annual routine gynecological examination 03/04/2020   CKD (chronic kidney disease) stage 3, GFR 30-59 ml/min  (HCC) 03/28/2019   Obesity (BMI 30-39.9) 03/28/2019   Chronic anticoagulation 03/28/2019   Closed loop SBO (small bowel obstruction) s/p exlap /lysis of adhesions 03/26/2019 03/25/2019   Acute renal failure superimposed on stage 3 chronic kidney disease (HCC) 09/19/2018   DKA (diabetic ketoacidosis) (HCC) 09/18/2018   Leukocytosis 05/17/2017   Hypokalemia 01/02/2017   SIRS (systemic inflammatory response syndrome) (HCC) 12/31/2016   Hemoperitoneum 12/31/2016   Intractable nausea and vomiting 12/31/2016   AKI (acute kidney injury) (HCC) 12/31/2016   Paroxysmal atrial fibrillation (HCC)    Asthma    Type 2 diabetes mellitus with obesity (HCC) 12/23/2016   Menopausal symptom 05/01/2016    Past Surgical History:  Procedure Laterality Date   CESAREAN SECTION  2003   CHOLECYSTECTOMY N/A 12/23/2016   Procedure: LAPAROSCOPIC CHOLECYSTECTOMY;  Surgeon: Derral Flick, MD;  Location: [facility];  Service: General;  Laterality: N/A;   COLONOSCOPY  05/02/2020   CG, MD   EYE SURGERY Right ~ 2016   "bleeding issues; lasered; cauterized leaks"   KNEE ARTHROSCOPY Right X 2   LAPAROTOMY N/A 03/26/2019   Procedure: EXPLORATORY LAPAROTOMY;  Surgeon: Oralee Billow, MD;  Location: [facility];  Service: General;  Laterality: N/A;   LYSIS OF ADHESION N/A 03/26/2019   Procedure: LYSIS OF ADHESION;  Surgeon: Oralee Billow, MD;  Location: [facility];  Service: General;  Laterality: N/A;   SPLIT NIGHT STUDY  08/06/2015   TUBAL LIGATION  2003   UPPER GASTROINTESTINAL ENDOSCOPY  05/02/2020  CG, MD    OB History     Gravida      Para      Term      Preterm      AB      Living  2      SAB      IAB      Ectopic      Multiple      Live Births               Home Medications    Prior to Admission medications   Medication Sig Start Date End Date Taking? Authorizing Provider  atorvastatin  (LIPITOR) 40 MG tablet Take 1 tablet (40 mg total) by mouth daily. 11/03/22   Von Grumbling, PA-C   diltiazem  (CARDIZEM  CD) 180 MG 24 hr capsule Take 1 capsule (180 mg total) by mouth daily. 12/28/22   Von Grumbling, PA-C  diltiazem  (CARDIZEM ) 30 MG tablet Take 1 tablet (30 mg total) by mouth 2 (two) times daily. As needed for palpitations 12/28/22   Von Grumbling, PA-C  flecainide  (TAMBOCOR ) 100 MG tablet Take 1 tablet (100 mg total) by mouth 2 (two) times daily. 11/03/22   Von Grumbling, PA-C  fludrocortisone (FLORINEF) 0.1 MG tablet Take 100 mcg by mouth 2 (two) times daily. 05/13/23   [provider]  glimepiride (AMARYL) 4 MG tablet Take 1 tablet by mouth daily.    [provider]  insulin  isophane & regular human KwikPen (NOVOLIN  70/30 KWIKPEN) (70-30) 100 UNIT/ML KwikPen Inject 14 Units into the skin in the morning and at bedtime.    [provider]  Insulin  Pen Needle 31G X 4 MM MISC 1 Container by Does not apply route 2 (two) times daily. 08/03/19   Hassie Lint, PA-C  Multiple Vitamins-Minerals (CENTRUM SILVER 50+WOMEN PO) Take 1 tablet by mouth 1 day or 1 dose.    [provider]  rivaroxaban  (XARELTO ) 20 MG TABS tablet Take 1 tablet (20 mg total) by mouth daily with supper. 11/03/22   Von Grumbling, PA-C  sodium zirconium cyclosilicate  (LOKELMA ) 10 g PACK packet Take 10 g by mouth 2 (two) times daily. 11/11/22   Elmyra Haggard, MD  tirzepatide  (MOUNJARO ) 2.5 MG/0.5ML Pen Inject 2.5 mg into the skin once a week. 06/10/23   Sheryle Donning, MD  tirzepatide  (MOUNJARO ) 5 MG/0.5ML Pen Inject 5 mg into the skin once a week. 06/10/23   Sheryle Donning, MD  tirzepatide  (MOUNJARO ) 7.5 MG/0.5ML Pen Inject 7.5 mg into the skin once a week. 06/10/23   Sheryle Donning, MD    Family History Family History  Problem Relation Age of Onset   Epilepsy Father    Diabetes Mother    Kidney disease Mother    Hypertension Mother    Kidney disease Brother    Diabetic kidney disease Brother    Diabetes Brother    Colon cancer Neg Hx    Pancreatic  cancer Neg Hx    Esophageal cancer Neg Hx    Rectal cancer Neg Hx    Stomach cancer Neg Hx    Liver cancer Neg Hx     Social History Social History   Tobacco Use   Smoking status: Never   Smokeless tobacco: Never  Vaping Use   Vaping status: Never Used  Substance Use Topics   Alcohol use: Yes    Alcohol/week: 1.0 standard drink of alcohol    Types: 1 Glasses of wine per week  Comment: 05/17/2017 "glass of wine q other weekend"   Drug use: No     Allergies   Amoxicillin   Review of Systems Review of Systems  Constitutional:  Positive for fatigue. Negative for fever.  HENT:  Positive for congestion and rhinorrhea.   Respiratory:  Positive for cough and shortness of breath.   Cardiovascular: Negative.   Gastrointestinal: Negative.   Musculoskeletal: Negative.   Psychiatric/Behavioral: Negative.       Physical Exam Triage Vital Signs ED Triage Vitals  Encounter Vitals Group     BP 08/12/23 0819 (!) 140/86     Systolic BP Percentile --      Diastolic BP Percentile --      Pulse Rate 08/12/23 0819 97     Resp 08/12/23 0819 16     Temp 08/12/23 0819 98.4 F (36.9 C)     Temp Source 08/12/23 0819 Oral     SpO2 08/12/23 0819 98 %     Weight --      Height --      Head Circumference --      Peak Flow --      Pain Score 08/12/23 0817 0     Pain Loc --      Pain Education --      Exclude from Growth Chart --    No data found.  Updated Vital Signs BP (!) 140/86 (BP Location: Left Arm)   Pulse 97   Temp 98.4 F (36.9 C) (Oral)   Resp 16   LMP 12/24/2015 (Exact Date)   SpO2 98%   Visual Acuity Right Eye Distance:   Left Eye Distance:   Bilateral Distance:    Right Eye Near:   Left Eye Near:    Bilateral Near:     Physical Exam Constitutional:      General: She is not in acute distress.    Appearance: Normal appearance. She is normal weight. She is not ill-appearing or toxic-appearing.  HENT:     Nose: Congestion present. No rhinorrhea.      Right Sinus: No maxillary sinus tenderness or frontal sinus tenderness.     Left Sinus: No maxillary sinus tenderness or frontal sinus tenderness.     Mouth/Throat:     Mouth: Mucous membranes are moist.     Pharynx: Posterior oropharyngeal erythema present. No pharyngeal swelling or oropharyngeal exudate.     Tonsils: No tonsillar exudate.  Cardiovascular:     Rate and Rhythm: Normal rate and regular rhythm.  Pulmonary:     Effort: Pulmonary effort is normal.     Breath sounds: Normal breath sounds. No wheezing.  Musculoskeletal:     Cervical back: Normal range of motion and neck supple. Tenderness present.  Lymphadenopathy:     Cervical: No cervical adenopathy.  Skin:    General: Skin is warm.  Neurological:     General: No focal deficit present.     Mental Status: She is alert.  Psychiatric:        Mood and Affect: Mood normal.      UC Treatments / Results  Labs (all labs ordered are listed, but only abnormal results are displayed) Labs Reviewed - No data to display  EKG   Radiology No results found.  Procedures Procedures (including critical care time)  Medications Ordered in UC Medications - No data to display  Initial Impression / Assessment and Plan / UC Course  I have reviewed the triage vital signs and the nursing notes.  Pertinent  labs & imaging results that were available during my care of the patient were reviewed by me and considered in my medical decision making (see chart for details).   Final Clinical Impressions(s) / UC Diagnoses   Final diagnoses:  Acute cough  Viral URI with cough     Discharge Instructions      You were seen today for upper respiratory symptoms.  Your covid swab was negative.   Your chest xray appears normal.  If the radiologist reads this differently we will call to notify you.  Your symptoms are likely viral.  I have sent out a mediation to help with cough, and an inhaler for any sensation of shortness of breath you  may have.  I recommend salt water  gargles, tylenol  and motrin for sore throat and body aches.  Please get plenty of rest and increase fluids.  Follow up if not improving as expected.   ED Prescriptions     Medication Sig Dispense Auth. Provider   benzonatate  (TESSALON ) 200 MG capsule Take 1 capsule (200 mg total) by mouth 3 (three) times daily as needed for cough. 21 capsule Saanvika Vazques, MD   albuterol  (VENTOLIN  HFA) 108 (90 Base) MCG/ACT inhaler Inhale 2 puffs into the lungs every 4 (four) hours as needed for wheezing or shortness of breath. 1 each Lesle Ras, MD      PDMP not reviewed this encounter.   Lesle Ras, MD 08/12/23 [phone]

## 2023-08-12 NOTE — Discharge Instructions (Signed)
 You were seen today for upper respiratory symptoms.  Your covid swab was negative.   Your chest xray appears normal.  If the radiologist reads this differently we will call to notify you.  Your symptoms are likely viral.  I have sent out a mediation to help with cough, and an inhaler for any sensation of shortness of breath you may have.  I recommend salt water  gargles, tylenol  and motrin for sore throat and body aches.  Please get plenty of rest and increase fluids.  Follow up if not improving as expected.

## 2023-08-19 ENCOUNTER — Other Ambulatory Visit (HOSPITAL_COMMUNITY): Payer: Self-pay

## 2023-08-19 ENCOUNTER — Other Ambulatory Visit (HOSPITAL_BASED_OUTPATIENT_CLINIC_OR_DEPARTMENT_OTHER): Payer: Self-pay

## 2023-08-23 ENCOUNTER — Other Ambulatory Visit (HOSPITAL_BASED_OUTPATIENT_CLINIC_OR_DEPARTMENT_OTHER): Payer: Self-pay

## 2023-09-10 ENCOUNTER — Telehealth (HOSPITAL_BASED_OUTPATIENT_CLINIC_OR_DEPARTMENT_OTHER): Payer: Self-pay | Admitting: Cardiology

## 2023-09-10 MED ORDER — MOUNJARO 15 MG/0.5ML ~~LOC~~ SOAJ: 15.0000 mg | SUBCUTANEOUS | 0 refills | Status: DC

## 2023-09-10 MED ORDER — MOUNJARO 12.5 MG/0.5ML ~~LOC~~ SOAJ: 12.5000 mg | SUBCUTANEOUS | 0 refills | Status: DC

## 2023-09-10 MED ORDER — MOUNJARO 10 MG/0.5ML ~~LOC~~ SOAJ: 10.0000 mg | SUBCUTANEOUS | 0 refills | Status: DC

## 2023-09-10 NOTE — Telephone Encounter (Signed)
 Pt advised she would be out on Monjaro before appt in July, please refill or call pt with further insrtuction.

## 2023-09-10 NOTE — Telephone Encounter (Signed)
 Called and spoke to pt. Pt currently finished 7.5mg  Mounjaro . Will send next doses into pharmacy. Pt verbalized understanding.

## 2023-09-13 ENCOUNTER — Telehealth: Payer: Self-pay | Admitting: Cardiology

## 2023-09-13 NOTE — Telephone Encounter (Signed)
 Spoke with patient regarding her breathing Has been having shortness of breath and fatigue, just over all not feeling well This is very similar to when she had her potassium elevated and ended up in the hospital Has been taking Lokelma  as directed Schedule appointment for her to see Jerilee Montane NP 6/11 Patient aware of date, time, and location  Advised to go to ED if worse

## 2023-09-13 NOTE — Telephone Encounter (Signed)
 Left message to call back

## 2023-09-13 NOTE — Telephone Encounter (Signed)
 Pt c/o Shortness Of Breath: STAT if SOB developed within the last 24 hours or pt is noticeably SOB on the phone  1. Are you currently SOB (can you hear that pt is SOB on the phone)? No  2. How long have you been experiencing SOB? 1.5 week  3. Are you SOB when sitting or when up moving around? Up moving around  4. Are you currently experiencing any other symptoms? All over Muscle cramps, fatigue

## 2023-09-15 ENCOUNTER — Encounter: Payer: Self-pay | Admitting: Nurse Practitioner

## 2023-09-15 ENCOUNTER — Ambulatory Visit: Attending: Nurse Practitioner | Admitting: Nurse Practitioner

## 2023-09-15 VITALS — BP 130/72 | HR 70 | Ht 62.5 in | Wt 211.0 lb

## 2023-09-15 DIAGNOSIS — N183 Chronic kidney disease, stage 3 unspecified: Secondary | ICD-10-CM | POA: Diagnosis present

## 2023-09-15 DIAGNOSIS — I10 Essential (primary) hypertension: Secondary | ICD-10-CM | POA: Insufficient documentation

## 2023-09-15 DIAGNOSIS — E7800 Pure hypercholesterolemia, unspecified: Secondary | ICD-10-CM | POA: Diagnosis present

## 2023-09-15 DIAGNOSIS — E669 Obesity, unspecified: Secondary | ICD-10-CM | POA: Insufficient documentation

## 2023-09-15 DIAGNOSIS — I480 Paroxysmal atrial fibrillation: Secondary | ICD-10-CM | POA: Diagnosis not present

## 2023-09-15 DIAGNOSIS — R0609 Other forms of dyspnea: Secondary | ICD-10-CM | POA: Diagnosis present

## 2023-09-15 NOTE — Patient Instructions (Signed)
 Medication Instructions:  Your physician recommends that you continue on your current medications as directed. Please refer to the Current Medication list given to you today.  *If you need a refill on your cardiac medications before your next appointment, please call your pharmacy*  Lab Work: CBC, A1C, CMET, TSH today Fasting Lipid panel at your convenience   Testing/Procedures: Please schedule Cardiopulmonary exercise test previously ordered  Follow-Up: At [facility], you and your health needs are our priority.  As part of our continuing mission to provide you with exceptional heart care, our providers are all part of one team.  This team includes your primary Cardiologist (physician) and Advanced Practice Providers or APPs (Physician Assistants and Nurse Practitioners) who all work together to provide you with the care you need, when you need it.  Your next appointment:    Keep July 2025 appointment   Provider:   Sheryle Donning, MD    We recommend signing up for the patient portal called MyChart.  Sign up information is provided on this After Visit Summary.  MyChart is used to connect with patients for Virtual Visits (Telemedicine).  Patients are able to view lab/test results, encounter notes, upcoming appointments, etc.  Non-urgent messages can be sent to your provider as well.   To learn more about what you can do with MyChart, go to [email or web address].

## 2023-09-15 NOTE — Progress Notes (Signed)
 Office Visit    Patient Name: Dawn Braun Date of Encounter: 09/15/2023  Primary Care Provider:  Tretha Fu, MD Primary Cardiologist:  Sheryle Donning, MD  Chief Complaint    58 year old female with a history of paroxysmal atrial fibrillation, dyspnea on exertion, hypertension, type 2 diabetes, CKD stage III with history of AKI, SBO s/p surgery, asthma, and obesity who presents for follow-up related to atrial fibrillation and hypertension.  Past Medical History    Past Medical History:  Diagnosis Date   Asthma    Chronic kidney disease (CKD) stage G3a/A3, moderately decreased glomerular filtration rate (GFR) between 45-59 mL/min/1.73 square meter and albuminuria creatinine ratio greater than 300 mg/g (HCC)    Complication of anesthesia    takes me awhile to wakeup (05/17/2017)   Hypertension    Intractable nausea and vomiting    Microalbuminuria due to type 2 diabetes mellitus (HCC)    Paroxysmal atrial fibrillation (HCC)    Small bowel obstruction (HCC) 03/26/2019   Type II diabetes mellitus (HCC)    Past Surgical History:  Procedure Laterality Date   CESAREAN SECTION  2003   CHOLECYSTECTOMY N/A 12/23/2016   Procedure: LAPAROSCOPIC CHOLECYSTECTOMY;  Surgeon: Derral Flick, MD;  Location: [facility];  Service: General;  Laterality: N/A;   COLONOSCOPY  05/02/2020   CG, MD   EYE SURGERY Right ~ 2016   bleeding issues; lasered; cauterized leaks   KNEE ARTHROSCOPY Right X 2   LAPAROTOMY N/A 03/26/2019   Procedure: EXPLORATORY LAPAROTOMY;  Surgeon: Oralee Billow, MD;  Location: [facility];  Service: General;  Laterality: N/A;   LYSIS OF ADHESION N/A 03/26/2019   Procedure: LYSIS OF ADHESION;  Surgeon: Oralee Billow, MD;  Location: [facility];  Service: General;  Laterality: N/A;   SPLIT NIGHT STUDY  08/06/2015   TUBAL LIGATION  2003   UPPER GASTROINTESTINAL ENDOSCOPY  05/02/2020   CG, MD    Allergies  Allergies  Allergen Reactions   Amoxicillin      Per pt Rash     Labs/Other Studies Reviewed    The following studies were reviewed today:  Cardiac Studies & Procedures   ______________________________________________________________________________________________   STRESS TESTS  MYOCARDIAL PERFUSION IMAGING 11/04/2022  Narrative   The study is normal. The study is low risk.   No ST deviation was noted.   LV perfusion is normal. There is no evidence of ischemia. There is no evidence of infarction.   Left ventricular function is normal. Nuclear stress EF: 62%. The left ventricular ejection fraction is normal (55-65%). End diastolic cavity size is normal. End systolic cavity size is normal.   ECHOCARDIOGRAM  ECHOCARDIOGRAM COMPLETE 10/12/2022  Narrative ECHOCARDIOGRAM REPORT    Patient Name:   [name] Date of Exam: 10/12/2022 Medical Rec #:  [id number]           Height:       62.0 in Accession #:    [id number]          Weight:       236.6 lb Date of Birth:  1966/01/17           BSA:          2.053 m Patient Age:    57 years            BP:           130/90 mmHg Patient Gender: F  HR:           69 bpm. Exam Location:  [address]  Procedure: 2D Echo, Cardiac Doppler and Color Doppler  Indications:    R00.2 Palpitations  History:        Patient has prior history of Echocardiogram examinations, most recent 12/16/2017. Palpitations, Arrythmias:Atrial Fibrillation; Risk Factors:Hypertension and Diabetes.  Sonographer:    Lula Sale RDCS Referring Phys: [id number] TESSA N CONTE  IMPRESSIONS   1. Moderate septal hypertrophy with otherwise mild concentric LVH. Left ventricular ejection fraction, by estimation, is 70 to 75%. The left ventricle has hyperdynamic function. The left ventricle has no regional wall motion abnormalities. There is moderate asymmetric left ventricular hypertrophy. Left ventricular diastolic parameters are consistent with Grade I diastolic dysfunction (impaired relaxation).  Elevated left ventricular end-diastolic pressure. 2. Right ventricular systolic function is normal. The right ventricular size is normal. There is normal pulmonary artery systolic pressure. 3. Left atrial size was mildly dilated. 4. The mitral valve is normal in structure. Trivial mitral valve regurgitation. No evidence of mitral stenosis. 5. The aortic valve is tricuspid. Aortic valve regurgitation is not visualized. No aortic stenosis is present. 6. The inferior vena cava is normal in size with greater than 50% respiratory variability, suggesting right atrial pressure of 3 mmHg.  FINDINGS Left Ventricle: Moderate septal hypertrophy with otherwise mild concentric LVH. Left ventricular ejection fraction, by estimation, is 70 to 75%. The left ventricle has hyperdynamic function. The left ventricle has no regional wall motion abnormalities. The left ventricular internal cavity size was normal in size. There is moderate asymmetric left ventricular hypertrophy. Left ventricular diastolic parameters are consistent with Grade I diastolic dysfunction (impaired relaxation). Elevated left ventricular end-diastolic pressure.  Right Ventricle: The right ventricular size is normal. No increase in right ventricular wall thickness. Right ventricular systolic function is normal. There is normal pulmonary artery systolic pressure. The tricuspid regurgitant velocity is 2.49 m/s, and with an assumed right atrial pressure of 3 mmHg, the estimated right ventricular systolic pressure is 27.8 mmHg.  Left Atrium: Left atrial size was mildly dilated.  Right Atrium: Right atrial size was normal in size.  Pericardium: There is no evidence of pericardial effusion.  Mitral Valve: The mitral valve is normal in structure. Trivial mitral valve regurgitation. No evidence of mitral valve stenosis.  Tricuspid Valve: The tricuspid valve is normal in structure. Tricuspid valve regurgitation is trivial. No evidence of tricuspid  stenosis.  Aortic Valve: The aortic valve is tricuspid. Aortic valve regurgitation is not visualized. No aortic stenosis is present.  Pulmonic Valve: The pulmonic valve was normal in structure. Pulmonic valve regurgitation is not visualized. No evidence of pulmonic stenosis.  Aorta: The aortic root is normal in size and structure.  Venous: The inferior vena cava is normal in size with greater than 50% respiratory variability, suggesting right atrial pressure of 3 mmHg.  IAS/Shunts: No atrial level shunt detected by color flow Doppler.   LEFT VENTRICLE PLAX 2D LVIDd:         4.40 cm   Diastology LVIDs:         2.60 cm   LV e' medial:    5.33 cm/s LV PW:         1.20 cm   LV E/e' medial:  14.4 LV IVS:        1.40 cm   LV e' lateral:   8.59 cm/s LVOT diam:     2.00 cm   LV E/e' lateral: 8.9 LV SV:  63 LV SV Index:   31 LVOT Area:     3.14 cm   RIGHT VENTRICLE             IVC RV S prime:     17.40 cm/s  IVC diam: 1.30 cm TAPSE (M-mode): 2.5 cm RVSP:           27.8 mmHg  LEFT ATRIUM             Index        RIGHT ATRIUM           Index LA diam:        3.30 cm 1.61 cm/m   RA Pressure: 3.00 mmHg LA Vol (A2C):   60.2 ml 29.32 ml/m  RA Area:     14.40 cm LA Vol (A4C):   63.4 ml 30.88 ml/m  RA Volume:   32.40 ml  15.78 ml/m LA Biplane Vol: 64.9 ml 31.61 ml/m AORTIC VALVE LVOT Vmax:   87.80 cm/s LVOT Vmean:  58.500 cm/s LVOT VTI:    0.200 m  AORTA Ao Root diam: 2.90 cm Ao Asc diam:  3.10 cm  MITRAL VALVE               TRICUSPID VALVE MV Area (PHT): 3.70 cm    TR Peak grad:   24.8 mmHg MV Decel Time: 205 msec    TR Vmax:        249.00 cm/s MV E velocity: 76.50 cm/s  Estimated RAP:  3.00 mmHg MV A velocity: 95.90 cm/s  RVSP:           27.8 mmHg MV E/A ratio:  0.80 SHUNTS Systemic VTI:  0.20 m Systemic Diam: 2.00 cm  Maudine Sos MD Electronically signed by Maudine Sos MD Signature Date/Time: 10/12/2022/4:01:17 PM    Final    MONITORS  LONG  TERM MONITOR (3-14 DAYS) 10/09/2022  Narrative Patch Wear Time:  11 days and 12 hours (2024-06-21T10:01:26-0400 to 2024-07-02T22:48:55-0400)  Patient had a min HR of 47 bpm, max HR of 144 bpm, and avg HR of 85 bpm. Predominant underlying rhythm was Sinus Rhythm. 1 run of Supraventricular Tachycardia occurred lasting 9 beats with a max rate of 105 bpm (avg 97 bpm). Second Degree AV Block-Mobitz  I (Wenckebach) was present. No VT, atrial fibrillation, high degree block, or pauses noted. Isolated atrial and ventricular ectopy was rare (<1%). There were 4 triggered events, which were sinus rhythm. No high risk arrhythmias detected.       ______________________________________________________________________________________________     Recent Labs: 09/25/2022: NT-Pro BNP 119 09/26/2022: TSH 1.124 05/23/2023: ALT 20; BUN 54; Creatinine, Ser 2.44; Hemoglobin 9.5; Magnesium 2.4; Platelets 310; Potassium 4.4; Sodium 141  Recent Lipid Panel    Component Value Date/Time   CHOL 148 08/16/2020 0952   TRIG 69 08/16/2020 0952   HDL 59 08/16/2020 0952   CHOLHDL 2.5 08/16/2020 0952   LDLCALC 75 08/16/2020 0952    History of Present Illness    58 year old female with the above past medical history including paroxysmal atrial fibrillation, dyspnea on exertion, hypertension, type 2 diabetes, CKD stage III with history of AKI, SBO s/p surgery, asthma, and obesity.  She has a history of paroxysmal atrial fibrillation, followed by the A-fib clinic. She is on flecainide , diltiazem , Xarelto .  She has chronic issues with lower extremity edema, lightheadedness, fatigue.  Monitor in 10/2022 showed predominantly sinus rhythm, 1 run of SVT lasting 9 beats, second-degree AV block Mobitz type I, no other significant arrhythmia.  Echocardiogram in 10/2022 showed EF 70 to 75%, hyperdynamic LV function, no RWMA, moderate asymmetric LVH, G1 DD, normal RV systolic function, no significant valvular abnormalities.  Lexiscan  Myoview   in 10/2022 was negative for ischemia, EF 55 to 65%.  She was last seen in the office on 06/10/2023 and was stable from a cardiac standpoint. CPX was previously ordered but not completed due to staffing.  BP was elevated above goal.  She was transitioned from Ozempic  to Mounjaro . She contacted our office on 09/13/2023 with concerns for generalized fatigue, weakness, muscle cramping, and shortness of breath with activity.  She presents today for follow-up.  Since her last visit she has been stable from a cardiac standpoint. She does report a 1 to 2-week history of feeling winded with minimal activity.  She denies any significant palpitations but does note an awareness of my heart beating when laying down to sleep at night.  She denies any chest pain, dizziness, edema, PND, orthopnea, weight gain.  Weight is down.  She reports that she had similar symptoms in the past when her potassium was elevated.  She is concerned that her potassium may once again be elevated.  Home Medications    Current Outpatient Medications  Medication Sig Dispense Refill   atorvastatin  (LIPITOR) 40 MG tablet Take 1 tablet (40 mg total) by mouth daily. 90 tablet 3   diltiazem  (CARDIZEM  CD) 180 MG 24 hr capsule Take 1 capsule (180 mg total) by mouth daily. 90 capsule 3   diltiazem  (CARDIZEM ) 30 MG tablet Take 1 tablet (30 mg total) by mouth 2 (two) times daily. As needed for palpitations 180 tablet 3   FARXIGA  5 MG TABS tablet Take 5 mg by mouth daily.     flecainide  (TAMBOCOR ) 100 MG tablet Take 1 tablet (100 mg total) by mouth 2 (two) times daily. 180 tablet 3   fludrocortisone (FLORINEF) 0.1 MG tablet Take 100 mcg by mouth 2 (two) times daily.     glimepiride (AMARYL) 4 MG tablet Take 1 tablet by mouth daily.     insulin  isophane & regular human KwikPen (NOVOLIN  70/30 KWIKPEN) (70-30) 100 UNIT/ML KwikPen Inject 14 Units into the skin in the morning and at bedtime.     Insulin  Pen Needle 31G X 4 MM MISC 1 Container by Does not  apply route 2 (two) times daily. 100 each 0   Multiple Vitamins-Minerals (CENTRUM SILVER 50+WOMEN PO) Take 1 tablet by mouth 1 day or 1 dose.     rivaroxaban  (XARELTO ) 20 MG TABS tablet Take 1 tablet (20 mg total) by mouth daily with supper. 90 tablet 3   sodium zirconium cyclosilicate  (LOKELMA ) 10 g PACK packet Take 10 g by mouth 2 (two) times daily. 180 packet 0   tirzepatide  (MOUNJARO ) 15 MG/0.5ML Pen Inject 15 mg into the skin once a week. 2 mL 0   albuterol  (VENTOLIN  HFA) 108 (90 Base) MCG/ACT inhaler Inhale 2 puffs into the lungs every 4 (four) hours as needed for wheezing or shortness of breath. (Patient not taking: Reported on 09/15/2023) 1 each 0   benzonatate  (TESSALON ) 200 MG capsule Take 1 capsule (200 mg total) by mouth 3 (three) times daily as needed for cough. (Patient not taking: Reported on 09/15/2023) 21 capsule 0   hydrochlorothiazide  (HYDRODIURIL ) 25 MG tablet Take 25 mg by mouth daily. (Patient not taking: Reported on 09/15/2023)     olmesartan -hydrochlorothiazide  (BENICAR  HCT) 40-25 MG tablet Take 1 tablet by mouth daily. (Patient not taking: Reported on 09/15/2023)  tirzepatide  (MOUNJARO ) 10 MG/0.5ML Pen Inject 10 mg into the skin once a week. (Patient not taking: Reported on 09/15/2023) 2 mL 0   tirzepatide  (MOUNJARO ) 12.5 MG/0.5ML Pen Inject 12.5 mg into the skin once a week. (Patient not taking: Reported on 09/15/2023) 2 mL 0   No current facility-administered medications for this visit.     Review of Systems    She denies chest pain, pnd, orthopnea, n, v, dizziness, syncope, edema, weight gain, or early satiety. All other systems reviewed and are otherwise negative except as noted above.   Physical Exam    VS:  BP 130/72   Pulse 70   Ht 5' 2.5 (1.588 m)   Wt 211 lb (95.7 kg)   LMP 12/24/2015 (Exact Date)   SpO2 99%   BMI 37.98 kg/m  GEN: Well nourished, well developed, in no acute distress. HEENT: normal. Neck: Supple, no JVD, carotid bruits, or  masses. Cardiac: RRR, no murmurs, rubs, or gallops. No clubbing, cyanosis, edema.  Radials/DP/PT 2+ and equal bilaterally.  Respiratory:  Respirations regular and unlabored, clear to auscultation bilaterally. GI: Soft, nontender, nondistended, BS + x 4. MS: no deformity or atrophy. Skin: warm and dry, no rash. Neuro:  Strength and sensation are intact. Psych: Normal affect.  Accessory Clinical Findings    ECG personally reviewed by me today - EKG Interpretation Date/Time:  Wednesday September 15 2023 14:15:06 EDT Ventricular Rate:  70 PR Interval:  210 QRS Duration:  90 QT Interval:  418 QTC Calculation: 451 R Axis:   68  Text Interpretation: Sinus rhythm with 1st degree A-V block Cannot rule out Anterior infarct , age undetermined When compared with ECG of 16-Apr-2023 14:53, PREVIOUS ECG IS PRESENT Confirmed by Marlana Silvan ([id number]) on 09/15/2023 2:16:09 PM  - no acute changes.   Lab Results  Component Value Date   WBC 8.2 05/23/2023   HGB 9.5 (L) 05/23/2023   HCT 30.9 (L) 05/23/2023   MCV 88.8 05/23/2023   PLT 310 05/23/2023   Lab Results  Component Value Date   CREATININE 2.44 (H) 05/23/2023   BUN 54 (H) 05/23/2023   NA 141 05/23/2023   K 4.4 05/23/2023   CL 111 05/23/2023   CO2 20 (L) 05/23/2023   Lab Results  Component Value Date   ALT 20 05/23/2023   AST 21 05/23/2023   ALKPHOS 99 05/23/2023   BILITOT 0.6 05/23/2023   Lab Results  Component Value Date   CHOL 148 08/16/2020   HDL 59 08/16/2020   LDLCALC 75 08/16/2020   TRIG 69 08/16/2020   CHOLHDL 2.5 08/16/2020    Lab Results  Component Value Date   HGBA1C 8.7 (H) 09/26/2022    Assessment & Plan   1. Dyspnea on exertion: Echocardiogram in 10/2022 showed EF 70 to 75%, hyperdynamic LV function, no RWMA, moderate asymmetric LVH, G1 DD, normal RV systolic function, no significant valvular abnormalities.  Lexiscan  Myoview  in 10/2022 was negative for ischemia, EF 55 to 65%.  CPX was ordered due to ongoing symptoms.  She reports a 1 to 2-week history of feeling winded with minimal activity.  She denies chest pain. Euvolemic and well compensated on exam. Workup to date overall reassuring.  Will go ahead and schedule CPX.  Of note, she reports similar symptoms in the past when her potassium was elevated.  Will check CBC, CMET, TSH.   2.Paroxysmal atrial fibrillation: Maintaining NSR. She denies any significant palpitations but does note an awareness of my heart beating when laying  down to sleep at night.  Gust possible cardiac monitor, she declines today.  Continue to monitor symptoms.  Continue flecainide , diltiazem , Xarelto .  3. Hypertension: BP well controlled. Continue current antihypertensive regimen.   4. Hyperlipidemia: LDL was 75 in 08/2020.  Will have her update fasting lipid panel prior to next office visit.  Continue Lipitor.  5. Type 2 diabetes: A1c was 8.7 in 09/2022.  Will update A1c today per pt request.  Managed per PCP.  6. CKD stage III/hyperkalemia: She has a history of chronic hyperkalemia, on daily Lokelma .  Creatinine was stable at 2.44 in 05/2023, potassium was 4.4.  Repeat CMET pending as above.  Followed by nephrology.    7. Obesity: She has lost weight since transitioning from Ozempic  to Mounjaro .  Encouraged ongoing med, patient with diet and exercise.  8. Disposition: Follow-up as scheduled Dr. Veryl Gottron in 10/2023.      Jude Norton, NP 09/15/2023, 2:24 PM

## 2023-09-16 ENCOUNTER — Ambulatory Visit: Payer: Self-pay | Admitting: Nurse Practitioner

## 2023-09-16 LAB — COMPREHENSIVE METABOLIC PANEL WITH GFR
ALT: 21 IU/L (ref 0–32)
AST: 23 IU/L (ref 0–40)
Albumin: 4.3 g/dL (ref 3.8–4.9)
Alkaline Phosphatase: 133 IU/L — ABNORMAL HIGH (ref 44–121)
BUN/Creatinine Ratio: 22 (ref 9–23)
BUN: 49 mg/dL — ABNORMAL HIGH (ref 6–24)
Bilirubin Total: <0.2 mg/dL (ref 0.0–1.2)
CO2: 17 mmol/L — ABNORMAL LOW (ref 20–29)
Calcium: 9.7 mg/dL (ref 8.7–10.2)
Chloride: 109 mmol/L — ABNORMAL HIGH (ref 96–106)
Creatinine, Ser: 2.25 mg/dL — ABNORMAL HIGH (ref 0.57–1.00)
Globulin, Total: 2.8 g/dL (ref 1.5–4.5)
Glucose: 47 mg/dL — ABNORMAL LOW (ref 70–99)
Potassium: 5.1 mmol/L (ref 3.5–5.2)
Sodium: 144 mmol/L (ref 134–144)
Total Protein: 7.1 g/dL (ref 6.0–8.5)
eGFR: 25 mL/min/{1.73_m2} — ABNORMAL LOW (ref >59)

## 2023-09-16 LAB — CBC
Hematocrit: 34.5 % (ref 34.0–46.6)
Hemoglobin: 10.6 g/dL — ABNORMAL LOW (ref 11.1–15.9)
MCH: 26.8 pg (ref 26.6–33.0)
MCHC: 30.7 g/dL — ABNORMAL LOW (ref 31.5–35.7)
MCV: 87 fL (ref 79–97)
Platelets: 356 10*3/uL (ref 150–450)
RBC: 3.95 x10E6/uL (ref 3.77–5.28)
RDW: 14.2 % (ref 11.7–15.4)
WBC: 8.1 10*3/uL (ref 3.4–10.8)

## 2023-09-16 LAB — TSH: TSH: 0.873 u[IU]/mL (ref 0.450–4.500)

## 2023-09-17 ENCOUNTER — Encounter: Payer: Self-pay | Admitting: Nurse Practitioner

## 2023-09-22 ENCOUNTER — Other Ambulatory Visit: Payer: Self-pay | Admitting: Physician Assistant

## 2023-09-27 ENCOUNTER — Other Ambulatory Visit: Payer: Self-pay

## 2023-09-27 MED ORDER — FLECAINIDE ACETATE 100 MG PO TABS: 100.0000 mg | ORAL_TABLET | Freq: Two times a day (BID) | ORAL | 3 refills | Status: AC

## 2023-09-29 LAB — LIPID PANEL
Chol/HDL Ratio: 2.6 ratio (ref 0.0–4.4)
Cholesterol, Total: 111 mg/dL (ref 100–199)
HDL: 42 mg/dL (ref >39)
LDL Chol Calc (NIH): 55 mg/dL (ref 0–99)
Triglycerides: 67 mg/dL (ref 0–149)
VLDL Cholesterol Cal: 14 mg/dL (ref 5–40)

## 2023-10-15 ENCOUNTER — Telehealth (HOSPITAL_BASED_OUTPATIENT_CLINIC_OR_DEPARTMENT_OTHER): Payer: Self-pay | Admitting: Cardiology

## 2023-10-15 ENCOUNTER — Telehealth (HOSPITAL_BASED_OUTPATIENT_CLINIC_OR_DEPARTMENT_OTHER): Payer: Self-pay | Admitting: Pharmacist Clinician (PhC)/ Clinical Pharmacy Specialist

## 2023-10-15 NOTE — Telephone Encounter (Signed)
 I think this request was sent into the wrong pool. Not sure which pool handles PA

## 2023-10-15 NOTE — Telephone Encounter (Signed)
 Patient recently switched insurance from [facility] to [employer].  Please do new PA for Mounjaro .  Patient also notes that olmesartan  is not covered without PA.  Please do one for that as well.

## 2023-10-15 NOTE — Telephone Encounter (Signed)
 Pt dropped off [employer] prior auth request for prescription. Please fill out. Unsure of where to fax to bt can try the [place]  under[place] on first page. Copies have been made. And copy of insurance is attached. Will be in providers box.

## 2023-10-18 ENCOUNTER — Telehealth: Payer: Self-pay | Admitting: Pharmacy Technician

## 2023-10-18 ENCOUNTER — Other Ambulatory Visit (HOSPITAL_COMMUNITY): Payer: Self-pay

## 2023-10-18 DIAGNOSIS — I10 Essential (primary) hypertension: Secondary | ICD-10-CM | POA: Diagnosis not present

## 2023-10-18 DIAGNOSIS — N2589 Other disorders resulting from impaired renal tubular function: Secondary | ICD-10-CM | POA: Diagnosis not present

## 2023-10-18 DIAGNOSIS — E1122 Type 2 diabetes mellitus with diabetic chronic kidney disease: Secondary | ICD-10-CM | POA: Diagnosis not present

## 2023-10-18 DIAGNOSIS — N1832 Chronic kidney disease, stage 3b: Secondary | ICD-10-CM | POA: Diagnosis not present

## 2023-10-18 NOTE — Telephone Encounter (Signed)
 Pharmacy Patient Advocate Encounter   Received notification from Pt Calls Messages that prior authorization for Mounjaro  10MG  is required/requested.   Insurance verification completed.   The patient is insured through [employer] .   Per test claim: PA required; PA submitted to above mentioned insurance via CoverMyMeds Key/confirmation #/EOC [id number] Status is pending

## 2023-10-20 ENCOUNTER — Encounter (HOSPITAL_BASED_OUTPATIENT_CLINIC_OR_DEPARTMENT_OTHER): Payer: Self-pay | Admitting: Cardiology

## 2023-10-20 ENCOUNTER — Ambulatory Visit (HOSPITAL_BASED_OUTPATIENT_CLINIC_OR_DEPARTMENT_OTHER): Admitting: Cardiology

## 2023-10-20 ENCOUNTER — Other Ambulatory Visit (HOSPITAL_COMMUNITY): Payer: Self-pay

## 2023-10-20 VITALS — BP 120/70 | HR 82 | Ht 62.5 in | Wt 202.0 lb

## 2023-10-20 DIAGNOSIS — I10 Essential (primary) hypertension: Secondary | ICD-10-CM

## 2023-10-20 DIAGNOSIS — R0609 Other forms of dyspnea: Secondary | ICD-10-CM

## 2023-10-20 DIAGNOSIS — Z7901 Long term (current) use of anticoagulants: Secondary | ICD-10-CM

## 2023-10-20 DIAGNOSIS — D6869 Other thrombophilia: Secondary | ICD-10-CM

## 2023-10-20 DIAGNOSIS — I480 Paroxysmal atrial fibrillation: Secondary | ICD-10-CM | POA: Diagnosis not present

## 2023-10-20 DIAGNOSIS — E1169 Type 2 diabetes mellitus with other specified complication: Secondary | ICD-10-CM | POA: Diagnosis not present

## 2023-10-20 DIAGNOSIS — E7800 Pure hypercholesterolemia, unspecified: Secondary | ICD-10-CM

## 2023-10-20 DIAGNOSIS — E669 Obesity, unspecified: Secondary | ICD-10-CM

## 2023-10-20 NOTE — Progress Notes (Signed)
 Cardiology Office Note:  .   Date:  10/20/2023  ID:  Dawn Braun, DOB November 11, 1965, MRN [id number] PCP: Catalina Bare, MD  [facility] Providers Cardiologist:  Shelda Bruckner, MD {  History of Present Illness: SABRA   Dawn Braun is a 58 y.o. female with a hx of labile hypertension, prior SBO s/p surgery, uncontrolled type 2 diabetes with history of DKA, paroxysmal atrial fibrillation followed by afib clinic, asthma, stage 3 CKD with history of AKI, obesity who is seen for follow up today. I initially saw her 05/01/19 as a new consult at the request of Tanda Bleacher, MD for the evaluation and management of paroxysmal atrial fibrillation.   Pertinent CV history: Admitted 09/2022 for hyperkalemia, ARB stopped. Presented with hypertensive urgency. Has chronic issues with LE edema, lightheadedness, fatigue. Monitor 10/2022 unremarkable. Echo 10/2022 EF 70-75%, mild LVH, G1DD, normal RV, RAP 3, no significant valve disease. Stress test 10/2022 without ischemia.  Pertinent general history: Had ER visit 04/16/23 for hyperkalemia. Treated with lokelma  BID for months and florinef twice a day. She is on no medications that would raise potassium. Dr. Norine following closely.  Today: Has been exhausted and lightheaded for the last 5 weeks or so.  Has been very fatigued. No fevers/chills. Short of breath with minimal exertion. Severe bilateral shoulder pain, constant for the last two months.   Hasn't felt flutters in her chest but felt like how she felt when she was diagnosed with afib. HR has been steady on her fitbit.  Saw Dr. Kruska, BP was 98/60, so olmesartan -hydrochlorothiazide  has been held x 3 days. BP normal today with this on hold. Doesn't have a BP cuff at home that fits. Lightheadedness slightly better over the last few days but shortwindedness unchanged.  Has lost weight. Needs new PA for Mounjaro  and olmesartan  (though olmesartan  on hold currently). Just started 12.5  mg dose of mounjaro  today.  No longer on lokelma   ROS: Denies chest pain, shortness of breath at rest or with normal exertion. No PND, orthopnea, LE edema or unexpected weight gain. No syncope or palpitations. ROS otherwise negative except as noted.   Studies Reviewed: SABRA    EKG:       Physical Exam:   VS:  BP 120/70 (BP Location: Left Arm, Patient Position: Sitting, Cuff Size: Normal)   Pulse 82   Ht 5' 2.5 (1.588 m)   Wt 202 lb (91.6 kg)   LMP 12/24/2015 (Exact Date)   BMI 36.36 kg/m    Wt Readings from Last 3 Encounters:  10/20/23 202 lb (91.6 kg)  09/15/23 211 lb (95.7 kg)  06/10/23 222 lb 1.6 oz (100.7 kg)    GEN: Well nourished, well developed in no acute distress HEENT: Normal, moist mucous membranes NECK: No JVD CARDIAC: regular rhythm, normal S1 and S2, no rubs or gallops. No murmur. VASCULAR: Radial and DP pulses 2+ bilaterally. No carotid bruits RESPIRATORY:  Clear to auscultation without rales, wheezing or rhonchi  ABDOMEN: Soft, non-tender, non-distended MUSCULOSKELETAL:  Ambulates independently SKIN: Warm and dry, no edema NEUROLOGIC:  Alert and oriented x 3. No focal neuro deficits noted. PSYCHIATRIC:  Normal affect    ASSESSMENT AND PLAN: .    Exercise intolerance Dyspnea on exertion Fatigue -workup from a cardiac standpoint has been unremarkable. -ordered CPX in December but has not been able to perform due to lack of a Pensions consultant. Once BP stable will re-evaluate  Hyperkalemia Hypertension: Goal <130/80 -BP has been running low, recent 98/60 at  [facility]. Olmesartan -hydrochlorothiazide  now on hold, BP normal today -on diltiazem  for rate control as well as BP -following with Dr. Norine at [facility]  -no longer on lokelma  BID -check bmet today with med changes  Paroxysmal atrial fibrillation CHA2DS2/VAS Stroke Risk Points= 3  -on diltiazem , flecainide  -on rivaroxaban  for long term anticoagulation -stress test  normal  Hypercholesterolemia:  -tolerating atorvastatin  20 mg daily -recent labs reviewed   Type II diabetes Morbid obesity -BMI 39-> 36 -doing well on Mounjaro , needs new PA for this, will request -on Farxiga  -on atorvastatin  -no aspirin as she is on rivaroxaban   CV risk counseling and prevention -recommend heart healthy/Mediterranean diet, with whole grains, fruits, vegetable, fish, lean meats, nuts, and olive oil. Limit salt. -recommend moderate walking, 3-5 times/week for 30-50 minutes each session. Aim for at least 150 minutes.week. Goal should be pace of 3 miles/hours, or walking 1.5 miles in 30 minutes -recommend avoidance of tobacco products. Avoid excess alcohol.  Dispo: 3-4 weeks for nurse BP visit, 2 mos with APP or me  Signed, Shelda Bruckner, MD   Shelda Bruckner, MD, PhD, [facility]  [facility]  [place]  [facility] at [facility] at [facility] [address] [place], [place] [address] [phone]

## 2023-10-20 NOTE — Patient Instructions (Addendum)
 Medication Instructions:  HOLD YOUR OLMESARTAN  HCT   *If you need a refill on your cardiac medications before your next appointment, please call your pharmacy*  Lab Work: BMET/A1C SOON   Testing/Procedures: NONE   Follow-Up: NURSE VISIT 11/17/2023 11:00 AM AT [facility]  01/06/2024 9:15 AM WITH EMILY AT [facility] [facility]

## 2023-10-21 ENCOUNTER — Other Ambulatory Visit (HOSPITAL_COMMUNITY): Payer: Self-pay

## 2023-10-21 DIAGNOSIS — E669 Obesity, unspecified: Secondary | ICD-10-CM | POA: Diagnosis not present

## 2023-10-21 DIAGNOSIS — I10 Essential (primary) hypertension: Secondary | ICD-10-CM | POA: Diagnosis not present

## 2023-10-21 DIAGNOSIS — E1169 Type 2 diabetes mellitus with other specified complication: Secondary | ICD-10-CM | POA: Diagnosis not present

## 2023-10-21 NOTE — Telephone Encounter (Signed)
   Got this -but told them its already started on new insurance

## 2023-10-21 NOTE — Telephone Encounter (Signed)
 Pharmacy Patient Advocate Encounter  Received notification from [facility] that Prior Authorization for Mounjaro  has been APPROVED from 10/21/23 to 10/20/24. Ran test claim, Copay is $35.00- one month. This test claim was processed through [facility]- copay amounts may vary at other pharmacies due to pharmacy/plan contracts, or as the patient moves through the different stages of their insurance plan.   PA #/Case ID/Reference #: [id number]

## 2023-10-22 LAB — HEMOGLOBIN A1C
Est. average glucose Bld gHb Est-mCnc: 123 mg/dL
Hgb A1c MFr Bld: 5.9 % — ABNORMAL HIGH (ref 4.8–5.6)

## 2023-10-22 LAB — BASIC METABOLIC PANEL WITH GFR
BUN/Creatinine Ratio: 22 (ref 9–23)
BUN: 48 mg/dL — ABNORMAL HIGH (ref 6–24)
CO2: 18 mmol/L — ABNORMAL LOW (ref 20–29)
Calcium: 9.6 mg/dL (ref 8.7–10.2)
Chloride: 109 mmol/L — ABNORMAL HIGH (ref 96–106)
Creatinine, Ser: 2.21 mg/dL — ABNORMAL HIGH (ref 0.57–1.00)
Glucose: 59 mg/dL — ABNORMAL LOW (ref 70–99)
Potassium: 5 mmol/L (ref 3.5–5.2)
Sodium: 142 mmol/L (ref 134–144)
eGFR: 25 mL/min/1.73 — ABNORMAL LOW (ref >59)

## 2023-10-25 ENCOUNTER — Encounter (HOSPITAL_BASED_OUTPATIENT_CLINIC_OR_DEPARTMENT_OTHER): Payer: Self-pay | Admitting: *Deleted

## 2023-10-25 ENCOUNTER — Other Ambulatory Visit (HOSPITAL_BASED_OUTPATIENT_CLINIC_OR_DEPARTMENT_OTHER): Payer: Self-pay | Admitting: *Deleted

## 2023-10-25 MED ORDER — MOUNJARO 15 MG/0.5ML ~~LOC~~ SOAJ: 15.0000 mg | SUBCUTANEOUS | 3 refills | Status: DC

## 2023-10-29 DIAGNOSIS — E113513 Type 2 diabetes mellitus with proliferative diabetic retinopathy with macular edema, bilateral: Secondary | ICD-10-CM | POA: Diagnosis not present

## 2023-11-09 ENCOUNTER — Encounter (HOSPITAL_COMMUNITY): Payer: Self-pay | Admitting: *Deleted

## 2023-11-09 ENCOUNTER — Observation Stay (HOSPITAL_COMMUNITY)
Admission: EM | Admit: 2023-11-09 | Discharge: 2023-11-10 | Disposition: A | Discharge status: Still In Hospital | Attending: Student in an Organized Health Care Education/Training Program | Admitting: Student in an Organized Health Care Education/Training Program

## 2023-11-09 ENCOUNTER — Other Ambulatory Visit: Payer: Self-pay

## 2023-11-09 ENCOUNTER — Emergency Department (HOSPITAL_COMMUNITY)

## 2023-11-09 DIAGNOSIS — Z794 Long term (current) use of insulin: Secondary | ICD-10-CM | POA: Diagnosis not present

## 2023-11-09 DIAGNOSIS — I10 Essential (primary) hypertension: Secondary | ICD-10-CM | POA: Diagnosis not present

## 2023-11-09 DIAGNOSIS — Z79899 Other long term (current) drug therapy: Secondary | ICD-10-CM | POA: Diagnosis not present

## 2023-11-09 DIAGNOSIS — I480 Paroxysmal atrial fibrillation: Principal | ICD-10-CM | POA: Insufficient documentation

## 2023-11-09 DIAGNOSIS — I129 Hypertensive chronic kidney disease with stage 1 through stage 4 chronic kidney disease, or unspecified chronic kidney disease: Secondary | ICD-10-CM | POA: Insufficient documentation

## 2023-11-09 DIAGNOSIS — E162 Hypoglycemia, unspecified: Secondary | ICD-10-CM | POA: Diagnosis not present

## 2023-11-09 DIAGNOSIS — J45909 Unspecified asthma, uncomplicated: Secondary | ICD-10-CM | POA: Diagnosis not present

## 2023-11-09 DIAGNOSIS — E1122 Type 2 diabetes mellitus with diabetic chronic kidney disease: Secondary | ICD-10-CM | POA: Diagnosis not present

## 2023-11-09 DIAGNOSIS — E11649 Type 2 diabetes mellitus with hypoglycemia without coma: Secondary | ICD-10-CM | POA: Insufficient documentation

## 2023-11-09 DIAGNOSIS — R4182 Altered mental status, unspecified: Secondary | ICD-10-CM | POA: Diagnosis not present

## 2023-11-09 DIAGNOSIS — Z7901 Long term (current) use of anticoagulants: Secondary | ICD-10-CM | POA: Insufficient documentation

## 2023-11-09 DIAGNOSIS — N183 Chronic kidney disease, stage 3 unspecified: Secondary | ICD-10-CM | POA: Diagnosis not present

## 2023-11-09 DIAGNOSIS — R404 Transient alteration of awareness: Secondary | ICD-10-CM

## 2023-11-09 LAB — URINALYSIS, W/ REFLEX TO CULTURE (INFECTION SUSPECTED)
Bacteria, UA: NONE SEEN
Bilirubin Urine: NEGATIVE
Glucose, UA: 50 mg/dL — AB
Ketones, ur: NEGATIVE mg/dL
Leukocytes,Ua: NEGATIVE
Nitrite: NEGATIVE
Protein, ur: 100 mg/dL — AB
Specific Gravity, Urine: 1.014 (ref 1.005–1.030)
pH: 5 (ref 5.0–8.0)

## 2023-11-09 LAB — COMPREHENSIVE METABOLIC PANEL WITH GFR
ALT: 16 U/L (ref 0–44)
AST: 23 U/L (ref 15–41)
Albumin: 3.6 g/dL (ref 3.5–5.0)
Alkaline Phosphatase: 99 U/L (ref 38–126)
Anion gap: 6 (ref 5–15)
BUN: 34 mg/dL — ABNORMAL HIGH (ref 6–20)
CO2: 22 mmol/L (ref 22–32)
Calcium: 9 mg/dL (ref 8.9–10.3)
Chloride: 111 mmol/L (ref 98–111)
Creatinine, Ser: 1.94 mg/dL — ABNORMAL HIGH (ref 0.44–1.00)
GFR, Estimated: 29 mL/min — ABNORMAL LOW (ref >60)
Glucose, Bld: 36 mg/dL — CL (ref 70–99)
Potassium: 4.6 mmol/L (ref 3.5–5.1)
Sodium: 139 mmol/L (ref 135–145)
Total Bilirubin: 0.4 mg/dL (ref 0.0–1.2)
Total Protein: 6.7 g/dL (ref 6.5–8.1)

## 2023-11-09 LAB — GLUCOSE, CAPILLARY
Glucose-Capillary: 89 mg/dL (ref 70–99)
Glucose-Capillary: 215 mg/dL — ABNORMAL HIGH (ref 70–99)

## 2023-11-09 LAB — CBC WITH DIFFERENTIAL/PLATELET
Abs Immature Granulocytes: 0.01 K/uL (ref 0.00–0.07)
Basophils Absolute: 0 K/uL (ref 0.0–0.1)
Basophils Relative: 0 %
Eosinophils Absolute: 0.2 K/uL (ref 0.0–0.5)
Eosinophils Relative: 3 %
HCT: 31.9 % — ABNORMAL LOW (ref 36.0–46.0)
Hemoglobin: 9.9 g/dL — ABNORMAL LOW (ref 12.0–15.0)
Immature Granulocytes: 0 %
Lymphocytes Relative: 31 %
Lymphs Abs: 2.5 K/uL (ref 0.7–4.0)
MCH: 27 pg (ref 26.0–34.0)
MCHC: 31 g/dL (ref 30.0–36.0)
MCV: 87.2 fL (ref 80.0–100.0)
Monocytes Absolute: 0.5 K/uL (ref 0.1–1.0)
Monocytes Relative: 6 %
Neutro Abs: 4.8 K/uL (ref 1.7–7.7)
Neutrophils Relative %: 60 %
Platelets: 296 K/uL (ref 150–400)
RBC: 3.66 MIL/uL — ABNORMAL LOW (ref 3.87–5.11)
RDW: 15.2 % (ref 11.5–15.5)
WBC: 8 K/uL (ref 4.0–10.5)
nRBC: 0 % (ref 0.0–0.2)

## 2023-11-09 LAB — TSH: TSH: 0.574 u[IU]/mL (ref 0.350–4.500)

## 2023-11-09 LAB — SALICYLATE LEVEL: Salicylate Lvl: <7 mg/dL — ABNORMAL LOW (ref 7.0–30.0)

## 2023-11-09 LAB — RAPID URINE DRUG SCREEN, HOSP PERFORMED
Amphetamines: NOT DETECTED
Barbiturates: NOT DETECTED
Benzodiazepines: NOT DETECTED
Cocaine: NOT DETECTED
Opiates: NOT DETECTED
Tetrahydrocannabinol: NOT DETECTED

## 2023-11-09 LAB — LIPASE, BLOOD: Lipase: 41 U/L (ref 11–51)

## 2023-11-09 LAB — ETHANOL: Alcohol, Ethyl (B): <15 mg/dL (ref <15)

## 2023-11-09 LAB — CBG MONITORING, ED: Glucose-Capillary: 169 mg/dL — ABNORMAL HIGH (ref 70–99)

## 2023-11-09 LAB — MAGNESIUM: Magnesium: 2 mg/dL (ref 1.7–2.4)

## 2023-11-09 LAB — ACETAMINOPHEN LEVEL: Acetaminophen (Tylenol), Serum: <10 ug/mL — ABNORMAL LOW (ref 10–30)

## 2023-11-09 MED ORDER — DEXTROSE 10 % IV SOLN: INTRAVENOUS | Status: DC

## 2023-11-09 MED ORDER — ACETAMINOPHEN 650 MG RE SUPP: 650.0000 mg | Freq: Four times a day (QID) | RECTAL | Status: DC | PRN

## 2023-11-09 MED ORDER — HYDRALAZINE HCL 20 MG/ML IJ SOLN
5.0000 mg | Freq: Four times a day (QID) | INTRAMUSCULAR | Status: DC | PRN
  Administered 2023-11-09: 5 mg via INTRAVENOUS
  Filled 2023-11-09: qty 1

## 2023-11-09 MED ORDER — INSULIN ASPART 100 UNIT/ML IJ SOLN
0.0000 [IU] | Freq: Three times a day (TID) | INTRAMUSCULAR | Status: DC
  Administered 2023-11-10: 5 [IU] via SUBCUTANEOUS
  Filled 2023-11-09: qty 0.15

## 2023-11-09 MED ORDER — INSULIN ASPART 100 UNIT/ML IJ SOLN
0.0000 [IU] | Freq: Every day | INTRAMUSCULAR | Status: DC
  Administered 2023-11-09: 2 [IU] via SUBCUTANEOUS
  Filled 2023-11-09: qty 0.05

## 2023-11-09 MED ORDER — ONDANSETRON HCL 4 MG/2ML IJ SOLN: 4.0000 mg | Freq: Four times a day (QID) | INTRAMUSCULAR | Status: DC | PRN

## 2023-11-09 MED ORDER — ALBUTEROL SULFATE (2.5 MG/3ML) 0.083% IN NEBU: 2.5000 mg | INHALATION_SOLUTION | RESPIRATORY_TRACT | Status: DC | PRN

## 2023-11-09 MED ORDER — DEXTROSE 50 % IV SOLN
1.0000 | Freq: Once | INTRAVENOUS | Status: AC
  Administered 2023-11-09: 50 mL via INTRAVENOUS

## 2023-11-09 MED ORDER — ONDANSETRON HCL 4 MG PO TABS: 4.0000 mg | ORAL_TABLET | Freq: Four times a day (QID) | ORAL | Status: DC | PRN

## 2023-11-09 MED ORDER — DEXTROSE 50 % IV SOLN
INTRAVENOUS | Status: AC
  Administered 2023-11-09: 50 mL via INTRAVENOUS
  Filled 2023-11-09: qty 50

## 2023-11-09 MED ORDER — SODIUM CHLORIDE 0.9 % IV BOLUS
500.0000 mL | Freq: Once | INTRAVENOUS | Status: AC
  Administered 2023-11-09: 500 mL via INTRAVENOUS

## 2023-11-09 MED ORDER — FLECAINIDE ACETATE 100 MG PO TABS
100.0000 mg | ORAL_TABLET | Freq: Two times a day (BID) | ORAL | Status: DC
Start: 2023-11-09 — End: 2023-11-10
  Administered 2023-11-09 – 2023-11-10 (×2): 100 mg via ORAL
  Filled 2023-11-09 (×2): qty 1

## 2023-11-09 MED ORDER — TRAZODONE HCL 50 MG PO TABS
25.0000 mg | ORAL_TABLET | Freq: Every evening | ORAL | Status: DC | PRN
  Administered 2023-11-09: 25 mg via ORAL
  Filled 2023-11-09: qty 1

## 2023-11-09 MED ORDER — RIVAROXABAN 20 MG PO TABS
20.0000 mg | ORAL_TABLET | Freq: Every day | ORAL | Status: DC
Start: 2023-11-09 — End: 2023-11-10
  Administered 2023-11-09: 20 mg via ORAL
  Filled 2023-11-09: qty 1

## 2023-11-09 MED ORDER — ACETAMINOPHEN 325 MG PO TABS: 650.0000 mg | ORAL_TABLET | Freq: Four times a day (QID) | ORAL | Status: DC | PRN

## 2023-11-09 NOTE — ED Triage Notes (Signed)
 BIB family from home for AMS, confusion, speech change, visual changes, HA, altered balance, falling, HA, lower blood sugars, numbness in face. Onset noticed when she came home from work yesterday at [employer]. Works as a Teacher, English as a foreign language at [employer]. PT alert, NAD, calm, interactive. Self transfers from w/c to stretcher. Pt surprised that she is in the ED, and upset that family says she is confused. EDP updated.

## 2023-11-09 NOTE — ED Notes (Signed)
 USIV attempted and pt could not tolerate.

## 2023-11-09 NOTE — ED Provider Notes (Signed)
 Emergency Department Provider Note   I have reviewed the triage vital signs and the nursing notes.   HISTORY  Chief Complaint Altered Mental Status   HPI Dawn Braun is a 58 y.o. female past history reviewed below including diabetes and CKD presents to the emergency department with acute onset mental status change and gait instability.  Patient was last normal at 17:30 yesterday after returning home from work.  She drove her husband to the store and then brought him back home.  She was going out to run another area with her son, which they did, but upon returning home their son ran into the house to get the husband urgently.  He states that she was suddenly acting very strange.  She was giggling and laughing inappropriately.  She seemed confused.  She been complaining of feeling off balance.  She notes that with looking at the computer screen she would occasionally see double and sometimes nothing at all.  She denies any headaches or fever.  No new medications.  She drinks alcohol occasionally although not recently.  Denies illicit drugs.  Past Medical History:  Diagnosis Date   Asthma    Chronic kidney disease (CKD) stage G3a/A3, moderately decreased glomerular filtration rate (GFR) between 45-59 mL/min/1.73 square meter and albuminuria creatinine ratio greater than 300 mg/g (HCC)    Complication of anesthesia    takes me awhile to wakeup (05/17/2017)   Hypertension    Intractable nausea and vomiting    Microalbuminuria due to type 2 diabetes mellitus (HCC)    Paroxysmal atrial fibrillation (HCC)    Small bowel obstruction (HCC) 03/26/2019   Type II diabetes mellitus (HCC)     Review of Systems  Constitutional: No fever/chills Cardiovascular: Denies chest pain. Respiratory: Denies shortness of breath. Gastrointestinal: No abdominal pain.  No nausea, no vomiting.  Skin: Negative for rash. Neurological: Negative for  headaches.  ____________________________________________   PHYSICAL EXAM:  VITAL SIGNS: ED Triage Vitals  Encounter Vitals Group     BP 11/09/23 1059 (!) 180/83     Pulse Rate 11/09/23 1059 75     Resp 11/09/23 1059 16     Temp 11/09/23 1059 (!) 97.5 F (36.4 C)     Temp Source 11/09/23 1059 Oral     SpO2 11/09/23 1059 100 %     Weight 11/09/23 1105 202 lb (91.6 kg)   Constitutional: Alert and oriented but occasionally inappropriately smiling or giggling. No distress.  Eyes: Conjunctivae are normal. PERRL. EOMI. Head: Atraumatic. Nose: No congestion/rhinnorhea. Mouth/Throat: Mucous membranes are moist.  Neck: No stridor.  Cardiovascular: Normal rate, regular rhythm. Good peripheral circulation. Grossly normal heart sounds.   Respiratory: Normal respiratory effort.  No retractions. Lungs CTAB. Gastrointestinal: Soft and nontender. No distention.  Musculoskeletal: No lower extremity tenderness nor edema. No gross deformities of extremities. Neurologic:  Normal speech and language. No gross focal neurologic deficits are appreciated.  5 strength in bilateral upper and lower extremities with normal sensation throughout. Skin:  Skin is warm, dry and intact. No rash noted.  ____________________________________________   LABS (all labs ordered are listed, but only abnormal results are displayed)  Labs Reviewed  COMPREHENSIVE METABOLIC PANEL WITH GFR - Abnormal; Notable for the following components:      Result Value   Glucose, Bld 36 (*)    BUN 34 (*)    Creatinine, Ser 1.94 (*)    GFR, Estimated 29 (*)    All other components within normal limits  ACETAMINOPHEN  LEVEL -  Abnormal; Notable for the following components:   Acetaminophen  (Tylenol ), Serum <10 (*)    All other components within normal limits  SALICYLATE LEVEL - Abnormal; Notable for the following components:   Salicylate Lvl <7.0 (*)    All other components within normal limits  CBC WITH DIFFERENTIAL/PLATELET -  Abnormal; Notable for the following components:   RBC 3.66 (*)    Hemoglobin 9.9 (*)    HCT 31.9 (*)    All other components within normal limits  URINALYSIS, W/ REFLEX TO CULTURE (INFECTION SUSPECTED) - Abnormal; Notable for the following components:   Color, Urine STRAW (*)    Glucose, UA 50 (*)    Hgb urine dipstick SMALL (*)    Protein, ur 100 (*)    All other components within normal limits  BASIC METABOLIC PANEL WITH GFR - Abnormal; Notable for the following components:   Sodium 134 (*)    CO2 20 (*)    Glucose, Bld 244 (*)    BUN 29 (*)    Creatinine, Ser 1.87 (*)    Calcium  8.6 (*)    GFR, Estimated 31 (*)    All other components within normal limits  CBC - Abnormal; Notable for the following components:   RBC 3.58 (*)    Hemoglobin 9.6 (*)    HCT 31.3 (*)    All other components within normal limits  GLUCOSE, CAPILLARY - Abnormal; Notable for the following components:   Glucose-Capillary 215 (*)    All other components within normal limits  GLUCOSE, CAPILLARY - Abnormal; Notable for the following components:   Glucose-Capillary 218 (*)    All other components within normal limits  GLUCOSE, CAPILLARY - Abnormal; Notable for the following components:   Glucose-Capillary 153 (*)    All other components within normal limits  CBG MONITORING, ED - Abnormal; Notable for the following components:   Glucose-Capillary 169 (*)    All other components within normal limits  ETHANOL  LIPASE, BLOOD  TSH  RAPID URINE DRUG SCREEN, HOSP PERFORMED  MAGNESIUM  HIV ANTIBODY (ROUTINE TESTING W REFLEX)  GLUCOSE, CAPILLARY  GLUCOSE, CAPILLARY   ____________________________________________  EKG   EKG Interpretation Date/Time:  Tuesday November 09 2023 11:33:28 EDT Ventricular Rate:  68 PR Interval:  197 QRS Duration:  102 QT Interval:  396 QTC Calculation: 422 R Axis:   66  Text Interpretation: Sinus rhythm Low voltage, precordial leads Confirmed by Darra Chew [id number]) on  11/10/2023 11:19:41 AM        ____________________________________________   PROCEDURES  Procedure(s) performed:   Procedures  CRITICAL CARE Performed by: Chew KANDICE Darra Total critical care time: 35 minutes Critical care time was exclusive of separately billable procedures and treating other patients. Critical care was necessary to treat or prevent imminent or life-threatening deterioration. Critical care was time spent personally by me on the following activities: development of treatment plan with patient and/or surrogate as well as nursing, discussions with consultants, evaluation of patient's response to treatment, examination of patient, obtaining history from patient or surrogate, ordering and performing treatments and interventions, ordering and review of laboratory studies, ordering and review of radiographic studies, pulse oximetry and re-evaluation of patient's condition.  Chew Darra, MD Emergency Medicine  ____________________________________________   INITIAL IMPRESSION / ASSESSMENT AND PLAN / ED COURSE  Pertinent labs & imaging results that were available during my care of the patient were reviewed by me and considered in my medical decision making (see chart for details).   This patient  is Presenting for Evaluation of AMS, which does require a range of treatment options, and is a complaint that involves a high risk of morbidity and mortality.  The Differential Diagnoses includes but is not exclusive to alcohol, illicit or prescription medications, intracranial pathology such as stroke, intracerebral hemorrhage, fever or infectious causes including sepsis, hypoxemia, uremia, trauma, endocrine related disorders such as diabetes, hypoglycemia, thyroid -related diseases, etc.   Critical Interventions-    Medications  sodium chloride  0.9 % bolus 500 mL (0 mLs Intravenous Stopped 11/09/23 1630)  dextrose  50 % solution 50 mL (50 mLs Intravenous Given 11/09/23 1422)     Reassessment after intervention: symptoms unchanged.    I did obtain Additional Historical Information from husband at bedside.    Clinical Laboratory Tests Ordered, included CBG low at 36. Baseline CKD noted. Normal. K. Mild anemia at 9.9. UA without infection.   Radiologic Tests Ordered, included CT head. I independently interpreted the images and agree with radiology interpretation.   Cardiac Monitor Tracing which shows NSR.    Social Determinants of Health Risk patient is a non-smoker.   Consult complete with [facility]. Plan for admit.   Medical Decision Making: Summary:  Patient presents emergency department altered mental status.  Last normal at 5:30 PM yesterday.  No focal deficits.  Patient has a general unconcerned demeanor with occasional inappropriate smiling.  Afebrile here with elevated BP.  Plan for AMS workup.  Reevaluation with update and discussion with patient and husband. Suspect hypoglycemia episodes leading to confusion and gait issues. Low suspicion for CVA at this time. Will admit for CBG monitoring.   Patient's presentation is most consistent with acute presentation with potential threat to life or bodily function.   Disposition: admit  ____________________________________________  FINAL CLINICAL IMPRESSION(S) / ED DIAGNOSES  Final diagnoses:  Hypoglycemia  Transient alteration of awareness     Note:  This document was prepared using Dragon voice recognition software and may include unintentional dictation errors.  Fonda Law, MD, [facility] Emergency Medicine    Jordan Caraveo, Fonda MATSU, MD 11/11/23 1009

## 2023-11-09 NOTE — H&P (Signed)
 History and Physical  Dawn Braun FMW:[id number] DOB: [birth year] DOA: 11/09/2023  PCP: Catalina Bare, MD   Chief Complaint: AMS  HPI: Dawn Braun is a 57 y.o. female with medical history significant for hypertension, paroxysmal atrial fibrillation on Xarelto , insulin -dependent type 2 diabetes CKD stage III being admitted to the hospital with altered mental status likely due to hypoglycemia.  History is provided by the patient as well as her husband who is at the bedside.  He states that she has had some episodes of hypoglycemia in the past but it has been several months.  Yesterday, she went out in the evening after work with her son, when they got home she was not acting like herself, she was acting kind of goofy and giggling, not answering questions appropriately, seemed to be a little bit out of balance.  For example, she bumped into a pillow in their bedroom, and told it excuse me.Family suspected that her blood sugar was low, but she states that she was not able to get herself together enough to check her blood sugar so it was never checked.  However, they had dinner together and after that she seemed to be behaving normally and went to bed.  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  A little bit later, she was getting a little bit dizzy felt like her vision was blurry, she called her husband for help.  Once again, she was acting kind of giggly and silly, seemed to be a little bit unsteady on her feet.  He told her to get dressed so they could come to the hospital, he came back and found that she had tried to put her bra on over her nightgown, tucked her nightgown into her panties, etc.  Here in the emergency department, blood work showed a blood sugar of 36.  She was given some dextrose  and something to eat, her blood sugar improved over 160 and now her symptoms are completely resolved.  On further  discussion, patient states that she has probably lost about 25 pounds in the last couple months, recently her home blood pressure medications were discontinued.  Review of Systems: Please see HPI for pertinent positives and negatives. A complete 10 system review of systems are otherwise negative.  Past Medical History:  Diagnosis Date   Asthma    Chronic kidney disease (CKD) stage G3a/A3, moderately decreased glomerular filtration rate (GFR) between 45-59 mL/min/1.73 square meter and albuminuria creatinine ratio greater than 300 mg/g (HCC)    Complication of anesthesia    takes me awhile to wakeup (05/17/2017)   Hypertension    Intractable nausea and vomiting    Microalbuminuria due to type 2 diabetes mellitus (HCC)    Paroxysmal atrial fibrillation (HCC)    Small bowel obstruction (HCC) 03/26/2019   Type II diabetes mellitus (HCC)    Past Surgical History:  Procedure Laterality Date   CESAREAN SECTION  2003   CHOLECYSTECTOMY N/A 12/23/2016   Procedure: LAPAROSCOPIC CHOLECYSTECTOMY;  Surgeon: Stevie Herlene Righter, MD;  Location: [facility];  Service: General;  Laterality: N/A;   COLONOSCOPY  05/02/2020   CG, MD   EYE SURGERY Right ~ 2016   bleeding issues; lasered; cauterized leaks   KNEE ARTHROSCOPY Right X 2   LAPAROTOMY N/A 03/26/2019   Procedure: EXPLORATORY LAPAROTOMY;  Surgeon: Eletha Boas, MD;  Location: [facility];  Service: General;  Laterality: N/A;  LYSIS OF ADHESION N/A 03/26/2019   Procedure: LYSIS OF ADHESION;  Surgeon: Eletha Boas, MD;  Location: [facility];  Service: General;  Laterality: N/A;   SPLIT NIGHT STUDY  08/06/2015   TUBAL LIGATION  2003   UPPER GASTROINTESTINAL ENDOSCOPY  05/02/2020   CG, MD   Social History:  reports that she has never smoked. She has never used smokeless tobacco. She reports current alcohol use of about 1.0 standard drink of alcohol per week. She reports that she does not use drugs.  Allergies  Allergen Reactions   Amoxicillin     Per pt  Rash    Family History  Problem Relation Age of Onset   Epilepsy Father    Diabetes Mother    Kidney disease Mother    Hypertension Mother    Kidney disease Brother    Diabetic kidney disease Brother    Diabetes Brother    Colon cancer Neg Hx    Pancreatic cancer Neg Hx    Esophageal cancer Neg Hx    Rectal cancer Neg Hx    Stomach cancer Neg Hx    Liver cancer Neg Hx      Prior to Admission medications   Medication Sig Start Date End Date Taking? Authorizing Provider  albuterol  (VENTOLIN  HFA) 108 (90 Base) MCG/ACT inhaler Inhale 2 puffs into the lungs every 4 (four) hours as needed for wheezing or shortness of breath. Patient not taking: Reported on 10/20/2023 08/12/23   Darral Longs, MD  atorvastatin  (LIPITOR) 40 MG tablet Take 1 tablet (40 mg total) by mouth daily. 11/03/22   Lucien Orren SAILOR, PA-C  diltiazem  (CARDIZEM  CD) 180 MG 24 hr capsule Take 1 capsule (180 mg total) by mouth daily. 12/28/22   Lucien Orren SAILOR, PA-C  diltiazem  (CARDIZEM ) 30 MG tablet Take 1 tablet (30 mg total) by mouth 2 (two) times daily. As needed for palpitations 12/28/22   Lucien Orren SAILOR, PA-C  FARXIGA  5 MG TABS tablet Take 5 mg by mouth daily. 05/15/23   [provider]  flecainide  (TAMBOCOR ) 100 MG tablet Take 1 tablet (100 mg total) by mouth 2 (two) times daily. 09/27/23   Lonni Slain, MD  fludrocortisone (FLORINEF) 0.1 MG tablet Take 100 mcg by mouth 2 (two) times daily. 05/13/23   [provider]  glimepiride (AMARYL) 4 MG tablet Take 1 tablet by mouth daily.    [provider]  insulin  isophane & regular human KwikPen (NOVOLIN  70/30 KWIKPEN) (70-30) 100 UNIT/ML KwikPen Inject 14 Units into the skin in the morning and at bedtime.    [provider]  Insulin  Pen Needle 31G X 4 MM MISC 1 Container by Does not apply route 2 (two) times daily. 08/03/19   Danton Jon HERO, PA-C  Multiple Vitamins-Minerals (CENTRUM SILVER 50+WOMEN PO) Take 1 tablet by mouth 1 day or 1  dose.    [provider]  olmesartan -hydrochlorothiazide  (BENICAR  HCT) 40-25 MG tablet Take 1 tablet by mouth daily. Patient not taking: Reported on 10/20/2023    [provider]  rivaroxaban  (XARELTO ) 20 MG TABS tablet Take 1 tablet (20 mg total) by mouth daily with supper. 11/03/22   Lucien Orren SAILOR, PA-C  sodium zirconium cyclosilicate  (LOKELMA ) 10 g PACK packet Take 10 g by mouth 2 (two) times daily. 11/11/22   Okey Vina GAILS, MD  tirzepatide  (MOUNJARO ) 12.5 MG/0.5ML Pen Inject 12.5 mg into the skin once a week. 09/10/23   Lonni Slain, MD  tirzepatide  (MOUNJARO ) 15 MG/0.5ML Pen Inject 15 mg  into the skin once a week. 10/25/23   Lonni Slain, MD    Physical Exam: BP (!) 169/91 (BP Location: Right Arm)   Pulse (!) 102   Temp 97.8 F (36.6 C) (Oral)   Resp 15   Wt 91.6 kg   LMP 12/24/2015 (Exact Date)   SpO2 100%   BMI 36.36 kg/m  General:  Alert, oriented x4, calm, in no acute distress, her husband is at the bedside Cardiovascular: RRR, no murmurs or rubs, no peripheral edema  Respiratory: clear to auscultation bilaterally, no wheezes, no crackles  Abdomen: soft, nontender, nondistended, normal bowel tones heard  Skin: dry, no rashes  Musculoskeletal: no joint effusions, normal range of motion  Psychiatric: appropriate affect, normal speech  Neurologic: extraocular muscles intact, clear speech, moving all extremities with intact sensorium         Labs on Admission:  Basic Metabolic Panel: Recent Labs  Lab 11/09/23 1300  NA 139  K 4.6  CL 111  CO2 22  GLUCOSE 36*  BUN 34*  CREATININE 1.94*  CALCIUM  9.0  MG 2.0   Liver Function Tests: Recent Labs  Lab 11/09/23 1300  AST 23  ALT 16  ALKPHOS 99  BILITOT 0.4  PROT 6.7  ALBUMIN 3.6   Recent Labs  Lab 11/09/23 1300  LIPASE 41   No results for input(s): AMMONIA in the last 168 hours. CBC: Recent Labs  Lab 11/09/23 1300  WBC 8.0  NEUTROABS 4.8  HGB 9.9*  HCT 31.9*  MCV  87.2  PLT 296   Cardiac Enzymes: No results for input(s): CKTOTAL, CKMB, CKMBINDEX, TROPONINI in the last 168 hours. BNP (last 3 results) No results for input(s): BNP in the last 8760 hours.  ProBNP (last 3 results) No results for input(s): PROBNP in the last 8760 hours.  CBG: Recent Labs  Lab 11/09/23 1419  GLUCAP 169*    Radiological Exams on Admission: CT Head Wo Contrast Result Date: 11/09/2023 EXAM: CT HEAD WITHOUT CONTRAST 11/09/2023 02:50:03 PM TECHNIQUE: CT of the head was performed without the administration of intravenous contrast. Automated exposure control, iterative reconstruction, and/or weight based adjustment of the mA/kV was utilized to reduce the radiation dose to as low as reasonably achievable. COMPARISON: CT head dated 10/25/2020. CLINICAL HISTORY: Mental status change, unknown cause. Mental status change; from home for AMS, confusion, speech change, visual changes, HA, altered balance, falling, HA, lower blood sugars, numbness in face. FINDINGS: BRAIN AND VENTRICLES: No acute hemorrhage. Gray-white differentiation is preserved. No hydrocephalus. No extra-axial collection. No mass effect or midline shift. ORBITS: Bilateral lens replacement. Chronic deformity of the left lamina papyracea. SINUSES: Mild mucosal thickening in the ethmoid sinuses. Mild secretions in the left sphenoid sinus. SOFT TISSUES AND SKULL: No acute soft tissue abnormality. No skull fracture. IMPRESSION: 1. No acute intracranial abnormality. Electronically signed by: Donnice Mania MD 11/09/2023 03:01 PM EDT RP Workstation: [id number]   Assessment/Plan Dawn Braun is a 58 y.o. female with medical history significant for hypertension, paroxysmal atrial fibrillation on Xarelto , insulin -dependent type 2 diabetes CKD stage III being admitted to the hospital with altered mental status likely due to hypoglycemia.  Symptomatic hypoglycemia-I suspect this is the source of her AMS, as the  patient has a history of this, yesterday her symptoms got better after having dinner, and today she had a documented blood sugar of 36 and her symptoms improved after she was given dextrose .  She had no documented or mention focal neurologic deficits, I think stroke is very  unlikely. -Observation admission -Adjust DM medications as below -Monitor closely in the hospital, consider MRI brain or further workup if symptoms recur without hypoglycemia  Hypertension-her home blood pressure medications were recently discontinued, likely due to improved blood pressure control with her weight loss.  However her blood pressure is currently elevated. -Will give a dose of IV hydralazine  -If blood pressure remains elevated may need to restart an oral antihypertensive  Insulin -dependent type 2 diabetes-well-controlled, in fact her last A1c on 10/21/2023 was 5.9.  I suspect that she has lost significant weight, her insulin  needs are reduced.  Currently she takes insulin  Novolin  12 units every morning. -Discontinue long-acting insulin  -Carb modified diet -Moderate dose sliding scale  Paroxysmal atrial fibrillation-she is currently in sinus rhythm -Continue home flecainide , Cardizem  -Xarelto  nightly for stroke prophylaxis  Hyperlipidemia-Lipitor    Code Status: Full Code  Consults called: None  Admission status: Observation  Time spent: 48 minutes  Ramie Palladino CHRISTELLA Gail MD [facility] Pager [phone]  If 7PM-7AM, please contact night-coverage [email or web address] Password [facility]  11/09/2023, 4:17 PM

## 2023-11-10 ENCOUNTER — Telehealth (HOSPITAL_COMMUNITY): Payer: Self-pay | Admitting: Pharmacy Technician

## 2023-11-10 ENCOUNTER — Other Ambulatory Visit (HOSPITAL_COMMUNITY): Payer: Self-pay

## 2023-11-10 DIAGNOSIS — E162 Hypoglycemia, unspecified: Secondary | ICD-10-CM | POA: Diagnosis not present

## 2023-11-10 LAB — CBC
HCT: 31.3 % — ABNORMAL LOW (ref 36.0–46.0)
Hemoglobin: 9.6 g/dL — ABNORMAL LOW (ref 12.0–15.0)
MCH: 26.8 pg (ref 26.0–34.0)
MCHC: 30.7 g/dL (ref 30.0–36.0)
MCV: 87.4 fL (ref 80.0–100.0)
Platelets: 276 K/uL (ref 150–400)
RBC: 3.58 MIL/uL — ABNORMAL LOW (ref 3.87–5.11)
RDW: 15.2 % (ref 11.5–15.5)
WBC: 7.4 K/uL (ref 4.0–10.5)
nRBC: 0 % (ref 0.0–0.2)

## 2023-11-10 LAB — GLUCOSE, CAPILLARY
Glucose-Capillary: 218 mg/dL — ABNORMAL HIGH (ref 70–99)
Glucose-Capillary: 83 mg/dL (ref 70–99)
Glucose-Capillary: 153 mg/dL — ABNORMAL HIGH (ref 70–99)

## 2023-11-10 LAB — BASIC METABOLIC PANEL WITH GFR
Anion gap: 10 (ref 5–15)
BUN: 29 mg/dL — ABNORMAL HIGH (ref 6–20)
CO2: 20 mmol/L — ABNORMAL LOW (ref 22–32)
Calcium: 8.6 mg/dL — ABNORMAL LOW (ref 8.9–10.3)
Chloride: 104 mmol/L (ref 98–111)
Creatinine, Ser: 1.87 mg/dL — ABNORMAL HIGH (ref 0.44–1.00)
GFR, Estimated: 31 mL/min — ABNORMAL LOW (ref >60)
Glucose, Bld: 244 mg/dL — ABNORMAL HIGH (ref 70–99)
Potassium: 4.5 mmol/L (ref 3.5–5.1)
Sodium: 134 mmol/L — ABNORMAL LOW (ref 135–145)

## 2023-11-10 LAB — HIV ANTIBODY (ROUTINE TESTING W REFLEX): HIV Screen 4th Generation wRfx: NONREACTIVE

## 2023-11-10 MED ORDER — INSULIN GLARGINE-YFGN 100 UNIT/ML ~~LOC~~ SOLN: 5.0000 [IU] | Freq: Every day | SUBCUTANEOUS | Status: DC

## 2023-11-10 NOTE — Telephone Encounter (Signed)
 Pharmacy Patient Advocate Encounter  Received notification from [facility] that Prior Authorization for Dexcom G7 Sensor has been APPROVED from 11/10/2023 to 11/09/2024. Ran test claim, Copay is $35.00. This test claim was processed through [facility]- copay amounts may vary at other pharmacies due to pharmacy/plan contracts, or as the patient moves through the different stages of their insurance plan.   PA #/Case ID/Reference #: [id number] Key: [id number]

## 2023-11-10 NOTE — Inpatient Diabetes Management (Addendum)
 Inpatient Diabetes Program Recommendations  AACE/ADA: New Consensus Statement on Inpatient Glycemic Control (2015)  Target Ranges:  Prepandial:   less than 140 mg/dL      Peak postprandial:   less than 180 mg/dL (1-2 hours)      Critically ill patients:  140 - 180 mg/dL    Latest Reference Range & Units 10/21/23 11:45  Hemoglobin A1C 4.8 - 5.6 % 5.9 (H)    Latest Reference Range & Units 11/09/23 14:19 11/09/23 17:06 11/09/23 20:52 11/10/23 07:18 11/10/23 11:19  Glucose-Capillary 70 - 99 mg/dL 830 (H) 89 784 (H)  2 units Novolog   218 (H)  5 units Novolog   83  (H): Data is abnormally high   Admit with: Hypoglycemia  History: DM2  Home DM Meds: Amaryl 4 mg daily       Farxiga  5 mg daily       Humulin 70/30 Insulin  10 units BID       Mounjaro  12.5 mg Qweek  Current Orders: Novolog  Moderate Correction Scale/ SSI (0-15 units) TID AC    PCP: Dr. Catalina  Current A1c down to 5.9% Has had significant weight loss Having HYPO events at home Likely needs Diabetes Meds reduced for home and f/u w/ PCP   MD- Pt stated she has been having issues with Hypoglycemia at home.  States she has only been taking 70/30 Insulin  once daily (usually 12 units--takes 10 units on the day she takes her Mounjaro ).  Has lost 30 pounds.    Please consider for discharge: 1. Continue Farxiga  5 mg daily 2. Continue Mounjaro  12.5 mg Qweek 3. Stop Amaryl 4 mg daily 4. Stop insulin  for now and have pt follow CBGs at home and follow up with PCP    Addendum 11am- Met w/ pt at husband at bedside.  Pt told me she really can't remember what happened at home.  Told me she felt like she was dreaming.  Husband recognized HYPO symptoms but did not check her CBG.  Brought her to the ED.  Pt admits to about a 30pound weight loss over the past 4 mos after starting Mounjaro  (has been slowly increasing her doses based on PCP instructions).  Usually takes the Humulin 70/30 Insulin  12 units daily in the AM but only  takes 10 units on the day she takes her Mounjaro .  Still taking the Farxiga  and the Amaryl daily as well.  Does not check her CBGs often.  I asked if she ever checks when she has mild HYPO symptoms and pt stated she usually has the HYPO at work and doesn't have her CBG meter.  We talked about how her weight loss and the addition of Mounjaro  has likely led to the need for a reduction of her other diabetes meds.  I reviewed her A1c of 5.9% with her (taken in July) and how this is a fantastic A1c as long as she is not having issues with HYPO which she is.  Pt open to reduction/stopping some of her DM meds if needed.  I discussed with pt that the discharging MD may reduce/stop her insulin  for now along with possibly her Amaryl.  We talked about how each of her DM meds and which ones have higher risk of HYPO.  We also discussed how pt needs to eat a meal when she takes the insulin  (she often waits 1 hour before eating when she takes her insulin ).  Asked pt to take her CBG meter with her to work and check her CBGs  at least BID for now.  Asked pt to alert her co-workers to symptoms of HYPO so they can assist her at work if needed.  Pt keeps juice and other low treatments at work.  Strongly encouraged pt to follow up with her PCP/or other MDs who help her with CBG control if we make any changes to her DM meds.  Pt agreeable and very appreciative of my visit.   Addendum 2pm--Met w/ pt again.  MD requesting pt be given sample of CGM prior to d/c to better monitor glucose levels at home.  Pt's cell phone only had 6% battery left and pt requested I review verbally with her how to set up the app and apply the sensor--pt also stated she is very tech savvy and feels like she will not have issues starting her 1st sensor at home independently--also told me her son can assist her as well.  I was able to show pt the app to download on her phone and requested she set up an account with [employer] when she gets home and can charge her  phone battery.  Explained to pt that the app will have her set up an account and then walk her step by step through the functionality of the app and then eventually how to place the 1st sensor.  Explained to pt that there are either videos or written instructions in the app and asked her to either watch the videos or read the written instructions.  Pt also directed to the Dex[employer] G7 website for more info.  Reviewed Dexcom G7 CGM system (how to use, how to place new sensor, sensor life, troubleshooting, warm-up time, how to use app on phone, rotation of insertion sites, etc).  Pt knows they will able to check app for glucose readings 30 min after sensor placed.  Pt instructed to replace sensor in 10 days and use opposite arm.  Encouraged pt to open app and look at sensor readings pre and post meals.  Reviewed healthy glucose goals for home.  Pt asked to seek refills for the sensors from their PCP.  Pt educated to check fingerstick CBG with traditional CBG meter when glucose reading does not match how they feels.  RN/MD made aware that CGM samples given.      --Will follow patient during hospitalization--  Adina Rudolpho Arrow RN, MSN, CDCES Diabetes Coordinator Inpatient Glycemic Control Team Team Pager: [phone] (8a-5p)

## 2023-11-10 NOTE — Plan of Care (Signed)

## 2023-11-10 NOTE — Discharge Instructions (Signed)
 Please continue to wear your glucose monitor and check your glucose frequently.  Record the results to share with your primary doctor on follow up in 1-2 weeks.  For now, I recommend you do not take insulin  to avoid having episodes of dangerously low blood sugars.

## 2023-11-10 NOTE — Discharge Summary (Signed)
 Physician Discharge Summary  Patient: Dawn Braun FMW:[id number] DOB: 09-29-1965   Code Status: Full Code Admit date: 11/09/2023 Discharge date: 11/10/2023 Disposition: Home, No home health services recommended PCP: Catalina Bare, MD  Recommendations for Outpatient Follow-up:  Follow up with PCP within 1-2 weeks Regarding general hospital follow up and preventative care Recommend reviewing glucose results and discussing insulin  restarting if indicated  Discharge Diagnoses:  Principal Problem:   Hypoglycemia  Brief Hospital Course Summary: Dawn Braun is a 58 y.o. female with medical history significant for hypertension, paroxysmal atrial fibrillation on Xarelto , insulin -dependent type 2 diabetes CKD stage III being admitted to the hospital with altered mental status likely due to hypoglycemia.   Mental status returned to baseline after correcting for hypoglycemia.  Head CT was negative for acute intracranial abnormalities.  Vitals remained stable and workup was otherwise pretty unremarkable for contributions to her presentation.  She was originally treated with IV dextrose  and holding of insulin .  Her glucose after stopping the drip was 83, then 153. She did not receive insulin  since it was discontinued.  Ordered continuous glucose monitor which she received education on from the diabetes coordinator and was able to wear it home. For now, insulin  has been instructed to be discontinued as well as glimepiride.   All other chronic conditions were treated with home medications.    Discharge Condition: Good, improved Recommended discharge diet: Diabetic diet  Consultations: None   Procedures/Studies: None   Discharge Instructions     Discharge patient   Complete by: As directed    Discharge disposition: 01-Home or Self Care   Discharge patient date: 11/10/2023      Allergies as of 11/10/2023       Reactions   Amoxicillin    Per pt Rash         Medication List     STOP taking these medications    glimepiride 4 MG tablet Commonly known as: AMARYL   Insulin  Pen Needle 31G X 4 MM Misc   NovoLIN  70/30 Kwikpen (70-30) 100 UNIT/ML KwikPen Generic drug: insulin  isophane & regular human KwikPen   olmesartan -hydrochlorothiazide  40-25 MG tablet Commonly known as: BENICAR  HCT       TAKE these medications    albuterol  108 (90 Base) MCG/ACT inhaler Commonly known as: VENTOLIN  HFA Inhale 2 puffs into the lungs every 4 (four) hours as needed for wheezing or shortness of breath.   atorvastatin  40 MG tablet Commonly known as: LIPITOR Take 1 tablet (40 mg total) by mouth daily.   CENTRUM SILVER 50+WOMEN PO Take 1 tablet by mouth daily.   diltiazem  180 MG 24 hr capsule Commonly known as: CARDIZEM  CD Take 1 capsule (180 mg total) by mouth daily.   diltiazem  30 MG tablet Commonly known as: Cardizem  Take 1 tablet (30 mg total) by mouth 2 (two) times daily. As needed for palpitations   Farxiga  5 MG Tabs tablet Generic drug: dapagliflozin  propanediol Take 5 mg by mouth daily.   flecainide  100 MG tablet Commonly known as: TAMBOCOR  Take 1 tablet (100 mg total) by mouth 2 (two) times daily.   fludrocortisone 0.1 MG tablet Commonly known as: FLORINEF Take 100 mcg by mouth 2 (two) times daily.   Lokelma  10 g Pack packet Generic drug: sodium zirconium cyclosilicate  Take 10 g by mouth 2 (two) times daily.   Mounjaro  12.5 MG/0.5ML Pen Generic drug: tirzepatide  Inject 12.5 mg into the skin once a week. What changed: Another medication with the same name was  removed. Continue taking this medication, and follow the directions you see here.   rivaroxaban  20 MG Tabs tablet Commonly known as: Xarelto  Take 1 tablet (20 mg total) by mouth daily with supper.        Follow-up Information     Osei-Bonsu, Zachary, MD. Schedule an appointment as soon as possible for a visit in 1 week(s).   Specialty: Internal Medicine Contact  information: [address] [facility] [address] [phone]                Subjective   Pt reports feeling well today. Does not remember how she got to the hospital. Able to tolerate diet today. No abdominal pain.  Open to having a continuous glucose monitor.   All questions and concerns were addressed at time of discharge.  Objective  Blood pressure (!) 135/95, pulse 81, temperature 98.5 F (36.9 C), temperature source Oral, resp. rate 18, height 5' 2.5 (1.588 m), weight 91.9 kg, last menstrual period 12/24/2015, SpO2 100%.   General: Pt is alert, awake, not in acute distress Cardiovascular: RRR, S1/S2 +, no rubs, no gallops Respiratory: CTA bilaterally, no wheezing, no rhonchi Abdominal: Soft, NT, ND, bowel sounds + Extremities: no edema, no cyanosis  The results of significant diagnostics from this hospitalization (including imaging, microbiology, ancillary and laboratory) are listed below for reference.   Imaging studies: CT Head Wo Contrast Result Date: 11/09/2023 EXAM: CT HEAD WITHOUT CONTRAST 11/09/2023 02:50:03 PM TECHNIQUE: CT of the head was performed without the administration of intravenous contrast. Automated exposure control, iterative reconstruction, and/or weight based adjustment of the mA/kV was utilized to reduce the radiation dose to as low as reasonably achievable. COMPARISON: CT head dated 10/25/2020. CLINICAL HISTORY: Mental status change, unknown cause. Mental status change; from home for AMS, confusion, speech change, visual changes, HA, altered balance, falling, HA, lower blood sugars, numbness in face. FINDINGS: BRAIN AND VENTRICLES: No acute hemorrhage. Gray-white differentiation is preserved. No hydrocephalus. No extra-axial collection. No mass effect or midline shift. ORBITS: Bilateral lens replacement. Chronic deformity of the left lamina papyracea. SINUSES: Mild mucosal thickening in the ethmoid sinuses. Mild secretions in the left sphenoid sinus.  SOFT TISSUES AND SKULL: No acute soft tissue abnormality. No skull fracture. IMPRESSION: 1. No acute intracranial abnormality. Electronically signed by: Donnice Mania MD 11/09/2023 03:01 PM EDT RP Workstation: [id number]   Labs: Basic Metabolic Panel: Recent Labs  Lab 11/09/23 1300 11/10/23 0524  NA 139 134*  K 4.6 4.5  CL 111 104  CO2 22 20*  GLUCOSE 36* 244*  BUN 34* 29*  CREATININE 1.94* 1.87*  CALCIUM  9.0 8.6*  MG 2.0  --    CBC: Recent Labs  Lab 11/09/23 1300 11/10/23 0524  WBC 8.0 7.4  NEUTROABS 4.8  --   HGB 9.9* 9.6*  HCT 31.9* 31.3*  MCV 87.2 87.4  PLT 296 276   Microbiology: Results for orders placed or performed in visit on 04/27/19  Urine Culture     Status: None   Collection Time: 04/27/19  2:03 PM   Specimen: Urine   UR  Result Value Ref Range Status   Urine Culture, Routine Final report  Final   Organism ID, Bacteria Comment  Final    Comment: Mixed urogenital flora Less than 10,000 colonies/mL     Time coordinating discharge: Over 30 minutes  Marien LITTIE Piety, MD  [facility] 11/10/2023, 3:48 PM

## 2023-11-10 NOTE — Progress Notes (Signed)
   11/10/23 1028  TOC Brief Assessment  Insurance and Status Reviewed  Patient has primary care physician Yes  Home environment has been reviewed apartment  Prior level of function: independent  Prior/Current Home Services No current home services  Social Drivers of Health Review SDOH reviewed no interventions necessary  Readmission risk has been reviewed Yes  Transition of care needs transition of care needs identified, TOC will continue to follow    Signed: Heather Saltness, MSW, LCSW Clinical Social Worker Inpatient Care Management 11/10/2023 10:29 AM

## 2023-11-10 NOTE — Telephone Encounter (Signed)
 Patient Product/process development scientist completed.    The patient is insured through [facility]. Patient has [employer], may use a copay card, and/or apply for patient assistance if available.    Ran test claim for [employer] and Requires Prior Authorization  Ran test claim for [employer] and Product Not Covered  This test claim was processed through [employer]- copay amounts may vary at other pharmacies due to [employer], or as the patient moves through the different stages of their insurance plan.     Reyes Sharps, CPHT Pharmacy Technician III Certified Patient Advocate [facility] Patient Advocate Team Direct Number: [phone]  Fax: [phone]

## 2023-11-15 DIAGNOSIS — D509 Iron deficiency anemia, unspecified: Secondary | ICD-10-CM | POA: Diagnosis not present

## 2023-11-15 DIAGNOSIS — I482 Chronic atrial fibrillation, unspecified: Secondary | ICD-10-CM | POA: Diagnosis not present

## 2023-11-15 DIAGNOSIS — E1122 Type 2 diabetes mellitus with diabetic chronic kidney disease: Secondary | ICD-10-CM | POA: Diagnosis not present

## 2023-11-15 DIAGNOSIS — N1832 Chronic kidney disease, stage 3b: Secondary | ICD-10-CM | POA: Diagnosis not present

## 2023-11-15 DIAGNOSIS — Z794 Long term (current) use of insulin: Secondary | ICD-10-CM | POA: Diagnosis not present

## 2023-11-15 DIAGNOSIS — I10 Essential (primary) hypertension: Secondary | ICD-10-CM | POA: Diagnosis not present

## 2023-11-17 ENCOUNTER — Ambulatory Visit (HOSPITAL_BASED_OUTPATIENT_CLINIC_OR_DEPARTMENT_OTHER): Payer: Self-pay | Admitting: Cardiology

## 2023-11-17 ENCOUNTER — Ambulatory Visit

## 2023-11-29 DIAGNOSIS — N1832 Chronic kidney disease, stage 3b: Secondary | ICD-10-CM | POA: Diagnosis not present

## 2023-11-29 DIAGNOSIS — Z794 Long term (current) use of insulin: Secondary | ICD-10-CM | POA: Diagnosis not present

## 2023-11-29 DIAGNOSIS — E1122 Type 2 diabetes mellitus with diabetic chronic kidney disease: Secondary | ICD-10-CM | POA: Diagnosis not present

## 2023-11-29 DIAGNOSIS — E875 Hyperkalemia: Secondary | ICD-10-CM | POA: Diagnosis not present

## 2023-11-30 DIAGNOSIS — E113512 Type 2 diabetes mellitus with proliferative diabetic retinopathy with macular edema, left eye: Secondary | ICD-10-CM | POA: Diagnosis not present

## 2023-12-10 DIAGNOSIS — E113511 Type 2 diabetes mellitus with proliferative diabetic retinopathy with macular edema, right eye: Secondary | ICD-10-CM | POA: Diagnosis not present

## 2024-01-06 ENCOUNTER — Encounter: Payer: Self-pay | Admitting: Nurse Practitioner

## 2024-01-06 ENCOUNTER — Ambulatory Visit: Attending: Nurse Practitioner | Admitting: Nurse Practitioner

## 2024-01-06 VITALS — BP 132/88 | HR 85 | Ht 62.5 in | Wt 187.0 lb

## 2024-01-06 DIAGNOSIS — E669 Obesity, unspecified: Secondary | ICD-10-CM

## 2024-01-06 DIAGNOSIS — R0609 Other forms of dyspnea: Secondary | ICD-10-CM

## 2024-01-06 DIAGNOSIS — I480 Paroxysmal atrial fibrillation: Secondary | ICD-10-CM

## 2024-01-06 DIAGNOSIS — E7800 Pure hypercholesterolemia, unspecified: Secondary | ICD-10-CM

## 2024-01-06 DIAGNOSIS — R5383 Other fatigue: Secondary | ICD-10-CM

## 2024-01-06 DIAGNOSIS — I10 Essential (primary) hypertension: Secondary | ICD-10-CM

## 2024-01-06 DIAGNOSIS — N183 Chronic kidney disease, stage 3 unspecified: Secondary | ICD-10-CM

## 2024-01-06 DIAGNOSIS — E119 Type 2 diabetes mellitus without complications: Secondary | ICD-10-CM

## 2024-01-06 NOTE — Progress Notes (Signed)
 Office Visit    Patient Name: Dawn Braun Date of Encounter: 01/06/2024  Primary Care Provider:  Catalina Bare, MD Primary Cardiologist:  Shelda Bruckner, MD  Chief Complaint    58 year old female with a history of paroxysmal atrial fibrillation, dyspnea on exertion, hypertension, type 2 diabetes, CKD stage III with history of AKI, SBO s/p surgery, asthma, and obesity who presents for follow-up related to atrial fibrillation and hypertension.   Past Medical History    Past Medical History:  Diagnosis Date   Asthma    Chronic kidney disease (CKD) stage G3a/A3, moderately decreased glomerular filtration rate (GFR) between 45-59 mL/min/1.73 square meter and albuminuria creatinine ratio greater than 300 mg/g (HCC)    Complication of anesthesia    takes me awhile to wakeup (05/17/2017)   Hypertension    Intractable nausea and vomiting    Microalbuminuria due to type 2 diabetes mellitus (HCC)    Paroxysmal atrial fibrillation (HCC)    Small bowel obstruction (HCC) 03/26/2019   Type II diabetes mellitus (HCC)    Past Surgical History:  Procedure Laterality Date   CESAREAN SECTION  2003   CHOLECYSTECTOMY N/A 12/23/2016   Procedure: LAPAROSCOPIC CHOLECYSTECTOMY;  Surgeon: Stevie Herlene Righter, MD;  Location: [facility];  Service: General;  Laterality: N/A;   COLONOSCOPY  05/02/2020   CG, MD   EYE SURGERY Right ~ 2016   bleeding issues; lasered; cauterized leaks   KNEE ARTHROSCOPY Right X 2   LAPAROTOMY N/A 03/26/2019   Procedure: EXPLORATORY LAPAROTOMY;  Surgeon: Eletha Boas, MD;  Location: [facility];  Service: General;  Laterality: N/A;   LYSIS OF ADHESION N/A 03/26/2019   Procedure: LYSIS OF ADHESION;  Surgeon: Eletha Boas, MD;  Location: [facility];  Service: General;  Laterality: N/A;   SPLIT NIGHT STUDY  08/06/2015   TUBAL LIGATION  2003   UPPER GASTROINTESTINAL ENDOSCOPY  05/02/2020   CG, MD    Allergies  Allergies  Allergen Reactions   Amoxicillin      Per pt Rash     Labs/Other Studies Reviewed    The following studies were reviewed today:  Cardiac Studies & Procedures   ______________________________________________________________________________________________   STRESS TESTS  MYOCARDIAL PERFUSION IMAGING 11/04/2022  Interpretation Summary   The study is normal. The study is low risk.   No ST deviation was noted.   LV perfusion is normal. There is no evidence of ischemia. There is no evidence of infarction.   Left ventricular function is normal. Nuclear stress EF: 62%. The left ventricular ejection fraction is normal (55-65%). End diastolic cavity size is normal. End systolic cavity size is normal.   ECHOCARDIOGRAM  ECHOCARDIOGRAM COMPLETE 10/12/2022  Narrative ECHOCARDIOGRAM REPORT    Patient Name:   Dawn Braun Date of Exam: 10/12/2022 Medical Rec #:  [id number]           Height:       62.0 in Accession #:    [id number]          Weight:       236.6 lb Date of Birth:  08-Feb-1966           BSA:          2.053 m Patient Age:    57 years            BP:           130/90 mmHg Patient Gender: F  HR:           69 bpm. Exam Location:  [address]  Procedure: 2D Echo, Cardiac Doppler and Color Doppler  Indications:    R00.2 Palpitations  History:        Patient has prior history of Echocardiogram examinations, most recent 12/16/2017. Palpitations, Arrythmias:Atrial Fibrillation; Risk Factors:Hypertension and Diabetes.  Sonographer:    Elsie Bohr RDCS Referring Phys: 22 TESSA N CONTE  IMPRESSIONS   1. Moderate septal hypertrophy with otherwise mild concentric LVH. Left ventricular ejection fraction, by estimation, is 70 to 75%. The left ventricle has hyperdynamic function. The left ventricle has no regional wall motion abnormalities. There is moderate asymmetric left ventricular hypertrophy. Left ventricular diastolic parameters are consistent with Grade I diastolic dysfunction (impaired  relaxation). Elevated left ventricular end-diastolic pressure. 2. Right ventricular systolic function is normal. The right ventricular size is normal. There is normal pulmonary artery systolic pressure. 3. Left atrial size was mildly dilated. 4. The mitral valve is normal in structure. Trivial mitral valve regurgitation. No evidence of mitral stenosis. 5. The aortic valve is tricuspid. Aortic valve regurgitation is not visualized. No aortic stenosis is present. 6. The inferior vena cava is normal in size with greater than 50% respiratory variability, suggesting right atrial pressure of 3 mmHg.  FINDINGS Left Ventricle: Moderate septal hypertrophy with otherwise mild concentric LVH. Left ventricular ejection fraction, by estimation, is 70 to 75%. The left ventricle has hyperdynamic function. The left ventricle has no regional wall motion abnormalities. The left ventricular internal cavity size was normal in size. There is moderate asymmetric left ventricular hypertrophy. Left ventricular diastolic parameters are consistent with Grade I diastolic dysfunction (impaired relaxation). Elevated left ventricular end-diastolic pressure.  Right Ventricle: The right ventricular size is normal. No increase in right ventricular wall thickness. Right ventricular systolic function is normal. There is normal pulmonary artery systolic pressure. The tricuspid regurgitant velocity is 2.49 m/s, and with an assumed right atrial pressure of 3 mmHg, the estimated right ventricular systolic pressure is 27.8 mmHg.  Left Atrium: Left atrial size was mildly dilated.  Right Atrium: Right atrial size was normal in size.  Pericardium: There is no evidence of pericardial effusion.  Mitral Valve: The mitral valve is normal in structure. Trivial mitral valve regurgitation. No evidence of mitral valve stenosis.  Tricuspid Valve: The tricuspid valve is normal in structure. Tricuspid valve regurgitation is trivial. No evidence  of tricuspid stenosis.  Aortic Valve: The aortic valve is tricuspid. Aortic valve regurgitation is not visualized. No aortic stenosis is present.  Pulmonic Valve: The pulmonic valve was normal in structure. Pulmonic valve regurgitation is not visualized. No evidence of pulmonic stenosis.  Aorta: The aortic root is normal in size and structure.  Venous: The inferior vena cava is normal in size with greater than 50% respiratory variability, suggesting right atrial pressure of 3 mmHg.  IAS/Shunts: No atrial level shunt detected by color flow Doppler.   LEFT VENTRICLE PLAX 2D LVIDd:         4.40 cm   Diastology LVIDs:         2.60 cm   LV e' medial:    5.33 cm/s LV PW:         1.20 cm   LV E/e' medial:  14.4 LV IVS:        1.40 cm   LV e' lateral:   8.59 cm/s LVOT diam:     2.00 cm   LV E/e' lateral: 8.9 LV SV:  63 LV SV Index:   31 LVOT Area:     3.14 cm   RIGHT VENTRICLE             IVC RV S prime:     17.40 cm/s  IVC diam: 1.30 cm TAPSE (M-mode): 2.5 cm RVSP:           27.8 mmHg  LEFT ATRIUM             Index        RIGHT ATRIUM           Index LA diam:        3.30 cm 1.61 cm/m   RA Pressure: 3.00 mmHg LA Vol (A2C):   60.2 ml 29.32 ml/m  RA Area:     14.40 cm LA Vol (A4C):   63.4 ml 30.88 ml/m  RA Volume:   32.40 ml  15.78 ml/m LA Biplane Vol: 64.9 ml 31.61 ml/m AORTIC VALVE LVOT Vmax:   87.80 cm/s LVOT Vmean:  58.500 cm/s LVOT VTI:    0.200 m  AORTA Ao Root diam: 2.90 cm Ao Asc diam:  3.10 cm  MITRAL VALVE               TRICUSPID VALVE MV Area (PHT): 3.70 cm    TR Peak grad:   24.8 mmHg MV Decel Time: 205 msec    TR Vmax:        249.00 cm/s MV E velocity: 76.50 cm/s  Estimated RAP:  3.00 mmHg MV A velocity: 95.90 cm/s  RVSP:           27.8 mmHg MV E/A ratio:  0.80 SHUNTS Systemic VTI:  0.20 m Systemic Diam: 2.00 cm  Annabella Scarce MD Electronically signed by Annabella Scarce MD Signature Date/Time: 10/12/2022/4:01:17 PM    Final     MONITORS  LONG TERM MONITOR (3-14 DAYS) 10/09/2022  Narrative Patch Wear Time:  11 days and 12 hours (2024-06-21T10:01:26-0400 to 2024-07-02T22:48:55-0400)  Patient had a min HR of 47 bpm, max HR of 144 bpm, and avg HR of 85 bpm. Predominant underlying rhythm was Sinus Rhythm. 1 run of Supraventricular Tachycardia occurred lasting 9 beats with a max rate of 105 bpm (avg 97 bpm). Second Degree AV Block-Mobitz  I (Wenckebach) was present. No VT, atrial fibrillation, high degree block, or pauses noted. Isolated atrial and ventricular ectopy was rare (<1%). There were 4 triggered events, which were sinus rhythm. No high risk arrhythmias detected.       ______________________________________________________________________________________________     Recent Labs: 11/09/2023: ALT 16; Magnesium 2.0; TSH 0.574 11/10/2023: BUN 29; Creatinine, Ser 1.87; Hemoglobin 9.6; Platelets 276; Potassium 4.5; Sodium 134  Recent Lipid Panel    Component Value Date/Time   CHOL 111 09/29/2023 0736   TRIG 67 09/29/2023 0736   HDL 42 09/29/2023 0736   CHOLHDL 2.6 09/29/2023 0736   LDLCALC 55 09/29/2023 0736    History of Present Illness    58 year old female with the above past medical history including paroxysmal atrial fibrillation, dyspnea on exertion, hypertension, type 2 diabetes, CKD stage III with history of AKI, SBO s/p surgery, asthma, and obesity.   She has a history of paroxysmal atrial fibrillation, followed by the A-fib clinic. She is on flecainide , diltiazem , Xarelto .  She has chronic issues with lower extremity edema, lightheadedness, fatigue.  Cardiac monitor in 10/2022 showed predominantly sinus rhythm, 1 run of SVT lasting 9 beats, second-degree AV block Mobitz type I, no other significant arrhythmia.  Echocardiogram in  10/2022 showed EF 70 to 75%, hyperdynamic LV function, no RWMA, moderate asymmetric LVH, G1 DD, normal RV systolic function, no significant valvular abnormalities.  Lexiscan   Myoview  in 10/2022 was negative for ischemia, EF 55 to 65%.  CPX was previously ordered but not completed.  She was last seen in the office on 10/20/2023 and reported significant fatigue, shortness of breath with minimal exertion, bilateral shoulder pain.  Olmesartan -HCTZ was held in the setting of hypotension.  She was hospitalized in 11/2023 in the setting of hypoglycemia.  Insulin  and glimepiride were discontinued.  She presents today for follow-up.  Since her last visit she has been stable from a cardiac standpoint. She reports 1 episode of palpitations that occurred last Sunday while getting ready for church.  Her heart rate was elevated to 140s bpm, she felt winded at the time.  Symptoms lasted for less than 30 minutes and resolved spontaneously.  She denies any further palpitations. She continues to lose weight on Mounjaro .  Overall, she reports feeling well.  Home Medications    Current Outpatient Medications  Medication Sig Dispense Refill   albuterol  (VENTOLIN  HFA) 108 (90 Base) MCG/ACT inhaler Inhale 2 puffs into the lungs every 4 (four) hours as needed for wheezing or shortness of breath. 1 each 0   atorvastatin  (LIPITOR) 40 MG tablet Take 1 tablet (40 mg total) by mouth daily. 90 tablet 3   diltiazem  (CARDIZEM  CD) 180 MG 24 hr capsule Take 1 capsule (180 mg total) by mouth daily. 90 capsule 3   diltiazem  (CARDIZEM ) 30 MG tablet Take 1 tablet (30 mg total) by mouth 2 (two) times daily. As needed for palpitations 180 tablet 3   FARXIGA  5 MG TABS tablet Take 5 mg by mouth daily.     flecainide  (TAMBOCOR ) 100 MG tablet Take 1 tablet (100 mg total) by mouth 2 (two) times daily. 180 tablet 3   fludrocortisone (FLORINEF) 0.1 MG tablet Take 100 mcg by mouth 2 (two) times daily.     Multiple Vitamins-Minerals (CENTRUM SILVER 50+WOMEN PO) Take 1 tablet by mouth daily.     rivaroxaban  (XARELTO ) 20 MG TABS tablet Take 1 tablet (20 mg total) by mouth daily with supper. 90 tablet 3   sodium zirconium  cyclosilicate (LOKELMA ) 10 g PACK packet Take 10 g by mouth 2 (two) times daily. 180 packet 0   tirzepatide  (MOUNJARO ) 12.5 MG/0.5ML Pen Inject 12.5 mg into the skin once a week. 2 mL 0   No current facility-administered medications for this visit.     Review of Systems    She denies chest pain, palpitations, dyspnea, pnd, orthopnea, n, v, dizziness, syncope, edema, weight gain, or early satiety. All other systems reviewed and are otherwise negative except as noted above.   Physical Exam    VS:  BP 132/88   Pulse 85   Ht 5' 2.5 (1.588 m)   Wt 187 lb (84.8 kg)   LMP 12/24/2015 (Exact Date)   SpO2 99%   BMI 33.66 kg/m   GEN: Well nourished, well developed, in no acute distress. HEENT: normal. Neck: Supple, no JVD, carotid bruits, or masses. Cardiac: RRR, no murmurs, rubs, or gallops. No clubbing, cyanosis, edema.  Radials/DP/PT 2+ and equal bilaterally.  Respiratory:  Respirations regular and unlabored, clear to auscultation bilaterally. GI: Soft, nontender, nondistended, BS + x 4. MS: no deformity or atrophy. Skin: warm and dry, no rash. Neuro:  Strength and sensation are intact. Psych: Normal affect.  Accessory Clinical Findings    ECG  personally reviewed by me today -    - no EKG in office today.  EKG from 8/25 showed sinus rhythm, 68 bpm, stable QT.     Lab Results  Component Value Date   WBC 7.4 11/10/2023   HGB 9.6 (L) 11/10/2023   HCT 31.3 (L) 11/10/2023   MCV 87.4 11/10/2023   PLT 276 11/10/2023   Lab Results  Component Value Date   CREATININE 1.87 (H) 11/10/2023   BUN 29 (H) 11/10/2023   NA 134 (L) 11/10/2023   K 4.5 11/10/2023   CL 104 11/10/2023   CO2 20 (L) 11/10/2023   Lab Results  Component Value Date   ALT 16 11/09/2023   AST 23 11/09/2023   ALKPHOS 99 11/09/2023   BILITOT 0.4 11/09/2023   Lab Results  Component Value Date   CHOL 111 09/29/2023   HDL 42 09/29/2023   LDLCALC 55 09/29/2023   TRIG 67 09/29/2023   CHOLHDL 2.6 09/29/2023     Lab Results  Component Value Date   HGBA1C 5.9 (H) 10/21/2023    Assessment & Plan    1. Dyspnea on exertion: Echocardiogram in 10/2022 showed EF 70 to 75%, hyperdynamic LV function, no RWMA, moderate asymmetric LVH, G1 DD, normal RV systolic function, no significant valvular abnormalities.  Lexiscan  Myoview  in 10/2022 was negative for ischemia, EF 55 to 65%. CPX was ordered due to ongoing symptoms, but not completed.  She denies any recent symptoms, declines any additional testing today.  Euvolemic and well compensated on exam. Workup to date overall reassuring.   2. Paroxysmal atrial fibrillation: Maintaining NSR on exam.  She reports 1 episode of palpitations that occurred last Sunday while she was getting ready for church.  Heart rate was elevated 140s bpm.  She felt winded at the time.  Symptoms resolved within 30 minutes.  She denies any further palpitations.  Most recent CBC, CMET, TSH, magnesium were stable.  She denies bleeding.  Will repeat CBC, BMET today.  Continue to monitor symptoms.  Reviewed ED precautions.  Continue flecainide , diltiazem , Xarelto .   3. Hypertension: BP slightly elevated in office today, has been well-controlled at home.  Continue to monitor. Continue current antihypertensive regimen.    4. Hyperlipidemia: LDL was 55 in 09/2023. Continue Lipitor.   5. Type 2 diabetes/obesity: A1c was 5.9 in 10/2023. She has lost weight since transitioning from Ozempic  to Mounjaro .  Encouraged ongoing lifestyle modification with diet and exercise.  Managed per PCP.   6. CKD stage III/hyperkalemia: She has a history of chronic hyperkalemia, on daily Lokelma . Creatinine was stable at 1.87 in 11/2023, potassium was 4.5. Followed by nephrology.     7. Disposition: Follow-up in 3 to 4 months with Dr. Lonni.      Damien JAYSON Braver, NP 01/06/2024, 9:58 AM

## 2024-01-06 NOTE — Patient Instructions (Addendum)
 Medication Instructions:  Your physician recommends that you continue on your current medications as directed. Please refer to the Current Medication list given to you today.  *If you need a refill on your cardiac medications before your next appointment, please call your pharmacy*  Lab Work: CBC, BMET today  Testing/Procedures: NONE ordered at this time of appointment   Follow-Up: At [facility], you and your health needs are our priority.  As part of our continuing mission to provide you with exceptional heart care, our providers are all part of one team.  This team includes your primary Cardiologist (physician) and Advanced Practice Providers or APPs (Physician Assistants and Nurse Practitioners) who all work together to provide you with the care you need, when you need it.  Your next appointment:   3-4 month(s)  Provider:   Shelda Bruckner, MD    We recommend signing up for the patient portal called MyChart.  Sign up information is provided on this After Visit Summary.  MyChart is used to connect with patients for Virtual Visits (Telemedicine).  Patients are able to view lab/test results, encounter notes, upcoming appointments, etc.  Non-urgent messages can be sent to your provider as well.   To learn more about what you can do with MyChart, go to [email or web address].

## 2024-01-10 ENCOUNTER — Encounter: Payer: Self-pay | Admitting: Nurse Practitioner

## 2024-01-18 ENCOUNTER — Ambulatory Visit: Payer: Self-pay | Admitting: Nurse Practitioner

## 2024-01-18 LAB — BASIC METABOLIC PANEL WITH GFR
BUN/Creatinine Ratio: 18 (ref 9–23)
BUN: 40 mg/dL — ABNORMAL HIGH (ref 6–24)
CO2: 17 mmol/L — ABNORMAL LOW (ref 20–29)
Calcium: 9.8 mg/dL (ref 8.7–10.2)
Chloride: 108 mmol/L — ABNORMAL HIGH (ref 96–106)
Creatinine, Ser: 2.18 mg/dL — ABNORMAL HIGH (ref 0.57–1.00)
Glucose: 114 mg/dL — ABNORMAL HIGH (ref 70–99)
Potassium: 4.9 mmol/L (ref 3.5–5.2)
Sodium: 140 mmol/L (ref 134–144)
eGFR: 25 mL/min/1.73 — ABNORMAL LOW (ref >59)

## 2024-01-18 LAB — CBC
Hematocrit: 36.3 % (ref 34.0–46.6)
Hemoglobin: 11.6 g/dL (ref 11.1–15.9)
MCH: 27.8 pg (ref 26.6–33.0)
MCHC: 32 g/dL (ref 31.5–35.7)
MCV: 87 fL (ref 79–97)
Platelets: 321 x10E3/uL (ref 150–450)
RBC: 4.18 x10E6/uL (ref 3.77–5.28)
RDW: 14.9 % (ref 11.7–15.4)
WBC: 6.4 x10E3/uL (ref 3.4–10.8)

## 2024-01-19 DIAGNOSIS — N1832 Chronic kidney disease, stage 3b: Secondary | ICD-10-CM | POA: Diagnosis not present

## 2024-01-27 ENCOUNTER — Telehealth: Payer: Self-pay | Admitting: Pharmacy Technician

## 2024-01-27 ENCOUNTER — Encounter (HOSPITAL_COMMUNITY): Payer: Self-pay | Admitting: Emergency Medicine

## 2024-01-27 ENCOUNTER — Other Ambulatory Visit (HOSPITAL_COMMUNITY): Payer: Self-pay

## 2024-01-27 ENCOUNTER — Ambulatory Visit (HOSPITAL_COMMUNITY)
Admission: EM | Admit: 2024-01-27 | Discharge: 2024-01-27 | Disposition: A | Discharge status: Home / Self Care | Attending: Physician Assistant | Admitting: Physician Assistant

## 2024-01-27 DIAGNOSIS — B86 Scabies: Secondary | ICD-10-CM | POA: Diagnosis not present

## 2024-01-27 MED ORDER — TRIAMCINOLONE ACETONIDE 0.1 % EX CREA: 1.0000 | TOPICAL_CREAM | Freq: Two times a day (BID) | CUTANEOUS | 0 refills | Status: AC

## 2024-01-27 MED ORDER — PERMETHRIN 5 % EX CREA: TOPICAL_CREAM | CUTANEOUS | 0 refills | Status: AC

## 2024-01-27 NOTE — Telephone Encounter (Signed)
 Pharmacy Patient Advocate Encounter   Received notification from CoverMyMeds that prior authorization for Permethrin 5% cream is required/requested.   Insurance verification completed.   The patient is insured through [employer].     Per test claim: The current 15 day co-pay is, $10.00.  No PA needed at this time. This test claim was processed through [facility]- copay amounts may vary at other pharmacies due to pharmacy/plan contracts, or as the patient moves through the different stages of their insurance plan.

## 2024-01-27 NOTE — ED Triage Notes (Signed)
 Pt states she has Tried Benadryl , calamine, alcohol, itch cream, cortisone and nothing helping the itch on bilateral feet and lower legs and nothing helping for a week.

## 2024-01-27 NOTE — ED Provider Notes (Signed)
 [facility]    CSN: [id number] Arrival date & time: 01/27/24  1324      History   Chief Complaint Chief Complaint  Patient presents with   Pruritis    HPI Dawn Braun is a 58 y.o. female.   HPI   Pt is here today with concerns of itching along the lower legs and feet bilaterally She states she has extreme itching and wearing her socks and shoes is very uncomfortable She has tried multiple OTC products to help= benadryl  cream, hydrocortisone cream, anti-itch cream, calamine lotion but nothing has helped  She states this has been ongoing for about a week She denies new lotions, soaps, detergents, medications, recent travel She denies similar symptoms or rash in other members of household She reports that the itching becomes more severe at nighttime and she has been scratching so much that she is causing gouges in her skin   Past Medical History:  Diagnosis Date   Asthma    Chronic kidney disease (CKD) stage G3a/A3, moderately decreased glomerular filtration rate (GFR) between 45-59 mL/min/1.73 square meter and albuminuria creatinine ratio greater than 300 mg/g (HCC)    Complication of anesthesia    takes me awhile to wakeup (05/17/2017)   Hypertension    Intractable nausea and vomiting    Microalbuminuria due to type 2 diabetes mellitus (HCC)    Paroxysmal atrial fibrillation (HCC)    Small bowel obstruction (HCC) 03/26/2019   Type II diabetes mellitus (HCC)     Patient Active Problem List   Diagnosis Date Noted   Hypoglycemia 11/09/2023   Hyperkalemia 09/26/2022   Primary osteoarthritis of left knee 05/23/2021   Morbid obesity (HCC) 05/23/2021   Obesity, Class III, BMI 40-49.9 (morbid obesity) (HCC) 05/23/2021   Sleep disorder 11/01/2020   Generalized edema 11/01/2020   Secondary hypercoagulable state 04/01/2020   Hypercholesterolemia 04/01/2020   Essential hypertension 04/01/2020   Chronic kidney disease (CKD) stage G3a/A3, moderately  decreased glomerular filtration rate (GFR) between 45-59 mL/min/1.73 square meter and albuminuria creatinine ratio greater than 300 mg/g (HCC)    Women's annual routine gynecological examination 03/04/2020   CKD (chronic kidney disease) stage 3, GFR 30-59 ml/min (HCC) 03/28/2019   Obesity (BMI 30-39.9) 03/28/2019   Chronic anticoagulation 03/28/2019   Closed loop SBO (small bowel obstruction) s/p exlap /lysis of adhesions 03/26/2019 03/25/2019   Acute renal failure superimposed on stage 3 chronic kidney disease (HCC) 09/19/2018   DKA (diabetic ketoacidosis) (HCC) 09/18/2018   Leukocytosis 05/17/2017   Hypokalemia 01/02/2017   SIRS (systemic inflammatory response syndrome) (HCC) 12/31/2016   Hemoperitoneum 12/31/2016   Intractable nausea and vomiting 12/31/2016   AKI (acute kidney injury) 12/31/2016   Paroxysmal atrial fibrillation (HCC)    Asthma    Type 2 diabetes mellitus in patient with obesity (HCC) 12/23/2016   Menopausal symptom 05/01/2016    Past Surgical History:  Procedure Laterality Date   CESAREAN SECTION  2003   CHOLECYSTECTOMY N/A 12/23/2016   Procedure: LAPAROSCOPIC CHOLECYSTECTOMY;  Surgeon: Stevie Herlene Righter, MD;  Location: [facility];  Service: General;  Laterality: N/A;   COLONOSCOPY  05/02/2020   CG, MD   EYE SURGERY Right ~ 2016   bleeding issues; lasered; cauterized leaks   KNEE ARTHROSCOPY Right X 2   LAPAROTOMY N/A 03/26/2019   Procedure: EXPLORATORY LAPAROTOMY;  Surgeon: Eletha Boas, MD;  Location: [facility];  Service: General;  Laterality: N/A;   LYSIS OF ADHESION N/A 03/26/2019   Procedure: LYSIS OF ADHESION;  Surgeon: Eletha,  Krystal, MD;  Location: [facility];  Service: General;  Laterality: N/A;   SPLIT NIGHT STUDY  08/06/2015   TUBAL LIGATION  2003   UPPER GASTROINTESTINAL ENDOSCOPY  05/02/2020   CG, MD    OB History     Gravida      Para      Term      Preterm      AB      Living  2      SAB      IAB      Ectopic      Multiple       Live Births               Home Medications    Prior to Admission medications   Medication Sig Start Date End Date Taking? Authorizing Provider  permethrin (ELIMITE) 5 % cream Apply to affected area once 01/27/24  Yes Merlin Golden E, PA-C  triamcinolone  cream (KENALOG) 0.1 % Apply 1 Application topically 2 (two) times daily. 01/27/24  Yes Milady Fleener E, PA-C  albuterol  (VENTOLIN  HFA) 108 (90 Base) MCG/ACT inhaler Inhale 2 puffs into the lungs every 4 (four) hours as needed for wheezing or shortness of breath. 08/12/23   Piontek, Rocky, MD  atorvastatin  (LIPITOR) 40 MG tablet Take 1 tablet (40 mg total) by mouth daily. 11/03/22   Lucien Orren SAILOR, PA-C  diltiazem  (CARDIZEM  CD) 180 MG 24 hr capsule Take 1 capsule (180 mg total) by mouth daily. 12/28/22   Lucien Orren SAILOR, PA-C  diltiazem  (CARDIZEM ) 30 MG tablet Take 1 tablet (30 mg total) by mouth 2 (two) times daily. As needed for palpitations 12/28/22   Lucien Orren SAILOR, PA-C  FARXIGA  5 MG TABS tablet Take 5 mg by mouth daily. 05/15/23   [provider]  flecainide  (TAMBOCOR ) 100 MG tablet Take 1 tablet (100 mg total) by mouth 2 (two) times daily. 09/27/23   Lonni Slain, MD  fludrocortisone (FLORINEF) 0.1 MG tablet Take 100 mcg by mouth 2 (two) times daily. 05/13/23   [provider]  Multiple Vitamins-Minerals (CENTRUM SILVER 50+WOMEN PO) Take 1 tablet by mouth daily.    [provider]  rivaroxaban  (XARELTO ) 20 MG TABS tablet Take 1 tablet (20 mg total) by mouth daily with supper. 11/03/22   Lucien Orren SAILOR, PA-C  sodium zirconium cyclosilicate  (LOKELMA ) 10 g PACK packet Take 10 g by mouth 2 (two) times daily. 11/11/22   Okey Vina GAILS, MD  tirzepatide  (MOUNJARO ) 12.5 MG/0.5ML Pen Inject 12.5 mg into the skin once a week. 09/10/23   Lonni Slain, MD    Family History Family History  Problem Relation Age of Onset   Epilepsy Father    Diabetes Mother    Kidney disease Mother    Hypertension Mother    Kidney  disease Brother    Diabetic kidney disease Brother    Diabetes Brother    Colon cancer Neg Hx    Pancreatic cancer Neg Hx    Esophageal cancer Neg Hx    Rectal cancer Neg Hx    Stomach cancer Neg Hx    Liver cancer Neg Hx     Social History Social History   Tobacco Use   Smoking status: Never   Smokeless tobacco: Never  Vaping Use   Vaping status: Never Used  Substance Use Topics   Alcohol use: Yes    Alcohol/week: 1.0 standard drink of alcohol    Types: 1 Glasses of wine per week  Comment: 05/17/2017 glass of wine q other weekend   Drug use: No     Allergies   Amoxicillin   Review of Systems Review of Systems  Skin:  Positive for rash.       Itching from lower legs down to dorsum of feet.      Physical Exam Triage Vital Signs ED Triage Vitals [01/27/24 1342]  Encounter Vitals Group     BP (!) 155/79     Girls Systolic BP Percentile      Girls Diastolic BP Percentile      Boys Systolic BP Percentile      Boys Diastolic BP Percentile      Pulse Rate 80     Resp 17     Temp 98.2 F (36.8 C)     Temp Source Oral     SpO2 98 %     Weight      Height      Head Circumference      Peak Flow      Pain Score 0     Pain Loc      Pain Education      Exclude from Growth Chart    No data found.  Updated Vital Signs BP (!) 155/79 (BP Location: Right Arm)   Pulse 80   Temp 98.2 F (36.8 C) (Oral)   Resp 17   LMP 12/24/2015 (Exact Date)   SpO2 98%   Visual Acuity Right Eye Distance:   Left Eye Distance:   Bilateral Distance:    Right Eye Near:   Left Eye Near:    Bilateral Near:     Physical Exam Vitals reviewed.  Constitutional:      General: She is awake.     Appearance: Normal appearance. She is well-developed and well-groomed.  HENT:     Head: Normocephalic and atraumatic.  Eyes:     General: Lids are normal. Gaze aligned appropriately.     Extraocular Movements: Extraocular movements intact.     Conjunctiva/sclera: Conjunctivae  normal.  Pulmonary:     Effort: Pulmonary effort is normal.  Skin:    Findings: Rash present.     Comments: Pt has erythematous maculopapular lesions from the lower half of the calves to the dorsum of the feet with evidence of tunneling   Neurological:     Mental Status: She is alert and oriented to person, place, and time.  Psychiatric:        Attention and Perception: Attention and perception normal.        Mood and Affect: Mood and affect normal.        Speech: Speech normal.        Behavior: Behavior normal. Behavior is cooperative.      UC Treatments / Results  Labs (all labs ordered are listed, but only abnormal results are displayed) Labs Reviewed - No data to display  EKG   Radiology No results found.  Procedures Procedures (including critical care time)  Medications Ordered in UC Medications - No data to display  Initial Impression / Assessment and Plan / UC Course  I have reviewed the triage vital signs and the nursing notes.  Pertinent labs & imaging results that were available during my care of the patient were reviewed by me and considered in my medical decision making (see chart for details).      Final Clinical Impressions(s) / UC Diagnoses   Final diagnoses:  Scabies  Pt presents today with concerns of intense pruritus  and rash along the lower aspects of her calves with radiation to the dorsum of her feet bilaterally. She states this has been ongoing for a week and is not improving with OTC medications. Physical exam is notable for mildly erythematous rash along the lower half of the calves with extension to the dorsum of the foot. She has several areas of superficial excoriations as well. The rash appears to have evidence of tunneling and with her symptom presentation, I suspect she likely has scabies. Will send in permethrin cream to assist with management along with Kenalog cream to help with itching. Reviewed home measures to help with environmental  controls. Follow up as needed for persistent or progressing symptoms     Discharge Instructions      You were seen today for concerns of a rash that is very itchy.  At this time I believe you have something called scabies. To help with the itching I recommend taking a second-generation antihistamine such as Claritin, or Zyrtec  per your preference. To help treat the scabies I have sent you in a prescription for a medication called permethrin cream.  Please apply this from the neck  to soles of feet  at bedtime and leave it on overnight. Please make an attempt to get the cream under your fingernails and toenails as well.   You then wash it off in the morning.  Usually a single treatment is sufficient to treat scabies but if needed you can reapply it in one to two weeks for a second treatment.  Any clothing or linens that you have worn in the past week should be washed and dried on the hottest setting that they can tolerate or isolated for a week and a tightly sealed bag. If you feel like your symptoms are not improving or seem to be worsening you can always return here or follow-up with your PCP for further evaluation and ongoing management.      ED Prescriptions     Medication Sig Dispense Auth. Provider   permethrin (ELIMITE) 5 % cream Apply to affected area once 60 g Curstin Schmale E, PA-C   triamcinolone  cream (KENALOG) 0.1 % Apply 1 Application topically 2 (two) times daily. 30 g Kaily Wragg E, PA-C      PDMP not reviewed this encounter.   Marylene Rocky BRAVO, PA-C 01/27/24 1601

## 2024-01-27 NOTE — Discharge Instructions (Addendum)
 You were seen today for concerns of a rash that is very itchy.  At this time I believe you have something called scabies. To help with the itching I recommend taking a second-generation antihistamine such as Claritin , or Zyrtec  per your preference. To help treat the scabies I have sent you in a prescription for a medication called permethrin  cream.  Please apply this from the neck  to soles of feet  at bedtime and leave it on overnight. Please make an attempt to get the cream under your fingernails and toenails as well.   You then wash it off in the morning.  Usually a single treatment is sufficient to treat scabies but if needed you can reapply it in one to two weeks for a second treatment.  Any clothing or linens that you have worn in the past week should be washed and dried on the hottest setting that they can tolerate or isolated for a week and a tightly sealed bag. If you feel like your symptoms are not improving or seem to be worsening you can always return here or follow-up with your PCP for further evaluation and ongoing management.

## 2024-03-21 ENCOUNTER — Encounter (HOSPITAL_BASED_OUTPATIENT_CLINIC_OR_DEPARTMENT_OTHER): Payer: Self-pay | Admitting: Cardiology

## 2024-03-21 ENCOUNTER — Ambulatory Visit (HOSPITAL_BASED_OUTPATIENT_CLINIC_OR_DEPARTMENT_OTHER): Admitting: Cardiology

## 2024-03-21 VITALS — BP 144/82 | HR 77 | Ht 62.5 in | Wt 175.2 lb

## 2024-03-21 DIAGNOSIS — I480 Paroxysmal atrial fibrillation: Secondary | ICD-10-CM | POA: Diagnosis not present

## 2024-03-21 DIAGNOSIS — Z7901 Long term (current) use of anticoagulants: Secondary | ICD-10-CM | POA: Diagnosis not present

## 2024-03-21 DIAGNOSIS — D6869 Other thrombophilia: Secondary | ICD-10-CM | POA: Diagnosis not present

## 2024-03-21 DIAGNOSIS — E669 Obesity, unspecified: Secondary | ICD-10-CM

## 2024-03-21 DIAGNOSIS — I10 Essential (primary) hypertension: Secondary | ICD-10-CM

## 2024-03-21 DIAGNOSIS — N1832 Chronic kidney disease, stage 3b: Secondary | ICD-10-CM

## 2024-03-21 DIAGNOSIS — Z8639 Personal history of other endocrine, nutritional and metabolic disease: Secondary | ICD-10-CM

## 2024-03-21 MED ORDER — LOKELMA 10 G PO PACK: 10.0000 g | PACK | Freq: Two times a day (BID) | ORAL | Status: AC | PRN

## 2024-03-21 NOTE — Patient Instructions (Signed)
 Medication Instructions:  No changes *If you need a refill on your cardiac medications before your next appointment, please call your pharmacy*  Lab Work: none  Testing/Procedures: none  Follow-Up: 6 months with Dr. Lonni

## 2024-03-21 NOTE — Progress Notes (Signed)
 Cardiology Office Note:  .   Date:  03/21/2024  ID:  Dawn Braun, DOB 27-Jun-1965, MRN [id number] PCP: Catalina Bare, MD  [facility] Providers Cardiologist:  Shelda Bruckner, MD {  History of Present Illness: Dawn   TRYPHENA Braun is a 58 y.o. female with a hx of labile hypertension, prior SBO s/p surgery, type 2 diabetes, paroxysmal atrial fibrillation, asthma, stage 3 CKD with history of AKI, obesity who is seen for follow up today. I initially saw her 05/01/19 as a new consult at the request of Tanda Bleacher, MD for the evaluation and management of paroxysmal atrial fibrillation.   Pertinent CV history: Admitted 09/2022 for hyperkalemia, ARB stopped. Presented with hypertensive urgency. Has chronic issues with LE edema, lightheadedness, fatigue. Monitor 10/2022 unremarkable. Echo 10/2022 EF 70-75%, mild LVH, G1DD, normal RV, RAP 3, no significant valve disease. Stress test 10/2022 without ischemia.  Pertinent general history: Had ER visit 04/16/23 for hyperkalemia. Treated with lokelma  BID for months and florinef twice a day. She is on no medications that would raise potassium. Dr. Norine following closely.  Today: Doing well. Has lost weight, was 202 lbs at our last visit 10/20/23, today 175 lbs. Peak weight about 236 lbs, down 60 lbs. More energy, breathing is better. Able to do more before she gets winded. Tolerating mounjaro  well.   Has not felt any afib. Has lokelma  PRN. Follows with Dr. Norine at [facility].   ROS: Denies chest pain, shortness of breath at rest or with normal exertion. No PND, orthopnea, LE edema or unexpected weight gain. No syncope or palpitations. ROS otherwise negative except as noted.   Studies Reviewed: Dawn    EKG:       Physical Exam:   VS:  BP (!) 144/82 (BP Location: Left Arm, Cuff Size: Large)   Pulse 77   Ht 5' 2.5 (1.588 m)   Wt 175 lb 3.2 oz (79.5 kg)   LMP 12/24/2015   SpO2 100%   BMI 31.53 kg/m    Wt Readings from  Last 3 Encounters:  03/21/24 175 lb 3.2 oz (79.5 kg)  01/06/24 187 lb (84.8 kg)  11/09/23 202 lb 9.6 oz (91.9 kg)    GEN: Well nourished, well developed in no acute distress HEENT: Normal, moist mucous membranes NECK: No JVD CARDIAC: regular rhythm, normal S1 and S2, no rubs or gallops. No murmur. VASCULAR: Radial and DP pulses 2+ bilaterally. No carotid bruits RESPIRATORY:  Clear to auscultation without rales, wheezing or rhonchi  ABDOMEN: Soft, non-tender, non-distended MUSCULOSKELETAL:  Ambulates independently SKIN: Warm and dry, no edema NEUROLOGIC:  Alert and oriented x 3. No focal neuro deficits noted. PSYCHIATRIC:  Normal affect    ASSESSMENT AND PLAN: .    Exercise intolerance Dyspnea on exertion Fatigue -workup from a cardiac standpoint has been unremarkable. -symptoms much improved with weight loss  Hyperkalemia, history of  Chronic kidney disease stage 3b-4 -following with Dr. Norine at [facility]  -no longer on lokelma  BID, has just PRN  Paroxysmal atrial fibrillation CHA2DS2/VAS Stroke Risk Points= 3  -on diltiazem , flecainide  -on rivaroxaban  for long term anticoagulation -stress test normal  Hypercholesterolemia:  -tolerating atorvastatin  40 mg daily   Type II diabetes Morbid obesity -BMI 39-> 31, starting weight 236 lbs, today 175 lbs -doing well on Mounjaro  -no longer taking farxiga  due to cost -on atorvastatin  -no aspirin as she is on rivaroxaban   Hypertension:  -now off of medications as blood pressure has been running much lower with  weight loss -on diltiazem  for rate control as well as BP  CV risk counseling and prevention -recommend heart healthy/Mediterranean diet, with whole grains, fruits, vegetable, fish, lean meats, nuts, and olive oil. Limit salt. -recommend moderate walking, 3-5 times/week for 30-50 minutes each session. Aim for at least 150 minutes.week. Goal should be pace of 3 miles/hours, or walking 1.5 miles in 30  minutes -recommend avoidance of tobacco products. Avoid excess alcohol.  Dispo: 6 mos or sooner PRN  Asking about PCP closer to [facility] office. Will refer her to Dr. Wendolyn at [facility]  Signed, Shelda Bruckner, MD   Shelda Bruckner, MD, PhD, [facility]  [facility]  [place]  [facility] at [facility] [address] [place], [place] [address] [phone]

## 2024-04-11 ENCOUNTER — Telehealth: Payer: Self-pay | Admitting: Internal Medicine

## 2024-04-11 NOTE — Telephone Encounter (Signed)
 Dawn Braun to callback and schedule new patient appointment.

## 2024-05-10 ENCOUNTER — Other Ambulatory Visit (HOSPITAL_BASED_OUTPATIENT_CLINIC_OR_DEPARTMENT_OTHER): Payer: Self-pay | Admitting: Cardiology
